# Patient Record
Sex: Female | Born: 1940
Health system: Southern US, Community
[De-identification: ages and names within clinical notes are randomized; demographics above are authoritative.]

## PROBLEM LIST (undated history)

## (undated) DIAGNOSIS — M549 Dorsalgia, unspecified: Secondary | ICD-10-CM

## (undated) DIAGNOSIS — R590 Localized enlarged lymph nodes: Secondary | ICD-10-CM

## (undated) DIAGNOSIS — M329 Systemic lupus erythematosus, unspecified: Secondary | ICD-10-CM

## (undated) DIAGNOSIS — I10 Essential (primary) hypertension: Secondary | ICD-10-CM

## (undated) DIAGNOSIS — IMO0002 Reserved for concepts with insufficient information to code with codable children: Secondary | ICD-10-CM

## (undated) DIAGNOSIS — I4891 Unspecified atrial fibrillation: Secondary | ICD-10-CM

## (undated) DIAGNOSIS — G8929 Other chronic pain: Secondary | ICD-10-CM

## (undated) DIAGNOSIS — J302 Other seasonal allergic rhinitis: Secondary | ICD-10-CM

## (undated) DIAGNOSIS — D591 Autoimmune hemolytic anemia, unspecified: Secondary | ICD-10-CM

## (undated) DIAGNOSIS — E785 Hyperlipidemia, unspecified: Secondary | ICD-10-CM

## (undated) DIAGNOSIS — F329 Major depressive disorder, single episode, unspecified: Secondary | ICD-10-CM

## (undated) DIAGNOSIS — I340 Nonrheumatic mitral (valve) insufficiency: Secondary | ICD-10-CM

## (undated) HISTORY — DX: Other autoimmune hemolytic anemias: D59.1

## (undated) HISTORY — DX: Major depressive disorder, single episode, unspecified: F32.9

## (undated) HISTORY — DX: Systemic lupus erythematosus, unspecified: M32.9

## (undated) HISTORY — DX: Dorsalgia, unspecified: M54.9

## (undated) HISTORY — DX: Unspecified atrial fibrillation: I48.91

## (undated) HISTORY — PX: EYE SURGERY: SHX253

## (undated) HISTORY — DX: Nonrheumatic mitral (valve) insufficiency: I34.0

## (undated) HISTORY — DX: Other chronic pain: G89.29

## (undated) HISTORY — DX: Autoimmune hemolytic anemia, unspecified: D59.10

## (undated) HISTORY — DX: Localized enlarged lymph nodes: R59.0

## (undated) HISTORY — DX: Reserved for concepts with insufficient information to code with codable children: IMO0002

## (undated) HISTORY — DX: Other seasonal allergic rhinitis: J30.2

## (undated) HISTORY — DX: Essential (primary) hypertension: I10

## (undated) HISTORY — DX: Hyperlipidemia, unspecified: E78.5

---

## 1999-01-22 ENCOUNTER — Emergency Department (HOSPITAL_COMMUNITY): Admission: EM | Admit: 1999-01-22 | Discharge: 1999-01-22 | Payer: Self-pay | Admitting: Emergency Medicine

## 1999-02-19 ENCOUNTER — Encounter: Admission: RE | Admit: 1999-02-19 | Discharge: 1999-02-19 | Payer: Self-pay | Admitting: Internal Medicine

## 1999-03-02 ENCOUNTER — Ambulatory Visit (HOSPITAL_COMMUNITY): Admission: RE | Admit: 1999-03-02 | Discharge: 1999-03-02 | Payer: Self-pay | Admitting: *Deleted

## 1999-03-19 ENCOUNTER — Encounter: Admission: RE | Admit: 1999-03-19 | Discharge: 1999-03-19 | Payer: Self-pay | Admitting: Internal Medicine

## 1999-04-05 ENCOUNTER — Encounter: Admission: RE | Admit: 1999-04-05 | Discharge: 1999-04-05 | Payer: Self-pay | Admitting: Internal Medicine

## 1999-08-16 ENCOUNTER — Encounter: Admission: RE | Admit: 1999-08-16 | Discharge: 1999-08-16 | Payer: Self-pay | Admitting: Internal Medicine

## 2000-01-10 ENCOUNTER — Encounter: Payer: Self-pay | Admitting: Internal Medicine

## 2000-01-10 ENCOUNTER — Ambulatory Visit (HOSPITAL_COMMUNITY): Admission: RE | Admit: 2000-01-10 | Discharge: 2000-01-10 | Payer: Self-pay | Admitting: Internal Medicine

## 2000-01-10 ENCOUNTER — Encounter: Admission: RE | Admit: 2000-01-10 | Discharge: 2000-01-10 | Payer: Self-pay | Admitting: Internal Medicine

## 2000-01-22 ENCOUNTER — Ambulatory Visit (HOSPITAL_COMMUNITY): Admission: RE | Admit: 2000-01-22 | Discharge: 2000-01-22 | Payer: Self-pay | Admitting: *Deleted

## 2000-01-26 ENCOUNTER — Encounter: Admission: RE | Admit: 2000-01-26 | Discharge: 2000-01-26 | Payer: Self-pay | Admitting: Internal Medicine

## 2000-08-16 ENCOUNTER — Encounter: Admission: RE | Admit: 2000-08-16 | Discharge: 2000-08-16 | Payer: Self-pay | Admitting: Internal Medicine

## 2000-09-19 ENCOUNTER — Encounter: Admission: RE | Admit: 2000-09-19 | Discharge: 2000-09-19 | Payer: Self-pay | Admitting: Internal Medicine

## 2000-09-30 ENCOUNTER — Encounter: Payer: Self-pay | Admitting: Internal Medicine

## 2000-09-30 ENCOUNTER — Ambulatory Visit (HOSPITAL_COMMUNITY): Admission: RE | Admit: 2000-09-30 | Discharge: 2000-09-30 | Payer: Self-pay | Admitting: Internal Medicine

## 2000-11-01 ENCOUNTER — Inpatient Hospital Stay (HOSPITAL_COMMUNITY): Admission: EM | Admit: 2000-11-01 | Discharge: 2000-11-03 | Payer: Self-pay | Admitting: Emergency Medicine

## 2000-11-01 ENCOUNTER — Encounter: Payer: Self-pay | Admitting: Emergency Medicine

## 2000-11-02 ENCOUNTER — Encounter: Payer: Self-pay | Admitting: Infectious Diseases

## 2000-11-03 ENCOUNTER — Encounter: Payer: Self-pay | Admitting: Infectious Diseases

## 2000-12-05 ENCOUNTER — Encounter: Admission: RE | Admit: 2000-12-05 | Discharge: 2000-12-05 | Payer: Self-pay | Admitting: Internal Medicine

## 2001-01-30 ENCOUNTER — Encounter: Admission: RE | Admit: 2001-01-30 | Discharge: 2001-01-30 | Payer: Self-pay | Admitting: Internal Medicine

## 2001-03-23 ENCOUNTER — Ambulatory Visit (HOSPITAL_COMMUNITY): Admission: RE | Admit: 2001-03-23 | Discharge: 2001-03-23 | Payer: Self-pay | Admitting: Internal Medicine

## 2001-04-19 ENCOUNTER — Encounter: Admission: RE | Admit: 2001-04-19 | Discharge: 2001-04-19 | Payer: Self-pay | Admitting: Internal Medicine

## 2001-07-10 ENCOUNTER — Encounter: Admission: RE | Admit: 2001-07-10 | Discharge: 2001-07-10 | Payer: Self-pay | Admitting: Internal Medicine

## 2001-08-21 ENCOUNTER — Encounter: Admission: RE | Admit: 2001-08-21 | Discharge: 2001-08-21 | Payer: Self-pay

## 2001-12-11 ENCOUNTER — Encounter: Admission: RE | Admit: 2001-12-11 | Discharge: 2001-12-11 | Payer: Self-pay | Admitting: Internal Medicine

## 2002-03-12 ENCOUNTER — Encounter: Admission: RE | Admit: 2002-03-12 | Discharge: 2002-03-12 | Payer: Self-pay | Admitting: Internal Medicine

## 2002-04-10 ENCOUNTER — Ambulatory Visit (HOSPITAL_COMMUNITY): Admission: RE | Admit: 2002-04-10 | Discharge: 2002-04-10 | Payer: Self-pay | Admitting: Internal Medicine

## 2002-04-10 ENCOUNTER — Encounter: Payer: Self-pay | Admitting: Cardiology

## 2002-04-11 ENCOUNTER — Encounter: Admission: RE | Admit: 2002-04-11 | Discharge: 2002-04-11 | Payer: Self-pay | Admitting: Obstetrics and Gynecology

## 2002-04-17 ENCOUNTER — Ambulatory Visit (HOSPITAL_COMMUNITY): Admission: RE | Admit: 2002-04-17 | Discharge: 2002-04-17 | Payer: Self-pay | Admitting: Obstetrics and Gynecology

## 2002-07-19 ENCOUNTER — Encounter: Admission: RE | Admit: 2002-07-19 | Discharge: 2002-07-19 | Payer: Self-pay | Admitting: Internal Medicine

## 2002-08-21 ENCOUNTER — Encounter: Admission: RE | Admit: 2002-08-21 | Discharge: 2002-08-21 | Payer: Self-pay | Admitting: Internal Medicine

## 2003-05-08 ENCOUNTER — Encounter: Admission: RE | Admit: 2003-05-08 | Discharge: 2003-05-08 | Payer: Self-pay | Admitting: Internal Medicine

## 2003-05-15 ENCOUNTER — Encounter: Admission: RE | Admit: 2003-05-15 | Discharge: 2003-05-15 | Payer: Self-pay | Admitting: Internal Medicine

## 2003-06-04 ENCOUNTER — Ambulatory Visit (HOSPITAL_COMMUNITY): Admission: RE | Admit: 2003-06-04 | Discharge: 2003-06-04 | Payer: Self-pay | Admitting: *Deleted

## 2003-06-04 ENCOUNTER — Encounter: Admission: RE | Admit: 2003-06-04 | Discharge: 2003-06-04 | Payer: Self-pay | Admitting: Internal Medicine

## 2003-06-12 ENCOUNTER — Encounter: Admission: RE | Admit: 2003-06-12 | Discharge: 2003-06-12 | Payer: Self-pay | Admitting: Internal Medicine

## 2003-06-19 ENCOUNTER — Ambulatory Visit (HOSPITAL_COMMUNITY): Admission: RE | Admit: 2003-06-19 | Discharge: 2003-06-19 | Payer: Self-pay | Admitting: *Deleted

## 2003-06-19 ENCOUNTER — Encounter: Payer: Self-pay | Admitting: Cardiology

## 2003-06-20 ENCOUNTER — Ambulatory Visit (HOSPITAL_COMMUNITY): Admission: RE | Admit: 2003-06-20 | Discharge: 2003-06-20 | Payer: Self-pay | Admitting: *Deleted

## 2003-08-11 ENCOUNTER — Emergency Department (HOSPITAL_COMMUNITY): Admission: EM | Admit: 2003-08-11 | Discharge: 2003-08-11 | Payer: Self-pay | Admitting: Emergency Medicine

## 2003-08-13 ENCOUNTER — Encounter: Admission: RE | Admit: 2003-08-13 | Discharge: 2003-08-13 | Payer: Self-pay | Admitting: Internal Medicine

## 2003-09-11 ENCOUNTER — Encounter: Admission: RE | Admit: 2003-09-11 | Discharge: 2003-09-11 | Payer: Self-pay | Admitting: Internal Medicine

## 2003-09-11 ENCOUNTER — Ambulatory Visit (HOSPITAL_COMMUNITY): Admission: RE | Admit: 2003-09-11 | Discharge: 2003-09-11 | Payer: Self-pay | Admitting: Internal Medicine

## 2003-12-11 ENCOUNTER — Encounter: Admission: RE | Admit: 2003-12-11 | Discharge: 2003-12-11 | Payer: Self-pay | Admitting: Internal Medicine

## 2004-02-10 ENCOUNTER — Encounter: Admission: RE | Admit: 2004-02-10 | Discharge: 2004-02-10 | Payer: Self-pay | Admitting: Internal Medicine

## 2004-03-18 ENCOUNTER — Encounter: Admission: RE | Admit: 2004-03-18 | Discharge: 2004-03-18 | Payer: Self-pay | Admitting: Internal Medicine

## 2004-06-16 ENCOUNTER — Ambulatory Visit: Payer: Self-pay | Admitting: Internal Medicine

## 2004-06-21 ENCOUNTER — Ambulatory Visit (HOSPITAL_COMMUNITY): Admission: RE | Admit: 2004-06-21 | Discharge: 2004-06-21 | Payer: Self-pay | Admitting: Internal Medicine

## 2004-08-25 ENCOUNTER — Ambulatory Visit: Payer: Self-pay | Admitting: Internal Medicine

## 2004-10-27 ENCOUNTER — Ambulatory Visit: Payer: Self-pay | Admitting: Internal Medicine

## 2004-11-11 ENCOUNTER — Ambulatory Visit: Payer: Self-pay | Admitting: Internal Medicine

## 2005-02-24 ENCOUNTER — Ambulatory Visit (HOSPITAL_COMMUNITY): Admission: RE | Admit: 2005-02-24 | Discharge: 2005-02-24 | Payer: Self-pay | Admitting: Internal Medicine

## 2005-05-26 ENCOUNTER — Ambulatory Visit: Payer: Self-pay | Admitting: Internal Medicine

## 2005-06-01 ENCOUNTER — Ambulatory Visit: Payer: Self-pay | Admitting: Internal Medicine

## 2005-07-12 ENCOUNTER — Ambulatory Visit: Payer: Self-pay | Admitting: Hospitalist

## 2005-11-16 ENCOUNTER — Ambulatory Visit: Payer: Self-pay | Admitting: Internal Medicine

## 2005-11-30 ENCOUNTER — Ambulatory Visit: Payer: Self-pay | Admitting: Internal Medicine

## 2006-02-06 ENCOUNTER — Ambulatory Visit: Payer: Self-pay | Admitting: Internal Medicine

## 2006-05-03 ENCOUNTER — Ambulatory Visit: Payer: Self-pay | Admitting: Internal Medicine

## 2006-05-09 ENCOUNTER — Ambulatory Visit (HOSPITAL_COMMUNITY): Admission: RE | Admit: 2006-05-09 | Discharge: 2006-05-09 | Payer: Self-pay | Admitting: Internal Medicine

## 2006-05-09 ENCOUNTER — Ambulatory Visit: Payer: Self-pay | Admitting: Hospitalist

## 2006-05-16 ENCOUNTER — Encounter (INDEPENDENT_AMBULATORY_CARE_PROVIDER_SITE_OTHER): Payer: Self-pay | Admitting: Cardiology

## 2006-05-16 ENCOUNTER — Ambulatory Visit (HOSPITAL_COMMUNITY): Admission: RE | Admit: 2006-05-16 | Discharge: 2006-05-16 | Payer: Self-pay | Admitting: Cardiology

## 2006-05-31 ENCOUNTER — Ambulatory Visit (HOSPITAL_COMMUNITY): Admission: RE | Admit: 2006-05-31 | Discharge: 2006-05-31 | Payer: Self-pay | Admitting: Hospitalist

## 2006-05-31 ENCOUNTER — Ambulatory Visit: Payer: Self-pay | Admitting: Hospitalist

## 2006-06-07 ENCOUNTER — Ambulatory Visit: Payer: Self-pay | Admitting: Internal Medicine

## 2006-06-21 ENCOUNTER — Ambulatory Visit: Payer: Self-pay | Admitting: *Deleted

## 2006-06-22 ENCOUNTER — Ambulatory Visit: Payer: Self-pay | Admitting: *Deleted

## 2006-06-22 ENCOUNTER — Ambulatory Visit: Payer: Self-pay | Admitting: Cardiology

## 2006-07-03 ENCOUNTER — Ambulatory Visit: Payer: Self-pay | Admitting: *Deleted

## 2006-07-03 ENCOUNTER — Ambulatory Visit: Payer: Self-pay | Admitting: Cardiology

## 2006-07-10 ENCOUNTER — Ambulatory Visit: Payer: Self-pay | Admitting: Cardiovascular Disease

## 2006-07-17 ENCOUNTER — Ambulatory Visit: Payer: Self-pay | Admitting: Cardiology

## 2006-07-20 ENCOUNTER — Ambulatory Visit: Payer: Self-pay | Admitting: *Deleted

## 2006-07-24 ENCOUNTER — Ambulatory Visit: Payer: Self-pay | Admitting: Internal Medicine

## 2006-07-24 ENCOUNTER — Ambulatory Visit: Payer: Self-pay | Admitting: Cardiovascular Disease

## 2006-08-02 DIAGNOSIS — E1169 Type 2 diabetes mellitus with other specified complication: Secondary | ICD-10-CM | POA: Insufficient documentation

## 2006-08-02 DIAGNOSIS — M329 Systemic lupus erythematosus, unspecified: Secondary | ICD-10-CM

## 2006-08-02 DIAGNOSIS — E785 Hyperlipidemia, unspecified: Secondary | ICD-10-CM

## 2006-08-02 DIAGNOSIS — M545 Low back pain: Secondary | ICD-10-CM

## 2006-08-02 DIAGNOSIS — I10 Essential (primary) hypertension: Secondary | ICD-10-CM | POA: Insufficient documentation

## 2006-08-02 DIAGNOSIS — IMO0002 Reserved for concepts with insufficient information to code with codable children: Secondary | ICD-10-CM | POA: Insufficient documentation

## 2006-08-09 ENCOUNTER — Ambulatory Visit: Payer: Self-pay | Admitting: *Deleted

## 2006-08-09 ENCOUNTER — Ambulatory Visit: Payer: Self-pay | Admitting: Cardiology

## 2006-08-25 ENCOUNTER — Ambulatory Visit: Payer: Self-pay | Admitting: Cardiology

## 2006-08-30 ENCOUNTER — Ambulatory Visit: Payer: Self-pay | Admitting: Internal Medicine

## 2006-08-31 ENCOUNTER — Ambulatory Visit (HOSPITAL_COMMUNITY): Admission: RE | Admit: 2006-08-31 | Discharge: 2006-08-31 | Payer: Self-pay | Admitting: Internal Medicine

## 2006-08-31 ENCOUNTER — Encounter (INDEPENDENT_AMBULATORY_CARE_PROVIDER_SITE_OTHER): Payer: Self-pay | Admitting: *Deleted

## 2006-09-01 ENCOUNTER — Ambulatory Visit: Payer: Self-pay | Admitting: Cardiology

## 2006-09-12 ENCOUNTER — Encounter (INDEPENDENT_AMBULATORY_CARE_PROVIDER_SITE_OTHER): Payer: Self-pay | Admitting: Internal Medicine

## 2006-09-12 ENCOUNTER — Ambulatory Visit: Payer: Self-pay | Admitting: *Deleted

## 2006-09-14 ENCOUNTER — Ambulatory Visit: Payer: Self-pay | Admitting: Internal Medicine

## 2006-09-14 ENCOUNTER — Encounter (INDEPENDENT_AMBULATORY_CARE_PROVIDER_SITE_OTHER): Payer: Self-pay | Admitting: Internal Medicine

## 2006-09-14 LAB — CONVERTED CEMR LAB
ALT: 18 units/L (ref 0–35)
Albumin: 4.1 g/dL (ref 3.5–5.2)
Alkaline Phosphatase: 43 units/L (ref 39–117)
BUN: 10 mg/dL (ref 6–23)
CO2: 28 meq/L (ref 19–32)
Glucose, Bld: 129 mg/dL — ABNORMAL HIGH (ref 70–99)
HCT: 40.7 % (ref 34.4–43.3)
MCHC: 33.6 g/dL (ref 33.1–35.4)
MCV: 94.5 fL (ref 78.8–100.0)
RBC: 4.31 M/uL (ref 3.79–4.96)
Total Bilirubin: 0.7 mg/dL (ref 0.3–1.2)
Total Protein: 7 g/dL (ref 6.0–8.3)
WBC: 3.4 10*3/uL — ABNORMAL LOW (ref 3.7–10.0)

## 2006-09-15 ENCOUNTER — Ambulatory Visit: Payer: Self-pay | Admitting: Internal Medicine

## 2006-09-21 ENCOUNTER — Ambulatory Visit: Payer: Self-pay | Admitting: Cardiology

## 2006-10-02 ENCOUNTER — Ambulatory Visit: Payer: Self-pay | Admitting: Cardiovascular Disease

## 2006-10-10 ENCOUNTER — Encounter (INDEPENDENT_AMBULATORY_CARE_PROVIDER_SITE_OTHER): Payer: Self-pay | Admitting: Internal Medicine

## 2006-10-10 ENCOUNTER — Ambulatory Visit: Payer: Self-pay | Admitting: Internal Medicine

## 2006-10-13 ENCOUNTER — Encounter: Admission: RE | Admit: 2006-10-13 | Discharge: 2006-10-13 | Payer: Self-pay | Admitting: Internal Medicine

## 2006-10-23 ENCOUNTER — Ambulatory Visit: Payer: Self-pay | Admitting: Cardiology

## 2006-10-23 ENCOUNTER — Telehealth: Payer: Self-pay | Admitting: *Deleted

## 2006-10-24 DIAGNOSIS — I340 Nonrheumatic mitral (valve) insufficiency: Secondary | ICD-10-CM

## 2006-10-24 DIAGNOSIS — I4891 Unspecified atrial fibrillation: Secondary | ICD-10-CM | POA: Insufficient documentation

## 2006-10-24 DIAGNOSIS — I059 Rheumatic mitral valve disease, unspecified: Secondary | ICD-10-CM | POA: Insufficient documentation

## 2006-10-24 HISTORY — DX: Nonrheumatic mitral (valve) insufficiency: I34.0

## 2006-11-02 ENCOUNTER — Ambulatory Visit: Payer: Self-pay | Admitting: Cardiology

## 2006-11-07 ENCOUNTER — Encounter (INDEPENDENT_AMBULATORY_CARE_PROVIDER_SITE_OTHER): Payer: Self-pay | Admitting: Internal Medicine

## 2006-11-13 ENCOUNTER — Ambulatory Visit: Payer: Self-pay | Admitting: Cardiology

## 2006-12-11 ENCOUNTER — Telehealth (INDEPENDENT_AMBULATORY_CARE_PROVIDER_SITE_OTHER): Payer: Self-pay | Admitting: Internal Medicine

## 2006-12-20 ENCOUNTER — Ambulatory Visit: Payer: Self-pay | Admitting: Cardiovascular Disease

## 2006-12-27 ENCOUNTER — Ambulatory Visit: Payer: Self-pay | Admitting: Cardiovascular Disease

## 2007-01-04 ENCOUNTER — Ambulatory Visit: Payer: Self-pay | Admitting: *Deleted

## 2007-01-04 DIAGNOSIS — R609 Edema, unspecified: Secondary | ICD-10-CM

## 2007-01-08 ENCOUNTER — Ambulatory Visit: Payer: Self-pay | Admitting: Cardiology

## 2007-01-15 ENCOUNTER — Ambulatory Visit: Payer: Self-pay | Admitting: Cardiology

## 2007-01-26 ENCOUNTER — Ambulatory Visit: Payer: Self-pay | Admitting: Cardiology

## 2007-02-06 ENCOUNTER — Ambulatory Visit: Payer: Self-pay | Admitting: Cardiology

## 2007-03-01 ENCOUNTER — Encounter (INDEPENDENT_AMBULATORY_CARE_PROVIDER_SITE_OTHER): Payer: Self-pay | Admitting: Internal Medicine

## 2007-03-01 ENCOUNTER — Ambulatory Visit: Payer: Self-pay | Admitting: *Deleted

## 2007-07-06 ENCOUNTER — Ambulatory Visit: Payer: Self-pay | Admitting: Hospitalist

## 2007-07-06 ENCOUNTER — Encounter (INDEPENDENT_AMBULATORY_CARE_PROVIDER_SITE_OTHER): Payer: Self-pay | Admitting: Internal Medicine

## 2007-07-06 LAB — CONVERTED CEMR LAB
AST: 24 units/L (ref 0–37)
Albumin: 4.1 g/dL (ref 3.5–5.2)
Basophils Absolute: 0 10*3/uL (ref 0.0–0.1)
CO2: 26 meq/L (ref 19–32)
Chloride: 109 meq/L (ref 96–112)
INR: 1.1
Lymphs Abs: 1 10*3/uL (ref 0.7–3.3)
MCV: 94.7 fL (ref 78.0–100.0)
Monocytes Absolute: 0.2 10*3/uL (ref 0.2–0.7)
Monocytes Relative: 6 % (ref 3–11)
Neutro Abs: 2.3 10*3/uL (ref 1.7–7.7)
Potassium: 3.9 meq/L (ref 3.5–5.3)
RBC: 4.71 M/uL (ref 3.87–5.11)
Total Bilirubin: 0.5 mg/dL (ref 0.3–1.2)

## 2007-07-09 ENCOUNTER — Ambulatory Visit (HOSPITAL_COMMUNITY): Admission: RE | Admit: 2007-07-09 | Discharge: 2007-07-09 | Payer: Self-pay | Admitting: Hospitalist

## 2007-07-11 ENCOUNTER — Ambulatory Visit: Payer: Self-pay | Admitting: Internal Medicine

## 2007-07-20 ENCOUNTER — Encounter (INDEPENDENT_AMBULATORY_CARE_PROVIDER_SITE_OTHER): Payer: Self-pay | Admitting: Internal Medicine

## 2007-07-20 ENCOUNTER — Ambulatory Visit: Payer: Self-pay | Admitting: *Deleted

## 2007-07-24 LAB — CONVERTED CEMR LAB
Cholesterol: 207 mg/dL — ABNORMAL HIGH (ref 0–200)
LDL Cholesterol: 136 mg/dL — ABNORMAL HIGH (ref 0–99)
VLDL: 24 mg/dL (ref 0–40)

## 2007-09-07 ENCOUNTER — Ambulatory Visit: Payer: Self-pay | Admitting: Cardiology

## 2007-09-19 ENCOUNTER — Ambulatory Visit: Payer: Self-pay | Admitting: Internal Medicine

## 2007-10-03 ENCOUNTER — Ambulatory Visit: Payer: Self-pay | Admitting: Oncology

## 2007-10-03 ENCOUNTER — Inpatient Hospital Stay (HOSPITAL_COMMUNITY): Admission: EM | Admit: 2007-10-03 | Discharge: 2007-10-08 | Payer: Self-pay | Admitting: Emergency Medicine

## 2007-10-03 ENCOUNTER — Ambulatory Visit: Payer: Self-pay | Admitting: Internal Medicine

## 2007-10-10 ENCOUNTER — Ambulatory Visit: Payer: Self-pay | Admitting: Cardiovascular Disease

## 2007-10-12 ENCOUNTER — Ambulatory Visit: Payer: Self-pay | Admitting: Infectious Disease

## 2007-10-12 DIAGNOSIS — Z862 Personal history of diseases of the blood and blood-forming organs and certain disorders involving the immune mechanism: Secondary | ICD-10-CM

## 2007-10-15 ENCOUNTER — Encounter (INDEPENDENT_AMBULATORY_CARE_PROVIDER_SITE_OTHER): Payer: Self-pay | Admitting: Internal Medicine

## 2007-10-16 ENCOUNTER — Ambulatory Visit: Payer: Self-pay | Admitting: Cardiovascular Disease

## 2007-10-16 LAB — CONVERTED CEMR LAB
BUN: 16 mg/dL (ref 6–23)
CO2: 30 meq/L (ref 19–32)
GFR calc Af Amer: 92 mL/min
Glucose, Bld: 199 mg/dL — ABNORMAL HIGH (ref 70–99)
Potassium: 3.9 meq/L (ref 3.5–5.1)
Sodium: 142 meq/L (ref 135–145)

## 2007-10-17 ENCOUNTER — Ambulatory Visit: Payer: Self-pay | Admitting: Oncology

## 2007-10-24 ENCOUNTER — Ambulatory Visit: Payer: Self-pay | Admitting: Cardiovascular Disease

## 2007-10-24 ENCOUNTER — Encounter (INDEPENDENT_AMBULATORY_CARE_PROVIDER_SITE_OTHER): Payer: Self-pay | Admitting: Internal Medicine

## 2007-10-24 LAB — BILIRUBIN, DIRECT: Bilirubin, Direct: 0.2 mg/dL (ref 0.0–0.3)

## 2007-10-24 LAB — CBC & DIFF AND RETIC
BASO%: 0.1 % (ref 0.0–2.0)
HCT: 47.6 % — ABNORMAL HIGH (ref 34.8–46.6)
LYMPH%: 3.2 % — ABNORMAL LOW (ref 14.0–48.0)
MCHC: 33.3 g/dL (ref 32.0–36.0)
MCV: 97.7 fL (ref 81.0–101.0)
MONO#: 0.4 10*3/uL (ref 0.1–0.9)
MONO%: 3.2 % (ref 0.0–13.0)
NEUT%: 93.4 % — ABNORMAL HIGH (ref 39.6–76.8)
Platelets: 208 10*3/uL (ref 145–400)
WBC: 13.4 10*3/uL — ABNORMAL HIGH (ref 3.9–10.0)

## 2007-10-24 LAB — COMPREHENSIVE METABOLIC PANEL
AST: 15 U/L (ref 0–37)
Alkaline Phosphatase: 59 U/L (ref 39–117)
BUN: 23 mg/dL (ref 6–23)
Creatinine, Ser: 0.9 mg/dL (ref 0.40–1.20)
Glucose, Bld: 204 mg/dL — ABNORMAL HIGH (ref 70–99)
Potassium: 4.3 mEq/L (ref 3.5–5.3)
Total Bilirubin: 1.1 mg/dL (ref 0.3–1.2)

## 2007-10-24 LAB — CONVERTED CEMR LAB: Prothrombin Time: 26.3 s — ABNORMAL HIGH (ref 10.9–13.3)

## 2007-10-24 LAB — MORPHOLOGY: RBC Comments: NORMAL

## 2007-11-15 ENCOUNTER — Ambulatory Visit: Payer: Self-pay | Admitting: Internal Medicine

## 2007-11-15 ENCOUNTER — Encounter (INDEPENDENT_AMBULATORY_CARE_PROVIDER_SITE_OTHER): Payer: Self-pay | Admitting: *Deleted

## 2007-11-16 LAB — CONVERTED CEMR LAB
BUN: 8 mg/dL (ref 6–23)
Basophils Relative: 0 % (ref 0–1)
Creatinine, Ser: 0.63 mg/dL (ref 0.40–1.20)
Eosinophils Absolute: 0 10*3/uL (ref 0.0–0.7)
Eosinophils Relative: 1 % (ref 0–5)
Glucose, Bld: 119 mg/dL — ABNORMAL HIGH (ref 70–99)
HCT: 29.2 % — ABNORMAL LOW (ref 36.0–46.0)
Hemoglobin: 9.2 g/dL — ABNORMAL LOW (ref 12.0–15.0)
Lymphs Abs: 0.7 10*3/uL (ref 0.7–4.0)
MCHC: 31.5 g/dL (ref 30.0–36.0)
MCV: 108.1 fL — ABNORMAL HIGH (ref 78.0–100.0)
Monocytes Absolute: 0.4 10*3/uL (ref 0.1–1.0)
Monocytes Relative: 7 % (ref 3–12)
RBC: 2.7 M/uL — ABNORMAL LOW (ref 3.87–5.11)
WBC: 5.2 10*3/uL (ref 4.0–10.5)

## 2007-11-20 ENCOUNTER — Ambulatory Visit: Payer: Self-pay | Admitting: Cardiology

## 2007-11-21 ENCOUNTER — Encounter (INDEPENDENT_AMBULATORY_CARE_PROVIDER_SITE_OTHER): Payer: Self-pay | Admitting: Internal Medicine

## 2007-11-29 ENCOUNTER — Ambulatory Visit: Payer: Self-pay | Admitting: Cardiology

## 2007-12-18 ENCOUNTER — Ambulatory Visit: Payer: Self-pay | Admitting: Infectious Diseases

## 2007-12-18 ENCOUNTER — Encounter (INDEPENDENT_AMBULATORY_CARE_PROVIDER_SITE_OTHER): Payer: Self-pay | Admitting: Internal Medicine

## 2007-12-19 ENCOUNTER — Telehealth: Payer: Self-pay | Admitting: *Deleted

## 2007-12-19 ENCOUNTER — Encounter (INDEPENDENT_AMBULATORY_CARE_PROVIDER_SITE_OTHER): Payer: Self-pay | Admitting: Internal Medicine

## 2007-12-19 LAB — CONVERTED CEMR LAB
CO2: 27 meq/L (ref 19–32)
Calcium: 9.8 mg/dL (ref 8.4–10.5)
Glucose, Bld: 159 mg/dL — ABNORMAL HIGH (ref 70–99)
MCV: 104.7 fL — ABNORMAL HIGH (ref 78.0–100.0)
Platelets: 233 10*3/uL (ref 150–400)
Potassium: 4 meq/L (ref 3.5–5.3)
RBC: 3.64 M/uL — ABNORMAL LOW (ref 3.87–5.11)
Sodium: 143 meq/L (ref 135–145)
WBC: 7 10*3/uL (ref 4.0–10.5)

## 2007-12-24 ENCOUNTER — Ambulatory Visit: Payer: Self-pay | Admitting: Hospitalist

## 2007-12-24 LAB — CONVERTED CEMR LAB: INR: 2

## 2007-12-31 ENCOUNTER — Ambulatory Visit: Payer: Self-pay | Admitting: Internal Medicine

## 2007-12-31 LAB — CONVERTED CEMR LAB: INR: 7.8

## 2008-01-07 ENCOUNTER — Ambulatory Visit: Payer: Self-pay | Admitting: Internal Medicine

## 2008-01-07 LAB — CONVERTED CEMR LAB: INR: 2.1

## 2008-01-15 ENCOUNTER — Encounter (INDEPENDENT_AMBULATORY_CARE_PROVIDER_SITE_OTHER): Payer: Self-pay | Admitting: Internal Medicine

## 2008-01-15 ENCOUNTER — Ambulatory Visit: Payer: Self-pay | Admitting: Infectious Disease

## 2008-01-21 ENCOUNTER — Ambulatory Visit: Payer: Self-pay | Admitting: Internal Medicine

## 2008-01-21 ENCOUNTER — Encounter (INDEPENDENT_AMBULATORY_CARE_PROVIDER_SITE_OTHER): Payer: Self-pay | Admitting: Internal Medicine

## 2008-01-21 LAB — CONVERTED CEMR LAB
HDL: 60 mg/dL (ref >39)
LDL Cholesterol: 102 mg/dL — ABNORMAL HIGH (ref 0–99)

## 2008-01-22 ENCOUNTER — Encounter (INDEPENDENT_AMBULATORY_CARE_PROVIDER_SITE_OTHER): Payer: Self-pay | Admitting: *Deleted

## 2008-01-22 ENCOUNTER — Ambulatory Visit: Payer: Self-pay | Admitting: Hospitalist

## 2008-01-22 LAB — CONVERTED CEMR LAB
AST: 25 units/L (ref 0–37)
Albumin: 4.1 g/dL (ref 3.5–5.2)
BUN: 9 mg/dL (ref 6–23)
CO2: 25 meq/L (ref 19–32)
Calcium: 9 mg/dL (ref 8.4–10.5)
Chloride: 98 meq/L (ref 96–112)
Creatinine, Ser: 0.77 mg/dL (ref 0.40–1.20)
Glucose, Bld: 179 mg/dL — ABNORMAL HIGH (ref 70–99)
Hemoglobin: 13 g/dL (ref 12.0–15.0)
Lipase: 15 units/L (ref 11–59)
Lymphocytes Relative: 6 % — ABNORMAL LOW (ref 12–46)
Lymphs Abs: 0.8 10*3/uL (ref 0.7–4.0)
Monocytes Absolute: 0.6 10*3/uL (ref 0.1–1.0)
Monocytes Relative: 5 % (ref 3–12)
Neutro Abs: 12.1 10*3/uL — ABNORMAL HIGH (ref 1.7–7.7)
Neutrophils Relative %: 89 % — ABNORMAL HIGH (ref 43–77)
Potassium: 4.3 meq/L (ref 3.5–5.3)
RBC: 4.11 M/uL (ref 3.87–5.11)
WBC: 13.5 10*3/uL — ABNORMAL HIGH (ref 4.0–10.5)

## 2008-01-24 ENCOUNTER — Ambulatory Visit: Payer: Self-pay | Admitting: Infectious Disease

## 2008-02-04 ENCOUNTER — Ambulatory Visit: Payer: Self-pay | Admitting: Internal Medicine

## 2008-02-04 ENCOUNTER — Telehealth: Payer: Self-pay

## 2008-02-04 ENCOUNTER — Encounter: Admission: RE | Admit: 2008-02-04 | Discharge: 2008-02-04 | Payer: Self-pay | Admitting: Internal Medicine

## 2008-02-04 ENCOUNTER — Inpatient Hospital Stay (HOSPITAL_COMMUNITY): Admission: AD | Admit: 2008-02-04 | Discharge: 2008-02-07 | Payer: Self-pay | Admitting: Internal Medicine

## 2008-02-04 ENCOUNTER — Ambulatory Visit: Payer: Self-pay | Admitting: Oncology

## 2008-02-04 ENCOUNTER — Encounter (INDEPENDENT_AMBULATORY_CARE_PROVIDER_SITE_OTHER): Payer: Self-pay | Admitting: Internal Medicine

## 2008-02-11 ENCOUNTER — Ambulatory Visit: Payer: Self-pay | Admitting: Oncology

## 2008-02-13 ENCOUNTER — Ambulatory Visit (HOSPITAL_COMMUNITY): Admission: RE | Admit: 2008-02-13 | Discharge: 2008-02-13 | Payer: Self-pay | Admitting: Oncology

## 2008-02-13 ENCOUNTER — Encounter (INDEPENDENT_AMBULATORY_CARE_PROVIDER_SITE_OTHER): Payer: Self-pay | Admitting: Internal Medicine

## 2008-02-27 ENCOUNTER — Ambulatory Visit (HOSPITAL_COMMUNITY): Admission: RE | Admit: 2008-02-27 | Discharge: 2008-02-27 | Payer: Self-pay | Admitting: *Deleted

## 2008-02-27 ENCOUNTER — Encounter (INDEPENDENT_AMBULATORY_CARE_PROVIDER_SITE_OTHER): Payer: Self-pay | Admitting: *Deleted

## 2008-02-27 ENCOUNTER — Ambulatory Visit: Payer: Self-pay | Admitting: *Deleted

## 2008-02-27 DIAGNOSIS — E119 Type 2 diabetes mellitus without complications: Secondary | ICD-10-CM | POA: Insufficient documentation

## 2008-02-28 ENCOUNTER — Encounter: Admission: RE | Admit: 2008-02-28 | Discharge: 2008-02-28 | Payer: Self-pay | Admitting: Internal Medicine

## 2008-03-02 ENCOUNTER — Inpatient Hospital Stay (HOSPITAL_COMMUNITY): Admission: EM | Admit: 2008-03-02 | Discharge: 2008-03-07 | Payer: Self-pay | Admitting: Emergency Medicine

## 2008-03-02 ENCOUNTER — Ambulatory Visit: Payer: Self-pay | Admitting: Internal Medicine

## 2008-03-03 ENCOUNTER — Encounter (INDEPENDENT_AMBULATORY_CARE_PROVIDER_SITE_OTHER): Payer: Self-pay | Admitting: *Deleted

## 2008-03-10 ENCOUNTER — Telehealth (INDEPENDENT_AMBULATORY_CARE_PROVIDER_SITE_OTHER): Payer: Self-pay | Admitting: Internal Medicine

## 2008-03-12 ENCOUNTER — Encounter (INDEPENDENT_AMBULATORY_CARE_PROVIDER_SITE_OTHER): Payer: Self-pay | Admitting: Internal Medicine

## 2008-03-12 LAB — CBC WITH DIFFERENTIAL/PLATELET
EOS%: 0.4 % (ref 0.0–7.0)
MCH: 32.6 pg (ref 26.0–34.0)
MCV: 97.5 fL (ref 81.0–101.0)
MONO%: 6.4 % (ref 0.0–13.0)
NEUT#: 6.3 10*3/uL (ref 1.5–6.5)
RBC: 3.9 10*6/uL (ref 3.70–5.32)
RDW: 18.2 % — ABNORMAL HIGH (ref 11.3–14.5)
lymph#: 1.2 10*3/uL (ref 0.9–3.3)

## 2008-03-14 ENCOUNTER — Ambulatory Visit: Payer: Self-pay | Admitting: Internal Medicine

## 2008-03-14 ENCOUNTER — Encounter (INDEPENDENT_AMBULATORY_CARE_PROVIDER_SITE_OTHER): Payer: Self-pay | Admitting: Internal Medicine

## 2008-03-14 LAB — CONVERTED CEMR LAB
Blood Glucose, Fingerstick: 244
Hgb A1c MFr Bld: 9.3 %

## 2008-03-15 LAB — CONVERTED CEMR LAB
ALT: 58 units/L — ABNORMAL HIGH (ref 0–35)
Alkaline Phosphatase: 113 units/L (ref 39–117)
Basophils Absolute: 0 10*3/uL (ref 0.0–0.1)
CO2: 28 meq/L (ref 19–32)
Eosinophils Relative: 0 % (ref 0–5)
HCT: 35.9 % — ABNORMAL LOW (ref 36.0–46.0)
Lymphocytes Relative: 5 % — ABNORMAL LOW (ref 12–46)
Neutrophils Relative %: 93 % — ABNORMAL HIGH (ref 43–77)
Platelets: 149 10*3/uL — ABNORMAL LOW (ref 150–400)
Potassium: 4.7 meq/L (ref 3.5–5.3)
RDW: 18.3 % — ABNORMAL HIGH (ref 11.5–15.5)
Sodium: 140 meq/L (ref 135–145)
Total Bilirubin: 0.8 mg/dL (ref 0.3–1.2)
Total Protein: 5.5 g/dL — ABNORMAL LOW (ref 6.0–8.3)

## 2008-03-18 ENCOUNTER — Ambulatory Visit: Payer: Self-pay | Admitting: Internal Medicine

## 2008-03-19 LAB — CBC & DIFF AND RETIC
Basophils Absolute: 0 10*3/uL (ref 0.0–0.1)
Eosinophils Absolute: 0 10*3/uL (ref 0.0–0.5)
HGB: 12.6 g/dL (ref 11.6–15.9)
IRF: 0.53 — ABNORMAL HIGH (ref 0.130–0.330)
MONO#: 0.5 10*3/uL (ref 0.1–0.9)
MONO%: 5.4 % (ref 0.0–13.0)
NEUT#: 7.5 10*3/uL — ABNORMAL HIGH (ref 1.5–6.5)
RBC: 3.84 10*6/uL (ref 3.70–5.32)
RDW: 18 % — ABNORMAL HIGH (ref 11.3–14.5)
RETIC #: 107.5 10*3/uL (ref 19.7–115.1)
Retic %: 2.8 % — ABNORMAL HIGH (ref 0.4–2.3)
WBC: 9.1 10*3/uL (ref 3.9–10.0)
lymph#: 1.1 10*3/uL (ref 0.9–3.3)

## 2008-03-25 ENCOUNTER — Telehealth (INDEPENDENT_AMBULATORY_CARE_PROVIDER_SITE_OTHER): Payer: Self-pay | Admitting: Internal Medicine

## 2008-03-31 ENCOUNTER — Ambulatory Visit: Payer: Self-pay | Admitting: Internal Medicine

## 2008-03-31 ENCOUNTER — Ambulatory Visit: Payer: Self-pay | Admitting: Oncology

## 2008-03-31 LAB — CONVERTED CEMR LAB: Blood Glucose, Home Monitor: 4 mg/dL

## 2008-04-02 LAB — CBC & DIFF AND RETIC
Basophils Absolute: 0 10*3/uL (ref 0.0–0.1)
EOS%: 0.2 % (ref 0.0–7.0)
HCT: 37.4 % (ref 34.8–46.6)
HGB: 12.6 g/dL (ref 11.6–15.9)
IRF: 0.47 — ABNORMAL HIGH (ref 0.130–0.330)
MCH: 32.8 pg (ref 26.0–34.0)
MONO#: 0.5 10*3/uL (ref 0.1–0.9)
NEUT#: 8.2 10*3/uL — ABNORMAL HIGH (ref 1.5–6.5)
RDW: 17.8 % — ABNORMAL HIGH (ref 11.3–14.5)
RETIC #: 113.7 10*3/uL (ref 19.7–115.1)
WBC: 10 10*3/uL (ref 3.9–10.0)
lymph#: 1.3 10*3/uL (ref 0.9–3.3)

## 2008-04-07 ENCOUNTER — Telehealth: Payer: Self-pay | Admitting: *Deleted

## 2008-04-07 ENCOUNTER — Encounter (INDEPENDENT_AMBULATORY_CARE_PROVIDER_SITE_OTHER): Payer: Self-pay | Admitting: Internal Medicine

## 2008-04-09 ENCOUNTER — Encounter (INDEPENDENT_AMBULATORY_CARE_PROVIDER_SITE_OTHER): Payer: Self-pay | Admitting: Internal Medicine

## 2008-04-09 LAB — CBC & DIFF AND RETIC
BASO%: 0.1 % (ref 0.0–2.0)
EOS%: 0.3 % (ref 0.0–7.0)
HGB: 11.7 g/dL (ref 11.6–15.9)
IRF: 0.45 — ABNORMAL HIGH (ref 0.130–0.330)
MCH: 33.2 pg (ref 26.0–34.0)
MCHC: 33.9 g/dL (ref 32.0–36.0)
MONO#: 0.5 10*3/uL (ref 0.1–0.9)
RDW: 17.2 % — ABNORMAL HIGH (ref 11.3–14.5)
RETIC #: 105.7 10*3/uL (ref 19.7–115.1)
WBC: 7.1 10*3/uL (ref 3.9–10.0)
lymph#: 0.8 10*3/uL — ABNORMAL LOW (ref 0.9–3.3)

## 2008-04-10 ENCOUNTER — Telehealth (INDEPENDENT_AMBULATORY_CARE_PROVIDER_SITE_OTHER): Payer: Self-pay | Admitting: Internal Medicine

## 2008-04-16 ENCOUNTER — Encounter (INDEPENDENT_AMBULATORY_CARE_PROVIDER_SITE_OTHER): Payer: Self-pay | Admitting: Internal Medicine

## 2008-04-16 LAB — CBC & DIFF AND RETIC
BASO%: 0.3 % (ref 0.0–2.0)
EOS%: 0.8 % (ref 0.0–7.0)
MCH: 32.4 pg (ref 26.0–34.0)
MCHC: 33.2 g/dL (ref 32.0–36.0)
MCV: 97.6 fL (ref 81.0–101.0)
MONO%: 9.1 % (ref 0.0–13.0)
RBC: 3.84 10*6/uL (ref 3.70–5.32)
RDW: 16.5 % — ABNORMAL HIGH (ref 11.3–14.5)
RETIC #: 109.1 10*3/uL (ref 19.7–115.1)
Retic %: 2.8 % — ABNORMAL HIGH (ref 0.4–2.3)

## 2008-04-17 ENCOUNTER — Ambulatory Visit: Payer: Self-pay | Admitting: Infectious Diseases

## 2008-04-17 ENCOUNTER — Encounter (INDEPENDENT_AMBULATORY_CARE_PROVIDER_SITE_OTHER): Payer: Self-pay | Admitting: Internal Medicine

## 2008-04-30 LAB — CBC & DIFF AND RETIC
Basophils Absolute: 0 10*3/uL (ref 0.0–0.1)
EOS%: 1.3 % (ref 0.0–7.0)
Eosinophils Absolute: 0.1 10*3/uL (ref 0.0–0.5)
HGB: 12.7 g/dL (ref 11.6–15.9)
IRF: 0.47 — ABNORMAL HIGH (ref 0.130–0.330)
MCV: 99.1 fL (ref 81.0–101.0)
MONO%: 7.9 % (ref 0.0–13.0)
NEUT#: 5.9 10*3/uL (ref 1.5–6.5)
RBC: 3.8 10*6/uL (ref 3.70–5.32)
RDW: 15.7 % — ABNORMAL HIGH (ref 11.3–14.5)
RETIC #: 89.7 10*3/uL (ref 19.7–115.1)
Retic %: 2.4 % — ABNORMAL HIGH (ref 0.4–2.3)
lymph#: 1.3 10*3/uL (ref 0.9–3.3)

## 2008-05-09 ENCOUNTER — Encounter (INDEPENDENT_AMBULATORY_CARE_PROVIDER_SITE_OTHER): Payer: Self-pay | Admitting: Internal Medicine

## 2008-05-09 LAB — CBC & DIFF AND RETIC
Eosinophils Absolute: 0.1 10*3/uL (ref 0.0–0.5)
LYMPH%: 11.3 % — ABNORMAL LOW (ref 14.0–48.0)
MCV: 97.3 fL (ref 81.0–101.0)
MONO%: 0.8 % (ref 0.0–13.0)
NEUT#: 6.1 10*3/uL (ref 1.5–6.5)
NEUT%: 87.1 % — ABNORMAL HIGH (ref 39.6–76.8)
Platelets: 197 10*3/uL (ref 145–400)
RBC: 4.1 10*6/uL (ref 3.70–5.32)
Retic %: 2.3 % (ref 0.4–2.3)

## 2008-05-15 ENCOUNTER — Ambulatory Visit: Payer: Self-pay | Admitting: *Deleted

## 2008-05-18 ENCOUNTER — Ambulatory Visit: Payer: Self-pay | Admitting: Oncology

## 2008-05-21 LAB — CBC & DIFF AND RETIC
Basophils Absolute: 0 10*3/uL (ref 0.0–0.1)
Eosinophils Absolute: 0.2 10*3/uL (ref 0.0–0.5)
HCT: 41.7 % (ref 34.8–46.6)
HGB: 13.9 g/dL (ref 11.6–15.9)
LYMPH%: 20.2 % (ref 14.0–48.0)
MCV: 96.9 fL (ref 81.0–101.0)
MONO%: 8.2 % (ref 0.0–13.0)
NEUT#: 4.9 10*3/uL (ref 1.5–6.5)
NEUT%: 68.9 % (ref 39.6–76.8)
Platelets: 187 10*3/uL (ref 145–400)
RBC: 4.31 10*6/uL (ref 3.70–5.32)

## 2008-05-28 ENCOUNTER — Telehealth (INDEPENDENT_AMBULATORY_CARE_PROVIDER_SITE_OTHER): Payer: Self-pay | Admitting: Internal Medicine

## 2008-05-28 ENCOUNTER — Encounter (INDEPENDENT_AMBULATORY_CARE_PROVIDER_SITE_OTHER): Payer: Self-pay | Admitting: Internal Medicine

## 2008-05-28 LAB — CBC & DIFF AND RETIC
Basophils Absolute: 0 10*3/uL (ref 0.0–0.1)
Eosinophils Absolute: 0.1 10*3/uL (ref 0.0–0.5)
HCT: 43 % (ref 34.8–46.6)
HGB: 14.3 g/dL (ref 11.6–15.9)
LYMPH%: 12.6 % — ABNORMAL LOW (ref 14.0–48.0)
MCV: 97 fL (ref 81.0–101.0)
MONO#: 0.7 10*3/uL (ref 0.1–0.9)
MONO%: 8.4 % (ref 0.0–13.0)
NEUT#: 6.1 10*3/uL (ref 1.5–6.5)
Platelets: 179 10*3/uL (ref 145–400)
WBC: 7.8 10*3/uL (ref 3.9–10.0)

## 2008-05-28 LAB — COMPREHENSIVE METABOLIC PANEL
AST: 14 U/L (ref 0–37)
Albumin: 4.3 g/dL (ref 3.5–5.2)
Alkaline Phosphatase: 58 U/L (ref 39–117)
BUN: 10 mg/dL (ref 6–23)
Glucose, Bld: 149 mg/dL — ABNORMAL HIGH (ref 70–99)
Potassium: 4.2 mEq/L (ref 3.5–5.3)
Sodium: 144 mEq/L (ref 135–145)
Total Bilirubin: 0.5 mg/dL (ref 0.3–1.2)

## 2008-05-28 LAB — MORPHOLOGY

## 2008-05-28 LAB — LACTATE DEHYDROGENASE: LDH: 195 U/L (ref 94–250)

## 2008-05-29 ENCOUNTER — Telehealth: Payer: Self-pay | Admitting: *Deleted

## 2008-06-04 LAB — CBC & DIFF AND RETIC
Basophils Absolute: 0 10*3/uL (ref 0.0–0.1)
Eosinophils Absolute: 0.2 10*3/uL (ref 0.0–0.5)
HGB: 14.5 g/dL (ref 11.6–15.9)
IRF: 0.43 — ABNORMAL HIGH (ref 0.130–0.330)
MCV: 96.9 fL (ref 81.0–101.0)
MONO#: 0.6 10*3/uL (ref 0.1–0.9)
MONO%: 8.4 % (ref 0.0–13.0)
NEUT#: 4.9 10*3/uL (ref 1.5–6.5)
RDW: 14.4 % (ref 11.3–14.5)
RETIC #: 72.9 10*3/uL (ref 19.7–115.1)

## 2008-06-06 ENCOUNTER — Telehealth (INDEPENDENT_AMBULATORY_CARE_PROVIDER_SITE_OTHER): Payer: Self-pay | Admitting: Internal Medicine

## 2008-06-17 ENCOUNTER — Encounter (INDEPENDENT_AMBULATORY_CARE_PROVIDER_SITE_OTHER): Payer: Self-pay | Admitting: Internal Medicine

## 2008-06-17 LAB — CBC & DIFF AND RETIC
Basophils Absolute: 0 10*3/uL (ref 0.0–0.1)
Eosinophils Absolute: 0.1 10*3/uL (ref 0.0–0.5)
HGB: 14 g/dL (ref 11.6–15.9)
IRF: 0.46 — ABNORMAL HIGH (ref 0.130–0.330)
NEUT#: 5.8 10*3/uL (ref 1.5–6.5)
RDW: 14.5 % (ref 11.3–14.5)
RETIC #: 76.3 10*3/uL (ref 19.7–115.1)
lymph#: 0.9 10*3/uL (ref 0.9–3.3)

## 2008-06-17 LAB — CHCC SMEAR

## 2008-06-17 LAB — MORPHOLOGY: PLT EST: ADEQUATE

## 2008-06-18 ENCOUNTER — Encounter (INDEPENDENT_AMBULATORY_CARE_PROVIDER_SITE_OTHER): Payer: Self-pay | Admitting: Internal Medicine

## 2008-06-18 LAB — COMPREHENSIVE METABOLIC PANEL
ALT: 24 U/L (ref 0–35)
AST: 19 U/L (ref 0–37)
Albumin: 4.2 g/dL (ref 3.5–5.2)
Calcium: 9.9 mg/dL (ref 8.4–10.5)
Chloride: 108 mEq/L (ref 96–112)
Creatinine, Ser: 0.83 mg/dL (ref 0.40–1.20)
Potassium: 3.8 mEq/L (ref 3.5–5.3)

## 2008-08-04 ENCOUNTER — Telehealth (INDEPENDENT_AMBULATORY_CARE_PROVIDER_SITE_OTHER): Payer: Self-pay | Admitting: Internal Medicine

## 2008-08-08 ENCOUNTER — Ambulatory Visit: Payer: Self-pay | Admitting: Oncology

## 2008-08-11 ENCOUNTER — Ambulatory Visit: Payer: Self-pay | Admitting: Internal Medicine

## 2008-08-11 LAB — CONVERTED CEMR LAB: Blood Glucose, Fingerstick: 148

## 2008-08-12 LAB — CBC & DIFF AND RETIC
Basophils Absolute: 0 10*3/uL (ref 0.0–0.1)
Eosinophils Absolute: 0.1 10*3/uL (ref 0.0–0.5)
HGB: 14.6 g/dL (ref 11.6–15.9)
LYMPH%: 19.9 % (ref 14.0–48.0)
MCV: 94.4 fL (ref 81.0–101.0)
MONO%: 6 % (ref 0.0–13.0)
NEUT#: 4.1 10*3/uL (ref 1.5–6.5)
Platelets: 145 10*3/uL (ref 145–400)
RDW: 14.4 % (ref 11.3–14.5)
RETIC #: 68.4 10*3/uL (ref 19.7–115.1)

## 2008-08-26 ENCOUNTER — Ambulatory Visit: Payer: Self-pay | Admitting: Internal Medicine

## 2008-08-26 ENCOUNTER — Encounter (INDEPENDENT_AMBULATORY_CARE_PROVIDER_SITE_OTHER): Payer: Self-pay | Admitting: Internal Medicine

## 2008-08-26 LAB — CONVERTED CEMR LAB: Blood Glucose, Fingerstick: 98

## 2008-09-01 LAB — CONVERTED CEMR LAB
Chloride: 108 meq/L (ref 96–112)
HDL: 63 mg/dL (ref >39)
LDL Cholesterol: 93 mg/dL (ref 0–99)
Potassium: 4.4 meq/L (ref 3.5–5.3)
Total CHOL/HDL Ratio: 2.7
Triglycerides: 83 mg/dL (ref <150)
VLDL: 17 mg/dL (ref 0–40)

## 2008-10-02 ENCOUNTER — Ambulatory Visit: Payer: Self-pay | Admitting: Oncology

## 2008-10-07 LAB — CBC & DIFF AND RETIC
Basophils Absolute: 0 10*3/uL (ref 0.0–0.1)
Eosinophils Absolute: 0.1 10*3/uL (ref 0.0–0.5)
HGB: 14.4 g/dL (ref 11.6–15.9)
LYMPH%: 23.7 % (ref 14.0–48.0)
MCV: 95.6 fL (ref 81.0–101.0)
MONO%: 9.2 % (ref 0.0–13.0)
NEUT#: 3.6 10*3/uL (ref 1.5–6.5)
NEUT%: 65.3 % (ref 39.6–76.8)
Platelets: 150 10*3/uL (ref 145–400)

## 2008-10-21 ENCOUNTER — Encounter (INDEPENDENT_AMBULATORY_CARE_PROVIDER_SITE_OTHER): Payer: Self-pay | Admitting: Internal Medicine

## 2008-11-28 ENCOUNTER — Ambulatory Visit: Payer: Self-pay | Admitting: Oncology

## 2008-12-25 ENCOUNTER — Ambulatory Visit: Payer: Self-pay | Admitting: Internal Medicine

## 2008-12-25 LAB — CONVERTED CEMR LAB
Albumin: 4.7 g/dL (ref 3.5–5.2)
Alkaline Phosphatase: 58 units/L (ref 39–117)
BUN: 10 mg/dL (ref 6–23)
GFR calc non Af Amer: >60 mL/min (ref >60)
Glucose, Bld: 60 mg/dL — ABNORMAL LOW (ref 70–99)
Microalb Creat Ratio: 8.7 mg/g (ref 0.0–30.0)
Microalb, Ur: 1.85 mg/dL (ref 0.00–1.89)
TSH: 0.41 microintl units/mL (ref 0.350–4.500)
Total Bilirubin: 0.7 mg/dL (ref 0.3–1.2)

## 2009-01-07 IMAGING — CT CT ABDOMEN W/O CM
2 of 4 series · 17 of 46 positions shown, 19 images · non-contrast
Comparison: None.

ABDOMEN CT WITHOUT CONTRAST:

CLINICAL DATA: Decreasing hemoglobin. Weakness. Urinary tract infection.
TECHNIQUE: Multidetector CT imaging of the abdomen and pelvis was performed
following the standard protocol without IV contrast.

[Series 2: abd/pelv w/o 5.0 b31f st · axial · non-contrast · 0.92mm/px · z∈[-603,-153]mm · 14 of 100 slices shown, 16 images]
[im 5/100  soft-tissue]
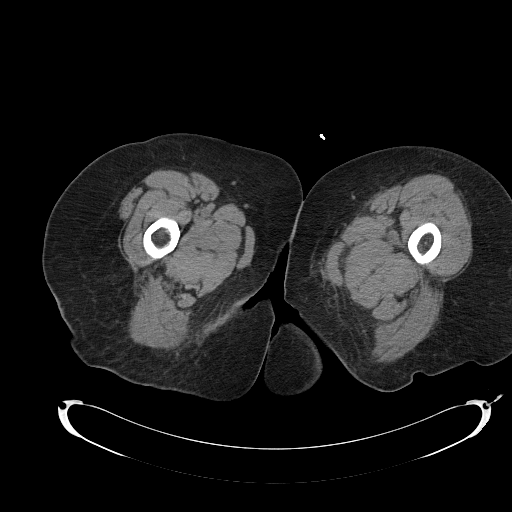
[im 5/100  bone]
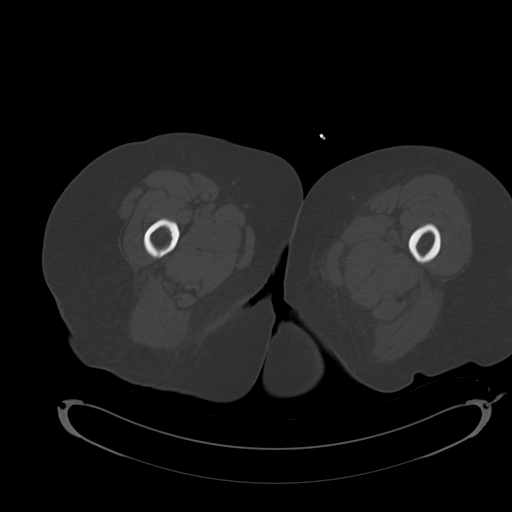
[im 13/100  soft-tissue]
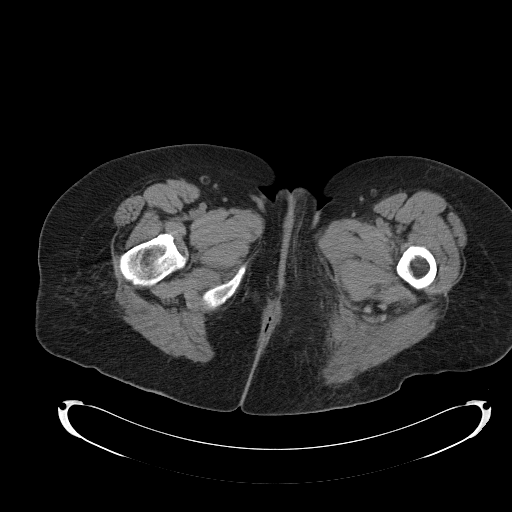
[im 21/100  soft-tissue]
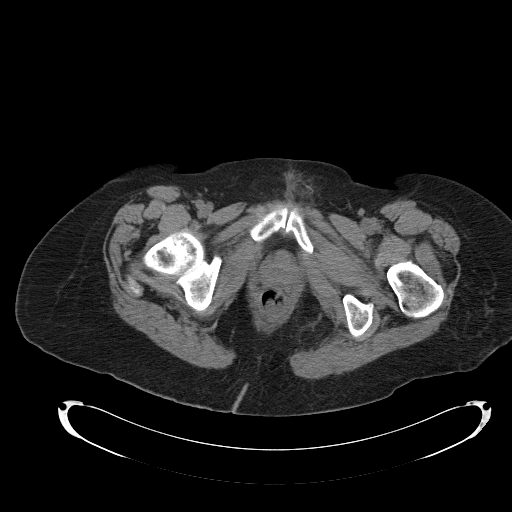
[im 25/100  soft-tissue]
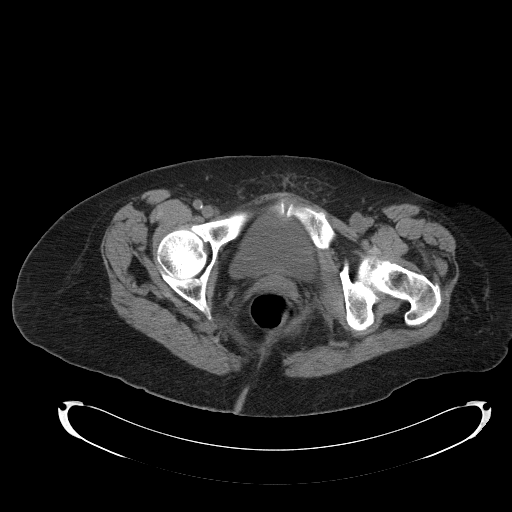
[im 34/100  soft-tissue]
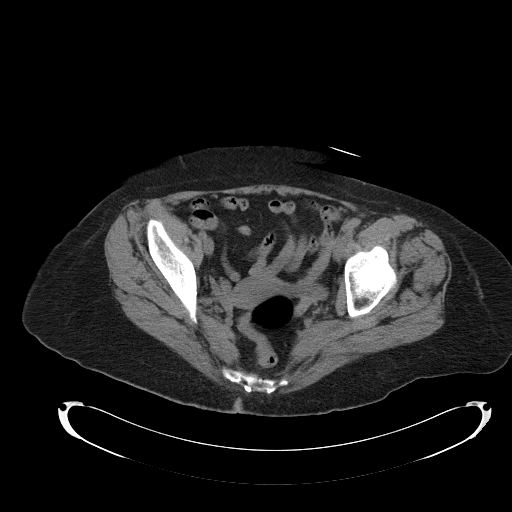
[im 42/100  soft-tissue]
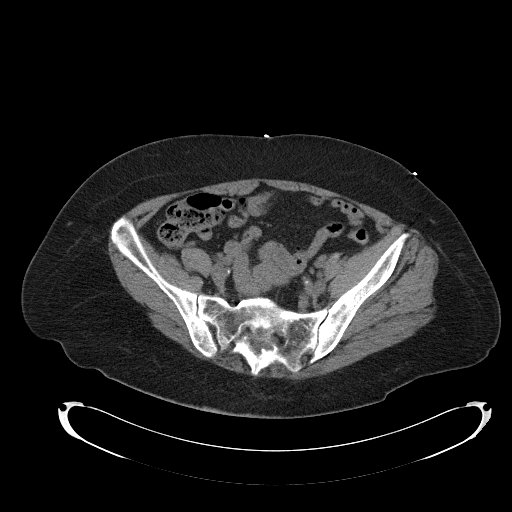
[im 46/100  soft-tissue]
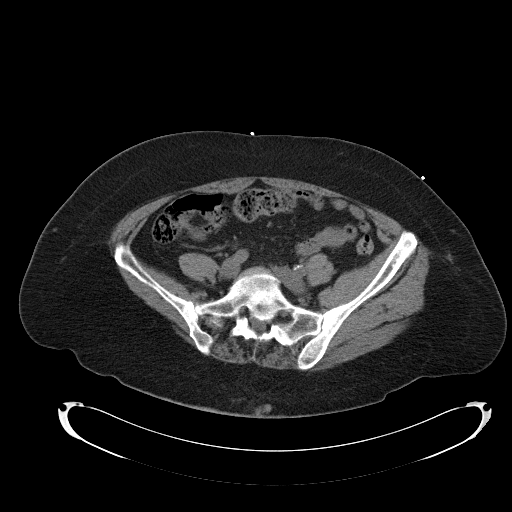
[im 54/100  soft-tissue]
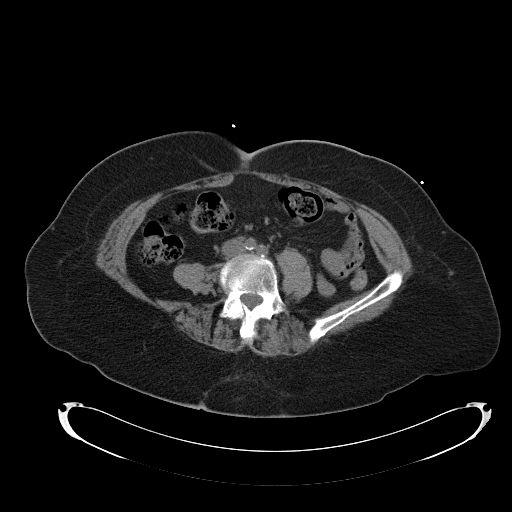
[im 58/100  soft-tissue]
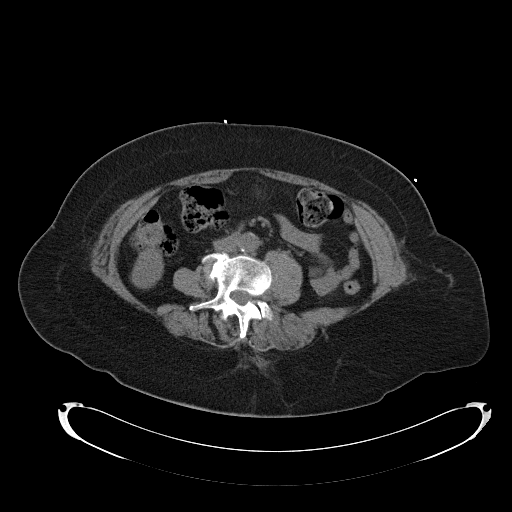
[im 58/100  bone]
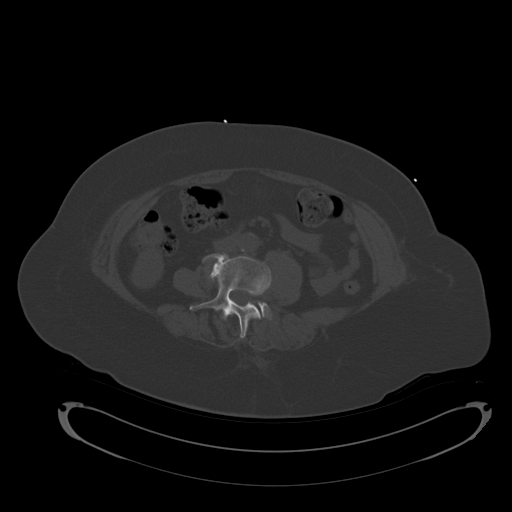
[im 67/100  soft-tissue]
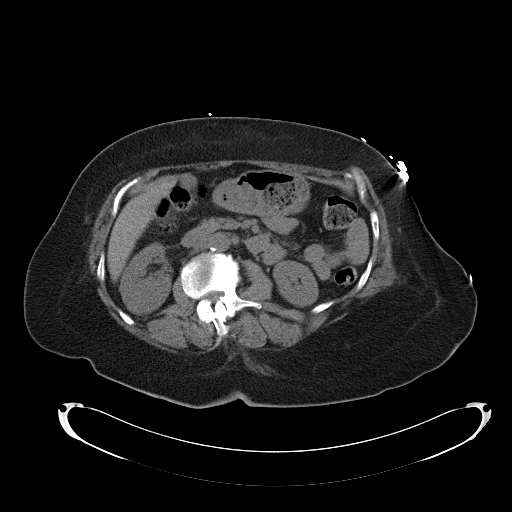
[im 75/100  soft-tissue]
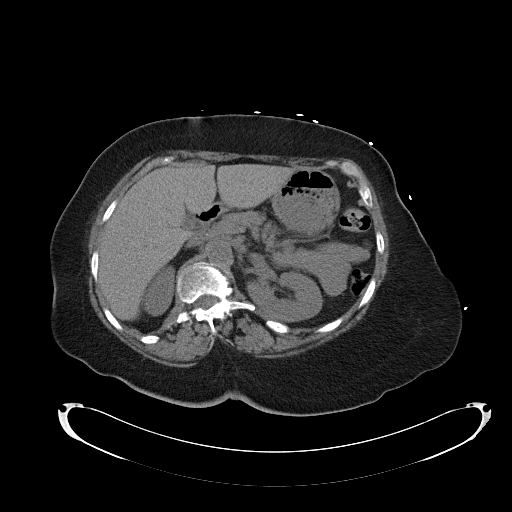
[im 79/100  soft-tissue]
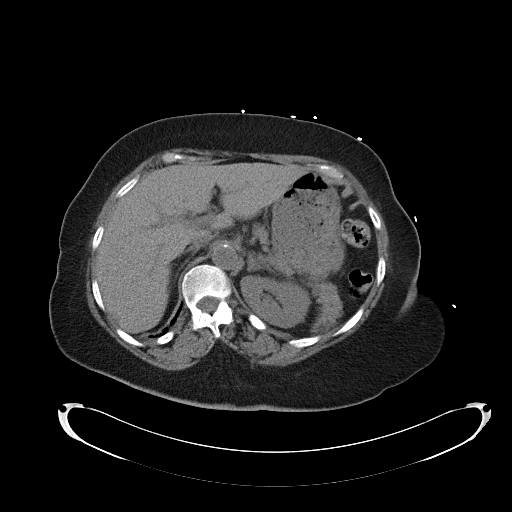
[im 87/100  soft-tissue]
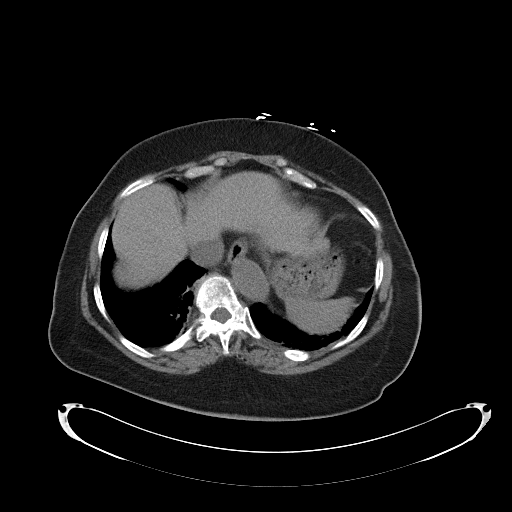
[im 95/100  soft-tissue]
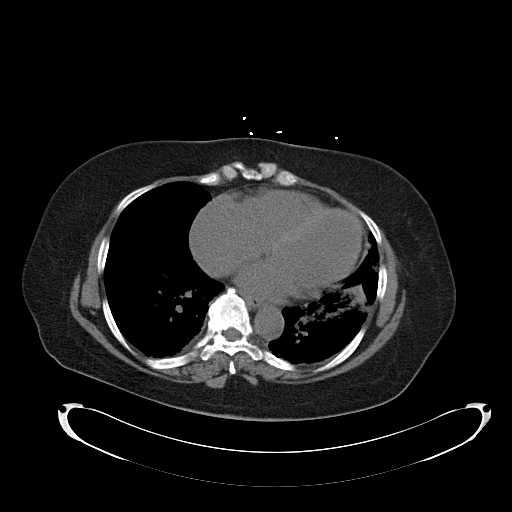

[Series 603: cor a/p · coronal · 0.98mm/px · 3 of 75 slices shown]
[im 25/75  soft-tissue]
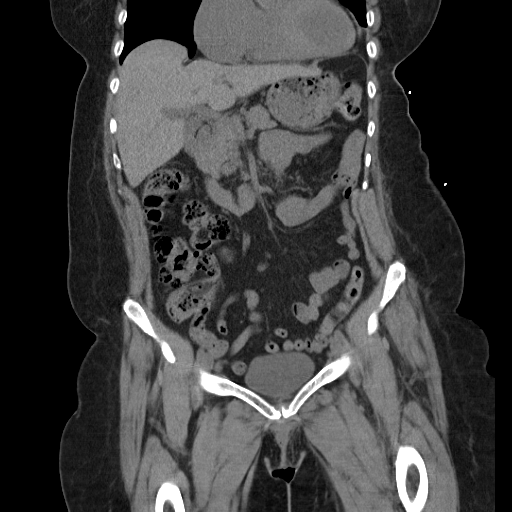
[im 33/75  soft-tissue]
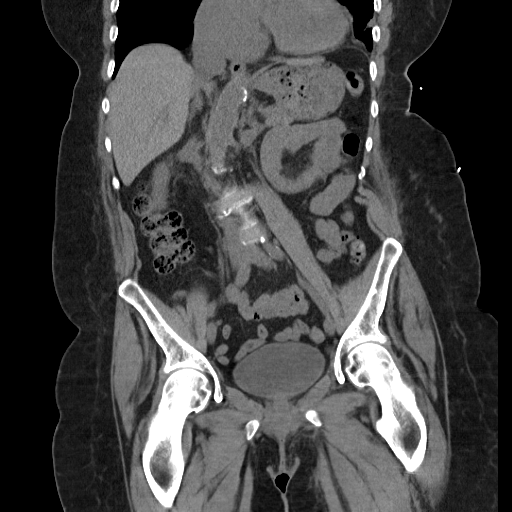
[im 42/75  soft-tissue]
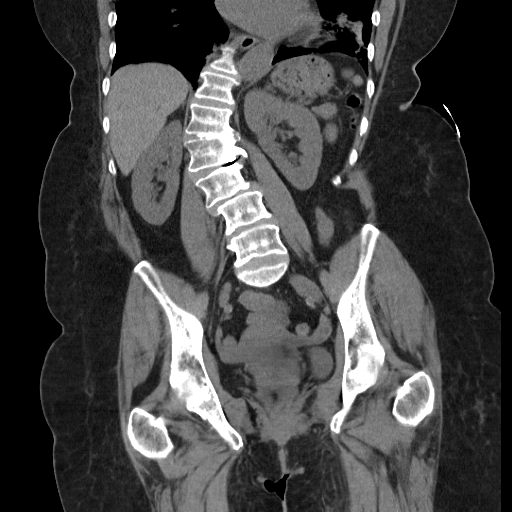

[17 of 46 positions shown; findings below may reference images not displayed]

FINDINGS: Peripheral right lower lobe pulmonary nodule, associated with the major fissure,
is incompletely visualized. 7 mm pulmonary nodule seen in the left lung base on
image 11. There is left lower lobe collapse/consolidation.

No focal abnormality is seen in the liver or spleen on this study performed
without intravenous contrast. The stomach, duodenum, pancreas, gallbladder, and
adrenal glands are unremarkable. No renal stones. No hydronephrosis. No
secondary change in either kidney.
IMPRESSION: No acute findings in the abdomen.

Bilateral small pulmonary nodules. These may be related atelectasis or scarring,
but followup is suggested. If the patient has no history of smoking or
malignancy, followup CT chest without contrast in 6 to 12 months could be used
to ensure stability.  In the setting of a cancer or smoking history, 3 month CT
followup is recommended..

PELVIS CT WITHOUT CONTRAST:
FINDINGS: No intraperitoneal free fluid. No adnexal mass. Bladder is nondistended. No
pelvic lymphadenopathy.

There is no retroperitoneal hemorrhage. Slight asymmetry of the psoas muscles is
secondary to the thoracolumbar scoliosis. The terminal ileum and the appendix
are normal. Degenerative changes seen in the thoracolumbar spine with convex
rightward thoracolumbar scoliosis.
IMPRESSION: No acute findings. No evidence for retroperitoneal hematoma.

## 2009-01-29 ENCOUNTER — Ambulatory Visit: Payer: Self-pay | Admitting: Internal Medicine

## 2009-01-29 ENCOUNTER — Encounter (INDEPENDENT_AMBULATORY_CARE_PROVIDER_SITE_OTHER): Payer: Self-pay | Admitting: Internal Medicine

## 2009-01-29 DIAGNOSIS — D4959 Neoplasm of unspecified behavior of other genitourinary organ: Secondary | ICD-10-CM

## 2009-01-30 ENCOUNTER — Ambulatory Visit: Payer: Self-pay | Admitting: Oncology

## 2009-02-03 ENCOUNTER — Encounter: Payer: Self-pay | Admitting: Cardiovascular Disease

## 2009-02-03 ENCOUNTER — Encounter (INDEPENDENT_AMBULATORY_CARE_PROVIDER_SITE_OTHER): Payer: Self-pay | Admitting: Internal Medicine

## 2009-05-05 ENCOUNTER — Encounter: Payer: Self-pay | Admitting: Internal Medicine

## 2009-05-25 LAB — HM DIABETES EYE EXAM: HM Diabetic Eye Exam: NEGATIVE

## 2009-05-29 ENCOUNTER — Ambulatory Visit: Payer: Self-pay | Admitting: Internal Medicine

## 2009-06-04 ENCOUNTER — Ambulatory Visit: Payer: Self-pay | Admitting: Oncology

## 2009-07-03 ENCOUNTER — Ambulatory Visit: Payer: Self-pay | Admitting: Internal Medicine

## 2009-08-21 ENCOUNTER — Telehealth: Payer: Self-pay | Admitting: Internal Medicine

## 2009-09-07 ENCOUNTER — Telehealth: Payer: Self-pay | Admitting: Internal Medicine

## 2009-09-14 ENCOUNTER — Encounter: Payer: Self-pay | Admitting: Internal Medicine

## 2009-09-29 ENCOUNTER — Telehealth (INDEPENDENT_AMBULATORY_CARE_PROVIDER_SITE_OTHER): Payer: Self-pay | Admitting: *Deleted

## 2009-10-01 ENCOUNTER — Ambulatory Visit: Payer: Self-pay | Admitting: Oncology

## 2009-10-01 ENCOUNTER — Telehealth (INDEPENDENT_AMBULATORY_CARE_PROVIDER_SITE_OTHER): Payer: Self-pay | Admitting: *Deleted

## 2009-10-05 ENCOUNTER — Telehealth: Payer: Self-pay | Admitting: Internal Medicine

## 2009-10-14 ENCOUNTER — Telehealth: Payer: Self-pay | Admitting: Internal Medicine

## 2009-11-24 ENCOUNTER — Encounter: Payer: Self-pay | Admitting: Internal Medicine

## 2009-11-24 ENCOUNTER — Ambulatory Visit: Payer: Self-pay | Admitting: Internal Medicine

## 2009-11-24 LAB — CONVERTED CEMR LAB
Blood Glucose, Fingerstick: 136
CO2: 28 meq/L (ref 19–32)
Chloride: 105 meq/L (ref 96–112)
Creatinine, Ser: 0.86 mg/dL (ref 0.40–1.20)
HDL: 55 mg/dL (ref >39)
LDL Cholesterol: 56 mg/dL (ref 0–99)

## 2009-12-03 ENCOUNTER — Telehealth: Payer: Self-pay | Admitting: Internal Medicine

## 2010-01-21 ENCOUNTER — Encounter: Payer: Self-pay | Admitting: Internal Medicine

## 2010-02-19 ENCOUNTER — Ambulatory Visit: Payer: Self-pay | Admitting: Infectious Disease

## 2010-02-19 DIAGNOSIS — R61 Generalized hyperhidrosis: Secondary | ICD-10-CM | POA: Insufficient documentation

## 2010-02-19 LAB — CONVERTED CEMR LAB: TSH: 0.501 microintl units/mL (ref 0.350–4.5)

## 2010-03-04 ENCOUNTER — Telehealth: Payer: Self-pay | Admitting: Internal Medicine

## 2010-04-01 ENCOUNTER — Telehealth: Payer: Self-pay | Admitting: Internal Medicine

## 2010-06-01 ENCOUNTER — Telehealth: Payer: Self-pay | Admitting: Internal Medicine

## 2010-06-08 ENCOUNTER — Telehealth: Payer: Self-pay | Admitting: Internal Medicine

## 2010-07-02 ENCOUNTER — Ambulatory Visit: Payer: Self-pay | Admitting: Internal Medicine

## 2010-07-05 ENCOUNTER — Telehealth: Payer: Self-pay | Admitting: Internal Medicine

## 2010-07-08 ENCOUNTER — Telehealth: Payer: Self-pay | Admitting: Internal Medicine

## 2010-08-02 ENCOUNTER — Telehealth: Payer: Self-pay | Admitting: Internal Medicine

## 2010-08-06 ENCOUNTER — Telehealth: Payer: Self-pay | Admitting: Internal Medicine

## 2010-09-09 ENCOUNTER — Encounter: Payer: Self-pay | Admitting: Internal Medicine

## 2010-10-04 ENCOUNTER — Telehealth: Payer: Self-pay | Admitting: Internal Medicine

## 2010-10-24 ENCOUNTER — Encounter: Payer: Self-pay | Admitting: Internal Medicine

## 2010-10-24 ENCOUNTER — Encounter: Payer: Self-pay | Admitting: Specialist

## 2010-10-25 ENCOUNTER — Encounter: Payer: Self-pay | Admitting: Internal Medicine

## 2010-11-02 NOTE — Progress Notes (Signed)
Summary: refill/gg  Phone Note Refill Request  on June 01, 2010 4:10 PM  Refills Requested: Medication #1:  METOPROLOL TARTRATE 100 MG  TABS Take 1 tablet by mouth two times a day   Last Refilled: 04/29/2010  Method Requested: Electronic Initial call taken by: Merrie Roof RN,  June 01, 2010 4:11 PM  Follow-up for Phone Call       Follow-up by: Clerance Lav MD,  June 02, 2010 10:30 PM    Prescriptions: METOPROLOL TARTRATE 100 MG  TABS (METOPROLOL TARTRATE) Take 1 tablet by mouth two times a day  #60 Tablet x 5   Entered and Authorized by:   Clerance Lav MD   Signed by:   Clerance Lav MD on 06/02/2010   Method used:   [facility]ronically to        [facility]  [address] [phone]* (retail)       [address]       [place], [place]  [id number]       Ph: [phone] or [phone]       Fax: [phone]   RxID:   [id number]

## 2010-11-02 NOTE — Letter (Signed)
Summary: Generic Letter  [facility]  [address]   [place], [place] [address]   Phone: [phone]  Fax: [phone]    11/24/2009  [facility] [address] [place], [place]  [address]  Dear Ms. Furness, This letter is to certify your inability to serve on jury duty due to physical disabilities. You can attach this letter to your excuse request.  I would like to request [employer], [place] of [address], [place], [place] to excuse Ms. Collet from jury duty on permanant basis.  Ms. Sunderlin is known to me for one year. I am her primary care provider. 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 Due to these serious medical conditions that require close monitoring of her blood sugars, blood pressure and diet, in my opinion she can not effectively serve as juror.  If you have questions or concerns, dont hesitate to write [place] back. Thank you in advance.   Sincerely,   Clerance Lav MD PhD

## 2010-11-02 NOTE — Progress Notes (Signed)
Summary: refill/ hla  Phone Note Refill Request Message from:  Fax from Pharmacy on December 03, 2009 10:35 AM  Refills Requested: Medication #1:  METOPROLOL TARTRATE 100 MG  TABS Take 1 tablet by mouth two times a day   Last Refilled: 2/1 Initial call taken by: Marin Roberts RN,  December 03, 2009 10:35 AM  Follow-up for Phone Call       Follow-up by: Clerance Lav MD,  December 03, 2009 12:20 PM    Prescriptions: METOPROLOL TARTRATE 100 MG  TABS (METOPROLOL TARTRATE) Take 1 tablet by mouth two times a day  #60 Tablet x 5   Entered and Authorized by:   Clerance Lav MD   Signed by:   Clerance Lav MD on 12/03/2009   Method used:   [facility]        [facility]  [address] [phone][facility] (retail)       [address]       [place], [place]  [address]       Ph: [phone] or [phone]       Fax: [phone]   RxID:   [id number]

## 2010-11-02 NOTE — Progress Notes (Signed)
Summary: Refill/gh  Phone Note Refill Request Message from:  Fax from Pharmacy on October 05, 2009 4:41 PM  Refills Requested: Medication #1:  ADULT ASPIRIN LOW STRENGTH 81 MG  TBDP Take 1 tablet by mouth once a day   Last Refilled: 09/02/2009  Method Requested: Electronic Initial call taken by: Angelina Ok RN,  October 05, 2009 4:42 PM    Prescriptions: ADULT ASPIRIN LOW STRENGTH 81 MG  TBDP (ASPIRIN) Take 1 tablet by mouth once a day  #31 x 3   Entered and Authorized by:   Clerance Lav MD   Signed by:   Clerance Lav MD on 10/07/2009   Method used:   [facility]        [facility]  [address] [phone][facility] (retail)       [address]       [place], [place]  [address]       Ph: [phone] or [phone]       Fax: [phone]   RxID:   [id number]

## 2010-11-02 NOTE — Progress Notes (Signed)
Summary: Refill/gh  Phone Note Refill Request Message from:  Fax from Pharmacy on August 06, 2010 2:15 PM  Refills Requested: Medication #1:  LISINOPRIL 2.5 MG TABS Take 1 tablet by mouth once a day   Last Refilled: 06/07/2010  Medication #2:  TRAMADOL HCL 50 MG TABS Take 1-2 tablets by mouth up to two times a day as needed for pain.   Last Refilled: 06/07/2010  Method Requested: Electronic Initial call taken by: Angelina Ok RN,  August 06, 2010 2:15 PM  Follow-up for Phone Call       Follow-up by: Clerance Lav MD,  August 09, 2010 8:01 AM    Prescriptions: TRAMADOL HCL 50 MG TABS (TRAMADOL HCL) Take 1-2 tablets by mouth up to two times a day as needed for pain  #60 x 3   Entered and Authorized by:   Clerance Lav MD   Signed by:   Clerance Lav MD on 08/09/2010   Method used:   [facility]        [facility]  [address] [phone]* (retail)       [address]       [place], [place]  [id number]       Ph: [phone] or [phone]       Fax: [phone]   RxID:   [id number] LISINOPRIL 2.5 MG TABS (LISINOPRIL) Take 1 tablet by mouth once a day  #90 x 2   Entered and Authorized by:   Clerance Lav MD   Signed by:   Clerance Lav MD on 08/09/2010   Method used:   [facility]        [facility] [phone]* (retail)       [address]       [place], [place]  [address]       Ph: [phone] or [phone]       Fax: [phone]   RxID:   [id number]

## 2010-11-02 NOTE — Assessment & Plan Note (Signed)
Summary: est-ck/fu/meds/cfb   Vital Signs:  Patient profile:   70 year old female Height:      69 inches (175.26 cm) Weight:      181.4 pounds (82.45 kg) BMI:     26.88 Temp:     97.4 degrees F (36.33 degrees C) Pulse rate:   92 / minute BP sitting:   111 / 69  (right arm) Cuff size:   large  Vitals Entered By: Theotis Barrio NT II (November 24, 2009 3:12 PM) CC: MEDICATION REFILL  / BACK PAIN  /  NEED LETTER FOR [place]Y D[employer]- [place]  / CHECK RX FOR STRIPS-CHECK CBG TWIICE A DAY Is Patient Diabetic? Yes Did you bring your meter with you today? Yes Pain Assessment Patient in pain? yes     Location: BACK PAIN Intensity:    7 Type: dull Onset of pain  Chronic Nutritional Status BMI of 25 - 29 = overweight CBG Result 136  Have you ever been in a relationship where you felt threatened, hurt or afraid?No   Does patient need assistance? Functional Status Self care Ambulation Normal Comments MEDICATION REFILL  /  BACK PAIN  / NEED LETTER FOR [place] /  CHECK RX FOR STRIPS / CHECKS CBG TWICE A DAY.   Primary Care Provider:  Clerance Lav MD  CC:  MEDICATION REFILL  / BACK PAIN  /  NEED LETTER FOR [place] D[place]- [place]  / CHECK RX FOR STRIPS-CHECK CBG TWIICE A DAY.  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 She needs some refills and excuse letter for jury duty. She has no other complains.   Depression History:      The patient denies a depressed mood most of the day and a diminished interest in her  usual daily activities.         Preventive Screening-Counseling & Management  Alcohol-Tobacco     Alcohol drinks/day: 0     Smoking Status: quit     Year Quit: many years ago  Caffeine-Diet-Exercise     Does Patient Exercise: yes     Type of exercise: WALKING     Times/week: AT TIMES  Current Medications (verified): 1)  Os-Cal Ultra 600 Mg Tabs (Calcium Carb-Vit D-C-E-Mineral) .... Take 1 Tablet By Mouth Two Times A Day 2)  Prednisone 10 Mg  Tabs (Prednisone) .... Take 1 Tablet By Mouth Once A Day 3)  Fish Oil 1000 Mg Caps (Omega-3 Fatty Acids) .... Take 1 Tablet By Mouth Two Times A Day 4)  Fosamax 70 Mg Tabs (Alendronate Sodium) .... Take 1 Tablet By Mouth Once A Week 5)  Nitroquick 0.4 Mg  Subl (Nitroglycerin) .... Place One Under Tongue Up To Every 5 Minutes X 3 Doses For Chest Pain and Call Your Docotor If Pain Not Gone After The 3rd Dose 6)  Metoprolol Tartrate 100 Mg  Tabs (Metoprolol Tartrate) .... Take 1 Tablet By Mouth Two Times A Day 7)  Folic Acid 1 Mg  Tabs (Folic Acid) .... Take 1 Tablet By Mouth Once A Day 8)  Pravachol 40 Mg Tabs (Pravastatin Sodium) .... Take  1 Tablet By Mouth At Bedtime For Cholesterol 9)  Adult Aspirin Low Strength 81 Mg  Tbdp (Aspirin) .... Take 1 Tablet By Mouth Once A Day 10)  Bd Insulin Syringe Ultrafine 31g X 5/16" 0.5 Ml  Misc (Insulin Syringe-Needle U-100) .... Use As Directed To Inject Insulin 11)  Onetouch Ultra Test   Strp (Glucose Blood) .... Use To Test Blood Glucose Once Daily 12)  Lancets   Misc (Lancets) .... Use To Test Blood Glucsoe 4x Daily 13)  Omeprazole 40 Mg Cpdr (Omeprazole) .... Take 1 Tablet By Mouth Once A Day For Acid Reflux 14)  Furosemide 40 Mg Tabs (Furosemide) .... Take 1/2  Tablet By Mouth Once A Day 15)  Fexofenadine Hcl 180 Mg Tabs (Fexofenadine Hcl) .... Take 1 Tablet By Mouth Once A Day For Allergies 16)  Lisinopril 2.5 Mg Tabs (Lisinopril) .... Take 1 Tablet By Mouth Once A Day 17)  Metformin Hcl 1000 Mg Tabs  (Metformin Hcl) .... Take 1 Tablet By Mouth Two Times A Day For Your Diabetes 18)  Tramadol Hcl 50 Mg Tabs (Tramadol Hcl) .... 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    Risk Factors: Alcohol Use: 0 (11/24/2009) Exercise: yes (11/24/2009)  Risk Factors: Smoking Status: quit (11/24/2009)  Review of Systems      See HPI  Physical Exam  General:  Well-developed,well-nourished,in no acute distress; alert,appropriate and cooperative throughout examination Head:  normocephalic and atraumatic.   Eyes:  EOMI, anicteric Nose:  no external deformity.   Mouth:  OP clear, mmm Neck:  supple, full ROM, and no masses.   Lungs:  normal respiratory effort, no intercostal retractions, no accessory muscle use, no crackles, and no wheezes.   Heart:  normal rate, no murmur, and irregular rhythm.   Abdomen:  soft, non-tender, and normal bowel sounds.   Msk:  no joint swelling, no joint warmth, and no  redness over joints.   Neurologic:  alert & oriented X3.   Psych:  Appropriate   Impression & Recommendations:  Problem # 1:  DM (ICD-250.00)  well controlled. No changes made on this visit. CBG strips prescription doubled to accomodate frequent testing.  Her updated medication list for this problem includes:    Adult Aspirin Low Strength 81 Mg Tbdp (Aspirin) .Marland Kitchen... Take 1 tablet by mouth once a day    Lisinopril 2.5 Mg Tabs (Lisinopril) .Marland Kitchen... Take 1 tablet by mouth once a day    Metformin Hcl 1000 Mg Tabs (Metformin hcl) .Marland Kitchen... Take 1 tablet by mouth two times a day for your diabetes  Orders:  T- Capillary Blood Glucose (82948) T-Hgb A1C (in-house) (84696EX)  Labs Reviewed: Creat: 0.76 (12/25/2008)     Last Eye Exam: No diabetic retinopathy.    (05/05/2009) Reviewed HgBA1c results: 6.6 (11/24/2009)  6.6 (05/29/2009)  Problem # 2:  HYPERTENSION (ICD-401.9) BP well controlled. Will check BMP to monitor K and Serum creatinine.  Her updated medication list for this problem includes:    Metoprolol Tartrate 100 Mg Tabs (Metoprolol tartrate) .Marland Kitchen... Take 1 tablet by mouth two times a day    Furosemide 40 Mg Tabs (Furosemide) .Marland Kitchen... Take 1/2  tablet by mouth once a day    Lisinopril 2.5 Mg Tabs (Lisinopril) .Marland Kitchen... Take 1 tablet by mouth once a day  Orders: T-Basic Metabolic Panel [phone])  BP today: 111/69 Prior BP: 123/72 (05/29/2009)  Labs Reviewed: K+: 5.2 (12/25/2008) Creat: : 0.76 (12/25/2008)   Chol: 173 (08/26/2008)   HDL: 63 (08/26/2008)   LDL: 93 (08/26/2008)   TG: 83 (08/26/2008)  Problem # 3:  HYPERLIPIDEMIA (ICD-272.4) will repeat lipid panel to look for gross abnormality. Fasting lipid panel is too difficult to get in her given she and her husband both come on same visits and they are difficult to arrange. Review shows that they are satisfactory. I will continue pravachol at 40 mg.  Her updated medication list for this problem includes:    Pravachol 40 Mg Tabs  (Pravastatin sodium) .Marland Kitchen... Take 1 tablet by mouth at bedtime for cholesterol  Orders: T-Lipid Profile ([id number])  Labs Reviewed: SGOT: 18 (12/25/2008)   SGPT: 19 (12/25/2008)   HDL:63 (08/26/2008), 60 (01/15/2008)  LDL:93 (08/26/2008), 102 (40/34/7425)  Chol:173 (08/26/2008), 215 (01/15/2008)  Trig:83 (08/26/2008), 264 (01/15/2008)  Problem # 4:  ANEMIA, HEMOLYTIC, AUTOIMMUNE (ICD-283.0) Hemoglobin satisfactory last checked. No evidence of jaundice or weakness on exam.  Her updated medication list for this problem includes:    Folic Acid 1 Mg Tabs (Folic acid) .Marland Kitchen... Take 1 tablet by mouth once a day  Complete Medication List: 1)  Os-cal Ultra 600 Mg Tabs (Calcium carb-vit d-c-e-mineral) .... Take 1 tablet by mouth two times a day 2)  Prednisone 10 Mg Tabs (Prednisone) .... Take 1 tablet by mouth once a day 3)  Fish Oil 1000 Mg Caps (Omega-3 fatty acids) .... Take 1 tablet by mouth two times a day 4)  Fosamax 70 Mg Tabs (Alendronate sodium) .... Take 1 tablet by mouth once a week 5)  Nitroquick 0.4 Mg Subl (Nitroglycerin) .... Place one under tongue up to every 5 minutes x 3 doses for chest pain and call your docotor if pain not gone after the 3rd dose 6)  Metoprolol Tartrate 100 Mg Tabs (Metoprolol tartrate) .... Take 1 tablet by mouth two times a day 7)  Folic Acid 1 Mg Tabs (Folic acid) .... Take 1 tablet by mouth once a day 8)  Pravachol 40 Mg Tabs (Pravastatin sodium) .... Take 1 tablet by mouth at bedtime for cholesterol 9)  Adult Aspirin Low Strength 81 Mg Tbdp (Aspirin) .... Take 1 tablet by mouth once a day 10)  Bd Insulin Syringe Ultrafine 31g X 5/16" 0.5 Ml Misc (Insulin syringe-needle u-100) .... Use as directed to inject insulin 11)  Onetouch Ultra Test Strp (Glucose blood) .... Use to test blood glucose once daily 12)  Lancets Misc (Lancets) .... Use to test blood glucsoe 4x daily 13)  Omeprazole 40 Mg Cpdr (Omeprazole) .... Take 1 tablet by mouth once a day for acid  reflux 14)  Furosemide 40 Mg Tabs (Furosemide) .... Take  1/2  tablet by mouth once a day 15)  Fexofenadine Hcl 180 Mg Tabs (Fexofenadine hcl) .... Take 1 tablet by mouth once a day for allergies 16)  Lisinopril 2.5 Mg Tabs (Lisinopril) .... Take 1 tablet by mouth once a day 17)  Metformin Hcl 1000 Mg Tabs (Metformin hcl) .... Take 1 tablet by mouth two times a day for your diabetes 18)  Tramadol Hcl 50 Mg Tabs (Tramadol hcl) .... Take 1-2 tablets by mouth up to two times a day as needed for pain  Other Orders: T-Hemoccult Card-Multiple (take home) ([id number])  Patient Instructions: 1)  Please schedule a follow-up appointment in 3 months. Prescriptions: TRAMADOL HCL 50 MG TABS (TRAMADOL HCL) Take 1-2 tablets by mouth up to two times a day as needed for pain  #60 x 3   Entered and Authorized by:   Clerance Lav MD   Signed by:   Clerance Lav MD on 11/24/2009   Method used:   Electronically to        [facility]  [address] [phone]* (retail)       [address]       [place], [place]  [address]       Ph: [phone] or [phone]       Fax: [phone]   RxID:   [id number] METFORMIN HCL 1000 MG TABS (METFORMIN HCL) Take 1 tablet by mouth two times a day for your diabetes  #60 x 2   Entered and Authorized by:   Clerance Lav MD   Signed by:   Clerance Lav MD on 11/24/2009   Method used:   [facility] to        [facility]  [address] [phone]* (retail)       [address]       [place], [place]  [address]       Ph: [phone] or [phone]       Fax: [phone]   RxID:   [id number] LISINOPRIL 2.5 MG TABS (LISINOPRIL) Take 1 tablet by mouth once a day  #30 Tablet x 2   Entered and Authorized by:   Clerance Lav MD   Signed by:   Clerance Lav MD on 11/24/2009   Method used:   [facility]        [facility] [phone]* (retail)       [address]       [place], [place]   [address]       Ph: [phone] or [phone]       Fax: [phone]   RxID:   [id number] OMEPRAZOLE 40 MG CPDR (OMEPRAZOLE) Take 1 tablet by mouth once a day for acid reflux  #30 x 3   Entered and Authorized by:   Clerance Lav MD   Signed by:   Clerance Lav MD on 11/24/2009   Method used:   [facility]        [facility]  [address] [phone]* (retail)       [address]       [place], [place]  [address]       Ph: [phone] or [phone]       Fax: [phone]   RxID:   [id number] ONETOUCH ULTRA TEST   STRP (GLUCOSE BLOOD) use to test blood glucose once daily  Brand medically necessary #100 x 11   Entered and Authorized by:   Clerance Lav MD   Signed by:   Clerance Lav MD on 11/24/2009   Method used:   [facility]        [facility] [phone]* (retail)       [address]       [place], [place]  [address]       Ph: [phone] or [phone]       Fax: [phone]   RxID:   [id number]  Process Orders Check Orders Results:     [facility]: Check successful Tests Sent for requisitioning (November 25, 2009 11:17 AM):     11/24/2009: [facility] -- T-Lipid Profile [phone] (signed)     11/24/2009: [facility] -- T-Basic Metabolic Panel [phone] (signed)    Prevention & Chronic Care Immunizations   Influenza vaccine: Fluvax MCR  (07/03/2009)   Influenza vaccine due: 06/03/2010    Tetanus booster: 05/29/2009: Td   Td booster deferral: Deferred  (11/24/2009)    Pneumococcal vaccine: Pneumovax (Medicare)  (05/29/2009)   Pneumococcal vaccine due: 05/03/2014    H. zoster vaccine: Not documented  Colorectal Screening   Hemoccult: Not documented   Hemoccult action/deferral: Ordered  (11/24/2009)    Colonoscopy: Not documented  Other Screening   Pap smear:  Specimen Adequacy: Satisfactory for evaluation.    Interpretation/Result:Negative for intraepithelial Lesion or Malignancy.   Location: [facility].    (01/29/2009)   Pap smear due: 01/2010    Mammogram: Not documented   Mammogram action/deferral: Ordered  (05/29/2009)    DXA bone density scan: Not documented   DXA bone density action/deferral: Not indicated  (05/29/2009)  Reports requested:   Last colonoscopy report requested.  Smoking status: quit  (11/24/2009)  Diabetes Mellitus   HgbA1C: 6.6  (11/24/2009)    Eye exam: No diabetic retinopathy.     (05/05/2009)   Eye exam due: 05/2010    Foot exam: yes  (12/25/2008)   High risk foot: Not documented   Foot care education: Not documented    Urine microalbumin/creatinine ratio: 8.7  (12/25/2008)    Diabetes flowsheet reviewed?: Yes   Progress toward A1C goal: At goal  Lipids   Total Cholesterol: 173  (08/26/2008)   Lipid panel action/deferral: Lipid Panel ordered   LDL: 93  (08/26/2008)   LDL Direct: Not documented   HDL: 63  (08/26/2008)   Triglycerides: 83  (08/26/2008)    SGOT (AST): 18  (12/25/2008)   SGPT (ALT): 19  (12/25/2008)   Alkaline phosphatase: 58  (12/25/2008)   Total bilirubin: 0.7  (12/25/2008)    Lipid flowsheet reviewed?: Yes   Progress toward LDL goal: Unchanged  Hypertension   Last Blood Pressure: 111 / 69  (11/24/2009)   Serum creatinine: 0.76  (12/25/2008)   BMP action: Ordered   Serum potassium 5.2  (12/25/2008)    Hypertension flowsheet reviewed?: Yes   Progress toward BP goal: At goal  Self-Management Support :   Personal Goals (by the next clinic visit) :     Personal A1C goal: 7  (05/29/2009)     Personal blood pressure goal: 140/90  (05/29/2009)     Personal LDL goal: 100  (05/29/2009)    Patient will work on the following items until the next clinic visit to reach self-care goals:     Medications and monitoring: take my medicines every day,  bring all of my medications to every visit, examine my feet every day   (11/24/2009)     Eating: drink diet soda or water instead of juice or soda, eat more vegetables, use fresh or frozen vegetables, eat foods that are low in salt, eat baked foods instead of fried foods, eat fruit for snacks and desserts, limit or avoid alcohol  (11/24/2009)     Activity: take a 30 minute walk every day  (11/24/2009)    Diabetes self-management support: Resources for patients handout, Written self-care plan  (11/24/2009)   Diabetes care plan printed   Last diabetes self-management training by diabetes educator: 03/31/2008   Last medical nutrition therapy: 03/31/2008    Hypertension self-management support: Resources for patients handout, Written self-care plan  (11/24/2009)   Hypertension self-care plan printed.    Lipid self-management support: Resources for patients handout, Written self-care plan  (11/24/2009)   Lipid self-care plan printed.      Resource handout printed.   Nursing Instructions: Provide Hemoccult cards with instructions (see order) Request report of last colonoscopy    Laboratory Results   Blood Tests   Date/Time Received: November 24, 2009 3:39 PM  Date/Time Reported: Burke Keels  November 24, 2009 3:39 PM   HGBA1C: 6.6%   (Normal Range: Non-Diabetic - 3-6%   Control Diabetic - 6-8%) CBG Random:: 136mg /dL

## 2010-11-02 NOTE — Progress Notes (Signed)
Summary: Refill/gh  Phone Note Refill Request Message from:  Fax from Pharmacy on August 02, 2010 11:53 AM  Refills Requested: Medication #1:  OMEPRAZOLE 40 MG CPDR Take 1 tablet by mouth once a day for acid reflux   Last Refilled: 07/06/2010  Method Requested: Electronic Initial call taken by: Angelina Ok RN,  August 02, 2010 11:53 AM    Prescriptions: OMEPRAZOLE 40 MG CPDR (OMEPRAZOLE) Take 1 tablet by mouth once a day for acid reflux  #90 x 3   Entered and Authorized by:   Clerance Lav MD   Signed by:   Clerance Lav MD on 08/02/2010   Method used:   [facility]        [facility] [phone]* (retail)       [address]       [place], [place]  [address]       Ph: [phone] or [phone]       Fax: [phone]   RxID:   [id number]

## 2010-11-02 NOTE — Progress Notes (Signed)
Summary: refill/GG  Phone Note Refill Request  on March 04, 2010 11:43 AM  Refills Requested: Medication #1:  METFORMIN HCL 1000 MG TABS Take 1 tablet by mouth two times a day for your diabetes   Last Refilled: 02/03/2010  Medication #2:  LISINOPRIL 2.5 MG TABS Take 1 tablet by mouth once a day   Last Refilled: 02/03/2010  Method Requested: Electronic Initial call taken by: Merrie Roof RN,  March 04, 2010 11:42 AM  Follow-up for Phone Call       Follow-up by: Clerance Lav MD,  March 05, 2010 9:32 PM    Prescriptions: METFORMIN HCL 1000 MG TABS (METFORMIN HCL) Take 1 tablet by mouth two times a day for your diabetes  #60 x 2   Entered and Authorized by:   Clerance Lav MD   Signed by:   Clerance Lav MD on 03/05/2010   Method used:   [facility]        [facility]  [address] [phone]* (retail)       [address]       [place], [place]  [address]       Ph: [phone] or [phone]       Fax: [phone]   RxID:   [id number] LISINOPRIL 2.5 MG TABS (LISINOPRIL) Take 1 tablet by mouth once a day  #30 Tablet x 2   Entered and Authorized by:   Clerance Lav MD   Signed by:   Clerance Lav MD on 03/05/2010   Method used:   [facility]        [facility] [phone]* (retail)       [address]       [place], [place]  [address]       Ph: [phone] or [phone]       Fax: [phone]   RxID:   [id number]

## 2010-11-02 NOTE — Progress Notes (Signed)
Summary: med refill/gp  Phone Note Refill Request Message from:  Fax from Pharmacy on July 08, 2010 10:07 AM  Refills Requested: Medication #1:  PRAVACHOL 40 MG TABS Take 1 tablet by mouth at bedtime for cholesterol   Last Refilled: 05/08/2010 Last appt. 02/19/10.   Method Requested: Electronic Initial call taken by: Chinita Pester RN,  July 08, 2010 10:07 AM

## 2010-11-02 NOTE — Progress Notes (Signed)
Summary: refill/gg  Phone Note Refill Request  on April 01, 2010 10:59 AM  Refills Requested: Medication #1:  OMEPRAZOLE 40 MG CPDR Take 1 tablet by mouth once a day for acid reflux   Last Refilled: 03/02/2010  Method Requested: Electronic Initial call taken by: Merrie Roof RN,  April 01, 2010 11:00 AM    Prescriptions: OMEPRAZOLE 40 MG CPDR (OMEPRAZOLE) Take 1 tablet by mouth once a day for acid reflux  #30 x 3   Entered and Authorized by:   Clerance Lav MD   Signed by:   Clerance Lav MD on 04/01/2010   Method used:   [facility]        [facility]  [address] [phone]* (retail)       [address]       [place], [place]  [id number]       Ph: [phone] or [phone]       Fax: [phone]   RxID:   [id number]

## 2010-11-02 NOTE — Assessment & Plan Note (Signed)
Summary: checkup   Vital Signs:  Patient profile:   70 year old female Height:      69 inches Weight:      178.9 pounds BMI:     26.51 Temp:     98.8 degrees F oral Pulse rate:   95 / minute BP sitting:   131 / 79  (right arm)  Vitals Entered By: Filomena Jungling NT II (Feb 19, 2010 2:50 PM) CC: sweating Is Patient Diabetic? Yes Did you bring your meter with you today? No  Have you ever been in a relationship where you felt threatened, hurt or afraid?No   Does patient need assistance? Functional Status Self care Ambulation Normal   Primary Care Provider:  Clerance Lav MD  CC:  sweating.  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 She reports that she has been having "sweating" over her entire body for the last few months. She reports that this happens every day. She denies any aggravating factors and reports that when she washes her face with wet cloth, she feels better for sometime and starts back again. She denies any CP,SOB, Orthopnea or any associated complaints. She never had this problems before. She rports that she has had hot flashed before and these episodes are different from hot flashes. She denies any hypo/hyperthyroid type symptoms.  She denies any other complaints.  Preventive Screening-Counseling & Management  Alcohol-Tobacco     Alcohol drinks/day: 0     Smoking Status: quit     Year Quit: many years ago  Caffeine-Diet-Exercise     Does Patient Exercise: yes     Type of exercise: WALKING     Times/week: AT TIMES  Problems Prior to Update: 1)  Dm   (ICD-250.00) 2)  Hypertension  (ICD-401.9) 3)  Hyperlipidemia  (ICD-272.4) 4)  Anemia, Hemolytic, Autoimmune  (ICD-283.0) 5)  Lupus  (ICD-710.0) 6)  Atrial Fibrillation  (ICD-427.31) 7)  Mitral Regurgitation, Moderate  (ICD-424.0) 8)  Low Back Pain  (ICD-724.2) 9)  Dependent Edema, Legs  (ICD-782.3) 10)  Lesion, Cervix  (ICD-236.3)  Medications Prior to Update: 1)  Os-Cal Ultra 600 Mg Tabs (Calcium Carb-Vit D-C-E-Mineral) .... Take 1 Tablet By Mouth Two Times A Day 2)  Prednisone 10 Mg  Tabs (Prednisone) .... Take 1 Tablet By Mouth Once A Day 3)  Fish Oil 1000 Mg Caps (Omega-3 Fatty Acids) .... Take 1 Tablet By Mouth Two Times A Day 4)  Fosamax 70 Mg Tabs (Alendronate Sodium) .... Take 1 Tablet By Mouth Once A Week 5)  Nitroquick 0.4 Mg  Subl (Nitroglycerin) .... Place One Under Tongue Up To Every 5 Minutes X 3 Doses For Chest Pain and Call Your Docotor If Pain Not Gone After The 3rd Dose 6)  Metoprolol Tartrate 100 Mg  Tabs (Metoprolol Tartrate) .... Take 1 Tablet By Mouth Two Times A Day 7)  Folic Acid 1 Mg  Tabs (Folic Acid) .... Take 1 Tablet By Mouth Once A Day 8)  Pravachol 40 Mg Tabs (Pravastatin Sodium) .... Take 1 Tablet By Mouth At Bedtime For Cholesterol 9)  Adult Aspirin Low Strength 81 Mg  Tbdp (Aspirin) .... Take 1 Tablet By  Mouth Once A Day 10)  Bd Insulin Syringe Ultrafine 31g X 5/16" 0.5 Ml  Misc (Insulin Syringe-Needle U-100) .... Use As Directed To Inject Insulin 11)  Onetouch Ultra Test   Strp (Glucose Blood) .... Use To Test Blood Glucose Once Daily 12)  Lancets   Misc (Lancets) .... Use To Test Blood Glucsoe 4x Daily 13)  Omeprazole 40 Mg Cpdr (Omeprazole) .... Take 1 Tablet By Mouth Once A Day For Acid Reflux 14)  Furosemide 40 Mg Tabs (Furosemide) .... Take 1/2  Tablet By Mouth Once A Day 15)  Fexofenadine Hcl 180 Mg Tabs (Fexofenadine Hcl) .... Take 1 Tablet By Mouth Once A Day For Allergies 16)  Lisinopril 2.5 Mg Tabs (Lisinopril) .... Take 1 Tablet By Mouth Once  A Day 17)  Metformin Hcl 1000 Mg Tabs (Metformin Hcl) .... Take 1 Tablet By Mouth Two Times A Day For Your Diabetes 18)  Tramadol Hcl 50 Mg Tabs (Tramadol Hcl) .... Take 1-2 Tablets By Mouth Up To Two Times A Day As Needed For Pain  Current Medications (verified): 1)  Os-Cal Ultra 600 Mg Tabs (Calcium Carb-Vit D-C-E-Mineral) .... Take 1 Tablet By Mouth Two Times A Day 2)  Prednisone 10 Mg  Tabs (Prednisone) .... Take 1 Tablet By Mouth Once A Day 3)  Fish Oil 1000 Mg Caps (Omega-3 Fatty Acids) .... Take 1 Tablet By Mouth Two Times A Day 4)  Fosamax 70 Mg Tabs (Alendronate Sodium) .... Take 1 Tablet By Mouth Once A Week 5)  Nitroquick 0.4 Mg  Subl (Nitroglycerin) .... Place One Under Tongue Up To Every 5 Minutes X 3 Doses For Chest Pain and Call Your Docotor If Pain Not Gone After The 3rd Dose 6)  Metoprolol Tartrate 100 Mg  Tabs (Metoprolol Tartrate) .... Take 1 Tablet By Mouth Two Times A Day 7)  Folic Acid 1 Mg  Tabs (Folic Acid) .... Take 1 Tablet By Mouth Once A Day 8)  Pravachol 40 Mg Tabs (Pravastatin Sodium) .... Take 1 Tablet By Mouth At Bedtime For Cholesterol 9)  Adult Aspirin Low Strength 81 Mg  Tbdp (Aspirin) .... Take 1 Tablet By Mouth Once A Day 10)  Bd Insulin Syringe Ultrafine 31g X 5/16" 0.5 Ml  Misc (Insulin Syringe-Needle U-100) .... Use As Directed To Inject Insulin 11)  Onetouch Ultra Test   Strp (Glucose Blood) .... Use To Test Blood Glucose Once Daily 12)  Lancets   Misc (Lancets) .... Use To Test Blood Glucsoe 4x Daily 13)  Omeprazole 40 Mg Cpdr (Omeprazole) .... Take 1 Tablet By Mouth Once A Day For Acid Reflux 14)  Furosemide 40 Mg Tabs (Furosemide) .... Take 1/2  Tablet By Mouth Once A Day 15)  Lisinopril 2.5 Mg Tabs (Lisinopril) .... Take 1 Tablet By Mouth Once A Day 16)  Metformin Hcl 1000 Mg Tabs (Metformin Hcl) .... Take 1 Tablet By Mouth Two Times A Day For Your Diabetes 17)  Tramadol Hcl 50 Mg Tabs (Tramadol Hcl) .... Take 1-2 Tablets By Mouth Up To Two Times A  Day As Needed For Pain  Allergies (verified): No Known Drug Allergies  Past History:  Family History: Last updated: 27-Dec-2008 M died at 3 heart disease F died in his 71s unknown 3 Sibs 1 bro dead unknown cause, 1 sis dead unknown cause - ? 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    Risk Factors: Alcohol Use: 0 (02/19/2010) Exercise: yes (02/19/2010)  Risk Factors: Smoking Status: quit (02/19/2010)  Past Medical History: Reviewed history from 05/15/2008 and no changes required. 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 Cataract extraction x 2  Family History: Reviewed history from 12/25/2008 and no changes required. M died at 104 heart disease F died in his 85s unknown 3 Sibs 1 bro dead unknown cause, 1 sis dead unknown cause - ? too much NTG, 1 bro 71 unknown medical problems 5 Children, 1 son with unkown medical problems  Social History: Reviewed history from 01/21/2008 and no changes required. 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    Review of Systems      See HPI  Physical Exam  General:  alert, well-developed, and well-nourished.   Lungs:  normal respiratory effort, no intercostal retractions, no accessory muscle use, and normal breath sounds.   Heart:  normal rate and regular rhythm.     Abdomen:  soft and normal bowel sounds.   Msk:  normal ROM.   Pulses:  R radial normal.   Extremities:  trace left pedal edema and trace right pedal edema.   Neurologic:  alert & oriented X3, strength normal in all extremities, and gait normal.     Impression & Recommendations:  Problem # 1:  DM (ICD-250.00)  Well controlled. Continue current regimen.  Her updated medication list for this problem includes:    Adult Aspirin Low Strength 81 Mg Tbdp (Aspirin) .Marland Kitchen... Take 1 tablet by mouth once a day    Lisinopril 2.5 Mg Tabs (Lisinopril) .Marland Kitchen... Take 1 tablet by mouth once a day    Metformin Hcl 1000 Mg Tabs (Metformin hcl) .Marland Kitchen... Take 1 tablet by mouth two times a day for your diabetes  Labs Reviewed: Creat: 0.86 (11/24/2009)     Last Eye Exam: No diabetic retinopathy.    (05/05/2009) Reviewed HgBA1c results: 6.6 (11/24/2009)  6.6 (05/29/2009)  Orders: T-Hgb A1C (in-house) ([id number]) T-TSH ([id number]) T-T4, Free ([id number])  Problem # 2:  HYPERTENSION (ICD-401.9) Well controlled. Continue current regimen.  Her updated medication list for this problem includes:    Metoprolol Tartrate 100 Mg Tabs (Metoprolol tartrate) .Marland Kitchen... Take 1 tablet by mouth two times a day    Furosemide 40 Mg Tabs (Furosemide) .Marland Kitchen... Take 1/2  tablet by mouth once a day    Lisinopril 2.5 Mg Tabs (Lisinopril) .Marland Kitchen... Take 1 tablet by mouth once a day  BP today: 131/79 Prior BP: 111/69 (11/24/2009)  Labs Reviewed: K+: 4.5 (11/24/2009) Creat: : 0.86 (11/24/2009)   Chol: 145 (11/24/2009)   HDL: 55 (11/24/2009)   LDL: 56 (11/24/2009)   TG: 168 (11/24/2009)  Problem # 3:  SWEATING (ICD-780.8)  Bizarre presentation without any other associated symptoms. Possible etiologies include - Hot weather vs Hot flashes vs Thyroid abnormalities vs ?Lupus related. Will check TSH and will follow up. If TSH is normal and symptoms persist and bother her, might consider a trial a Clonidine.   Orders: T-Hgb A1C (in-house)  [phone]) T-TSH [phone]) T-T4, Free [id number])  Problem # 4:  LUPUS (ICD-710.0) Patient reports that she doesn't have any problems with her lupus. Continue prednisone.  Her updated medication list for this problem includes:    Prednisone 10 Mg Tabs (Prednisone) .Marland Kitchen... Take 1 tablet by mouth once a day    Adult Aspirin Low Strength 81 Mg Tbdp (Aspirin) .Marland Kitchen... Take 1 tablet by mouth once a day  Problem # 5:  ATRIAL FIBRILLATION (ICD-427.31) Rate controlled. Continue current regimen.  Her updated medication list for this problem includes:    Metoprolol Tartrate 100 Mg Tabs (Metoprolol tartrate) .Marland Kitchen... Take 1 tablet by mouth two times a day    Adult Aspirin Low Strength 81 Mg Tbdp (Aspirin) .Marland Kitchen... Take 1 tablet by mouth once a day  Reviewed the following: PT: 15.1 (11/15/2007)   INR: 6.0 (01/21/2008) Coumadin Dose (weekly): 65.0mg /week (01/21/2008) Prior Coumadin Dose (weekly): 65.0mg /week (01/21/2008) Next Protime: 02/11/2008 (dated on 01/21/2008)  Complete Medication List: 1)  Os-cal Ultra 600 Mg Tabs (Calcium carb-vit d-c-e-mineral) .... Take 1 tablet by mouth two times a day 2)  Prednisone 10 Mg Tabs (Prednisone) .... Take 1 tablet by mouth once a day 3)  Fish Oil 1000 Mg Caps (Omega-3 fatty acids) .... Take 1 tablet by mouth two times a day 4)  Fosamax 70 Mg Tabs (Alendronate sodium) .... Take 1 tablet by mouth once a week 5)  Nitroquick 0.4 Mg Subl (Nitroglycerin) .... Place one under tongue up to every 5 minutes x 3 doses for chest pain and call your docotor if pain not gone after the 3rd dose 6)  Metoprolol Tartrate 100 Mg Tabs (Metoprolol tartrate) .... Take 1 tablet by mouth two times a day 7)  Folic Acid 1 Mg Tabs (Folic acid) .... Take 1 tablet by mouth once a day 8)  Pravachol 40 Mg Tabs (Pravastatin sodium) .... Take 1 tablet by mouth at bedtime for cholesterol 9)  Adult Aspirin Low Strength 81 Mg Tbdp (Aspirin) .... Take 1 tablet by mouth once a day 10)  Bd Insulin  Syringe Ultrafine 31g X 5/16" 0.5 Ml Misc (Insulin syringe-needle u-100) .... Use as directed to inject insulin 11)  Onetouch Ultra Test Strp (Glucose blood) .... Use to test blood glucose once daily 12)  Lancets Misc (Lancets) .... Use to test blood glucsoe 4x daily 13)  Omeprazole 40 Mg Cpdr (Omeprazole) .... Take 1 tablet by mouth once a day for acid reflux 14)  Furosemide 40 Mg Tabs (Furosemide) .... Take 1/2  tablet by mouth once a day 15)  Lisinopril 2.5 Mg Tabs (Lisinopril) .... Take 1 tablet by mouth once a day 16)  Metformin Hcl 1000 Mg Tabs (Metformin hcl) .... Take 1 tablet by mouth two times a day for your diabetes 17)  Tramadol Hcl 50 Mg Tabs (Tramadol hcl) .... Take 1-2 tablets by mouth up to two times a day as needed for pain  Patient Instructions: 1)  Please schedule a follow-up appointment in 6 months. 2)  Take all the medications as advised below. Process Orders Check Orders Results:     [facility]: Check successful Tests Sent for requisitioning (Feb 19, 2010 3:16 PM):     02/19/2010: [facility] -- T-TSH [phone] (signed)     02/19/2010: [facility] -- T-T4, [place] [[id number]] (signed)    Appended Document: Results of HGB A1C    Lab Visit  Laboratory Results   Blood Tests   Date/Time Received: Feb 19, 2010 4:02 PM  Date/Time Reported: Alric Quan  Feb 19, 2010 4:02 PM  HGBA1C: 6.6%   (Normal Range: Non-Diabetic - 3-6%   Control Diabetic - 6-8%)    Orders Today:

## 2010-11-02 NOTE — Progress Notes (Signed)
Summary: REfill/gh  Phone Note Refill Request Message from:  Fax from Pharmacy on July 05, 2010 11:40 AM  Refills Requested: Medication #1:  PRAVACHOL 40 MG TABS Take 1 tablet by mouth at bedtime for cholesterol   Last Refilled: 05/08/2010  Method Requested: Electronic Initial call taken by: Angelina Ok RN,  July 05, 2010 11:41 AM    Prescriptions: PRAVACHOL 40 MG TABS (PRAVASTATIN SODIUM) Take 1 tablet by mouth at bedtime for cholesterol  #90 x 3   Entered and Authorized by:   Clerance Lav MD   Signed by:   Clerance Lav MD on 07/09/2010   Method used:   [facility] to        [facility]  [address] [phone]* (retail)       [address]       [place], [place]  [address]       Ph: [phone] or [phone]       Fax: [phone]   RxID:   [id number]

## 2010-11-02 NOTE — Assessment & Plan Note (Signed)
Summary: FLU/SB.  Nurse Visit   Allergies: No Known Drug Allergies  Immunizations Administered:  Influenza Vaccine # 1:    Vaccine Type: Fluvax MCR    Site: right deltoid    Mfr: GlaxoSmithKline    Dose: 0.5 ml    Route: IM    Given by: Chinita Pester RN    Exp. Date: 04/02/2011    Lot #: [id number]    VIS given: 04/27/10 version given July 02, 2010.  Flu Vaccine Consent Questions:    Do you have a history of severe allergic reactions to this vaccine? no    Any prior history of allergic reactions to egg and/or gelatin? no    Do you have a sensitivity to the preservative Thimersol? no    Do you have a past history of Guillan-Barre Syndrome? no    Do you currently have an acute febrile illness? no    Have you ever had a severe reaction to latex? no    Vaccine information given and explained to patient? yes    Are you currently pregnant? no  Orders Added: 1)  Influenza Vaccine MCR [[id number]]

## 2010-11-02 NOTE — Progress Notes (Signed)
Summary: Refill/gh  Phone Note Refill Request   Refills Requested: Medication #1:  PREDNISONE 10 MG  TABS Take 1 tablet by mouth once a day   Last Refilled: 05/08/2010  Medication #2:  METFORMIN HCL 1000 MG TABS Take 1 tablet by mouth two times a day for your diabetes  Method Requested: Electronic Initial call taken by: Angelina Ok RN,  June 08, 2010 3:45 PM Caller: Patient Call For: Stephanie Horne  Follow-up for Phone Call        Rx written Follow-up by: Stephanie Horne,  June 10, 2010 3:19 PM    Prescriptions: METFORMIN HCL 1000 MG TABS (METFORMIN HCL) Take 1 tablet by mouth two times a day for your diabetes  #60 x 3   Entered and Authorized by:   Stephanie Horne   Signed by:   Stephanie Horne on 06/10/2010   Method used:   [facility]        [facility]  [address] [phone]* (retail)       [address]       [place], [place]  [address]       Ph: [phone] or [phone]       Fax: [phone]   RxID:   [id number] PREDNISONE 10 MG  TABS (PREDNISONE) Take 1 tablet by mouth once a day  #30 x 11   Entered and Authorized by:   Stephanie Horne   Signed by:   Stephanie Horne on 06/10/2010   Method used:   [facility]        [facility]  [address] [phone]* (retail)       [address]       [place], [place]  [address]       Ph: [phone] or [phone]       Fax: [phone]   RxID:   [id number]

## 2010-11-02 NOTE — Progress Notes (Signed)
Summary: test strips prescription/dmr  Phone Note Outgoing Call   Call placed by: Jamison Neighbor RD,CDE,  October 14, 2009 4:35 PM Summary of Call: call transferred frOm triaGE nurse from patient who has a questions abouther test strips.  She says that she has been testing 4x/day and her prescription is only for 1x/day.  i EXPLAINED THAT UNLESS SHE IS ON INSULIN OR HAVING DIFFICULTY WITH BLOOD SUGARS THAT MEDICARE ONLY COVERS 1 STRIP/DAY.   has not had meter reading records scanned since 01/2009 and then was only testing 2x/day. A1c is well controlled on only metformin- I think 1X/DAY testing is adequate. patient said she is willing to decrease to that if doctor is okay with her doing so.   patient also asked me to tell her doctor that she has tried, but is unable to get an appointment at the time he requested.  Follow-up for Phone Call        Information sent Follow-up by: Julaine Fusi  DO,  October 22, 2009 4:49 PM    Prescriptions: [place] TEST   STRP (GLUCOSE BLOOD) use to test blood glucose once daily Brand medically necessary #50 x 11   Entered and Authorized by:   Julaine Fusi  DO   Signed by:   Julaine Fusi  DO on 10/22/2009   Method used:   [facility]        [facility]  [address] [phone]* (retail)       [address]       [place], [place]  [address]       Ph: [phone] or [phone]       Fax: [phone]   RxID:   [id number]

## 2010-11-04 NOTE — Letter (Signed)
Summary: [facility]  [facility]   Imported By: Shon Hough 09/16/2010 09:18:47  _____________________________________________________________________  External Attachment:    Type:   Image     Comment:   External Document

## 2010-11-04 NOTE — Progress Notes (Signed)
Summary: Refill/gh  Phone Note Refill Request Message from:  Fax from Pharmacy on October 04, 2010 1:35 PM  Refills Requested: Medication #1:  METFORMIN HCL 1000 MG TABS Take 1 tablet by mouth two times a day for your diabetes   Last Refilled: 09/01/2010  Method Requested: Electronic Initial call taken by: Angelina Ok RN,  October 04, 2010 1:35 PM  Follow-up for Phone Call       Follow-up by: Clerance Lav MD,  October 04, 2010 3:20 PM    Prescriptions: METFORMIN HCL 1000 MG TABS (METFORMIN HCL) Take 1 tablet by mouth two times a day for your diabetes  #180 x 3   Entered and Authorized by:   Clerance Lav MD   Signed by:   Clerance Lav MD on 10/04/2010   Method used:   [facility]        [facility]  [address] [phone]* (retail)       [address]       [place], [place]  [address]       Ph: [phone] or [phone]       Fax: [phone]   RxID:   [id number]

## 2010-12-02 ENCOUNTER — Other Ambulatory Visit: Payer: Self-pay | Admitting: *Deleted

## 2010-12-03 MED ORDER — METOPROLOL TARTRATE 100 MG PO TABS: 100.0000 mg | ORAL_TABLET | Freq: Two times a day (BID) | ORAL | Status: DC

## 2010-12-31 ENCOUNTER — Encounter: Payer: Self-pay | Admitting: Internal Medicine

## 2011-01-04 ENCOUNTER — Encounter: Payer: Self-pay | Admitting: Internal Medicine

## 2011-01-11 ENCOUNTER — Other Ambulatory Visit: Payer: Self-pay | Admitting: *Deleted

## 2011-01-11 MED ORDER — FOLIC ACID 1 MG PO TABS: 1.0000 mg | ORAL_TABLET | Freq: Every day | ORAL | Status: DC

## 2011-01-11 MED ORDER — GLUCOSE BLOOD VI STRP: ORAL_STRIP | Status: AC

## 2011-01-13 ENCOUNTER — Telehealth: Payer: Self-pay | Admitting: Dietician

## 2011-01-13 LAB — GLUCOSE, CAPILLARY: Glucose-Capillary: 63 mg/dL — ABNORMAL LOW (ref 70–99)

## 2011-01-13 NOTE — Telephone Encounter (Signed)
Patient called in response to letter sent by Dr. Sherryll Burger that her eye exam is due. She requested that we schedule her appointment and also help her with figuring out why test strips were 114.00 when she says she hasn't gotten any since December and only tests one time a day.  [facility] said there was nothing wrong with the prescription, Medicare says she cannot refill until 02/28/11 because they have been filled elsewhere.  [facility] and set up appointment for Monday 4/16 @ 10"30 with dr. Janet Berlin for diabetic eye exam.  Patient called and notified about both of the above. She was also mailed an eye appointment notice.

## 2011-01-28 ENCOUNTER — Ambulatory Visit (INDEPENDENT_AMBULATORY_CARE_PROVIDER_SITE_OTHER): Payer: Self-pay | Admitting: Internal Medicine

## 2011-01-28 ENCOUNTER — Encounter: Payer: Self-pay | Admitting: Internal Medicine

## 2011-01-28 DIAGNOSIS — E785 Hyperlipidemia, unspecified: Secondary | ICD-10-CM

## 2011-01-28 DIAGNOSIS — M545 Low back pain: Secondary | ICD-10-CM

## 2011-01-28 DIAGNOSIS — I10 Essential (primary) hypertension: Secondary | ICD-10-CM

## 2011-01-28 DIAGNOSIS — I4891 Unspecified atrial fibrillation: Secondary | ICD-10-CM

## 2011-01-28 DIAGNOSIS — K219 Gastro-esophageal reflux disease without esophagitis: Secondary | ICD-10-CM

## 2011-01-28 DIAGNOSIS — R197 Diarrhea, unspecified: Secondary | ICD-10-CM | POA: Insufficient documentation

## 2011-01-28 DIAGNOSIS — E119 Type 2 diabetes mellitus without complications: Secondary | ICD-10-CM

## 2011-01-28 LAB — GLUCOSE, CAPILLARY: Glucose-Capillary: 155 mg/dL — ABNORMAL HIGH (ref 70–99)

## 2011-01-28 LAB — POCT GLYCOSYLATED HEMOGLOBIN (HGB A1C): Hemoglobin A1C: 6.1

## 2011-01-28 MED ORDER — LISINOPRIL 2.5 MG PO TABS: 2.5000 mg | ORAL_TABLET | Freq: Every day | ORAL | Status: DC

## 2011-01-28 MED ORDER — PREDNISONE 10 MG PO TABS: 10.0000 mg | ORAL_TABLET | Freq: Every day | ORAL | Status: DC

## 2011-01-28 MED ORDER — TRAMADOL HCL 50 MG PO TABS: 50.0000 mg | ORAL_TABLET | Freq: Four times a day (QID) | ORAL | Status: DC | PRN

## 2011-01-28 MED ORDER — OMEPRAZOLE 40 MG PO CPDR: 40.0000 mg | DELAYED_RELEASE_CAPSULE | Freq: Every day | ORAL | Status: DC

## 2011-01-28 MED ORDER — LOPERAMIDE HCL 2 MG PO TABS: 2.0000 mg | ORAL_TABLET | ORAL | Status: AC | PRN

## 2011-01-28 MED ORDER — NITROGLYCERIN 0.4 MG SL SUBL: 0.4000 mg | SUBLINGUAL_TABLET | SUBLINGUAL | Status: DC | PRN

## 2011-01-28 MED ORDER — FUROSEMIDE 40 MG PO TABS: 20.0000 mg | ORAL_TABLET | Freq: Every day | ORAL | Status: DC

## 2011-01-28 MED ORDER — METOPROLOL TARTRATE 100 MG PO TABS: 100.0000 mg | ORAL_TABLET | Freq: Two times a day (BID) | ORAL | Status: DC

## 2011-01-28 MED ORDER — METFORMIN HCL 1000 MG PO TABS: 1000.0000 mg | ORAL_TABLET | Freq: Two times a day (BID) | ORAL | Status: DC

## 2011-01-28 MED ORDER — PRAVASTATIN SODIUM 40 MG PO TABS: 40.0000 mg | ORAL_TABLET | Freq: Every day | ORAL | Status: DC

## 2011-01-28 NOTE — Assessment & Plan Note (Signed)
Continue current regimen, well controlled DM, eye exam normal. Feet exam normal.

## 2011-01-28 NOTE — Assessment & Plan Note (Signed)
Recheck CBC. 

## 2011-01-28 NOTE — Assessment & Plan Note (Signed)
Since diarrhea is persistant and without particular food relationship, I will do the workup starting with stool ova and cyst. She is using city/mineral water so Crypto is less likely. She is immunosuppressed with long term steroid use for AIHA (10mg  of prednisone). I will give her imodium for now but asked to call back if symptoms persists or worsens. Differential would include C diff (no abdominal pain, not toxic appearing), Crypto (ova, cyst would help differentiate), other parasitic infection, milk or diet intolerance.

## 2011-01-28 NOTE — Assessment & Plan Note (Signed)
Refill tramadol. NO futherstudies needed.

## 2011-01-28 NOTE — Assessment & Plan Note (Signed)
BP well controlled. No changes made at this time.

## 2011-01-28 NOTE — Assessment & Plan Note (Signed)
Patient reports that she does have some chest uneasiness, not sure if it is A fib/flutter or angina. She usually takes NTG and it goes away. She also relates this to recent emotional stress with passing away of her husband and other cousins. She is not too keen on going and consulting cardiologist. At this time, I have asked her to take NTG as needed but to go to cardiologist if her symptoms persists. In past she had GI bleed and therefore she was not taking warfarin.

## 2011-01-28 NOTE — Assessment & Plan Note (Signed)
Recheck lipid profile 

## 2011-01-28 NOTE — Patient Instructions (Signed)
Call us back if your diarrhea does not improve. Doctor will call you if anything abnormal in your lab results.

## 2011-01-28 NOTE — Progress Notes (Signed)
  Subjective:    Patient ID: Stephanie Horne, female    DOB: September 29, 1941, 70 y.o.   MRN: [id number]  HPI 70 year old female with past medical history of hypertension, diabetes, atrial fibrillation but now on anticoagulation due to personal preference, autoimmune hemolytic anemia and low back pain is here for followup.she recently lost her husband who also was a patient of mine to lung cancer. She is handling this well.  Patient request refill on regular medication. Patient reports that she is not been able to fill tramadol as it requires doctors authorization. Patient reports that she has continued low back pain and knee pain for which she would like to have some pain medication.  Patient reports that she has on and off diarrhea for last one month. She reports no bloating. She reports no abdominal pain associated with it. She thinks it is worsened by food intake. Today morning she had some oranges which increased her diarrhea. She does not recall having any food allergies. She denies any blood in the stool. She says that the peak time her diarrhea is about 4-6 times a day it is watery. She has not taken any antibiotics recently. She is not currently traveled recently. She does not recall any such prior episode.  Review of Systems  All other systems reviewed and are negative.       Objective:   Physical Exam  Constitutional: She is oriented to person, place, and time. She appears well-developed and well-nourished.  HENT:  Head: Normocephalic and atraumatic.  Right Ear: External ear normal.  Left Ear: External ear normal.  Eyes: Conjunctivae and EOM are normal. Pupils are equal, round, and reactive to light.  Neck: No JVD present. No tracheal deviation present. No thyromegaly present.  Cardiovascular: Normal rate, regular rhythm and normal heart sounds.  Exam reveals no gallop.   No murmur heard. Pulmonary/Chest: No respiratory distress. She has no wheezes. She has no rales. She exhibits no  tenderness.  Abdominal: Soft. Bowel sounds are normal. She exhibits no distension and no mass. There is no tenderness. There is no rebound and no guarding.  Musculoskeletal: Normal range of motion. She exhibits no edema and no tenderness.  Lymphadenopathy:    She has no cervical adenopathy.  Neurological: She is alert and oriented to person, place, and time. She has normal reflexes. No cranial nerve deficit. Coordination normal.  Skin: No rash noted. No erythema.  Psychiatric: She has a normal mood and affect. Her behavior is normal. Thought content normal.          Assessment & Plan:

## 2011-02-15 NOTE — Discharge Summary (Signed)
NAMEJANNAT, Stephanie Horne               ACCOUNT NO.:  [id number]   MEDICAL RECORD NO.:  [id number]          PATIENT TYPE:  INP   LOCATION:  2025                         FACILITY:  [facility]   PHYSICIAN:  Eliseo Gum, M.D.   DATE OF BIRTH:  March 21, 1941   DATE OF ADMISSION:  03/02/2008  DATE OF DISCHARGE:  03/07/2008                               DISCHARGE SUMMARY   PRIMARY CARE PHYSICIAN:  Chauncey Reading, D.O.   DISCHARGE DIAGNOSES:  1. Hyperosmolar hyperglycemic state, hyperosmolar, nonketotic.  2. Atrial fibrillation with rapid ventricular rate.  3. Autoimmune hemolytic anemia.  4. History of lupus.  5. Altered mental status secondary to hyperosmolar nonketotic.  6. Hyperlipidemia.  7. Hypertension.  8. History of seasonal allergies.  9. Thrombocytopenia.  10.History of mitral valve collapse.  11.Diabetes mellitus secondary to steroid use.  12.History of left lower lobe nodule.  13.Hepatic dysfunction, improving.  14.Cholelithiasis.   DISCHARGE MEDICATIONS:  1. Lantus 15 units subcutaneous daily in the evening.  2. Insulin NovoLog 100 units three times a day subcutaneous before      meal.  3. Aspirin 81 mg daily by mouth.  4. Lopressor 100 mg twice daily by mouth.  5. Prednisone 60 mg daily by mouth.  6. Pravachol 20 mg daily by mouth.  7. Fosamax 70 mg weekly by mouth.  8. Folic acid 1 mg daily by mouth.  9. Prevacid 20 mg daily by mouth.   DISPOSITION AND FOLLOWUP:  The patient is sent to home in stable  condition.  The patient has been given followup appointments as follows:  1. The patient will visit [facility] on      March 13, 2008, at 2 p.m. for blood check.  At the followup visit,      the patient will get a CBC and C-MET examination to check her      hemoglobin status, platelet as well as check resolution of liver      dysfunction.  2. The patient will visit Dr. Polly Cobia at [facility] on March 18, 2008 at  9:45.  At the followup visit, the      patient will reveiwed and her primary care needs will be addressed.      More specifically, the patient's insulin dose will be adjusted in      terms of her response.  A referral to diabetes nurse will be      considered at the followup visit.  The patient will also require a      followup referral on outpatient basis to her hematologist Dr. Cephas Darby in terms of her hemolytic anemia, and to her      rheumatologist, Dr. Phylliss Bob, in terms of questionable lupus.   STUDIES:  1. Chest x-ray on Mar 02, 2008, cardiomegaly with nonacute      cardiopulmonary process.  2. CT of head without contrast on Mar 02, 2008.  No acute intracranial      findings and no mass lesions.  No change compared to prior CT  examination.  3. Chest x-ray on March 05, 2008, status post PICC insertion, increased      basilar atelectasis.  4. Ultrasound abdomen on March 06, 2008 impression, cholelithiasis      within a contracted gallbladder.  Apparent thickening, it may be      nonspecific, it is may be secondary to hepatic dysfunction or      contracted state, acute cholecystitis is unlikely.  Small right      pleural effusion.  Pancreas and left kidney not well visualized.  5. Chest x-ray on March 06, 2008, impression, slight increased prominent      pulmonary vasculature in the lower lung zone without overt edema.   CONSULT:  No inpatient consults were requested during this hospital  admission.   ADMISSION HISTORY:  The patient is a 70 year old woman with past medical  history significant for atrial fibrillation not on Coumadin, only on  rate control treatment, lupus, newly diagnosed diabetes, autoimmune  hemolytic anemia, hypertension, hyperlipidemia, mitral valve prolapse,  and moderate mitral regurgitation who presented to the clinic with  increased weakness over the prior 3 days before admitting.  The patient  has been admitted to [facility] last month  for autoimmune  hemolytic anemia, atrial fibrillation, RVR, and Coumadin toxicity.  History was provided by the husband.  The patient's husband reported,  the patient had a 3-day history of increased weakness and lethargy.  Her  husband revealed, she has been taking all of her medications.  She did  not have any other complaints including chest pain, headache, blurred  vision, dizziness, cough, shortness of breath, fevers, chills, abdominal  pain, nausea, vomiting, diarrhea, cough, hematuria, melena, or  hematochezia.  The patient was found to have atrial fibrillation, RVR in  the ED, and was subsequently admitted.   ADMISSION PHYSICAL:  VITAL SIGNS:  Temperature 99, blood pressure  139/97, pulse 134, respiratory rate 23, and oxygen saturation 99% on  room air.  GENERAL:  Lethargic woman, opening eyes to command, but otherwise unable  to follow command.  EYES:  PERRLA, EOMI not assessed due to AMS.  ENT:  The patient would not open mouth.  NECK:  Supple, no carotid bruits.  CHEST:  Clear to auscultation bilaterally, anteriorly.  CARDIOVASCULAR:  Tachycardic, irregularly irregular.  ABDOMEN:  Soft and nontender.  Positive bowel sounds.  EXTREMITIES:  Bilateral lower extremity edema.  SKIN:  Multiple ecchymoses on bilateral lower extremities secondary to  Coumadin toxicity.  LYMPH:  No lymphadenopathy.  MUSCULOSKELETAL:  Moving all four limbs.  NEURO:  Lethargic, moves toes, squeezes fingers, but otherwise would not  follow command.   ADMISSION LABS:  Sodium 141, potassium 4.7, chloride 108, bicarbonate  23, BUN 24, creatinine 1, blood glucose more than 700, anion gap 10,  bilirubin 1.6, alk phos 76, AST 13, ALT 27, protein 6.9, albumin 4.1,  and calcium 9.9.  Hemoglobin 14.8, MCV 99, WBC 13.4, ANC 12.8, and  platelet 138.  PT 14.1, INR 1.1, UDS negative.  A repeat blood chemistry  reveals anion gap of 16, bilirubin 1.6, alk phos 76, AST 13, ALT 27,  protein 6.9, albumin 4.1, and  calcium 9.9.   HOSPITAL COURSE:  1. Hyperosmolar hyperglycemic state.  The patient presented with blood      glucose over 700 in a comatose state, although she did not have      anion gap initially.  Later on repeat blood examination, she was      found to have anion  gap of 16.  Her bicarbonate was within normal      limits.  Patient was admitted in step-down unit and was started on      IV insulin and intravenous hydration.  Her metformin was placed on      hold, and she was checked for occult infection with pan culture      which came to be negative.  Her CBG and the BMET as well as ins and      outs were closely monitored and intravenous fluid was accordingly      adjusted.  The patient responded well to this treatment, and her      hyperglycemia quickly normalized along with her mental status.  The      patient was later switched to Lantus along with sliding scale.      Based on her sliding scale insulin, the patient is sent home on      Lantus and short-acting insulin, for meal time coverage.  2. Atrial fibrillation with rapid ventricular rate.  The etiology of      this was unclear, but the patient is known to have chronic atrial      fibrillation and was finally on rate control treatment even      previous complications with Coumadin.  The patient was closely      monitored in ICU and started on Cardizem drip along with p.r.n.      metoprolol.  As mentioned above, pan culture was drawn along with      urinalysis to check for any infectious disease precipitating      tachycardia.  Her cardiac enzyme were measured and serial EKG,      electrocardiograms were obtained.  They did not reveal any acute      coronary syndrome.  The patient responded well to IV Cardizem and      was subsequently titrated off to p.o. Cardizem and metoprolol.  The      patient did have some problem with blood pressure after which the      patient's Cardizem drip was discontinued and later placed on p.o.       Cardizem and metoprolol only.  Eventually, the patient required      only metoprolol for rate control.  3. Autoimmune hemolytic anemia.  The patient was initially placed on      IV Solu-Medrol, but later switched to prednisone.  Her LDH was      elevated, but her haptoglobin was within normal limits.  The      patient was later switched to her usual prednisone dose of 60 mg      daily.  4. Lupus.  The patient was continued on her regular steroid as above.      However, it is noted that the patient is recently found to have      negative ANA and it is advisable that this diagnosis is reviewed on      outpatient basis.  5. Altered mental status.  This was most likely secondary to atrial      fibrillation RVR and hyperglycemic hyperosmolar state and managed      as above.  6. Liver dysfunction.  The etiology of her liver dysfunction was      unclear.  This was most likely related to severe dehydration and      shock related to hyperosmolar hyperglycemic state.  Her liver      function was closely monitored and in fact gradually trended down  with correction of her dehydration.  Of note, her right upper      quadrant ultrasound did not reveal any explanation for liver      dysfunction.  7. Thrombocytopenia.  The patient was noted to have low platelets.  In      the hospital, she had received Lovenox earlier in the admission and      her HIT screening came out to be negative.  Her DIC panel also      turned out to be noncontributory.  Her platelet count was as low as      76,000, but then started to improve.  This will have to be      monitored closely on outpatient basis.   DISCHARGE DAY LABS:  WBC 9.2, hemoglobin 11.7, platelets 93, sodium 144,  potassium 3.8, chloride 114, bicarbonate 23, glucose 60, BUN 5,  creatinine 0.5, bilirubin 0.6, alk phos 94, AST 64, ALT 98, total  protein 4.6, albumin 2.7, calcium 7.9, phosphorus 2.8, and magnesium 2.   DISCHARGE DAY VITALS:   Temperature 97.6, pulse 76, respirations 16,  blood pressure 117/83, and oxygen saturation 100% on room air.  On the  day of discharge, the patient's mental condition was on her baseline.  She was able to move about without assistance.  Other than that, her  physical exam did not change significantly compared to her admission  physical.      Zara Council, MD  Electronically Signed      Eliseo Gum, M.D.  Electronically Signed    AS/MEDQ  D:  03/12/2008  T:  03/13/2008  Job:  [id number]

## 2011-02-15 NOTE — Letter (Signed)
Mar 01, 2007    __________   RE:  WYONA, NEILS  MRN:  [id number]  /  DOB:  June 30, 1941   __________   We plan to repeat the 2-D echocardiogram.    Sincerely,      E. Graceann Congress, MD, [facility]  Electronically Signed    EJL/MedQ  DD: 03/01/2007  DT: 03/01/2007  Job #: [id number]

## 2011-02-15 NOTE — Discharge Summary (Signed)
Stephanie Horne, Stephanie Horne               ACCOUNT NO.:  [id number]   MEDICAL RECORD NO.:  [id number]          PATIENT TYPE:  INP   LOCATION:  6733                         FACILITY:  [facility]   PHYSICIAN:  Alvester Morin, M.D.  DATE OF BIRTH:  1941/02/28   DATE OF ADMISSION:  10/03/2007  DATE OF DISCHARGE:  10/08/2007                               DISCHARGE SUMMARY   DISCHARGE DIAGNOSES:  1. Autoimmune hemolytic anemia.  2. Coagulopathy.  3. Chronic atrial fibrillation.  4. Hyperbilirubinemia.  5. Bilateral elbow hematoma.  6. Lupus on chronic steroids.  7. Rheumatoid arthritis.  8. Left lower lobe pulmonary nodule approximately 7-mm.  9. Moderate mitral regurgitation.  10.Allergic rhinitis.  11.Chronic low back pain.  12.Hypertension.  13.Hyperlipidemia.   DISCHARGE MEDICATIONS:  1. Claritin 10 mg daily.  2. Coumadin 5 mg daily.  3. Lopressor 25 mg b.i.d.  4. Prednisone 60 mg daily.  5. Imuran 50 mg daily.  6. Folic acid 1 mg daily.  7. Nitroglycerin 0.4 mg sublingual every 5 minutes x3 p.r.n.  Call if      pain still persists after three doses.  8. Fosamax 70 mg p.o. weekly.  9. Os-Cal 600 mg b.i.d.   FOLLOW UP:  The patient will follow up with her cardiologist, Dr.  Excell Seltzer, on October 10, 2007 for an INR check.  She has chronic atrial  fibrillation and was supratherapeutic on her Coumadin with bleeding  requiring admission to the hospital.  She was given Vitamin K and her  Coumadin was held. INR was 1.4 the day prior to discharge.  Coumadin was  restarted at a half dose.  She will follow up with her rheumatologist, Dr. Phylliss Bob, in 1 week.  The  patient was started on p.o. prednisone 60 mg daily as well as Imuran 50  mg daily for her autoimmune hemolytic anemia as well as treatment for  lupus.  Dr. Phylliss Bob will adjust these medications as needed.  She will follow up in the [facility] on October 12, 2007, at 11 a.m.  She will get a CBC at this time to make sure  that  hemoglobin remains stable.  It was approximately 9 at discharge.  The  patient should also be scheduled for a chest CT in 6 - 12 months for  follow up of pulmonary nodules seen on CT.   DIAGNOSTIC FINDINGS:  CT of head on October 03, 2007, with impression  of no acute intracranial abnormalities.  Bilateral elbow x-rays on  October 04, 2007, posterior soft tissue swelling bilaterally, but no  acute bony abnormalities.  CT of abdomen and pelvis on October 04, 2007,  no intraperitoneal free fluid, no adnexal mass.  Bladder is  nondistended, no pelvic lymphadenopathy.  No evidence of retroperitoneal  hematoma.  No acute findings in the abdomen, however, small pulmonary  nodules may be related to atelectasis and scarring seen and followup is  suggested.  A repeat chest CT in approximately 6-12 months is  recommended.  These left lower lobe nodules are approximately 7-mm.   Urine culture on October 04, 2007, showed  80,000 dipthteroids.  On  October 03, 2007, no significant growth.   CONSULTATIONS:  Hematology/oncology, Dr. Cyndie Chime.   HISTORY OF PRESENT ILLNESS:  Stephanie Horne is a 70 year old, African  American female with past medical history significant for atrial  fibrillation on chronic Coumadin, lupus, hypertension, hyperlipidemia  who presented to the ED with bilateral elbow pain with increased  swelling and skin discoloration of both elbows and forearms.  The  patient was seen at the [facility] approximately 1  week before admission and was diagnosed with olecranon bursitis.  However, the swelling had gotten worse.  The patient also reports  generalized weakness which had been increasing over the past 4 days  prior to admission.  No medications were changed when she was in the  clinic and she was continued on chronic prednisone without changing  dosing.  The patient reports the skin discoloration also started 4 days  prior to admission and is progressively  worse.  Her last INR checked was  a couple of weeks prior to admission and was 1.5, according to the  patient.  She denies any history of trauma, headache, dizziness, chest  pain, shortness of breath, fevers, chills, abdominal pain, no nausea,  vomiting, diarrhea, melena, hematochezia, emesis, dysuria or hematuria.  She does endorse bilateral calf pain with ambulation, but has no other  complaints.   ALLERGIES:  No known drug allergies.   PAST MEDICAL HISTORY:  See as dictated above.   MEDICATIONS:  1. Coumadin 10 mg titrated by Dr. Excell Seltzer.  2. Prednisone 2.5 mg daily.  3. Fosamax 70 mg p.o. weekly.  4. Hydrochlorothiazide 25 mg p.o. daily.  5. Enalapril 10 mg p.o. daily.  6. Os-Cal 600 mg b.i.d.  7. Fish oil.  8. Hydroxychloroquine 200 mg daily.  9. Zyrtec 10 mg daily.  10.Nitro-Quick 0.4 mg sublingual as needed.   SOCIAL HISTORY:  The patient is a former smoker of half pack per day x25  years, quit 3 years ago.  Married.  She is a retired Electrical engineer.  She lives with her husband in [place].   REVIEW OF SYSTEMS:  Positive for some joint pain, joint swelling, back  pain and weakness, but was otherwise negative except as noted in the  HPI.   PHYSICAL EXAMINATION:  VITAL SIGNS:  Temperature 97.8, blood pressure  152/68, pulse 130, respirations 16, saturations 96% on room air.  GENERAL:  The patient was in no acute distress.  HEENT:  Extraocular movements intact.  No nystagmus.  Pupils equal round  and reactive to light and accommodation.  Moist oral mucosa.  Positive  for food particles.  NECK:  Supple.  No JVD or carotid bruits.  CARDIAC:  Clear to auscultation bilaterally.  CARDIAC:  Normal S1, S2, but irregularly, irregular.  No murmurs, rubs  or gallops.  ABDOMEN:  Soft, nontender, obese, positive bowel sounds.  No rebound or  guarding.  EXTREMITIES:  Bilateral lower extremity edema, left greater than right.  Bilateral calves are tender to palpation.  Bilateral  elbows are tender  to palpation as well with the left elbow having more fluid than the  right elbow.  The fluid extends proximally amount 3-cm across the elbow  distally down to the forearm approximately 10-cm.  There is pretty  symmetric on both sides.  SKIN:  There is darkening of bilateral elbows up and down the posterior  aspect of the arms. No jaundice noted.  LYMPHATICS:  Within normal limits.  MUSCULOSKELETAL:  The patient  appears to be moving all four extremities,  but does have some decreased movement in the right lower extremity  secondary to pain. Elbows full range of motion but limited by pain.  NEUROLOGIC:  Alert and oriented x3.  Motor is 4/5 in the upper  extremities, once again limited by pain.  Sensation is intact to light  touch.  Cerebellar function is intact and DTRs are symmetric and normal.   LABORATORY DATA AND X-RAY FINDINGS:  White count 13.9, hemoglobin 7.5,  platelets 153, MCV 94, RDW 15.  Of note, her hemoglobin on July 06, 2007, was 15.1.  PT greater than 90 and INR greater than 10.8. Sodium  133, potassium 3.6, chloride 101, bicarb 24, BUN 10, creatinine 0.67,  glucose 135.  Anion gap is 8, bilirubin 1.3, Alk phos 42, AST 33, ALT  19, protein 5.9, albumin 3.8, calcium 8.7.  Urinalysis showed amber,  cloudy urine with some small ketones, moderate leukocyte esterase, 7-10  wbc's and 21-50 rbc's with many bacteria.   HOSPITAL COURSE:  Problem 1.  ANEMIA:  The patient's hemoglobin  approximately 2 months prior to admission was 15 and on presentation in  the ED was a 7.5.  This hemoglobin was repeated and dropped to 5.8  twelve hours after admission.  The patient was transfused 4 units of  packed red blood cells.  CT scan of her abdomen and pelvis was obtained  to look for any retroperitoneal blood or hematomas.  CT was negative for  bleeding. Patient denied hematochezia or hematemesis and had had a  colonoscopy within the past 6 months. Therefore, a GI  source for her  anemia was not likely. In addition,  LDH was elevated at 518 and  haptoglobin was less than 6.  The patient's bilirubin was elevated at  1.3 and increased to 1.8, primarily indirect.  However, blood smear was  negative for schistocytes and DAT was negative. It was felt that the  patient's anemia was due to hemolysis and hematology was consulted.  Dr.  Cyndie Chime agreed that this was autoimmune hemolytic anemia in this  patient with longstanding lupus, despite the negative DAT.  The patient  was treated with prednisone at 1 mg/kg per day.  She was also started on  folic acid and CBCs were monitored.  Dr. Phylliss Bob was contacted, who was the  patient's rheumatologist, and the current hospital course was discussed  with him.  Imuran was recommended by Dr. Cyndie Chime and per Dr. Phylliss Bob,  he was in agreement with this drug and it was started at 50 mg daily on  at the day of discharge.  The patient will follow up with Dr. Phylliss Bob in  approximately 1 week on these current medication doses and Dr. Phylliss Bob can  adjust the medications.  After transfusion, the patient's hemoglobin  remained stable in the 9's and the patient's hemoglobin did not drop  below 9.5.  On the day of discharge, the patient's hemoglobin was stable  at 9.5.  The patient will have a hemoglobin checked in approximately 1  week at the outpatient clinic when she sees her doctor, Dr Landis Martins.   Problem 2.  LETHARGY:  This is likely related to the patient's anemia  and possibly from low blood pressure (see problem # 6).   Problem 3.  COAGULOPATHY:  The patient is on Coumadin for her chronic  atrial fibrillation.  She was on 10 mg daily and recently had her  Coumadin adjusted for a subtherapeutic INR. Her INR  on admission was  supratherapeutic with PT greater than 90 and INR greater than 10.8.  Her  Coumadin was held and she was given Vitamin K 10mg  IV.  The bilateral  diffuse ecchymoses of both arms were likely due to the  elevated PT.  On  the day prior to discharge, her INR had decreased to 1.4.  The patient  was discharged on Coumadin 5 mg daily which is half of her previous  dose. An INR check was scheduled 2 days after discharge with Dr. Excell Seltzer.  He can adjust her Coumadin dosing at this time.   Problem 4.  BILATERAL ELBOW and FOREARM SWELLING:  Secondary to  superficial bleed from supratherapeutic INR.   Problem 5.  CHRONIC ATRIAL FIBRILLATION:  The patient remained in atrial  fibrillation during her hospital course.  She was rate controlled and  was continued on her Lopressor.  Her coumadin was adjusted as outlined  above (see problem # 3).   Problem 6.  HYPERTENSION:  The patient's blood pressure remained low  normal throughout her hospital course ranging from systolics of 100-120  off of her antihypertensives. Therefore, these were not restarted at the  time of discharge. These can be readdressed in the outpatient setting  and restarted if the patient's blood pressure will tolerate them. The  patient was kept on Lopressor for her atrial fibrillation.   Problem 7.  HYPOKALEMIA:  The patient's potassium was decreased at 2.6.  This was presumed to be secondary to her HCTZ.  The patient's potassium  was replaced orally and subsequently normalized. This was not a problem  throughout the rest of her hospital course. Her HCTZ was being held at  time or discharge (see problem #6).   Problem 8.  LEUKOCYTOSIS:  This was likely secondary to her steroids and  remained only mildly elevated.  The patient had no other signs or  symptoms of infection and remained afebrile.   Problem 9.  PYURIA:  The patient had asymptomatic pyuria that was not  treated.  Urine culture was taken twice and both were negative.   Problem 10. SLE - Followed by Dr Phylliss Bob as outlined above (see problem  #1).   DISCHARGE PHYSICAL EXAMINATION:  VITAL SIGNS:  Temperature 97.9, pulse  84, respirations 20, blood pressure 102/55,  saturations 100% on room  air.   DISCHARGE LABORATORY DATA AND X-RAY FINDINGS:  White count 8.3,  hemoglobin 9.5, platelets 104.  Electrolytes with sodium 140, potassium  3.5, chloride 108, bicarb 27, BUN 14, creatinine 0.68, glucose 123.  The  patient's bilirubin was 1.4, Alk phos 58, AST 38, ALT 39, protein 6.3,  albumin 3.7, calcium 8.9.      Tacey Ruiz, MD  Electronically Signed      Alvester Morin, M.D.  Electronically Signed    JP/MEDQ  D:  10/08/2007  T:  10/08/2007  Job:  [id number]   cc:   Areatha Keas, M.D.  Chauncey Reading, D.O.  Veverly Fells. Excell Seltzer, MD

## 2011-02-15 NOTE — Consult Note (Signed)
NAMEWINSTON, Stephanie Horne               ACCOUNT NO.:  [id number]   MEDICAL RECORD NO.:  [id number]          PATIENT TYPE:  INP   LOCATION:  6733                         FACILITY:  [facility]   PHYSICIAN:  Genene Churn. Granfortuna, M.D.DATE OF BIRTH:  Aug 16, 1941   DATE OF CONSULTATION:  10/05/2007  DATE OF DISCHARGE:                                 CONSULTATION   REASON FOR CONSULTATION:  Hemolytic anemia.   HISTORY OF PRESENT ILLNESS:  Stephanie Horne is a pleasant 70 year old  African American female with a longstanding history of lupus, with  prominent skin and joint involvement currently controlled on low-dose  prednisone at 2.5 mg daily, and on Plaquenil in the past.  The patient  is under the care of Dr. Chase Picket.   The patient presents with progressive weakness, recurrent left olecranon  bursitis and is found to have significant normochromic anemia, with a  hemoglobin of 6.8, MCV of 92, leukocytosis with white count of 13.7 and  mild thrombocytopenia with platelets 111,000.  There was no recent  infection, or new medications.  There were no prior transfusions.  On  admission, she did receive a total of 4 units, to improve her  hemoglobin, currently at 9.8.  In addition, she had supratherapeutic INR  of 10.8, receiving vitamin K, with improvement of these lab values.  Her  INR today is 1.4.  X-ray showed no abdominal adenopathy or splenomegaly.  There was a 7 mm left lower lobe pulmonary nodule seen.  Additional labs  revealed retic count of 7.9%, LDH of 518, total bilirubin 1.7, AST of  25, low haptoglobin less than six, Coombs negative.  Her sed rate is 4.  Urinalysis reveals blood, but this may be spurious, due to her increase  in INR on Coumadin.  Review of the peripheral blood film, shows  hypochromic, microcytic anemia, 2+ polychromasia,  less than 1+  Spherocytes.  No schistocytes.  Based on these findings, we were asked  to see the patient, rule out hemolytic anemia, in the setting of  a  history of lupus.   PAST MEDICAL HISTORY:  1. Lupus, on Plaquenil in the past, currently on steroids.  2. CAD.  3. Hypertension.  4. Chronic atrial fibrillation on Coumadin.  5. Moderate mitral regurgitation, ejection fraction of 55-65%.  6. Hyperlipidemia.  7. History of tobacco abuse in the past.  8. History of superficial phlebitis.  9. Allergic rhinitis.  10.Questionable UTI this admission.  11.History of rheumatoid arthritis.  12.New 7 mm pulmonary nodule bilaterally, at the base, per abdominal      CT.  13.No blood transfusion prior to this admission.   PAST SURGICAL HISTORY:  None.   ALLERGIES:  NKDA.   MEDICATIONS:  1. Os-Cal 500 mg b.i.d.  2. Lidocaine 50 mg times one.  3. Claritin 10 mg daily.  4. Magnesium sulfate 2 grams IV times one.  5. Lopressor 25 mg b.i.d.  6. Protonix 40 mg daily.  7. KCl 40 mEq times one.  8. Prednisone 2.5 mg daily.  9. Coumadin per pharmacy.   REVIEW OF SYSTEMS:  Remarkable for dyspnea on  exertion, dark urine,  fatigue, bilateral elbow pain, first on the left, but one week ago has  been appearing on the right elbow since Sunday.  There is swelling at  the olecranon.  She denies any headaches, confusion, double vision,  dysphagia, GERD symptoms, cough, chest pain, nausea, vomiting, diarrhea  or constipation.  Denies blood in the stools microscopically.  No  dysuria.  No numbness, fever, chills or night sweats.   FAMILY HISTORY:  Mother alive with CHF.  Father died of unknown causes,  one sister dead of unknown causes, so is one brother deceased.   SOCIAL HISTORY:  The patient is married, she has children, daughter Delice Bison  is her main contact.  She is with her at the time of examination.  She  is a prior tobacco user, smoked half pack a day for at least 25 years,  quitting 3 years ago.  No alcohol history.  She is a retired Engineer, site.  Lives in [place].  [employer].   HEALTH MAINTENANCE:  Her last colonoscopy was in 2008,  for 3 polys which  were negative.  Her last bone density scan was in September 2005.  She  is up-to-date with vaccines.  Her last physical was 1 year ago along  with mammogram, all negative.   PHYSICAL EXAMINATION:  GENERAL:  This is a 70 year old African American  female in no acute distress, alert and oriented x3.  VITAL SIGNS:  Blood pressure 115/75, +90, respirations 20, temperature  98.9, pulse oximetry 99% in room air.  HEENT:  Normocephalic, atraumatic.  PERRL.  Oral cavity without lesions  or thrush.  No icteric sclera.  NECK:  Supple.  No cervical or supraclavicular masses.  LUNGS:  Remarkable for slightly decreased breath sounds in the left  lower lung, no rhonchi or rales.  No axillary masses.  CARDIOVASCULAR:  Irregularly irregular rate and rhythm with 1/6 systolic  murmur.  No rubs or gallops.  ABDOMEN:  Moderately obese, nontender.  Bowel sounds x4.  No  hepatosplenomegaly.  EXTREMITIES:  With no lesions, no clubbing or cyanosis, bilateral ankle  edema about 1+, no inguinal adenopathy.  Her elbows are swollen, more  pronounced on the left, where she has elbow fusion.  Seen, as mentioned  above, she has no lesions, or petechial rash.  No jaundiced.  However,  there is bruising at the elbow area, with effusion.  BREASTS:  Without masses.  MUSCULOSKELETAL:  With no spinal tenderness.  NEUROLOGICAL:  Nonfocal .   LABORATORY DATA:  Hemoglobin 9.9, hematocrit 28.3, white count 15.7,  platelets 110, MCV 91.4, ANC 12.5, monocytes 0.4, lymphocytes 1.0,  reticulocyte count 145.4, reticulocyte percent 7.9, folic acid 9.6, B12  772, iron 116, TIBC 263, percent saturation 44, ferritin 101, LDH 518,  PTT 27.  PT 17.5, INR 1.4.  Haptoglobin less than 6.  Sodium 137,  potassium 3.9, BUN 7, creatinine 0.6, glucose 127, total bilirubin 2.5,  was 1.3 on admission., AST 39, total bilirubin 2.5, was 1.3 on  admission.  Direct bilirubin 0.14, indirect bilirubin 2.1.  AST 39, ALT  15,  alkaline phosphatase 39, total protein 5.5, albumin 3.5, calcium  8.2.  TSH 0.793, magnesium 1.8, urine culture negative.  Troponin I  negative, RPR negative.  Urinalysis on October 04, 2007,  was showing  urine amber, large hemoglobin, bilirubin, ketones, 30 of protein,  moderate leukocyte esterase.  Antiphospholipid syndrome evaluation is  pending.  DAT, IgG - complement negative.   RADIOLOGY:  CT of the  abdomen and pelvis with no contrast shows  bilateral small pulmonary nodules, 7 mm at the base, otherwise negative.  No retroperitoneal hematoma.   Elbow x-ray shows posterior soft tissue swelling, with no acute bony  abnormality.   CT of the head on October 03, 2007, with no contrast shows no acute  abnormalities, no atrophy, and chronic small vessel disease.   ASSESSMENT:  70 year old [place] American female, we are asked to see  for evaluation of  hemolytic anemia.  In view of her longstanding lupus, despite a negative COOMB's test, this  most likely represents immune hemolytic anemia. No evidence for a  microangiopathic process.   RECOMMENDATIONS:  1. Increase prednisone to 1 mg/kg per day.  2. Folic acid 1 mg a day.  3. Monitor CBC, with differential and retic count.  4. Check with Dr. Phylliss Bob on Monday, for recent labs, and consider adding      azathioprine to the steroids.  5. Check current ANA level.   Thank you very much for allowing [place] the opportunity to participate in  the care of Ms. Veith.  Will follow on the results with further  recommendations.      Marlowe Kays, P.A.      Genene Churn. Cyndie Chime, M.D.  Electronically Signed    SW/MEDQ  D:  10/08/2007  T:  10/08/2007  Job:  [id number]   cc:   Chauncey Reading, D.O.  Areatha Keas, M.D.

## 2011-02-15 NOTE — Assessment & Plan Note (Signed)
[facility]                            CARDIOLOGY OFFICE NOTE   Tiearra, Colwell Stephanie Horne                      MRN:          [id number]  DATE:10/10/2007                            DOB:          November 09, 1940    Stephanie Horne was seen in followup at the [facility] on  October 10, 2007.  [name] has been followed in the past by Dr. Glennon Hamilton.  I will be assuming her care from here forward.  [name] is  a 70 year old woman with chronic atrial fibrillation.  She was recently  hospitalized at [facility] from December 31 through January 5 with a  supra-therapeutic INR and superficial hematomas involving both elbows.  She also has autoimmune hemolytic anemia.  She required 4 units of  packed red blood cells during this hospitalization.  In reviewing her  records and speaking with Shelby Dubin in our Coumadin clinic, it appears  that [name] has missed many appointments and has not had her INR  followed closely at all.  She went from Feb 06, 2007 until December 5  with no INR checks.  In that interval, there were several letters and  phone calls from our office trying to get her in for INR checks.   Since discharge home from the hospital, [name] complains of  generalized fatigue.  She also has diffuse edema.  Otherwise, she is in  her normal state of health.  She denies chest pain, dyspnea, orthopnea,  or PND.   CURRENT MEDICATIONS:  1. Fosamax 70 mg weekly.  2. Coumadin as directed.  3. Claritin 10 mg daily.  4. Lopressor 25 mg twice daily.  5. Prednisone 60 mg daily.  6. Imuran 50 mg daily.  7. Os-Cal 600 mg twice daily.   ALLERGIES:  No known drug allergies.   EXAM:  The patient is alert and oriented.  She is in no acute distress.  Her weight is 210 pounds, blood pressure 131/84, heart rate is 89,  respiratory rate is 16.  HEENT:  Normal.  NECK:  Normal carotid upstrokes without bruits.  Jugular venous pressure  is mildly  increased.  LUNGS:  Clear bilaterally.  HEART:  Irregularly irregular with a 2/6 holosystolic murmur along the  left sternal border out to the apex.  There are no diastolic murmurs or  gallops.  ABDOMEN:  Soft and nontender.  No organomegaly.  EXTREMITIES:  There is no clubbing or cyanosis.  There is edema of the  upper and lower extremities with trace to 1+ edema of the arms and 1+  edema of the lower legs.  Peripheral pulses are 2+ and equal throughout.   EKG shows atrial fibrillation with no significant ST segment or T wave  changes.   ASSESSMENT:  1. Chronic atrial fibrillation.  Rate control is reasonable on      Lopressor.  The main issue here is compliance with Coumadin and      risk of anticoagulation.  [name] and I had a frank discussion      about the dangers of not closely following  her INR.  Her stroke      risk is significant in the setting of hypertension, chronic atrial      fibrillation, and mitral valve disease, although mitral stenosis is      not present.  However, I believe her bleeding risk is excessive if      she is unable to make her appointments.  She feels that she is      going to be able to follow more closely.  I am going to keep her on      Coumadin for the present time, and if she misses another      appointment in the Coumadin Clinic without closely rescheduling,      then I think her Coumadin will need to be stopped.  2. Edema.  I suspect she has some volume excess from her blood      transfusions in the hospital.  I am going to give her a temporary      course of Lasix 40 mg daily with potassium 20 mEq daily.  She is      coming in next Tuesday for an INR check, and we will follow up with      BM[facility] at that same time to assure that her creatinine and potassium      are stable.  A 2-week prescription was written today.  3. Moderate mitral regurgitation.  The patient's left ventricular size      is normal and left ventricular ejection fraction is  preserved at      65%.  Continue medical therapy.   For followup, I would like to see [name] back in 3 months.     Veverly Fells. Excell Seltzer, MD  Electronically Signed    MDC/MedQ  DD: 10/10/2007  DT: 10/10/2007  Job #: [id number]   cc:   Shelby Dubin, PharmD, BCPS, CPP  Areatha Keas, M.D.  Chauncey Reading, D.O.

## 2011-02-15 NOTE — Discharge Summary (Signed)
Stephanie Horne, Stephanie Horne               ACCOUNT NO.:  [id number]   MEDICAL RECORD NO.:  [id number]          PATIENT TYPE:  INP   LOCATION:  5120                         FACILITY:  [facility]   PHYSICIAN:  Alvester Morin, M.D.  DATE OF BIRTH:  [birth year]   DATE OF ADMISSION:  02/04/2008  DATE OF DISCHARGE:  02/07/2008                               DISCHARGE SUMMARY   DISCHARGE DIAGNOSES:  1. Hemolytic anemia, autoimmune.  2. Coumadin toxicity with bilateral arm hematomas.  3. Atrial fibrillation with rapid ventricular rate.  4. History of mitral valve prolapse.  5. Lupus, on chronic steroids.  6. Hyperlipidemia.  7. Hypertension.  8. New onset diabetes.  9. History of left lower lobe nodule, 7 mm in diameter.  10.Allergic rhinitis.  11.History of recent nausea and diarrhea.   DISCHARGE MEDICATIONS:  1. Prednisone 60 mg p.o. daily.  2. Aspirin 325 mg p.o. daily.  3. Tramadol 50 mg every 8 hours as needed for pain.  4. Metoprolol 75 mg p.o. twice a day.  5. Claritin 10 mg p.o. daily.  6. Folic acid 1 mg p.o. daily.  7. Fosamax 70 mg p.o. weekly.  8. Fish oil as directed.  9. Os-Cal D 600/200 p.o. twice daily.  10.Metformin 500 mg p.o. twice daily.  11.NitroQuick 0.4 mg by mouth sublingual every 5 minutes as needed for      chest pain x3.  12.Prevacid 20 mg p.o. daily.  13.Ambien 5 mg p.o. nightly as needed for sleep.   HOSPITAL FOLLOW UP:  The patient will follow up with Dr. Cyndie Chime and  Dr. Beverely Pace to address her hemolytic anemia in addition to her other  chronic medical problems including atrial fibrillation, lupus, and  hypertension.   PROCEDURES PERFORMED:  1. Chest x-ray performed Feb 05, 2008, indicating left-sided PICC line      placement.  Left lower lobe atelectasis.  2. CT scan of the chest Feb 06, 2008, indicating a pleural-based right      lower lobe nodule, unchanged from prior films indicating 5 mm in      diameter.  There is also pleural parenchymal thickening  and      nodularity at the left lower lobe suggesting chronic inflammation,      scarring that has been stable in appearance.   RECOMMENDATIONS:  Recommendation is for another CT scan of the chest to  follow up these nodules in 12 months.   CONSULTATIONS:  The patient was consulted by hematology/oncology,  specifically Dr. Cyndie Chime.  He made recommendations in treatment of  the patient's hemolytic anemia.   HISTORY OF PRESENT ILLNESS:  This is a 70 year old female with a history  of atrial fibrillation on Coumadin, lupus, hypertension, and history of  hemolytic anemia, and Coumadin toxicity in January 2009.  She comes in  after a week of generalized weakness, the emergent of spontaneous  hematoma in the left lower arm and right upper arm as well.  She has had  an acute on chronic worsening of her peripheral edema over that time as  well.   PHYSICAL EXAMINATION:  VITAL SIGNS:  Admitting  vitals, temperature 97.7,  blood pressure 139/73, pulse 114, respirations 19, and sating 100% on  room air.  GENERAL:  No acute distress.  EYES:  Extraocular movements intact.  Pupils equally round and reactive  to light.  Anicteric.  ENT:  No oropharyngeal edema or erythema.  NECK:  No JVD.  RESPIRATORY:  Lungs are clear bilaterally.  Good air movement.  CARDIOVASCULAR:  Irregularly irregular and tachycardic.  No murmurs,  rubs, or gallops.  GI:  Soft, nontender, nondistended, with good bowel sounds.  EXTREMITIES:  +3 pitting edema up to the knees bilaterally.  NEUROLOGIC:  Nonfocal.  Cranial nerves II through XII intact.  PSYCH:  Oriented, alert, and appropriate.   LABORATORY DATA:  Admitting labs; sodium 138, potassium 3.5, chloride  100, bicarb 24, BUN 19, creatinine 0.78, glucose 227, hemoglobin 7.4,  white count is 10.9, platelets are 182.  LDH is 547, INR is 7.0.   HOSPITAL COURSE BY PROBLEM:  1. Anemia.  The patient presented with a hemoglobin of 7.4.  Her last      hemoglobin on  January 22, 2008 was 13.  Her LDH was 547 and her T-      bili was 1.3 and her haptoglobin was low. It was felt that she was      likely hemolyzing because of her lupus.  She was placed on high      doses of  Solu-Medrol for the first 2 days of her admission.  She      was transfused 4 units total, placed in the step-down unit and we      obtained CBCs q.12 hours.  We also obtained a hematology consult.      Twenty four hours after the patient's admission and after being      transfused her hemoglobin was 9.9.  Her LDH continued to rise to      748.  We obtained an ANA that was negative.  Reticulocyte count was      8.1.  Haptoglobin remained low on even the day of discharge.  A      chromogenic factor X that was 11.4.  We increased this patient's      steroids during the hospitalization and discharged her also on an      increased dose of prednisone 60 mg p.o. daily.  She will follow up      with Dr. Cyndie Chime in regards to her hemolytic anemia.  We will      follow her up in 2 weeks to measure her CBC and her hemoglobin as      well.  Her symptoms in regards to generalized weakness and      shortness of breath resolved by the time of her discharge.  2. Coumadin toxicity.  The patient had an INR of 7.0.  She had a prior      incident of Coumadin toxicity in the past.  Apparently, she has      great difficulty in maintaining a stable INR between 2 and 3.  We      ultimately decided that the patient was no longer a good Coumadin      candidate given her falls risk, limited mobility because of back      pain, her desire not to take coumadin and her recurrent coumadin      toxicity.  We will treat her for atrial fibrillation with rate      control and with aspirin 325.  3. Atrial fibrillation.  The  patient's heart rate was in the 100s.      She was taking metoprolol 75 p.o. twice daily.  By the time of      discharge, the patient's heart rate was between 59 and 85.  We felt      that she had  obtained good control as her hemoglobin rose.  We      discharged her on the same medication metoprolol 75 p.o. twice      daily.  She will need to be followed with regards to her heart rate      in the outpatient clinic.  Again, we will use aspirin 325 to help      prevent strokes in her in the future.  4. Left lower lobe nodule.  We obtained a CT scan of the chest that      showed that the nodule had not grown significantly.  She needs      another CT scan of the chest in 12 months to follow this lesion.      She does not need a contrasted study to follow this lesion.  5. Diabetes.  The patient had fasting blood glucoses consistently      above 200.  This is also likely influence of the fact that she was      high-dose steroid while in the hospital.  She did have a hemoglobin      A1c of 6.5.  We will treat the patient for new-onset diabetes with      metformin 500 twice daily.  Her creatinine while in the hospital      was below 1.5.  We will follow her up in clinic and have her see a      diabetic specialist on the outpatient basis.   DISCHARGE LABORATORY:  Hemoglobin 9.1, white count 14.4, and platelets  156.   DISCHARGE VITALS:  Blood pressure 101/60, pulse 59, respirations 18,  sating 96% on room air, and temperature 99.1.      Lollie Sails, MD  Electronically Signed      Alvester Morin, M.D.  Electronically Signed    CB/MEDQ  D:  02/27/2008  T:  02/28/2008  Job:  [id number]   cc:   Genene Churn. Cyndie Chime, M.D.

## 2011-02-15 NOTE — Letter (Signed)
Mar 01, 2007    Chauncey Reading, D.O.  [address] [place], [place]  [address]   RE:  Stephanie, Horne  MRN:  [id number]  /  DOB:  Mar 05, 1941   Dear Dr. Landis Martins:   It was a pleasure seeing your nice patient, Stephanie Horne, for follow up  on Mar 01, 2007.   As you know, she is a very pleasant 70 year old obese black married  female with chronic atrial fibrillation, moderate mitral regurgitation  with ejection fraction of 55-65%.  She also has hypertension,  hyperlipidemia and lupus.  The patient has been on Coumadin for atrial  fibrillation since September of 2007.  A Holter monitor revealed atrial  fibrillation with rates varying from 58 to 108, averaging 80.  The  patient is asymptomatic.   MEDICATIONS GIVEN:  1. Fosamax.  2. Enalapril 10.  3. Prednisone 2.5.  4. Hydroxychloroquine 200 mg daily.  5. Coumadin.  6. Hydrochlorothiazide 25 daily.   PHYSICAL EXAMINATION:  VITAL SIGNS:  Blood pressure 122/79, pulse 94,  atrial fibrillation.  GENERAL APPEARANCE:  Normal.  NECK:  JVP is not elevated.  Carotid pulses palpable and equal without  bruits.  LUNGS:  Clear.  CARDIAC:  Atrial fibrillation with some increase in ventricular rate of  94.  There was a 2/6 pansystolic murmur at the apex best heard in the  left lateral decubitus position.  ABDOMEN:  Normal.  EXTREMITIES:  Normal.   IMPRESSION:  1. Chronic atrial fibrillation with somewhat increased ventricular      rate in the 90s.  2. Moderate mitral regurgitation.  3. Hypertension, controlled.  4. History of hyperlipidemia.  5. Lupus.   I suggested that the patient start on Toprol-XL 12.5 in hopes of better  controlling her rate.  I have suggested BNP and lipids and have  scheduled her to see Dr. Excell Seltzer for follow up in about 3 months.  We  plan to repeat the 2-D echocardiogram.   ADDENDUM:  The EKG reveals atrial fibrillation with ventricular rate of  94, low voltage QRS.  There is a rare PVC.   Thank you for the  opportunity to share in this nice lady's care.    Sincerely,      E. Graceann Congress, MD, [facility]  Electronically Signed    EJL/MedQ  DD: 03/01/2007  DT: 03/01/2007  Job #: [id number]

## 2011-02-18 NOTE — Discharge Summary (Signed)
[place]  Patient:    CARALEE, MOREA                      MRN: [id number] Adm. Date:  [id number] Disc. Date: [id number] Attending:  Ginnie Smart Dictator:   Maryelizabeth Rowan, M.D. CC:         Duncan Dull, M.D., primary physician   Discharge Summary  LABS VALUES OBTAINED DURING HOSPITALIZATION:  Anti-DNA is negative.  An ANA qualitative was positive.  An ANA titer was 640 with an ANA pattern that was homogenous. DD:  11/09/00 TD:  11/10/00 Job: [id number]

## 2011-02-18 NOTE — Letter (Signed)
June 21, 2006     Chauncey Reading, D.O.  [address], [place], [address]   RE:  MARKERIA, GOETSCH  MRN:  [id number]  /  DOB:  [birth year]   Dear Doctor Landis Martins:   It was a pleasure to see your nice patient, Stephanie Horne, for cardiac  evaluation on 06/21/2006.  It is noted that the patient has a history of  mitral valve prolapse and apparently was seen by me eight years ago.  I do  not have those records available at this time.   She also has hypertension, lupus and hyperlipidemia.  Recent echocardiogram  revealed moderate mitral regurgitation with mitral valve prolapse.  Left  ventricular dimensions were as follows:  End diastolic dimension 41, end  systolic 30, ejection fraction 55-65%.  There is no wall motion abnormality  noted, although this could not be excluded.  Aortic valve was normal.  Aortic root was normal in size.   The patient states that she has had no other cardiac disorders and no  history of chest pain, palpitations or shortness of breath.  She was  apparently in [facility] in [facility] with chest pain and had negative EF at  55-65%.  Mitral valve regurgitation was 2/4.  She underwent a Cardiolite  study.  I do not have a copy of that report.   PAST MEDICAL HISTORY:  Ms. Cagley has never had surgery.  She has had some  mild hyperlipidemia.   MEDICATIONS:  1. Nitro p.r.n.  2. Fosamax 70.  3. Enalapril 10.  4. Prednisone 5.  5. Hydroxychloroquine 200 daily.  6. Aspirin 81.   REVIEW OF SYSTEMS:  Head, ears, eyes, nose and throat unremarkable.  She  does wear glasses.  Cardiac history is as above.  She has had a murmur since  childhood.  GI history is negative.  GU is negative.  Musculoskeletal  history shows a history of arthritis.  She has broken the sacrum with fall  in the past.   GYNECOLOGIC HISTORY:  She has five children.   ALLERGIES:  None.   SOCIAL HISTORY:  She is retired.  She takes care of her husband and the  house.   FAMILY  HISTORY:  Father died at 87 from myocardial infarction.  Mother died  at age 82 from myocardial infarction.  One brother died at age 46 of  natural causes.  One sister died at age 53 of heart failure.   PHYSICAL EXAMINATION:  VITAL SIGNS:  Blood pressure 142/88, pulse 90.  Normal sinus rhythm.  Respirations normal.  GENERAL APPEARANCE:  Normal, moderate obesity.  NECK:  JVP is not elevated.  Carotid pulses palpable and equal without  bruits.  LUNGS:  Clear.  CARDIAC:  Atrial fibrillation.  There is a 2/6 pan systolic murmur at the  apex.  There is no click or gallop.  ABDOMEN:  Exam is unremarkable.  Liver, spleen and kidney not palpable.  EXTREMITIES:  Normal.  Distal pulses somewhat diminished.  He has trace  edema.  NEUROLOGIC:  Unremarkable.   I should note that the EKG today reveals atrial fibrillation and is  otherwise unremarkable.  The ventricular rate is well controlled.   IMPRESSIONS:  1. Mitral valve prolapse with moderate mitral regurgitation and ejection      fraction 55-65% with normal left ventricular dimensions, asymptomatic.  2. Atrial fibrillation with controlled ventricular response, asymptomatic.  3. Lupus, on therapy.  4. History of hypertension.  5. Mild hyperlipidemia.   I  do not think that she is a candidate for mitral valve repair or  replacement at this time.  She should be followed at regular intervals with  2D echo, and we will plan to repeat this in a few months.   Because of the atrial fibrillation, I will plan to add Coumadin and have her  followed by Coumadin Clinic.  I also suggested a Holter monitor to evaluate  her ventricular rate.    Additionally, we will plan CBC, BMP and INR.   Thank you for the opportunity to share in this nice patient's care.  I will  plan to see her back in three to four weeks or p.r.n.    Sincerely,      E. Graceann Congress, MD, [facility]   EJL/MedQ  DD:  06/21/2006  DT:  06/23/2006  Job #:  [id number]

## 2011-02-18 NOTE — Letter (Signed)
July 19, 2006     Chauncey Reading, MD  Internal Medicine @ [facility]  [address] [place]  [address]   RE:  Stephanie Horne, Stephanie Horne  MRN:  [id number]  /  DOB:  01/29/1941   Dear Dr. Landis Martins,   HISTORY OF PRESENT ILLNESS:  As you know she has moderate mitral  regurgitation with an EF of 55-65%, normal LV dimensions and is  asymptomatic.  She also has chronic atrial fibrillation, controlled  ventricular response.  In addition, there is a history of hypertension, mild  hyperlipidemia, and lupus.   MEDICATIONS:  Fosamax, enalapril, prednisone, Hydrocloroquine, aspirin 81  and Coumadin, which we started on her initial visit a month ago.   PHYSICAL EXAMINATION:  VITAL SIGNS:  Blood pressure:  128/84.  Pulse:  82  atrial fibrillation.  GENERAL:  Appearance normal.  JVP is not elevated.  LUNGS:  Clear  There is a 1/6 systolic murmur at the apex.   She has not had a Holter monitor as yet.  I suggested proceeding with that  so we can evaluate her ventricular rate in a 24-hour period.  Now that she is on therapeutic Coumadin we will discontinue aspirin.   I can see her back in 2 months or p.r.n..   I thank you for the opportunity to share in this nice patient's care.      Addendum.  Her initial BMP revealed a potassium of 3.4 and with some  additional dietary potassium in the form of greens and bananas her potassium  was 4.5.    Sincerely,     ______________________________  E. Graceann Congress, MD, [facility]   EJL/MedQ  /  Job #:  [id number]  DD:  07/20/2006 / DT:  07/23/2006

## 2011-02-18 NOTE — Discharge Summary (Signed)
[place]  Patient:    Stephanie Horne, Stephanie Horne                      MRN: [id number] Adm. Date:  [id number] Disc. Date: [id number] Attending:  Ginnie Smart Dictator:   Maryelizabeth Rowan, M.D. CC:         Duncan Dull, M.D. (final report echo, adenosine Cardiolite)  P. Bud Face, M.D.   Discharge Summary  DATE OF BIRTH:  January 26, 1941  PRIMARY DIAGNOSIS:  Chest pain.  SECONDARY DIAGNOSES:  Tobacco abuse and degenerative joint disease.  HOSPITAL COURSE:  This 70 year old African-American female presented with a one day history of substernal chest pain.  Description at admission was very suspicious for acute MI, as patient described a heavy pressure 10/10 intensity which was accompanied by shortness of breath, nausea, and diaphoresis.  An EKG obtained showed normal sinus rhythm.  The patient was started on an aspirin q.6h., as there was some question of pericarditis.  She received a chest CT to rule out pulmonary embolism or pericarditis.  This was negative.  Also obtained were serial enzymes for acute MI.  These serial enzymes were also negative.  The patient received an echocardiogram which revealed mitral valve regurgitation 2/4 and ejection fraction of 55-65%.  She also had moderate mitral valve prolapse, and cardiologist suggested SVE prophylaxis due to these findings.  The patient also underwent an adenosine Cardiolite study, final report pending at this time.  DISCHARGE CONDITION:  The patient was discharged home in stable condition with follow-up with her primary physician, Dr. Darrick Huntsman.  She will follow up with Dr. Darrick Huntsman in the outpatient clinic, March 5, at 3:25 p.m.  Instructions for activity include no restrictions.  Diet includes low fat, low cholesterol.  DISCHARGE MEDICATIONS:  Nitroglycerin 1 tablet sublingual q.5h. x 3 for chest pain. DD:  11/03/00 TD:  11/04/00 Job: [id number]

## 2011-02-18 NOTE — Letter (Signed)
September 12, 2006    Chauncey Reading, D.O.  [address],  [place]  [address]   RE:  Stephanie, Horne  MRN:  [id number]  /  DOB:  1940/12/06   Dear Dr. Landis Martins:   It was a pleasure to see your nice patient, Stephanie Horne, for followup  on September 12, 2006. As you know, she is a very pleasant 70 year old  obese, black, married female with chronic atrial fibrillation, moderate  mitral regurgitation with an EF of 55% to 65%.  She also has a history  of hypertension, hyperlipidemia, and lupus.   She has been on Coumadin for atrial fibrillation since September. A  Holter monitor revealed atrial fibrillation, controlled ventricular  rate, varying from 58 to 108 with an average of 80. Her medications  include Fosamax, enalapril 10, prednisone 2.5, hydro-chloroquine,  furosemide 40. Her aspirin has been discontinued.   The patient is having no cardiac symptoms at this time. Is generally  feeling quite well. She is planning to have surgery next week.   Blood pressure is 138/88, pulse is 85, atrial fibrillation with  controlled ventricular response.  GENERAL APPEARANCE: Obese. JVP is not elevated. Carotid pulse palpable  and equal without bruits.  LUNGS:  Clear.  CARDIAC: Reveals atrial fibrillation with 2/6 pansystolic murmur at the  apex in the left lateral decubitus position.  ABDOMEN: Unremarkable.  EXTREMITIES: Reveal no edema.   IMPRESSION:  1. Chronic atrial fibrillation.  2. Moderate mitral regurgitation.  3. Hypertension.  4. History of hyperlipidemia.  5. Lupus.   I have suggested that the patient could withhold the Coumadin in  anticipation of the eye surgery if her ophthalmologist suggests.   Should note her EKG today reveals atrial fibrillation with controlled  ventricular response. I will plan to see her back in 6 to 8 weeks or  p.r.n.    Sincerely,      E. Graceann Congress, MD, [facility]  Electronically Signed    EJL/MedQ  DD: 09/12/2006  DT: 09/12/2006   Job #: [id number]

## 2011-06-06 ENCOUNTER — Other Ambulatory Visit: Payer: Self-pay | Admitting: Internal Medicine

## 2011-06-22 LAB — URINE MICROSCOPIC-ADD ON

## 2011-06-22 LAB — COMPREHENSIVE METABOLIC PANEL
ALT: 14
AST: 38 — ABNORMAL HIGH
Alkaline Phosphatase: 32 — ABNORMAL LOW
BUN: 12
BUN: 14
BUN: 8
CO2: 23
CO2: 24
CO2: 27
Calcium: 7 — ABNORMAL LOW
Calcium: 8.7
Calcium: 8.9
Creatinine, Ser: 0.61
Creatinine, Ser: 0.68
GFR calc Af Amer: >60
GFR calc non Af Amer: >60
GFR calc non Af Amer: >60
GFR calc non Af Amer: >60
Glucose, Bld: 140 — ABNORMAL HIGH
Glucose, Bld: 99
Sodium: 138

## 2011-06-22 LAB — CBC
HCT: 17.1 — ABNORMAL LOW
HCT: 20.1 — ABNORMAL LOW
HCT: 26.7 — ABNORMAL LOW
HCT: 28 — ABNORMAL LOW
Hemoglobin: 10 — ABNORMAL LOW
Hemoglobin: 10.1 — ABNORMAL LOW
Hemoglobin: 6.8 — CL
Hemoglobin: 9.2 — ABNORMAL LOW
Hemoglobin: 9.7 — ABNORMAL LOW
MCHC: 33.7
MCHC: 34.1
MCHC: 34.2
MCHC: 34.3
MCHC: 34.5
MCHC: 35
MCV: 91.9
MCV: 93.8
MCV: 96
MCV: 96
MCV: 97.5
Platelets: 104 — ABNORMAL LOW
Platelets: 110 — ABNORMAL LOW
Platelets: 119 — ABNORMAL LOW
RBC: 1.76 — ABNORMAL LOW
RBC: 2.19 — ABNORMAL LOW
RBC: 2.78 — ABNORMAL LOW
RBC: 2.91 — ABNORMAL LOW
RBC: 3 — ABNORMAL LOW
RBC: 3.07 — ABNORMAL LOW
RBC: 3.11 — ABNORMAL LOW
RDW: 15.8 — ABNORMAL HIGH
RDW: 16 — ABNORMAL HIGH
RDW: 16 — ABNORMAL HIGH
WBC: 11.5 — ABNORMAL HIGH
WBC: 13.9 — ABNORMAL HIGH
WBC: 14.7 — ABNORMAL HIGH
WBC: 15.7 — ABNORMAL HIGH

## 2011-06-22 LAB — CROSSMATCH: ABO/RH(D): O POS

## 2011-06-22 LAB — PREPARE RBC (CROSSMATCH)

## 2011-06-22 LAB — ANTIPHOSPHOLIPID SYNDROME EVAL, BLD
Anticardiolipin IgA: 12 — ABNORMAL LOW (ref <13)
Antiphosphatidylserine IgA: <20 APS U/mL (ref <20.0)
Antiphosphatidylserine IgM: 66 MPS U/mL — ABNORMAL HIGH (ref <25.0)
Drvvt confirmation: 1.07 (ref <1.18)
PTT Lupus Anticoagulant: 67.1 — ABNORMAL HIGH (ref 36.3–48.8)
PTTLA Confirmation: 0 (ref <8.0)

## 2011-06-22 LAB — VITAMIN B12: Vitamin B-12: 772 (ref 211–911)

## 2011-06-22 LAB — FOLATE: Folate: 9.6

## 2011-06-22 LAB — URINALYSIS, ROUTINE W REFLEX MICROSCOPIC
Glucose, UA: NEGATIVE
pH: 5.5

## 2011-06-22 LAB — BASIC METABOLIC PANEL
CO2: 23
Calcium: 8.2 — ABNORMAL LOW
Chloride: 107
Creatinine, Ser: 0.64
GFR calc Af Amer: >60
GFR calc Af Amer: >60
GFR calc non Af Amer: >60
Glucose, Bld: 127 — ABNORMAL HIGH
Sodium: 137

## 2011-06-22 LAB — RETICULOCYTES
RBC.: 1.84 — ABNORMAL LOW
RBC.: 2.91 — ABNORMAL LOW
Retic Count, Absolute: 291 — ABNORMAL HIGH
Retic Ct Pct: 7.5 — ABNORMAL HIGH
Retic Ct Pct: 7.9 — ABNORMAL HIGH
Retic Ct Pct: 8.7 — ABNORMAL HIGH

## 2011-06-22 LAB — PROTIME-INR
INR: 1.4
INR: 2.4 — ABNORMAL HIGH
INR: >10.8
Prothrombin Time: 17.5 — ABNORMAL HIGH
Prothrombin Time: 17.6 — ABNORMAL HIGH
Prothrombin Time: 27.3 — ABNORMAL HIGH
Prothrombin Time: >90 — ABNORMAL HIGH

## 2011-06-22 LAB — FOLATE RBC: RBC Folate: 887 — ABNORMAL HIGH

## 2011-06-22 LAB — TECHNOLOGIST SMEAR REVIEW

## 2011-06-22 LAB — CK TOTAL AND CKMB (NOT AT ARMC): Relative Index: 0.4

## 2011-06-22 LAB — CARDIAC PANEL(CRET KIN+CKTOT+MB+TROPI)
CK, MB: 1.8
Total CK: 377 — ABNORMAL HIGH

## 2011-06-22 LAB — MAGNESIUM: Magnesium: 1.8

## 2011-06-22 LAB — TROPONIN I: Troponin I: 0.04

## 2011-06-22 LAB — TSH: TSH: 0.793

## 2011-06-22 LAB — APTT: aPTT: 28

## 2011-06-22 LAB — HEPATIC FUNCTION PANEL
Bilirubin, Direct: 0.4 — ABNORMAL HIGH
Indirect Bilirubin: 2.1 — ABNORMAL HIGH
Total Bilirubin: 2.5 — ABNORMAL HIGH

## 2011-06-22 LAB — LIPID PANEL
HDL: 37 — ABNORMAL LOW
Total CHOL/HDL Ratio: 4
VLDL: 19

## 2011-06-22 LAB — HAPTOGLOBIN: Haptoglobin: <6 — ABNORMAL LOW

## 2011-06-22 LAB — URINE CULTURE

## 2011-06-24 ENCOUNTER — Encounter: Payer: Self-pay | Admitting: Internal Medicine

## 2011-06-24 ENCOUNTER — Ambulatory Visit (INDEPENDENT_AMBULATORY_CARE_PROVIDER_SITE_OTHER): Payer: Self-pay | Admitting: Internal Medicine

## 2011-06-24 VITALS — BP 138/85 | HR 95 | Temp 98.4°F | Wt 183.2 lb

## 2011-06-24 DIAGNOSIS — E119 Type 2 diabetes mellitus without complications: Secondary | ICD-10-CM

## 2011-06-24 DIAGNOSIS — M545 Low back pain: Secondary | ICD-10-CM

## 2011-06-24 DIAGNOSIS — E785 Hyperlipidemia, unspecified: Secondary | ICD-10-CM

## 2011-06-24 DIAGNOSIS — I10 Essential (primary) hypertension: Secondary | ICD-10-CM

## 2011-06-24 DIAGNOSIS — Z23 Encounter for immunization: Secondary | ICD-10-CM

## 2011-06-24 DIAGNOSIS — R197 Diarrhea, unspecified: Secondary | ICD-10-CM

## 2011-06-24 DIAGNOSIS — M329 Systemic lupus erythematosus, unspecified: Secondary | ICD-10-CM

## 2011-06-24 DIAGNOSIS — E559 Vitamin D deficiency, unspecified: Secondary | ICD-10-CM

## 2011-06-24 DIAGNOSIS — Z0000 Encounter for general adult medical examination without abnormal findings: Secondary | ICD-10-CM

## 2011-06-24 DIAGNOSIS — I4891 Unspecified atrial fibrillation: Secondary | ICD-10-CM

## 2011-06-24 LAB — POCT GLYCOSYLATED HEMOGLOBIN (HGB A1C): Hemoglobin A1C: 5.8

## 2011-06-24 LAB — GLUCOSE, CAPILLARY: Glucose-Capillary: 131 mg/dL — ABNORMAL HIGH (ref 70–99)

## 2011-06-24 MED ORDER — LISINOPRIL 2.5 MG PO TABS: 2.5000 mg | ORAL_TABLET | Freq: Every day | ORAL | Status: DC

## 2011-06-24 MED ORDER — OS-CAL ULTRA 600 MG PO TABS: 1.0000 | ORAL_TABLET | Freq: Two times a day (BID) | ORAL | Status: DC

## 2011-06-24 MED ORDER — ALENDRONATE SODIUM 70 MG PO TABS: 70.0000 mg | ORAL_TABLET | ORAL | Status: DC

## 2011-06-24 MED ORDER — METOPROLOL TARTRATE 100 MG PO TABS: 100.0000 mg | ORAL_TABLET | Freq: Two times a day (BID) | ORAL | Status: DC

## 2011-06-24 MED ORDER — ONETOUCH ULTRA 2 W/DEVICE KIT: PACK | Status: DC

## 2011-06-24 MED ORDER — OMEGA-3 FATTY ACIDS 1000 MG PO CAPS: 1.0000 g | ORAL_CAPSULE | Freq: Every day | ORAL | Status: DC

## 2011-06-24 NOTE — Assessment & Plan Note (Signed)
Stable. No active issues. Patient chronically being treated with prednisone 10mg  daily indefinitely as per Dr. Patsy Lager note on 02/04/11.

## 2011-06-24 NOTE — Progress Notes (Addendum)
Subjective:   Patient ID: Stephanie Horne female   DOB: 1940/10/20 70 y.o.   MRN: [id number]  HPI: Stephanie Horne is a 70 y.o. who presents to meet new PCP.  During this visit, we addressed her DM, and she felt that her glucometer (which she forgot to bring today) is not accurate.  She notes that her symptoms do not correlate with high or low readings.  She requests a new meter, and notes that she has received meters from [facility] in the past.  She denies symptoms of hypoglycemia such as dizziness, palpitations, HA & lightheadedness. She denies symptoms of hyperglycemia such as polyuria & polydipsia.  Denies blurry vision  She does note that she lost her husband in March 2012, and believes she is adjusting the best that she can.  Her daughter expressed concerns of depression, but patient endorses partaking in activities that she enjoys such as doing puzzles, and plans to get out among people more by going to church and visiting nursing homes.  She denies suicidal ideation.  She reports her diarrhea, for which she was seen in April 2012, has not improved, even with use of imodium.  She reports that she takes 2 pills after first loose stool and then one pill after 2nd loose stool.  She notes that she has loose stools after each meal (breakfast, lunch & dinner), but cannot correlate it to any specific food intake.  She is lactose intolerant, so has no dairy intake.  She endorses urgency but not fecal or urinary incontinence.  Her last loose stool was yesterday evening.  She denies being constipated & hard stools, but does report occasional formed stools.  She denies blood in stool & steatorrhea.   Finally, she reports chronic back pain and notes that years ago she broke her tail bone and did not allow for surgery.  She says pain is sharp, constant, non radiating, 6/10 currently, 10/10 at worst and 2-3/10 at best.  Pain is worsened with movement and rain and better with heat.  Notes some relief with  tramadol, but does not like taking pills, so she requests a fentanyl patch.  She has used fentanyl patch in the past, but discontinued it because it was expensive and she was focusing on her husband's health needs at the time.  She denies tingling/numbness to lower extreme ties.  She does endorse generalized joint pain of knees and hands.  She reports compliance with all of her medications except alendronate & oscal (vit D-Ca), which are especially important given long term prednisone treatment and lactose intolerance.    Past Medical History  Diagnosis Date  . Hyperlipidemia   . Hypertension   . Back pain, chronic   . Lupus   . Seasonal allergies   . Mitral regurgitation   . Atrial fibrillation     not on coumidin because AIHA and major bleeding complications  . Axillary lymphadenopathy     left negative MMG 1/08  . AIHA (autoimmune hemolytic anemia)     12/08 onset  . Diabetes mellitus    Current Outpatient Prescriptions  Medication Sig Dispense Refill  . aspirin 81 MG EC tablet Take 81 mg by mouth daily.        . folic acid (FOLVITE) 1 MG tablet Take 1 tablet (1 mg total) by mouth daily.  90 tablet  2  . furosemide (LASIX) 40 MG tablet Take 0.5 tablets (20 mg total) by mouth daily.  15 tablet  3  . glucose blood  test strip Use as instructed  100 each  12  . lisinopril (PRINIVIL,ZESTRIL) 2.5 MG tablet Take 1 tablet (2.5 mg total) by mouth daily.  30 tablet  3  . metFORMIN (GLUCOPHAGE) 1000 MG tablet Take 1 tablet (1,000 mg total) by mouth 2 (two) times daily with a meal.  60 tablet  3  . metoprolol (LOPRESSOR) 100 MG tablet Take 1 tablet (100 mg total) by mouth 2 (two) times daily.  60 tablet  6  . omeprazole (PRILOSEC) 40 MG capsule TAKE 1 CAPSULE BY MOUTH EVERY DAY  30 capsule  3  . pravastatin (PRAVACHOL) 40 MG tablet Take 1 tablet (40 mg total) by mouth daily.  30 tablet  3  . predniSONE (DELTASONE) 10 MG tablet Take 1 tablet (10 mg total) by mouth daily.  30 tablet  3  .  traMADol (ULTRAM) 50 MG tablet Take 1 tablet (50 mg total) by mouth every 6 (six) hours as needed.  30 tablet  3  . alendronate (FOSAMAX) 70 MG tablet Take 1 tablet (70 mg total) by mouth every 7 (seven) days. Take with a full glass of water on an empty stomach.  12 tablet  3  . Blood Glucose Monitoring Suppl (ONE TOUCH ULTRA 2) W/DEVICE KIT Use to test blood sugars  1 each  0  . Calcium Carb-Vit D-C-E-Mineral (OS-CAL ULTRA) 600 MG TABS Take 1 tablet (600 mg total) by mouth 2 (two) times daily.  60 each  2  . fish oil-omega-3 fatty acids 1000 MG capsule Take 1 capsule (1 g total) by mouth daily.  30 capsule  11  . nitroGLYCERIN (NITROSTAT) 0.4 MG SL tablet Place 1 tablet (0.4 mg total) under the tongue every 5 (five) minutes as needed.  90 tablet  0   Family History  Problem Relation Age of Onset  . Heart disease Mother    History   Social History  . Marital Status: Married    Spouse Name: N/A    Number of Children: N/A  . Years of Education: N/A   Social History Main Topics  . Smoking status: Passive Smoker  . Smokeless tobacco: None  . Alcohol Use: No  . Drug Use: No  . 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 None   Social History Narrative  . None   Review of Systems:  Positives as per HPI Constitutional: Denies fever, chills, diaphoresis, appetite change and fatigue.  HEENT: Denies photophobia, eye pain, redness, hearing loss, ear pain, congestion, sore throat, rhinorrhea, sneezing, mouth sores, trouble swallowing, neck pain, neck stiffness and tinnitus.   Respiratory: Denies SOB, DOE, cough, chest tightness,  and wheezing.   Cardiovascular: Denies chest pain, palpitations and leg swelling.  Gastrointestinal: Denies nausea, vomiting, abdominal pain, constipation, blood in stool and abdominal distention.  Genitourinary: Denies dysuria,  urgency, frequency, hematuria, flank pain and difficulty urinating.  Musculoskeletal: Denies myalgias, joint swelling, and gait problem.  Skin: Denies pallor, rash and wound.  Neurological: Denies dizziness, seizures, syncope, weakness, light-headedness, numbness and headaches.  Psychiatric/Behavioral: Denies suicidal ideation, mood changes, confusion, nervousness, sleep disturbance and agitation  Objective:  Physical Exam: Filed Vitals:   06/24/11 1456  BP: 138/85  Pulse: 95  Temp: 98.4 F (36.9 C)  TempSrc: Oral  Weight: 183 lb 3.2 oz (83.099 kg)   Constitutional: Vital signs reviewed.  Patient is  a well-developed and well-nourished woman in no acute distress and cooperative with exam.  Head: Normocephalic and atraumatic Mouth: no erythema or exudates, dry mucus membranes Eyes: PERRL, EOMI, conjunctivae normal, No scleral icterus.  Neck: Supple, Trachea midline normal ROM, No JVD, mass, thyromegaly, or carotid bruit present.  Cardiovascular: RRR, S1 normal, S2 normal, no MRG, pulses symmetric and intact bilaterally Pulmonary/Chest: CTAB, no wheezes, rales, or rhonchi Abdominal: Soft. Non-tender, non-distended, bowel sounds are normal, no masses, organomegaly, or guarding present.  GU: no CVA tenderness Musculoskeletal: No joint deformities, erythema, or stiffness, ROM full and no nontender Hematology: no cervical adenopathy.  Neurological: A&O x3, Strength is normal and symmetric bilaterally, cranial nerve II-XII are grossly intact, no focal motor deficit, sensory intact to light touch bilaterally.  Skin: Warm, dry and intact. No rash, cyanosis, or clubbing.  Psychiatric: Normal mood and affect. speech and behavior is normal. Judgment and thought content normal. Cognition and memory are normal.   Assessment & Plan:  Case and care discussed with Dr. Rogelia Boga. Please see problem oriented charting for further detail. Patient to return next week for blood draw and urine collection, and  then return for 3 mo DM check.  If lab results are abnormal, I will call with results.  I discussed Ms Heron's care with Dr Milbert Coulter and agree with her assessment and plan.

## 2011-06-24 NOTE — Assessment & Plan Note (Signed)
DM well controlled on Metformin 1000 BID.  Given A1c = 5.8 and patient's age and recent diarrhea, I have asked patient to hold metformin for 2 weeks to see if symptoms improve.  I suspect she may not require metformin, and I may ask that she completely hold medication until her 3 month visit.  She understands that she should call should she have significantly high glucose readings.  LDL was within goal in 2011, will recheck. BP well controlled. Urine microalb/Cr normal in 2010, will recheck. Renal function was WNL in 2011, will recheck.  Eye exam was done in 01/2011.  She has since had right & left cataract surgery.  She refused foot exam today. Flu shot given today. Pneumovax was given in 2010 & she is up to date.  She is not a smoker (quit years ago, was a passive smoker until recently - husband passed away in 12-Jan-2011).  Patient to return next week for all lab draws & collection of urine specimen.  I informed patient that I would call her with abnormal results.

## 2011-06-24 NOTE — Assessment & Plan Note (Signed)
Repeat BP: 132/82.  At goal.  Continue current regimen of Lasix 20 daily, lisinopril 2.5 daily, metoprolol 100 BID.

## 2011-06-24 NOTE — Assessment & Plan Note (Addendum)
Diarrhea has not improved since last visit, so problem has lasted about 7 months now.  Imodium has given minimal relief.  Colonoscopy and biopsy of sessile polyp in 2008 do not suggest malignancy.  [facility] was called, and patient is on 10 year recall.  Differentials include malabsorption, hyperthyroidism, medication side effect, and colon CA.  Infectious etiology unlikely given length of symptoms, but will also check CBC with diff.  Plan: Stop metformin for 2 weeks, may consider stopping for 3 months to evaluate for change in therapy given well controlled DM and age Collect FOBT cards Check TSH, Mg, B12, Vit D, CBC with diff., CMET for electrolyte disturbances   07/04/11: May consider checking stool fat or empirically starting pancrelipase at next visit if diarrhea persists

## 2011-06-24 NOTE — Assessment & Plan Note (Addendum)
Will monitor with CBC for H/H & CMET for BR , to be drawn next week. No symptoms of anemia currently.

## 2011-06-24 NOTE — Assessment & Plan Note (Signed)
Will continue tramadol 50mg  q6h PRN pain for now.  She requested a fentanyl patch, but I explained that medication is significantly stronger and she has only been using tramadol sparingly, so I am hesitant to start a stronger medication, especially given her age.  I did encourage that she take tramadol as suggested, and not necessarily wait until pain is excruciating.  Tramadol would also be beneficial for diarrhea.  She was agreeable to plan.

## 2011-06-24 NOTE — Patient Instructions (Addendum)
-  Please stop taking metformin for 2 weeks.  Call to let us know if your diarrhea as worsened, improved, or stayed the same.  We will then call you back to let you know if you should restart your metformin.  -Please return next week for lab work - we will check your cholesterol, so please be fasting.  We will also be checking other blood levels that may give [place] an idea about why you are having diarrhea.    -We will also collect a urine sample from you when you return for blood work next week.  -Continue taking tramadol 50mg  every 6 hours as you need for pain.  I want to wait to consider the fentanyl patch switch since it is stronger than tramadol, and you haven't been taking much tramadol.    -Please bring stool cards back to the clinic for evaluation once you complete them.    -Continue taking the rest of you medications as prescribed.  -Please call the clinic at [phone] if any of your symptoms worsen or you develop new symptoms.

## 2011-06-24 NOTE — Assessment & Plan Note (Signed)
No symptoms. On betablocker.  Not on coumadin because history of BRBPR when treated with coumadin.    Continue metoprolol 200 BID

## 2011-06-24 NOTE — Assessment & Plan Note (Signed)
LDL at goal in past (2011).  TG were elevated.  Patient is compliant with pravastatin 40 & omega 3 fatty acid capsules.  Plan: continue current medication & return next week for FLP.

## 2011-06-28 ENCOUNTER — Other Ambulatory Visit: Payer: Medicare Other

## 2011-06-28 DIAGNOSIS — Z1211 Encounter for screening for malignant neoplasm of colon: Secondary | ICD-10-CM

## 2011-06-28 LAB — COMPREHENSIVE METABOLIC PANEL
ALT: 18 U/L (ref 0–35)
AST: 22 U/L (ref 0–37)
Albumin: 4.3 g/dL (ref 3.5–5.2)
BUN: 7 mg/dL (ref 6–23)
CO2: 25 mEq/L (ref 19–32)
Calcium: 9.1 mg/dL (ref 8.4–10.5)
Chloride: 109 mEq/L (ref 96–112)
Creat: 0.77 mg/dL (ref 0.50–1.10)
Potassium: 3.6 mEq/L (ref 3.5–5.3)

## 2011-06-28 LAB — CBC WITH DIFFERENTIAL/PLATELET
Basophils Absolute: 0 10*3/uL (ref 0.0–0.1)
Eosinophils Relative: 1 % (ref 0–5)
HCT: 37.2 % (ref 36.0–46.0)
Lymphocytes Relative: 26 % (ref 12–46)
Lymphs Abs: 1.3 10*3/uL (ref 0.7–4.0)
MCV: 100.5 fL — ABNORMAL HIGH (ref 78.0–100.0)
Monocytes Absolute: 0.5 10*3/uL (ref 0.1–1.0)
Neutro Abs: 3 10*3/uL (ref 1.7–7.7)
RBC: 3.7 MIL/uL — ABNORMAL LOW (ref 3.87–5.11)
RDW: 14.8 % (ref 11.5–15.5)
WBC: 4.8 10*3/uL (ref 4.0–10.5)

## 2011-06-28 LAB — MAGNESIUM: Magnesium: 1.6 mg/dL (ref 1.5–2.5)

## 2011-06-28 LAB — POC HEMOCCULT BLD/STL (HOME/3-CARD/SCREEN): Fecal Occult Blood, POC: NEGATIVE

## 2011-06-28 LAB — LIPID PANEL: LDL Cholesterol: 71 mg/dL (ref 0–99)

## 2011-06-29 LAB — DIFFERENTIAL
Basophils Absolute: 0
Basophils Relative: 0
Basophils Relative: 0
Basophils Relative: 0
Eosinophils Absolute: 0
Eosinophils Absolute: 0
Eosinophils Relative: 0
Eosinophils Relative: 0
Lymphs Abs: 0.6 — ABNORMAL LOW
Lymphs Abs: 0.6 — ABNORMAL LOW
Monocytes Absolute: 0.2
Monocytes Relative: 2 — ABNORMAL LOW
Neutro Abs: 16.9 — ABNORMAL HIGH
Neutrophils Relative %: 92 — ABNORMAL HIGH
Neutrophils Relative %: 94 — ABNORMAL HIGH

## 2011-06-29 LAB — COMPREHENSIVE METABOLIC PANEL
ALT: 19
ALT: 22
ALT: 27
AST: 27
AST: 29
Albumin: 4.1
Alkaline Phosphatase: 40
Alkaline Phosphatase: 44
Alkaline Phosphatase: 76
BUN: 22
CO2: 24
Calcium: 8.6
Chloride: 103
GFR calc Af Amer: >60
GFR calc Af Amer: >60
Glucose, Bld: 227 — ABNORMAL HIGH
Potassium: 3.5
Potassium: 3.5
Potassium: 4.6
Sodium: 138
Sodium: 139
Total Bilirubin: 1.6 — ABNORMAL HIGH
Total Protein: 6
Total Protein: 6.2

## 2011-06-29 LAB — MICROALBUMIN / CREATININE URINE RATIO
Creatinine, Urine: 272.3 mg/dL
Microalb Creat Ratio: 17.7 mg/g (ref 0.0–30.0)
Microalb, Ur: 4.82 mg/dL — ABNORMAL HIGH (ref 0.00–1.89)

## 2011-06-29 LAB — POCT I-STAT 3, ART BLOOD GAS (G3+)
Acid-Base Excess: 1
Bicarbonate: 25 — ABNORMAL HIGH
Operator id: 270091
TCO2: 26
pH, Arterial: 7.443 — ABNORMAL HIGH
pO2, Arterial: 132 — ABNORMAL HIGH

## 2011-06-29 LAB — POCT I-STAT, CHEM 8
Calcium, Ion: 1.13
Glucose, Bld: >700
HCT: 46
Hemoglobin: 15.6 — ABNORMAL HIGH
Potassium: 4.7
TCO2: 23

## 2011-06-29 LAB — URINE CULTURE

## 2011-06-29 LAB — RETICULOCYTES
RBC.: 2.5 — ABNORMAL LOW
RBC.: 2.65 — ABNORMAL LOW
RBC.: 2.78 — ABNORMAL LOW
Retic Count, Absolute: 161.2
Retic Count, Absolute: 165
Retic Count, Absolute: 214.7 — ABNORMAL HIGH
Retic Ct Pct: 5.8 — ABNORMAL HIGH
Retic Ct Pct: 6.8 — ABNORMAL HIGH

## 2011-06-29 LAB — BASIC METABOLIC PANEL
BUN: 10
BUN: 18
BUN: 19
CO2: 29
CO2: 29
CO2: 32
Calcium: 8.6
Calcium: 8.7
Calcium: 9.4
Chloride: 99
Chloride: 112
Creatinine, Ser: 0.88
Creatinine, Ser: 0.96
Creatinine, Ser: 1.04
GFR calc Af Amer: >60
Glucose, Bld: 213 — ABNORMAL HIGH
Glucose, Bld: 258 — ABNORMAL HIGH
Sodium: 147 — ABNORMAL HIGH

## 2011-06-29 LAB — CBC
HCT: 26.2 — ABNORMAL LOW
HCT: 44.6
Hemoglobin: 7.4 — CL
Hemoglobin: 9.4 — ABNORMAL LOW
Hemoglobin: 14.8
MCHC: 32.6
MCHC: 33.4
MCHC: 33.4
MCHC: 33.3
MCV: 96
MCV: 96.5
MCV: 96.6
MCV: 97.4
MCV: 98.2
Platelets: 157
Platelets: 165
Platelets: 172
RBC: 2.56 — ABNORMAL LOW
RBC: 2.67 — ABNORMAL LOW
RBC: 2.94 — ABNORMAL LOW
RBC: 2.95 — ABNORMAL LOW
RDW: 17 — ABNORMAL HIGH
RDW: 17.3 — ABNORMAL HIGH
RDW: 18 — ABNORMAL HIGH
RDW: 17.6 — ABNORMAL HIGH
WBC: 12.6 — ABNORMAL HIGH
WBC: 14.4 — ABNORMAL HIGH
WBC: 18.1 — ABNORMAL HIGH

## 2011-06-29 LAB — HEPATIC FUNCTION PANEL
ALT: 21
AST: 29
Bilirubin, Direct: 0.3
Indirect Bilirubin: 0.8
Total Protein: 5.9 — ABNORMAL LOW

## 2011-06-29 LAB — CROSSMATCH
ABO/RH(D): O POS
Antibody Screen: NEGATIVE

## 2011-06-29 LAB — C3 COMPLEMENT: C3 Complement: 53 — ABNORMAL LOW

## 2011-06-29 LAB — LACTATE DEHYDROGENASE
LDH: 547 — ABNORMAL HIGH
LDH: 748 — ABNORMAL HIGH

## 2011-06-29 LAB — URINALYSIS, ROUTINE W REFLEX MICROSCOPIC
Bilirubin Urine: NEGATIVE
Ketones, ur: 40 — AB
Nitrite: NEGATIVE
Protein, ur: NEGATIVE
Specific Gravity, Urine: 1.042 — ABNORMAL HIGH
Urobilinogen, UA: 1

## 2011-06-29 LAB — ANA: Anti Nuclear Antibody(ANA): NEGATIVE

## 2011-06-29 LAB — RAPID URINE DRUG SCREEN, HOSP PERFORMED
Amphetamines: NOT DETECTED
Benzodiazepines: NOT DETECTED
Tetrahydrocannabinol: NOT DETECTED

## 2011-06-29 LAB — CULTURE, BLOOD (ROUTINE X 2): Culture: NO GROWTH

## 2011-06-29 LAB — TECHNOLOGIST SMEAR REVIEW: Path Review: ADEQUATE

## 2011-06-29 LAB — COLD AGGLUTININ TITER: Cold Agglutinin Titer: <1:32 {titer}

## 2011-06-29 LAB — CK TOTAL AND CKMB (NOT AT ARMC)
CK, MB: 1.6
Total CK: 172

## 2011-06-29 LAB — CHROMOGENIC FACTOR X (DUKE LAB): CHROM XA: 11.4 % — ABNORMAL LOW (ref 86–146)

## 2011-06-29 LAB — PREPARE RBC (CROSSMATCH)

## 2011-06-29 LAB — PROTIME-INR
INR: 2.6 — ABNORMAL HIGH
Prothrombin Time: 28.3 — ABNORMAL HIGH

## 2011-06-29 LAB — OSMOLALITY: Osmolality: 329 — ABNORMAL HIGH

## 2011-06-29 LAB — APTT: aPTT: 30

## 2011-06-29 LAB — DIRECT ANTIGLOBULIN TEST (NOT AT ARMC): DAT, complement: POSITIVE

## 2011-06-29 LAB — OCCULT BLOOD X 1 CARD TO LAB, STOOL: Fecal Occult Bld: NEGATIVE

## 2011-06-29 LAB — SAMPLE TO BLOOD BANK

## 2011-06-30 DIAGNOSIS — E559 Vitamin D deficiency, unspecified: Secondary | ICD-10-CM | POA: Insufficient documentation

## 2011-06-30 LAB — BASIC METABOLIC PANEL
BUN: 10
BUN: 13
BUN: 13
BUN: 14
BUN: 15
BUN: 17
CO2: 17 — ABNORMAL LOW
CO2: 27
CO2: 27
CO2: 27
CO2: 28
Calcium: 8.1 — ABNORMAL LOW
Calcium: 8.6
Calcium: 8.7
Chloride: 113 — ABNORMAL HIGH
Chloride: 115 — ABNORMAL HIGH
Chloride: 115 — ABNORMAL HIGH
Chloride: 115 — ABNORMAL HIGH
Creatinine, Ser: 0.59
Creatinine, Ser: 0.75
Creatinine, Ser: 0.77
Creatinine, Ser: 0.84
GFR calc Af Amer: >60
GFR calc Af Amer: >60
GFR calc non Af Amer: >60
GFR calc non Af Amer: >60
GFR calc non Af Amer: >60
GFR calc non Af Amer: >60
GFR calc non Af Amer: >60
Glucose, Bld: 300 — ABNORMAL HIGH
Glucose, Bld: 347 — ABNORMAL HIGH
Glucose, Bld: 411 — ABNORMAL HIGH
Glucose, Bld: 451 — ABNORMAL HIGH
Glucose, Bld: 457 — ABNORMAL HIGH
Potassium: 4.2
Potassium: 4.4
Potassium: 4.8
Potassium: 5.5 — ABNORMAL HIGH
Sodium: 139
Sodium: 145

## 2011-06-30 LAB — CK TOTAL AND CKMB (NOT AT ARMC)
CK, MB: 1.9
Total CK: 262 — ABNORMAL HIGH

## 2011-06-30 LAB — DIFFERENTIAL
Basophils Absolute: 0
Basophils Relative: 0
Eosinophils Absolute: 0
Eosinophils Relative: 0
Monocytes Absolute: 0.5

## 2011-06-30 LAB — COMPREHENSIVE METABOLIC PANEL
ALT: 113 — ABNORMAL HIGH
ALT: 98 — ABNORMAL HIGH
AST: 57 — ABNORMAL HIGH
AST: 63 — ABNORMAL HIGH
Albumin: 2.7 — ABNORMAL LOW
Albumin: 2.8 — ABNORMAL LOW
Albumin: 3.3 — ABNORMAL LOW
Alkaline Phosphatase: 108
Alkaline Phosphatase: 86
Alkaline Phosphatase: 94
Alkaline Phosphatase: 98
BUN: 10
BUN: 12
BUN: 4 — ABNORMAL LOW
BUN: 5 — ABNORMAL LOW
CO2: 19
CO2: 23
CO2: 25
Calcium: 8.1 — ABNORMAL LOW
Chloride: 112
Chloride: 113 — ABNORMAL HIGH
Chloride: 114 — ABNORMAL HIGH
Creatinine, Ser: 0.6
GFR calc Af Amer: >60
GFR calc non Af Amer: >60
GFR calc non Af Amer: >60
Glucose, Bld: 264 — ABNORMAL HIGH
Glucose, Bld: 323 — ABNORMAL HIGH
Potassium: 3.8
Potassium: 3.8
Potassium: 4
Potassium: 4.9
Sodium: 143
Sodium: 144
Total Bilirubin: 0.6
Total Bilirubin: 0.7
Total Bilirubin: 1
Total Protein: 4.6 — ABNORMAL LOW
Total Protein: 4.9 — ABNORMAL LOW
Total Protein: 5.1 — ABNORMAL LOW

## 2011-06-30 LAB — DIC (DISSEMINATED INTRAVASCULAR COAGULATION)PANEL
INR: 1.1
Platelets: 152
Prothrombin Time: 14.6
Smear Review: NONE SEEN

## 2011-06-30 LAB — CBC
HCT: 32.4 — ABNORMAL LOW
HCT: 35 — ABNORMAL LOW
Hemoglobin: 11.2 — ABNORMAL LOW
Hemoglobin: 11.7 — ABNORMAL LOW
MCHC: 33.5
MCHC: 33.5
MCHC: 34.7
MCV: 97.8
MCV: 98.3
MCV: 99.3
Platelets: 76 — ABNORMAL LOW
Platelets: 92 — ABNORMAL LOW
Platelets: 93 — ABNORMAL LOW
Platelets: 99 — ABNORMAL LOW
RBC: 3.47 — ABNORMAL LOW
RDW: 17.3 — ABNORMAL HIGH
RDW: 17.7 — ABNORMAL HIGH
RDW: 18 — ABNORMAL HIGH
WBC: 11.5 — ABNORMAL HIGH

## 2011-06-30 LAB — LACTIC ACID, PLASMA: Lactic Acid, Venous: 3.6 — ABNORMAL HIGH

## 2011-06-30 LAB — FOLATE RBC: RBC Folate: 1101 — ABNORMAL HIGH

## 2011-06-30 LAB — PHOSPHORUS: Phosphorus: 2.8

## 2011-06-30 LAB — PROTIME-INR
INR: 1.2
Prothrombin Time: 15.8 — ABNORMAL HIGH

## 2011-06-30 LAB — MAGNESIUM: Magnesium: 2

## 2011-06-30 LAB — APTT: aPTT: 24

## 2011-06-30 MED ORDER — CALCIUM CARBONATE-VITAMIN D 600-400 MG-UNIT PO CHEW: 1.0000 | CHEWABLE_TABLET | Freq: Two times a day (BID) | ORAL | Status: DC

## 2011-06-30 NOTE — Progress Notes (Signed)
Addended by: Alanson Puls on: 06/30/2011 06:00 PM   Modules accepted: Orders

## 2011-06-30 NOTE — Assessment & Plan Note (Signed)
Given risk of osteoperosis, patient is taking alendronate already.  Vit D deficiency may be contributing to her low mood.  Her initial prescription cal-ultra tablet is not available at her pharmacy.  Caltrate 600/400 was sent in today (06/30/11).

## 2011-07-01 ENCOUNTER — Telehealth: Payer: Self-pay | Admitting: *Deleted

## 2011-07-01 NOTE — Telephone Encounter (Signed)
Os-Cal Ultra tablet is unavailable.  Could this be changed to something else?

## 2011-07-02 LAB — METHYLMALONIC ACID, SERUM: Methylmalonic Acid, Quantitative: 173 nmol/L (ref 87–318)

## 2011-07-05 LAB — GLUCOSE, CAPILLARY: Glucose-Capillary: 148 — ABNORMAL HIGH

## 2011-07-06 ENCOUNTER — Telehealth: Payer: Self-pay | Admitting: *Deleted

## 2011-07-08 LAB — COMPREHENSIVE METABOLIC PANEL
ALT: 19
AST: 33
Albumin: 3.8
Calcium: 8.7
Creatinine, Ser: 0.67
GFR calc Af Amer: >60
GFR calc non Af Amer: >60
Sodium: 133 — ABNORMAL LOW
Total Protein: 5.9 — ABNORMAL LOW

## 2011-07-08 LAB — URINALYSIS, ROUTINE W REFLEX MICROSCOPIC
Ketones, ur: 15 — AB
Protein, ur: 30 — AB
Urobilinogen, UA: 1

## 2011-07-08 LAB — DIFFERENTIAL
Eosinophils Absolute: 0
Eosinophils Relative: 0
Lymphocytes Relative: 7 — ABNORMAL LOW
Lymphs Abs: 1
Monocytes Relative: 3

## 2011-07-08 LAB — URINE CULTURE: Colony Count: 3000

## 2011-07-08 LAB — CBC
MCHC: 33.9
MCV: 94.2
Platelets: 153
RBC: 2.35 — ABNORMAL LOW
RDW: 15

## 2011-07-08 LAB — URINE MICROSCOPIC-ADD ON

## 2011-07-09 ENCOUNTER — Encounter: Payer: Self-pay | Admitting: Internal Medicine

## 2011-07-09 ENCOUNTER — Inpatient Hospital Stay (HOSPITAL_COMMUNITY)
Admission: EM | Admit: 2011-07-09 | Discharge: 2011-07-12 | DRG: 195 | Disposition: A | Discharge status: Home / Self Care | Payer: Medicare Other | Attending: Internal Medicine | Admitting: Internal Medicine

## 2011-07-09 ENCOUNTER — Emergency Department (HOSPITAL_COMMUNITY): Payer: Medicare Other

## 2011-07-09 DIAGNOSIS — IMO0002 Reserved for concepts with insufficient information to code with codable children: Secondary | ICD-10-CM

## 2011-07-09 DIAGNOSIS — Z79899 Other long term (current) drug therapy: Secondary | ICD-10-CM

## 2011-07-09 DIAGNOSIS — Z7982 Long term (current) use of aspirin: Secondary | ICD-10-CM

## 2011-07-09 DIAGNOSIS — M329 Systemic lupus erythematosus, unspecified: Secondary | ICD-10-CM | POA: Diagnosis present

## 2011-07-09 DIAGNOSIS — R0902 Hypoxemia: Secondary | ICD-10-CM | POA: Diagnosis present

## 2011-07-09 DIAGNOSIS — I4891 Unspecified atrial fibrillation: Secondary | ICD-10-CM | POA: Diagnosis present

## 2011-07-09 DIAGNOSIS — Z87891 Personal history of nicotine dependence: Secondary | ICD-10-CM

## 2011-07-09 DIAGNOSIS — E119 Type 2 diabetes mellitus without complications: Secondary | ICD-10-CM | POA: Diagnosis present

## 2011-07-09 DIAGNOSIS — T502X5A Adverse effect of carbonic-anhydrase inhibitors, benzothiadiazides and other diuretics, initial encounter: Secondary | ICD-10-CM | POA: Diagnosis not present

## 2011-07-09 DIAGNOSIS — I059 Rheumatic mitral valve disease, unspecified: Secondary | ICD-10-CM | POA: Diagnosis present

## 2011-07-09 DIAGNOSIS — I10 Essential (primary) hypertension: Secondary | ICD-10-CM | POA: Diagnosis present

## 2011-07-09 DIAGNOSIS — E876 Hypokalemia: Secondary | ICD-10-CM | POA: Diagnosis not present

## 2011-07-09 DIAGNOSIS — E785 Hyperlipidemia, unspecified: Secondary | ICD-10-CM | POA: Diagnosis present

## 2011-07-09 DIAGNOSIS — J189 Pneumonia, unspecified organism: Principal | ICD-10-CM | POA: Diagnosis present

## 2011-07-09 LAB — COMPREHENSIVE METABOLIC PANEL
ALT: 34 U/L (ref 0–35)
AST: 50 U/L — ABNORMAL HIGH (ref 0–37)
Albumin: 4 g/dL (ref 3.5–5.2)
Alkaline Phosphatase: 59 U/L (ref 39–117)
CO2: 21 mEq/L (ref 19–32)
Chloride: 107 mEq/L (ref 96–112)
GFR calc non Af Amer: >90 mL/min (ref >90)
Potassium: 4.5 mEq/L (ref 3.5–5.1)
Total Bilirubin: 0.8 mg/dL (ref 0.3–1.2)

## 2011-07-09 LAB — URINALYSIS, ROUTINE W REFLEX MICROSCOPIC
Bilirubin Urine: NEGATIVE
Glucose, UA: NEGATIVE mg/dL
Specific Gravity, Urine: 1.027 (ref 1.005–1.030)
Urobilinogen, UA: 0.2 mg/dL (ref 0.0–1.0)
pH: 5.5 (ref 5.0–8.0)

## 2011-07-09 LAB — CBC
HCT: 41 % (ref 36.0–46.0)
Hemoglobin: 13.2 g/dL (ref 12.0–15.0)
MCHC: 32.2 g/dL (ref 30.0–36.0)
MCV: 97.9 fL (ref 78.0–100.0)
WBC: 10.5 10*3/uL (ref 4.0–10.5)

## 2011-07-09 LAB — EXPECTORATED SPUTUM ASSESSMENT W GRAM STAIN, RFLX TO RESP C

## 2011-07-09 LAB — DIFFERENTIAL
Basophils Absolute: 0 10*3/uL (ref 0.0–0.1)
Eosinophils Relative: 0 % (ref 0–5)
Lymphocytes Relative: 7 % — ABNORMAL LOW (ref 12–46)
Lymphs Abs: 0.7 10*3/uL (ref 0.7–4.0)
Monocytes Absolute: 0.3 10*3/uL (ref 0.1–1.0)
Neutro Abs: 9.5 10*3/uL — ABNORMAL HIGH (ref 1.7–7.7)

## 2011-07-09 LAB — MRSA PCR SCREENING: MRSA by PCR: NEGATIVE

## 2011-07-09 LAB — BLOOD GAS, ARTERIAL
Acid-base deficit: 1.5 mmol/L (ref 0.0–2.0)
Bicarbonate: 22.4 mEq/L (ref 20.0–24.0)
Drawn by: 33234
Inspiratory PAP: 12
pCO2 arterial: 35.9 mmHg (ref 35.0–45.0)
pO2, Arterial: 91.3 mmHg (ref 80.0–100.0)

## 2011-07-09 LAB — URINE MICROSCOPIC-ADD ON

## 2011-07-09 LAB — POCT I-STAT TROPONIN I: Troponin i, poc: 0 ng/mL (ref 0.00–0.08)

## 2011-07-09 LAB — CK TOTAL AND CKMB (NOT AT ARMC)
CK, MB: 2 ng/mL (ref 0.3–4.0)
Relative Index: INVALID (ref 0.0–2.5)

## 2011-07-09 LAB — GLUCOSE, CAPILLARY: Glucose-Capillary: 135 mg/dL — ABNORMAL HIGH (ref 70–99)

## 2011-07-09 NOTE — H&P (Signed)
Hospital Admission Note Date: 07/09/2011  Patient name:  Stephanie Horne   Medical record number:  [id number] Date of birth:  07/27/1941    Age: 70 y.o. Gender:  female PCP:    Vernice Jefferson, MD, MD  Medical Service:   Internal Medicine Teaching Service   Attending physician:  Dr. Cliffton Asters First Contact:   Maxie Better (MS-IV) Pager: [phone]  Second Contact:   Dr. Saralyn Pilar Pager: [phone] After Hours:   First Contact   Pager: [phone]      Second Contact   Pager: [phone]   Chief Complaint: Cough and Shortness of Breath   History of Present Illness: Patient is a 70 y.o. female with a PMHx of Systemic Lupus Erythematous and atrial fibrillation not on Coumadin therapy (secondary to autoimmune hemolytic anemia), who presents to [facility] accompanied by her daughter for evaluation of cough and shortness of breath. Patient and daughter indicate that she was of her usual state of health until the morning of admission at 1:30am, when she awoke with sudden onset of cough and shortness of breath. The cough is productive of pink-colored sputum.  She complains of associated chills, but no documented fever at home. She has chest pain that occurs with the cough, but that is not apparent at rest. Patient has no known history of congestive heart failure, and denies recent changes in weight. Confirms recent sick contact, including her niece.    Of note, the patient's Lupus was diagnosed several years ago and has been well controlled with daily Prednisone therapy and has not required DMARDS.  She does not recall any recent flare ups and denies any prior history of pulmonary complications due to Lupus. She further denies recent prolonged period of immobility, recent long distance travel.     Current Outpatient Medications: Medication Sig  . alendronate (FOSAMAX) 70 MG tablet Take 1 tablet (70 mg total) by mouth every 7 (seven) days. Take with a full glass of water on an empty stomach.  Marland Kitchen aspirin 81 MG  EC tablet Take 81 mg by mouth daily.    . Blood Glucose Monitoring Suppl (ONE TOUCH ULTRA 2) W/DEVICE KIT Use to test blood sugars  . Calcium Carbonate-Vitamin D (CALTRATE 600+D) 600-400 MG-UNIT per chew tablet Chew 1 tablet by mouth 2 (two) times daily.  . fish oil-omega-3 fatty acids 1000 MG capsule Take 1 capsule (1 g total) by mouth daily.  . folic acid (FOLVITE) 1 MG tablet Take 1 tablet (1 mg total) by mouth daily.  . furosemide (LASIX) 40 MG tablet Take 0.5 tablets (20 mg total) by mouth daily.  Marland Kitchen glucose blood test strip Use as instructed  . lisinopril (PRINIVIL,ZESTRIL) 2.5 MG tablet Take 1 tablet (2.5 mg total) by mouth daily.  . metFORMIN (GLUCOPHAGE) 1000 MG tablet Take 1 tablet (1,000 mg total) by mouth 2 (two) times daily with a meal.  . metoprolol (LOPRESSOR) 100 MG tablet Take 1 tablet (100 mg total) by mouth 2 (two) times daily.  . nitroGLYCERIN (NITROSTAT) 0.4 MG SL tablet Place 1 tablet (0.4 mg total) under the tongue every 5 (five) minutes as needed.  Marland Kitchen omeprazole (PRILOSEC) 40 MG capsule TAKE 1 CAPSULE BY MOUTH EVERY DAY  . pravastatin (PRAVACHOL) 40 MG tablet Take 1 tablet (40 mg total) by mouth daily.  . predniSONE (DELTASONE) 10 MG tablet Take 1 tablet (10 mg total) by mouth daily.  . traMADol (ULTRAM) 50 MG tablet Take 1 tablet (50 mg total) by mouth every 6 (six) hours as  needed.    Allergies: No Known Drug Allergies   Past Medical History: Diagnosis Date  . Hyperlipidemia: well controlled. LDL 71 in Sept 2012.    Marland Kitchen Hypertension, controlled with medical therapy.    . Systemic Lupus Erythematous.  On Prednisone daily. Not requiring DMARDS.   Marland Kitchen Atrial fibrillation     not on coumidin because AIHA, major bleeding complications, and recurrent supratherapeutic INR (> 7).  . Axillary lymphadenopathy     left negative MMG 1/08  . AIHA (autoimmune hemolytic anemia)     12/08 onset. Baseline Hg 12-14  . Diabetes mellitus: well controlled. Hgb A1c 5.8% in Sept 2012.   Not on oral hypoglycemic due to well control and  adverse reaction to Metformin.       Past Surgical History: Procedure Date  . Eye surgery     cataract in both eyes    Family History: Problem Relation Age of Onset  . Heart disease Mother     Social History: History   Social History  . Marital Status: Widowed    Social History Main Topics  . Smoking status: Passive Smoker  . Smokeless tobacco: No  . Alcohol Use: None  . Drug Use: None  . Sexually Active: Not on file     married, retired Engineer, materials, no smoking, alochol, illegal drug, her husband was clinic patient and died of lung cancer      Review of Systems: Constitutional:  Positive for chills and diaphoresis and fatigue. Denies any recent weight change.   HEENT: Denies photophobia, eye pain, redness, hearing loss, ear pain, congestion, sore throat, rhinorrhea,   Respiratory: Positive for SOB, DOE, productive cough, chest tightness, and wheezing as described in HPI.   Cardiovascular: Positive for chest pain with cough as per HPI.   Gastrointestinal: Denies nausea, vomiting, abdominal pain.   Genitourinary: Denies dysuria, urgency, frequency, hematuria, flank pain and difficulty urinating.  Musculoskeletal: Denies myalgias, back pain, joint swelling, arthralgias and gait problem.   Skin: Denies pallor, rash and wound.  Neurological: Denies dizziness, seizures, syncope, weakness, light-headedness, numbness and headaches.   Hematological: Denies adenopathy.  Psychiatric/ Behavioral: Denies suicidal ideation, mood changes, confusion, nervousness, sleep disturbance and agitation.    Vital Signs: T: 97.6 P: 103 BP: 150/71 RR: 103 O2 sat: 82% on O2 face mask   Physical Exam: General: Vital signs reviewed and noted. Well-developed, well-nourished, in moderate acute distress;  alert, appropriate and cooperative throughout examination.  Head: Normocephalic, atraumatic.  Eyes: PERRL, EOMI, No signs of anemia or  jaundince.  Nose: Mucous membranes moist, not inflammed, nonerythematous.  Throat: Oropharynx nonerythematous, no exudate appreciated.   Neck: No deformities, masses, or tenderness noted.Supple, No carotid Bruits, no JVD.  Lungs:  Labored respiratory effort. Diffuse expiratory wheezes.   Diffuse rhonchi.   Heart: Irregularly irregular. S1 and S2 normal without gallop, murmur, or rubs.  Abdomen:  BS normoactive. Soft, Nondistended, non-tender.  No masses or organomegaly.  Extremities: No pretibial edema.  Neurologic: A&O X3, CN II - XII are grossly intact.  Skin: No visible rashes, scars.   Lab results: Basic Metabolic Panel: Recent Labs  [facility] 07/09/11 1035   NA 142   K 4.5   CL 107   CO2 21   GLUCOSE 153*   BUN 11   CREATININE 0.50   CALCIUM 8.8   MG --   PHOS --   Liver Function Tests: Recent Labs  [facility] 07/09/11 1035   AST 50*   ALT 34  ALKPHOS 59   BILITOT 0.8   PROT 7.0   ALBUMIN 4.0   CBC: Recent Labs  [facility] 07/09/11 1035   WBC 10.5   NEUTROABS 9.5*   HGB 13.2   HCT 41.0   MCV 97.9   PLT 161   Cardiac Enzymes: Recent Labs  [facility] 07/09/11 1050   CKTOTAL 58   CKMB 2.0   CKMBINDEX --   TROPONINI --   BNP: Recent Labs  [facility] 07/09/11 1123   POCBNP 2148.0*    ABG:   On Bipap (inspir PAP 12, expir PAP 6) PH: 7.41 PCO2:35.9 P02:91.3 HCO3: 22.4   Imaging results:  Dg Chest 2 View -IMPRESSION: Severe bilateral pneumonia.  Consider sequelae of aspiration.     Other results: WUJ:WJXBJY fibrillation with rapid ventricular response.     Assessment & Plan: 70 year old female with past medical history of SLE that presents with productive cough and shortness of breath, most likely due to infectious vs autoimmune process.   1) Productive cough and shortness of breath, acute onset: Patient presents with an acute onset of productive cough and shortness of breath with chest xray showing bilateral infiltrates concerning for community  acquired pneumonia, aspiration pneumonia, or possibly a pulmonary complication of known SLE. Given that she is on chronic steroid therapy, opportunistic infection in an immunocompromised host must also be considered, particularly given desaturation and diffuse pulmonary infiltrates. Patient does not have history of CHF (with last echo in 2009 showing moderate MVP and MR, LV EF 60%), and no evidence of volume overload on exam. Lastly, although her atrial fibrillation could potentially contribute to symptoms of dyspnea, her heart rates have been well controlled on beta blocker therapy (80-100 bpm), therefore, this is unlikely a contributing factor.   - Will start Rocephin and Azithromycin to cover for CAP in hospitalized patient, consider broadening coverage if not improving.   - RT to place bipap - secondary to patient desaturating on face mask. - Check ABG. - Patient made NPO secondary to possibility of aspiration as a cause of her lung infiltrates.   - Solumedrol 60mg  IV q8hr for SLE and potential inflammatory process.  - Check sputum gram stain and culture.  - Check Urine Legionella antigen, urine strep antigen and influenza by PCR. - Consider check LDH and stain for PCP if patient's status declines.   2) Systemic lupus erythematous: Patient was diagnosed with SLE many years ago Solumedrol 60mg  IV q8hr.    3) Atrial fibrillation, chronic:  Patient presented with atrial fibrillation with rapid ventricular response.  Her heart rate is now controlled in the 80s on beta blocker therapy.  - Will continue IV beta blockers for now, will then transition to home regimen once respiratory distress has stabilized. - Continue off of anticoagulation due to history of autoimmune hemolytic anemia, bilateral arm hematomas (10/2007), and coumadin toxicity (INR up to 7).   4.  Diabetes Mellitus: well controlled. Was actually recently discontinued of Metformin secondary to HgA1c of 5.8.  - Will monitor glucose  levels. - Place on insulin sliding scale, with expectation of hyperglycemia while on steroid therapy.   5. Autoimmune Hemolytic Anemia: stable.  Hemoglobin on admission 13.2.  Baseline Hg 12-14.   6. DVT PPX: SCDs.     Maxie Better (MS IV)     ____________________________________    Date/ Time:      ____________________________________     Johnette Abraham, D.O. (PGY2):  ____________________________________    Date/ Time:  ____________________________________      I have seen and examined the patient. I reviewed the resident/fellow note and agree with the findings and plan of care as documented. My additions and revisions are included.   Signature:  ____________________________________________     Internal Medicine Teaching Service Attending    Date:   ____________________________________________

## 2011-07-10 DIAGNOSIS — J189 Pneumonia, unspecified organism: Secondary | ICD-10-CM

## 2011-07-10 LAB — URINE CULTURE
Colony Count: >=100000
Culture  Setup Time: 201210061820

## 2011-07-10 LAB — LEGIONELLA ANTIGEN, URINE

## 2011-07-10 LAB — INFLUENZA PANEL BY PCR (TYPE A & B): Influenza A By PCR: NEGATIVE

## 2011-07-10 LAB — GLUCOSE, CAPILLARY

## 2011-07-11 ENCOUNTER — Inpatient Hospital Stay (HOSPITAL_COMMUNITY): Payer: Medicare Other

## 2011-07-11 DIAGNOSIS — J189 Pneumonia, unspecified organism: Secondary | ICD-10-CM

## 2011-07-11 DIAGNOSIS — I369 Nonrheumatic tricuspid valve disorder, unspecified: Secondary | ICD-10-CM

## 2011-07-11 LAB — GLUCOSE, CAPILLARY
Glucose-Capillary: 245 mg/dL — ABNORMAL HIGH (ref 70–99)
Glucose-Capillary: 224 mg/dL — ABNORMAL HIGH (ref 70–99)
Glucose-Capillary: 460 mg/dL — ABNORMAL HIGH (ref 70–99)

## 2011-07-11 LAB — BASIC METABOLIC PANEL
Calcium: 9.5 mg/dL (ref 8.4–10.5)
Creatinine, Ser: 0.73 mg/dL (ref 0.50–1.10)
GFR calc non Af Amer: 83 mL/min — ABNORMAL LOW (ref >90)
Glucose, Bld: 408 mg/dL — ABNORMAL HIGH (ref 70–99)
Sodium: 136 mEq/L (ref 135–145)

## 2011-07-12 LAB — BASIC METABOLIC PANEL
CO2: 26 mEq/L (ref 19–32)
Chloride: 102 mEq/L (ref 96–112)
Creatinine, Ser: 0.62 mg/dL (ref 0.50–1.10)
GFR calc Af Amer: >90 mL/min (ref >90)
Potassium: 3.4 mEq/L — ABNORMAL LOW (ref 3.5–5.1)

## 2011-07-12 LAB — GLUCOSE, CAPILLARY
Glucose-Capillary: 180 mg/dL — ABNORMAL HIGH (ref 70–99)
Glucose-Capillary: 168 mg/dL — ABNORMAL HIGH (ref 70–99)

## 2011-07-12 LAB — CLOSTRIDIUM DIFFICILE BY PCR: Toxigenic C. Difficile by PCR: NEGATIVE

## 2011-07-12 LAB — CULTURE, RESPIRATORY W GRAM STAIN: Culture: NORMAL

## 2011-07-15 ENCOUNTER — Encounter: Payer: Self-pay | Admitting: Internal Medicine

## 2011-07-15 ENCOUNTER — Ambulatory Visit: Payer: Medicare Other | Admitting: Internal Medicine

## 2011-07-15 ENCOUNTER — Ambulatory Visit (INDEPENDENT_AMBULATORY_CARE_PROVIDER_SITE_OTHER): Payer: Medicare Other | Admitting: Internal Medicine

## 2011-07-15 DIAGNOSIS — M329 Systemic lupus erythematosus, unspecified: Secondary | ICD-10-CM

## 2011-07-15 DIAGNOSIS — I4891 Unspecified atrial fibrillation: Secondary | ICD-10-CM

## 2011-07-15 DIAGNOSIS — R197 Diarrhea, unspecified: Secondary | ICD-10-CM

## 2011-07-15 DIAGNOSIS — I10 Essential (primary) hypertension: Secondary | ICD-10-CM

## 2011-07-15 DIAGNOSIS — E119 Type 2 diabetes mellitus without complications: Secondary | ICD-10-CM

## 2011-07-15 LAB — BASIC METABOLIC PANEL WITH GFR
CO2: 26 mEq/L (ref 19–32)
Calcium: 9.5 mg/dL (ref 8.4–10.5)
Creat: 0.87 mg/dL (ref 0.50–1.10)
GFR, Est African American: >60 mL/min (ref >60)
GFR, Est Non African American: >60 mL/min (ref >60)
Potassium: 4 mEq/L (ref 3.5–5.3)
Sodium: 140 mEq/L (ref 135–145)

## 2011-07-15 LAB — CULTURE, BLOOD (ROUTINE X 2): Culture  Setup Time: 201210061821

## 2011-07-15 LAB — GLUCOSE, CAPILLARY: Glucose-Capillary: 249 mg/dL — ABNORMAL HIGH (ref 70–99)

## 2011-07-15 MED ORDER — GLIPIZIDE 5 MG PO TABS: 5.0000 mg | ORAL_TABLET | Freq: Every day | ORAL | Status: DC

## 2011-07-15 NOTE — Progress Notes (Signed)
  Subjective:    Patient ID: Stephanie Horne, female    DOB: [birth year], 70 y.o.   MRN: [id number]  HPI Stephanie Horne is a pleasant 70 year old woman with past with history of HTN, DM, hyper lipidemia, A. fib, SLE who was recently admitted to the hospital on 07/09/2011 to 07/12/2011 for community acquired pneumonia versus SLE flareup, comes for followup after hospital discharge. Am not able to find discharge summary of this discharge. So don't know what specific thing the admitting team wanted me to do in this office visit. Reviewed her admission notes and results and chest x-rays . She was sent home on 3 days worth of antibiotics to complete one week of therapy which she did. She was sent home on potassium chloride 10 mg tablets daily until today. I will be check BMP and make appropriate changes as needed. She denies any chest pain cough, shortness of breath, abdominal pain, headache, diarrhea, fever, chills. She is worried about her sugar staying high in low 200s. She is a diabetic and recently taken off metformin due to diarrhea and HbA1c of 5.8. Her CBG today is 250s. Although she denies any increase thirst or urination. She does not bring the meter today.    Review of Systems    as per history of present illness, all other systems reviewed and negative. Objective:   Physical Exam Vitals: Reviewed and stable General: NAD HEENT: PERRL, EOMI, no scleral icterus Cardiac: S1,S2, irregularly irregular, no rubs, murmurs or gallops Pulm: clear to auscultation bilaterally, moving normal volumes of air Abd: soft, nontender, nondistended, BS present Ext: warm and well perfused, no pedal edema Neuro: alert and oriented X3, cranial nerves II-XII grossly intact, strength and sensation to light touch equal in bilateral upper and lower extremities        Assessment & Plan:

## 2011-07-15 NOTE — Assessment & Plan Note (Signed)
Patient CBGs running high. Recently stopped metformin. She still has some diarrhea. I would start glipizide 5 mg daily and escalate as needed. Explained her to call the clinic after a week if her sugars are high or low so that he can make appropriate changes.

## 2011-07-15 NOTE — Assessment & Plan Note (Addendum)
Rate controlled and symptomatic.

## 2011-07-15 NOTE — Assessment & Plan Note (Signed)
Better than before. Now 2-3 times a day-well-formed stool, no more watery. Has extensive evaluation in the past negative for any cause.

## 2011-07-15 NOTE — Assessment & Plan Note (Signed)
Recent hospital admission for community acquired pneumonia versus lupus flare. Completed antibiotic course and has no more chest pain or dyspnea.

## 2011-07-15 NOTE — Assessment & Plan Note (Addendum)
Blood pressure at goal. No change in medication today. I will check BMP as her discharge potassium was 3.4. I will call her if potassium is still low.

## 2011-07-15 NOTE — Patient Instructions (Signed)
Please make followup appointment in 8-10 weeks. Start taking glipizide 5 mg once daily before breakfast for your diabetes. Keep checking your sugar regularly and give [place] a call if your glucose stays high after one week of using glipizide. We might need to double the dose in that case. Take all your medications regularly. I will give you a call if the lab test shows any abnormalities in any medication changes needed. If I don't call then if everything is okay.

## 2011-07-17 NOTE — Discharge Summary (Signed)
HOSPITAL DISCHARGE SUMMARY  DATE OF ADMISSION:       07/09/2011  DATE OF DISCHARGE:       07/12/2011   BRIEF HOSPITAL SUMMARY: Pt is a 70 y.o. female  With a PMHX of SLE on chronic prednisone therapy and atrial fibrillation, not on Coumadin therapy secondary to history of BL arm hematomas and supratherapeutic coumadin levels, who presented who presented to [facility] for evaluation of SOB that began on the morning of admission. CXR on admission as concerning for severe BL CAP and may have been also contributed by pulmonary complication of her SLE. She initially required BiPAP for oxygenation, which was then transitioned to nasal cannula with eventual return to breathing on room air without desaturation. On admission she was also started on IV Rocephin, Azithromycin, and stress dose steroids on admission. These medications were transitioned to oral Levaquin with a prednisone taper. The patient responded remarkably well to medical therapy. 2D echo was ordered to evaluation for potential CHF, however evidenced normal LV systolic function and indeterminate diastolic dysfunction.   STUDIES PENDING AT TIME OF DISCHARGE: None.  OTHER FOLLOW-UP ISSUES: 1) BMET - please check BMET to eval her electrolytes as lasix was increased to 40mg  daily from 20 mg daily. Also you can address if she should continue the KCl that she was dc'ed home on.  2) CXR - please recheck CXR in 4-6 weeks to assess resolution of PNA.    MEDICATIONS ON DISCHARGE: benzonatate (TESSALON) 100 MG capsule Take 100 mg by mouth 3 (three) times daily as needed.     potassium chloride (KLOR-CON) 10 MEQ CR tablet Take 10 mEq by mouth daily.     alendronate (FOSAMAX) 70 MG tablet Take 1 tablet (70 mg total) by mouth every 7 (seven) days. Take with a full glass of water on an empty stomach.   aspirin 81 MG EC tablet Take 81 mg by mouth daily.     Blood Glucose Monitoring Suppl (ONE TOUCH ULTRA 2) W/DEVICE KIT Use to test blood sugars   Calcium Carbonate-Vitamin D (CALTRATE 600+D) 600-400 MG-UNIT per chew tablet Chew 1 tablet by mouth 2 (two) times daily.   fish oil-omega-3 fatty acids 1000 MG capsule Take 1 capsule (1 g total) by mouth daily.   folic acid (FOLVITE) 1 MG tablet Take 1 tablet (1 mg total) by mouth daily.   furosemide (LASIX) 40 MG tablet Take 40 mg by mouth daily.     glucose blood test strip Use as instructed   lisinopril (PRINIVIL,ZESTRIL) 2.5 MG tablet Take 1 tablet (2.5 mg total) by mouth daily.   metoprolol (LOPRESSOR) 100 MG tablet Take 1 tablet (100 mg total) by mouth 2 (two) times daily.   nitroGLYCERIN (NITROSTAT) 0.4 MG SL tablet Place 1 tablet (0.4 mg total) under the tongue every 5 (five) minutes as needed.   omeprazole (PRILOSEC) 40 MG capsule TAKE 1 CAPSULE BY MOUTH EVERY DAY   pravastatin (PRAVACHOL) 40 MG tablet Take 1 tablet (40 mg total) by mouth daily.   predniSONE (DELTASONE) 10 MG tablet Take 1 tablet (10 mg total) by mouth daily. Prednisone will be tapered back to 10mg  daily by time of clinic follow-up.    traMADol (ULTRAM) 50 MG tablet Take 1 tablet (50 mg total) by mouth every 6 (six) hours as needed.

## 2011-07-19 ENCOUNTER — Encounter: Payer: Medicare Other | Admitting: Internal Medicine

## 2011-08-01 NOTE — Discharge Summary (Signed)
Stephanie, Horne NO.:  [id number]  MEDICAL RECORD NO.:  [id number]  LOCATION:  3009                         FACILITY:  [facility]  PHYSICIAN:  Cliffton Asters, M.D.    DATE OF BIRTH:  May 10, 1941  DATE OF ADMISSION:  07/09/2011 DATE OF DISCHARGE:  07/12/2011                              DISCHARGE SUMMARY   DISCHARGED DIAGNOSES: 1. Acute respiratory distress - likely secondary to community-acquired     pneumonia, possible pulmonary complication from known systemic     lupus erythematosus. 2. Systemic lupus erythematosus - on chronic prednisone therapy. 3. Hypertension. 4. Chronic atrial fibrillation - rate controlled on home beta-blocker.     Not on Coumadin therapy secondary to history of bilateral arm     hematomas in January 2009 and prior Coumadin toxicity with INR     substitutes 7. 5. Communicty acquired pneumonia. 6. Diabetes mellitus type 2, hemoglobin A1c 5.8 - not on home medications. 7. Hypokalemia, likely secondary to increased Lasix dosage - repleted.  DISCHARGE MEDICATIONS: 1. Levofloxacin 750 mg by mouth once daily for 3 additional days. 2. Potassium chloride 10 mEq by mouth once daily until follow up on     July 15, 2011, then as directed. 3. Prednisone 10 mg tabs take 6 tablets for 2 days, then 4 tablets for     2 days, then 2 tablets for 2 days, then continue on regular 10 mg     dosage. 4. Tessalon Perles 100 mg tab 3 times by mouth daily. 5. Furosemide 40 mg by mouth daily. 6. Alendronate 70 mg by mouth once weekly. 7. Aspirin 81 mg by mouth once daily. 8. Calcium vitamin D 600-400 by mouth twice daily. 9. Folic acid 1 mg by mouth daily. 10.Lisinopril 2.5 mg by mouth daily. 11.Metoprolol 100 mg by mouth twice daily. 12.Nitroglycerin sublingual 0.4 mg 1 tablet under tongue every 5     minutes as needed up to 3 doses for chest pain. 13.Omega-3 fatty acid 1 capsule by mouth daily. 14.Omeprazole 40 mg by mouth daily. 15.Pravastatin 40  mg by mouth daily. 16.Tramadol 50 mg by mouth every 6 hours as needed for pain.  DISPOSITION AND FOLLOWUP:  The patient is to follow up in the [facility] on Friday October 12th at 1:15 p.m. at which time, a BMET should be checked to evaluate the patient's electrolytes as she was recently increase of her home Lasix dosage from 20 mg daily to 40 mg daily at discharge.  The patient's 2D echocardiogram results should reviewed, which were pending at the time of discharge.  The patient should also receive a follow up chest x-ray in 4-6 weeks for further to ensure resolution of the community-acquired pneumonia.  PROCEDURES PERFORMED: 1. Chest x-ray - July 09, 2011 - severe bilateral pneumonia.     Consider sequelae of aspiration. 2. Chest x-ray - July 31, 2011 - patchy bilateral airspace disease,     with slight improvement from July 09, 2011.  Time course favors     edema.  Pneumonia is not excluded.  Small right pleural effusion. 3. 2D echocardiogram - July 11, 2011 - normal left ventricular     systolic  function with an EF of 55-60%.  Normal wall motion without     regional wall motion abnormalities.  Indeterminate diastolic     function.  Mild left ventricular hypertrophy.  Mitral valve has     flattened closure with moderate, posteriorly directed     regurgitation.  Mild pulmonary hypertension with PA peak pressure     of 45 mmHg.  CONSULTATIONS:  None.  ADMISSION HISTORY AND PHYSICAL:  The patient is a 70 year old female with past medical history of SLE and atrial fibrillation, not on Coumadin therapy secondary to history of autoimmune hemolytic anemia, who presented to [facility] accompanied by her daughter for evaluation of cough and shortness of breath.  The patient and daughter indicates that she was of her usual state of health until the morning of admission at 1:30 am when she awoke with sudden onset of cough and shortness of breath.  The  cough is productive of pink-colored sputum.  She complains of associated chills, but no documented fevers at home.  She has chest pain that occurs with a cough and that is not apparent at rest.  She has no known history of congestive heart failure, denies recent changes in weight.  Confirmed recent sick contacts, including her niece.  Of note, the patient's lupus was diagnosed several years ago and has been well controlled on daily prednisone therapy and has not required DMARDS.  She does not recall any recent flare ups and denies any prior history of pulmonary complications due to lupus.  She further denies recent prolonged periods of immobility, recent long distance travel.  PHYSICAL EXAM ON ADMISSION:  VITAL SIGNS:  Temperature 97.6, pulse 103, blood pressure 150/71, respirations 103, O2 saturation 82% on O2 face mask. GENERAL:  Well developed, well nourished, in moderate acute distress secondary to respiratory distress. HEENT:  Within normal limits. NECK:  No carotid bruits.  No JVD.  No lymphadenopathy. LUNGS:  Labored respiratory effort.  Diffuse expiratory wheezing with diffuse rhonchi. HEART:  Irregularly irregular.  No murmurs, rubs, or gallops. ABDOMEN:  Bowel sounds normoactive, soft, nontender, nondistended. EXTREMITIES:  No pretibial edema. NEUROLOGIC:  Alert and oriented x3.  Cranial nerves II through XII grossly intact.  LABS ON ADMISSION:  BMET:  Sodium 142, potassium 4.5, chloride 107, bicarb 21, glucose 153, BUN 11, creatinine 0.5, calcium 8.8.  LFTs:  AST 50, ALT 34, alk phos 59, total bilirubin 0.8, protein 7, albumin 4.  CBC:  WBC 10.5, hemoglobin 13.2, hematocrit 41, platelets 161.  Cardiac enzymes:  CK total 58, CK-MB 2.  Pro BNP 2148.  ABG on BiPAP, pH 7.41, PCO2 of 35.9, PO2 of 91.3, bicarb 22.4.  HOSPITAL COURSE BY PROBLEMS: 1. Acute respiratory distress - likely secondary to community-acquired     pneumonia, although pulmonary complication from the  patient's known     SLE cannot be excluded.  On admission, the patient was in acute     respiratory distress, requiring BiPAP therapy secondary to the     increased work of breathing.  Chest x-ray findings were concerning     for community-acquired pneumonia.  Therefore, the patient was     started on Rocephin and azithromycin, initiated on BiPAP.     Secondary to her diffuse wheezing, the patient was also started on     IV Solu-Medrol.  Within 24 hours, the patient had dramatic clinical     improvement of her symptomatology and was able to be weaned off     from the BiPAP  to nasal cannula and eventually was able to     oxygenate well on room air by the time of discharge.  Secondary to     the patient's acute response to the IV Solu-Medrol therapy as well     as chest x-ray findings, acute pulmonary complications from the     patient's SLE cannot be excluded.  The patient will be discharged     home to complete a 7-day course of oral antibiotics, and will be     discharged home on a prednisone taper.  We also considered other     differential diagnoses including new onset congestive heart     failure, however, a 2D echocardiogram which was ordered did not     indicate reduction of systolic function and only had commented on     indeterminate diastolic function, additionally, there is no overt     signs of volume overload on clinical exam.  Lastly, the patient's     atrial fibrillation was well controlled throughout the hospital     course on her home beta-blocker therapy, therefore, thought     unlikely to have contributed towards her shortness of breath.     Urine Legionella, urine strep and influenza were negative as well.     LDH and haptoglobin are within normal limits. 2. SLE - on chronic steroid therapy.  The patient was continued on     steroids, which were escalated to stress dose steroids during the     acute hospital course.  She has pulmonary complication of SLE could     not  be excluded given the patient's abrupt response to steroid     therapy.  She will be discharged home on a tapered regimen of the     prednisone. 3. Hypertension - continued on home medications. 4. Chronic atrial fibrillation, rate controlled on home beta-blocker     therapy.  The patient's AFib continue to be rate controlled during     the hospital course.  She is not on Coumadin therapy secondary to     history of Coumadin toxicity with INRs at 7, and history of major     bleeding complications of upper extremity hematoma.  Therefore, the     patient was only continued on her beta-blocker therapy. 5. Diabetes mellitus type 2., hemoglobin A1c 5.8 in September 2012.     Patient is currently not on any diabetic medications secondary to     intolerance of metformin, and was recently discontinued of her oral     regimen by her primary care physician.  She was continued on only     sliding scale insulin during the hospital course in the setting of     prednisone IV and oral steroid usage.  She was not be discharged     home on diabetes medication with the understanding that once her     steroids are tapered that she will have better control of her blood     sugars. 6. Hypokalemia, likely secondary to increased Lasix dosage used during     the hospital course.  The patient was repleted of potassium and     will be discharged home on minimal calcium supplementation with     close follow up with the [facility] with     recheck of BMET to determine if further supplementation is     necessary.  DISCHARGE VITAL SIGNS:  Temperature 98.3, pulse 94, respirations 18, blood pressure 151/94, O2  saturation 94% on room air.  DISCHARGE LABS:  Blood cultures negative x2.  C. diff PCR negative. BMET:  Sodium 142, potassium 3.4, chloride 102, bicarb 26, glucose 198, BUN 16, creatinine 0.62.    ______________________________ Johnette Abraham,  DO   ______________________________ Cliffton Asters, M.D.    SK/MEDQ  D:  07/16/2011  T:  07/16/2011  Job:  [id number]  cc:   Vernice Jefferson, MD  Electronically Signed by Johnette Abraham DO on 07/17/2011 08:38:50 AM Electronically Signed by Cliffton Asters M.D. on 08/01/2011 03:18:19 PM

## 2011-09-01 ENCOUNTER — Other Ambulatory Visit: Payer: Self-pay | Admitting: *Deleted

## 2011-09-02 ENCOUNTER — Other Ambulatory Visit: Payer: Self-pay | Admitting: Internal Medicine

## 2011-09-02 MED ORDER — OMEPRAZOLE 40 MG PO CPDR: 40.0000 mg | DELAYED_RELEASE_CAPSULE | Freq: Every day | ORAL | Status: DC

## 2011-09-07 ENCOUNTER — Other Ambulatory Visit: Payer: Self-pay | Admitting: Internal Medicine

## 2011-09-14 ENCOUNTER — Encounter: Payer: Self-pay | Admitting: Internal Medicine

## 2011-09-14 ENCOUNTER — Ambulatory Visit (INDEPENDENT_AMBULATORY_CARE_PROVIDER_SITE_OTHER): Payer: Medicare Other | Admitting: Internal Medicine

## 2011-09-14 VITALS — BP 140/81 | HR 69 | Temp 97.7°F | Ht 70.0 in | Wt 195.6 lb

## 2011-09-14 DIAGNOSIS — I10 Essential (primary) hypertension: Secondary | ICD-10-CM

## 2011-09-14 DIAGNOSIS — E785 Hyperlipidemia, unspecified: Secondary | ICD-10-CM

## 2011-09-14 DIAGNOSIS — I4891 Unspecified atrial fibrillation: Secondary | ICD-10-CM

## 2011-09-14 DIAGNOSIS — E119 Type 2 diabetes mellitus without complications: Secondary | ICD-10-CM

## 2011-09-14 DIAGNOSIS — R197 Diarrhea, unspecified: Secondary | ICD-10-CM

## 2011-09-14 DIAGNOSIS — M545 Low back pain, unspecified: Secondary | ICD-10-CM

## 2011-09-14 DIAGNOSIS — E559 Vitamin D deficiency, unspecified: Secondary | ICD-10-CM

## 2011-09-14 LAB — BASIC METABOLIC PANEL
BUN: 13 mg/dL (ref 6–23)
Calcium: 9.8 mg/dL (ref 8.4–10.5)
Chloride: 107 mEq/L (ref 96–112)
Creat: 0.69 mg/dL (ref 0.50–1.10)

## 2011-09-14 LAB — POCT GLYCOSYLATED HEMOGLOBIN (HGB A1C): Hemoglobin A1C: 7

## 2011-09-14 LAB — GLUCOSE, CAPILLARY: Glucose-Capillary: 99 mg/dL (ref 70–99)

## 2011-09-14 MED ORDER — CALCIUM CARBONATE-VITAMIN D 600-400 MG-UNIT PO CHEW: 1.0000 | CHEWABLE_TABLET | Freq: Two times a day (BID) | ORAL | Status: DC

## 2011-09-14 MED ORDER — PRAVASTATIN SODIUM 40 MG PO TABS: 40.0000 mg | ORAL_TABLET | Freq: Every day | ORAL | Status: DC

## 2011-09-14 NOTE — Assessment & Plan Note (Signed)
Lab Results  Component Value Date   HGBA1C 7.0 09/14/2011   HGBA1C 6.6 11/24/2009   CREATININE 0.87 07/15/2011   CREATININE 0.62 07/12/2011   MICROALBUR 4.82* 06/28/2011   MICRALBCREAT 17.7 06/28/2011   CHOL 143 06/28/2011   HDL 52 06/28/2011   TRIG 102 06/28/2011   Last eye exam and foot exam:    Component Value Date/Time   HMDIABEYEEXA DIAB FootExam Negative negative 01/17/11 09/14/11    Assessment: Diabetes control: controlled Progress toward goals: at goal Barriers to meeting goals:  Patient is chronically on prednisone for Lupus  Plan: Diabetes treatment: continue current medications of Glipizide 5mg  (will not increase at this visit, given age and risk of hypoglycemia being greater than risk of hyperglycemia), Lisinopril 2.5mg  daily, Pravastatin 40mg  daily Refer to: none Instruction/counseling given: reminded to bring blood glucose meter & log to each visit and reminded to bring medications to each visit

## 2011-09-14 NOTE — Assessment & Plan Note (Signed)
RRR, asymptomatic, rate control with metoprolol, not on coumadin given h/o GIB

## 2011-09-14 NOTE — Assessment & Plan Note (Signed)
Pain well managed with tramadol 50mg  once daily.  Only uses if feels pain is excruciating.

## 2011-09-14 NOTE — Patient Instructions (Addendum)
-  Please continue taking glipizide 5mg  daily, and start taking Calcium-Vit D supplementation, as your Vitamin D levels have been low. -If you have any new or worsening symptoms (such as low blood sugar), please call the clinic at [phone] or go to your nearest emergency department. -We will discontinue your klor-con for now, if your blood work shows any abnormalities, we will call you to restart medication.  -I have sent in refills for your Pravastatin and Calcium-Vit D supplements.    -Return in 3 months for a diabetes check up.  Please be sure to bring all your medications and glucometer with you at this visit.  -Keep up the great work with your medications!

## 2011-09-14 NOTE — Assessment & Plan Note (Signed)
Lab Results  Component Value Date   NA 140 07/15/2011   K 4.0 07/15/2011   CL 103 07/15/2011   CO2 26 07/15/2011   BUN 17 07/15/2011   CREATININE 0.87 07/15/2011   CREATININE 0.62 07/12/2011    BP Readings from Last 3 Encounters:  09/14/11 140/81  07/15/11 132/79  06/24/11 138/85    Assessment: Hypertension control:  mildly elevated  Progress toward goals:  deteriorated Barriers to meeting goals:  no barriers identified  Plan: Hypertension treatment:  continue current medications, In the recent past, pt has been at goal, therefore I am hesitant to increase dosage at this point.  Will continue to monitor. May consider increasing Lisinopril at next visit.

## 2011-09-14 NOTE — Assessment & Plan Note (Signed)
Pt has not been taking Caltrate.  Prescription refilled today.

## 2011-09-14 NOTE — Assessment & Plan Note (Signed)
Currently well managed (LDL = 71) on Pravastatin 40mg  daily.  Given anti-inflammatory properties of cholesterol, if chol levels significantly decrease next year, will consider decreasing statin in the setting of autoimmune disease (lupus).

## 2011-09-14 NOTE — Assessment & Plan Note (Signed)
Resolved.  Likely 2/2 metformin.

## 2011-09-14 NOTE — Progress Notes (Signed)
Subjective:   Patient ID: Stephanie Horne female   DOB: [birth year] 70 y.o.   MRN: [id number]  HPI: Stephanie Horne is a 70 y.o. woman that presents for DM follow up.  She was diagnosed with Type 2 DM in 2009, after experiencing a coma 2/2 hyperglycemia.  In 06/2011, we discontinued metformin 2/2 chronic diarrhea.  Diarrhea has since resolved, and now she has 1-2 normal, formed BMs per day without using antidiarrheal medication.  In October 2012, she presented to the clinic for HFU, during which visit she was started on Glipizide 5mg  daily, which she is tolerating well.  She notes CBGs ranging from 72-130.  She uses a One Touch Ultra Glucometer, and checks sugar every morning.  No recent episodes of hypoglycemia noted.  Glucometer readings were downloaded and reviewed.  She reports 3 meals/ day.  Breakfast can include pancakes, bacon, grits, cereal, egg whites.  Lunch is usually a sandwhich + juice.  Supper is a meat + vegetables + starch.  Snacks include fruits.    She notes compliance with all medications except for Caltrate.  Despite low Vit D levels, she notes she has more energy than before and reports good mood.    Past Medical History  Diagnosis Date  . Hyperlipidemia   . Hypertension   . Back pain, chronic   . Lupus   . Seasonal allergies   . Mitral regurgitation   . Atrial fibrillation     not on coumidin because AIHA and major bleeding complications  . Axillary lymphadenopathy     left negative MMG 1/08  . AIHA (autoimmune hemolytic anemia)     12/08 onset  . Diabetes mellitus    Current Outpatient Prescriptions  Medication Sig Dispense Refill  . alendronate (FOSAMAX) 70 MG tablet Take 1 tablet (70 mg total) by mouth every 7 (seven) days. Take with a full glass of water on an empty stomach.  12 tablet  3  . aspirin 81 MG EC tablet Take 81 mg by mouth daily.        . Blood Glucose Monitoring Suppl (ONE TOUCH ULTRA 2) W/DEVICE KIT Use to test blood sugars  1 each  0  . fish  oil-omega-3 fatty acids 1000 MG capsule Take 1 capsule (1 g total) by mouth daily.  30 capsule  11  . folic acid (FOLVITE) 1 MG tablet Take 1 tablet (1 mg total) by mouth daily.  90 tablet  2  . furosemide (LASIX) 40 MG tablet Take 40 mg by mouth daily.        Marland Kitchen glipiZIDE (GLUCOTROL) 5 MG tablet Take 5 mg by mouth Daily.      Marland Kitchen glucose blood test strip Use as instructed  100 each  12  . lisinopril (PRINIVIL,ZESTRIL) 2.5 MG tablet Take 1 tablet (2.5 mg total) by mouth daily.  30 tablet  3  . metoprolol (LOPRESSOR) 100 MG tablet Take 1 tablet (100 mg total) by mouth 2 (two) times daily.  60 tablet  6  . omeprazole (PRILOSEC) 40 MG capsule Take 1 capsule (40 mg total) by mouth daily.  30 capsule  3  . pravastatin (PRAVACHOL) 40 MG tablet Take 1 tablet (40 mg total) by mouth daily.  30 tablet  3  . predniSONE (DELTASONE) 10 MG tablet 6 FOR 2 DAYS, 4 FOR 2 DAYS, 2 FOR 2 DAYS, THEN CONTINUE WITH 1 DAILY  60 tablet  0  . traMADol (ULTRAM) 50 MG tablet TAKE 1 TABLET BY MOUTH EVERY  6 HOURS AS NEEDED FOR PAIN  30 tablet  3  . Calcium Carbonate-Vitamin D (CALTRATE 600+D) 600-400 MG-UNIT per chew tablet Chew 1 tablet by mouth 2 (two) times daily.  30 tablet  2  . nitroGLYCERIN (NITROSTAT) 0.4 MG SL tablet Place 1 tablet (0.4 mg total) under the tongue every 5 (five) minutes as needed.  90 tablet  0   Family History  Problem Relation Age of Onset  . Heart disease Mother    History   Social History  . Marital Status: Married    Spouse Name: N/A    Number of Children: N/A  . Years of Education: N/A   Social History Main Topics  . Smoking status: Former Games developer  . Smokeless tobacco: None  . Alcohol Use: No  . Drug Use: No  . 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 None   Social History Narrative  . None   Review of Systems: Constitutional: Denies fever,  chills, diaphoresis, appetite change and fatigue.  HEENT: Denies congestion, sore throat, rhinorrhea, sneezing Respiratory: Denies SOB, DOE, cough, chest tightness,  and wheezing.   Cardiovascular: Denies chest pain, palpitations and leg swelling.  Gastrointestinal: Denies nausea, vomiting, abdominal pain, diarrhea, constipation, blood in stool and abdominal distention.  Genitourinary: Denies dysuria, urgency, frequency, hematuria, and difficulty urinating.   Neurological: Denies dizziness, weakness, light-headedness, and headaches.  Psychiatric/Behavioral: Denies suicidal ideation, mood changes, nervousness, sleep disturbance   Objective:  Physical Exam: Filed Vitals:   09/14/11 1407  BP: 140/81  Pulse: 69  Temp: 97.7 F (36.5 C)  TempSrc: Oral  Height: 5\' 10"  (1.778 m)  Weight: 195 lb 9.6 oz (88.724 kg)   Constitutional: Vital signs reviewed.  Cooperative with exam.  Mouth: no erythema or exudates, MMM Eyes: PERRL, EOMI, conjunctivae normal, No scleral icterus.  Cardiovascular: RRR, S1 normal, S2 normal, no MRG, pulses symmetric and intact bilaterally Pulmonary/Chest: CTAB, no wheezes, rales, or rhonchi Abdominal: Soft. Non-tender, non-distended, bowel sounds are normal Musculoskeletal: ROM full and nontender Neurological: A&O x3, Strenght is normal and symmetric bilaterally, cranial nerve II-XII are grossly intact, no focal motor deficit, sensory intact to light touch bilaterally.  Skin: Warm, dry and intact. No rash, cyanosis, or clubbing.  Psychiatric: Normal mood and affect. speech and behavior is normal. Judgment and thought content normal. Cognition and memory are normal.   Diabetic Foot Exam: Inspection:b/l bunyons (non tender), no ulcers Circulation: normal Sensation: microfilament sensation normal  Assessment & Plan:  Case and care discussed with Dr. Phillips Odor.  Patient to return in 3 months for DM check.  Please see problem oriented charting for further details.

## 2011-10-11 ENCOUNTER — Other Ambulatory Visit: Payer: Self-pay | Admitting: Internal Medicine

## 2011-10-12 ENCOUNTER — Other Ambulatory Visit: Payer: Self-pay | Admitting: *Deleted

## 2011-10-12 ENCOUNTER — Other Ambulatory Visit: Payer: Self-pay | Admitting: Internal Medicine

## 2011-10-12 MED ORDER — PREDNISONE 10 MG PO TABS: 10.0000 mg | ORAL_TABLET | Freq: Every day | ORAL | Status: DC

## 2011-10-12 MED ORDER — FOLIC ACID 1 MG PO TABS: 1.0000 mg | ORAL_TABLET | Freq: Every day | ORAL | Status: DC

## 2011-10-12 NOTE — Telephone Encounter (Signed)
I spoke with Ms. Sautter. She is on prednisone 10mg  a day and has been told by her rheumalogist, Dr. Phylliss Bob, to stay on this. She does not however need this taper that was sent from pharmacy. Therefore will refuse this script and send in a new one for 10mg  q day.

## 2011-11-05 ENCOUNTER — Other Ambulatory Visit: Payer: Self-pay | Admitting: Internal Medicine

## 2011-11-09 ENCOUNTER — Other Ambulatory Visit: Payer: Self-pay | Admitting: *Deleted

## 2011-11-09 DIAGNOSIS — E119 Type 2 diabetes mellitus without complications: Secondary | ICD-10-CM

## 2011-11-09 MED ORDER — GLIPIZIDE 5 MG PO TABS: 5.0000 mg | ORAL_TABLET | Freq: Every day | ORAL | Status: DC

## 2011-12-07 ENCOUNTER — Encounter: Payer: Self-pay | Admitting: Internal Medicine

## 2011-12-07 ENCOUNTER — Ambulatory Visit (INDEPENDENT_AMBULATORY_CARE_PROVIDER_SITE_OTHER): Payer: Medicare Other | Admitting: Internal Medicine

## 2011-12-07 VITALS — BP 135/75 | HR 78 | Temp 98.0°F | Wt 209.6 lb

## 2011-12-07 DIAGNOSIS — M329 Systemic lupus erythematosus, unspecified: Secondary | ICD-10-CM

## 2011-12-07 DIAGNOSIS — J019 Acute sinusitis, unspecified: Secondary | ICD-10-CM | POA: Insufficient documentation

## 2011-12-07 DIAGNOSIS — I10 Essential (primary) hypertension: Secondary | ICD-10-CM

## 2011-12-07 DIAGNOSIS — E119 Type 2 diabetes mellitus without complications: Secondary | ICD-10-CM

## 2011-12-07 DIAGNOSIS — I4891 Unspecified atrial fibrillation: Secondary | ICD-10-CM

## 2011-12-07 DIAGNOSIS — Z79899 Other long term (current) drug therapy: Secondary | ICD-10-CM

## 2011-12-07 DIAGNOSIS — R609 Edema, unspecified: Secondary | ICD-10-CM

## 2011-12-07 DIAGNOSIS — K219 Gastro-esophageal reflux disease without esophagitis: Secondary | ICD-10-CM

## 2011-12-07 MED ORDER — FUROSEMIDE 40 MG PO TABS: 40.0000 mg | ORAL_TABLET | Freq: Every day | ORAL | Status: DC

## 2011-12-07 MED ORDER — CALCIUM CARBONATE-VITAMIN D 600-400 MG-UNIT PO CHEW: 1.0000 | CHEWABLE_TABLET | Freq: Two times a day (BID) | ORAL | Status: DC

## 2011-12-07 MED ORDER — OMEPRAZOLE 40 MG PO CPDR: 40.0000 mg | DELAYED_RELEASE_CAPSULE | Freq: Every day | ORAL | Status: DC

## 2011-12-07 MED ORDER — HYDROCODONE-ACETAMINOPHEN 5-500 MG PO TABS: 1.0000 | ORAL_TABLET | Freq: Four times a day (QID) | ORAL | Status: DC | PRN

## 2011-12-07 MED ORDER — PRAVASTATIN SODIUM 40 MG PO TABS: 40.0000 mg | ORAL_TABLET | Freq: Every day | ORAL | Status: DC

## 2011-12-07 MED ORDER — AMOXICILLIN-POT CLAVULANATE 875-125 MG PO TABS: 1.0000 | ORAL_TABLET | Freq: Two times a day (BID) | ORAL | Status: AC

## 2011-12-07 MED ORDER — METOPROLOL TARTRATE 100 MG PO TABS: 100.0000 mg | ORAL_TABLET | Freq: Two times a day (BID) | ORAL | Status: DC

## 2011-12-07 MED ORDER — FOLIC ACID 1 MG PO TABS: 1.0000 mg | ORAL_TABLET | Freq: Every day | ORAL | Status: DC

## 2011-12-07 MED ORDER — DICLOFENAC SODIUM 1 % TD GEL: 1.0000 "application " | Freq: Four times a day (QID) | TRANSDERMAL | Status: DC

## 2011-12-07 NOTE — Assessment & Plan Note (Signed)
Stable.  No sx of anemia.  Continue prednisone indefinitely. May consider CBC at next visit. Last Hb=13.2 in 07/2011

## 2011-12-07 NOTE — Progress Notes (Signed)
Subjective:   Patient ID: Stephanie Horne female   DOB: 06-29-1941 71 y.o.   MRN: [id number]  HPI: [name] is a 71 y.o. F who presents for DM f/u but also c/o sinus infection for last 3-4 weeks with rhinorrhea but no fever.  She also c/o worsening joint pain 2/2 SLE in her wrists and knees.  Apparently, her tramadol stopped working well since last visit 3 months ago.  Regarding her DM, she is being treated with glipizde.  Metformin was d/c due to chronic diarrhea, which has since resolved. HbA1c = 6.8 today Type 2 DM, Diagnosed: May 2009 Checks blood sugar: 1-2/day, checks a 2nd time if CBG >120 ; glucometer downloaded and reviewed. Number of high CBG: 163 Number of low CBG: 85 Associated symptoms: Polyuria, polydipsia, blurry vision? Symptoms of hypoglycemia? No Last Eye exam: 01/2011, plans to call soon to make appt Last Lipid Panel: Sept 2012, LDL -71 Exercise?  Cleaning house, walks outside, plans to start silver sneakers at [facility] Diet?  Good appetite, eats fruits/vegetables, meat occasionally  Tells me she was on lasix because of LE edema, Cr = 0.69 09/2011.  Past Medical History  Diagnosis Date  . Hyperlipidemia   . Hypertension   . Back pain, chronic   . Lupus     Treated with prednisone indefinitely   . Seasonal allergies   . Mitral regurgitation   . Atrial fibrillation     not on coumidin because AIHA and major bleeding complications  . Axillary lymphadenopathy     left negative MMG 1/08  . AIHA (autoimmune hemolytic anemia)     12/08 onset  . Diabetes mellitus    Current Outpatient Prescriptions  Medication Sig Dispense Refill  . alendronate (FOSAMAX) 70 MG tablet Take 1 tablet (70 mg total) by mouth every 7 (seven) days. Take with a full glass of water on an empty stomach.  12 tablet  3  . aspirin 81 MG EC tablet Take 81 mg by mouth daily.        . Blood Glucose Monitoring Suppl (ONE TOUCH ULTRA 2) W/DEVICE KIT Use to test blood sugars  1 each  0  .  Calcium Carbonate-Vitamin D (CALTRATE 600+D) 600-400 MG-UNIT per chew tablet Chew 1 tablet by mouth 2 (two) times daily.  30 tablet  2  . fish oil-omega-3 fatty acids 1000 MG capsule Take 1 capsule (1 g total) by mouth daily.  30 capsule  11  . folic acid (FOLVITE) 1 MG tablet Take 1 tablet (1 mg total) by mouth daily.  90 tablet  2  . furosemide (LASIX) 40 MG tablet Take 40 mg by mouth daily.        Marland Kitchen glipiZIDE (GLUCOTROL) 5 MG tablet Take 1 tablet (5 mg total) by mouth daily.  30 tablet  5  . glucose blood test strip Use as instructed  100 each  12  . lisinopril (PRINIVIL,ZESTRIL) 2.5 MG tablet TAKE 1 TABLET (2.5 MG TOTAL) BY MOUTH DAILY.  30 tablet  11  . metoprolol (LOPRESSOR) 100 MG tablet Take 1 tablet (100 mg total) by mouth 2 (two) times daily.  60 tablet  6  . omeprazole (PRILOSEC) 40 MG capsule Take 1 capsule (40 mg total) by mouth daily.  30 capsule  3  . pravastatin (PRAVACHOL) 40 MG tablet Take 1 tablet (40 mg total) by mouth daily.  30 tablet  3  . predniSONE (DELTASONE) 10 MG tablet Take 1 tablet (10 mg total) by mouth  daily.  31 tablet  11  . traMADol (ULTRAM) 50 MG tablet TAKE 1 TABLET BY MOUTH EVERY 6 HOURS AS NEEDED FOR PAIN  30 tablet  3  . nitroGLYCERIN (NITROSTAT) 0.4 MG SL tablet Place 1 tablet (0.4 mg total) under the tongue every 5 (five) minutes as needed.  90 tablet  0   Family History  Problem Relation Age of Onset  . Heart disease Mother    History   Social History  . Marital Status: Married    Spouse Name: N/A    Number of Children: N/A  . Years of Education: N/A   Social History Main Topics  . Smoking status: Former Games developer  . Smokeless tobacco: None  . Alcohol Use: No  . Drug Use: No  . 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 None   Social History Narrative  . None   Review of Systems: General: no fevers,  changes in weight, changes in appetite, +chills Skin: no rash HEENT: no blurry vision, hearing changes, sore throat Pulm: no dyspnea, wheezing CV: no chest pain, palpitations, shortness of breath Abd: no abdominal pain, nausea/vomiting, diarrhea/constipation GU: no dysuria, hematuria, polyuria Ext: no myalgias Neuro: no weakness, numbness, or tingling   Objective:  Physical Exam: Filed Vitals:   12/07/11 1604  BP: 135/75  Pulse: 78  Temp: 98 F (36.7 C)  TempSrc: Oral  Weight: 209 lb 9.6 oz (95.074 kg)   Constitutional: Vital signs reviewed.  Patient is a well-developed and well-nourished woman in no acute distress and cooperative with exam.  Head: b/l frontal and maxillary sinus tenderness Mouth: no erythema or exudates, MMM Eyes: PERRL, EOMI, conjunctivae normal, No scleral icterus.  Neck: No thyromegaly  Cardiovascular: RRR, S1 normal, S2 normal, no MRG, pulses symmetric and intact bilaterally Pulmonary/Chest: CTAB, no wheezes, rales, or rhonchi Abdominal: Soft. Non-tender, non-distended, bowel sounds are normal, no masses, organomegaly, or guarding present.  Musculoskeletal: No joint deformities, erythema, or stiffness, ROM full and nontender Neurological: A&O x3, Strength is normal and symmetric bilaterally, cranial nerve II-XII are grossly intact, no focal motor deficit, sensory intact to light touch bilaterally.  Skin: Warm, dry and intact. No rash, cyanosis, or clubbing.  Psychiatric: Normal mood and affect. speech and behavior is normal. Judgment and thought content normal. Cognition and memory are normal.   Assessment & Plan:  Case and care discussed with Dr. Rogelia Boga.  Patient to return for DM in 6 months, but sooner if needed.  Please see problem oriented charting for further details.

## 2011-12-07 NOTE — Assessment & Plan Note (Addendum)
Lab Results  Component Value Date   HGBA1C 6.8 12/07/2011   HGBA1C 6.6 11/24/2009   CREATININE 0.69 09/14/2011   CREATININE 0.62 07/12/2011   MICROALBUR 4.82* 06/28/2011   MICRALBCREAT 17.7 06/28/2011   CHOL 143 06/28/2011   HDL 52 06/28/2011   TRIG 102 06/28/2011    Last eye exam and foot exam:    Component Value Date/Time   HMDIABEYEEXA negative 05/25/2009    Assessment: Diabetes control: controlled Progress toward goals: at goal Barriers to meeting goals: no barriers identified  Plan: Diabetes treatment: continue current medications, Continue glipizide; patient not on metformin 2/2 diarrhea Refer to: none Instruction/counseling given: reminded to get eye exam, reminded to bring blood glucose meter & log to each visit and reminded to bring medications to each visit  Will defer 3 month visit, and have patient return in 6 months (but may return sooner if needed), since patient has been well controlled and is compliant

## 2011-12-07 NOTE — Assessment & Plan Note (Signed)
Continue prednisone daily.  Because arthritis has been worsening for last 3 months, will treat with Vicodin and Voltaren gel.  Patient to call if no relief.

## 2011-12-07 NOTE — Assessment & Plan Note (Signed)
RRR, asymptomatic, rate ctrl with metoprolol, not on coumadin given h/o GIB

## 2011-12-07 NOTE — Patient Instructions (Signed)
-  Please complete 7 days of antibiotics for your sinus infection.  If you do not get better, please call me at the clinic, [phone]  -For your joint pain, we will try Vicodin, as well as voltaren gel.  Again, call if you don't get relief.  -Return in 6 months for Diabetes follow up.  Please be sure to bring all of your medications with you to every visit.  Should you have any new or worsening symptoms, please be sure to call the clinic at [phone].

## 2011-12-07 NOTE — Assessment & Plan Note (Signed)
No evidence of heart failure or renal failure, but some pitting edema evident on exam.  Continue Lasix 40mg  daily.

## 2011-12-07 NOTE — Assessment & Plan Note (Signed)
Lab Results  Component Value Date   NA 140 09/14/2011   K 4.3 09/14/2011   CL 107 09/14/2011   CO2 23 09/14/2011   BUN 13 09/14/2011   CREATININE 0.69 09/14/2011   CREATININE 0.62 07/12/2011    BP Readings from Last 3 Encounters:  12/07/11 135/75  09/14/11 140/81  07/15/11 132/79    Assessment: Hypertension control:  mildly elevated  Progress toward goals:  improved Barriers to meeting goals:  no barriers identified  Plan: Hypertension treatment:  continue current medications

## 2011-12-07 NOTE — Assessment & Plan Note (Signed)
Will plan to treat with Augmentin for 7d given duration of symptoms.  If no relief, she will call back, and I plan to prescribe flonase, saline rinse & afrin (for 2-3d) for symptom relief.

## 2012-01-01 ENCOUNTER — Other Ambulatory Visit: Payer: Self-pay | Admitting: Internal Medicine

## 2012-01-27 ENCOUNTER — Other Ambulatory Visit: Payer: Self-pay | Admitting: Internal Medicine

## 2012-02-22 ENCOUNTER — Telehealth: Payer: Self-pay | Admitting: *Deleted

## 2012-02-22 ENCOUNTER — Ambulatory Visit (HOSPITAL_COMMUNITY)
Admission: RE | Admit: 2012-02-22 | Discharge: 2012-02-22 | Disposition: A | Discharge status: Home / Self Care | Payer: Medicare Other | Source: Ambulatory Visit | Attending: Internal Medicine | Admitting: Internal Medicine

## 2012-02-22 ENCOUNTER — Ambulatory Visit (INDEPENDENT_AMBULATORY_CARE_PROVIDER_SITE_OTHER): Payer: Medicare Other | Admitting: Internal Medicine

## 2012-02-22 ENCOUNTER — Encounter: Payer: Self-pay | Admitting: Internal Medicine

## 2012-02-22 VITALS — BP 135/80 | HR 87 | Temp 98.8°F | Ht 70.0 in | Wt 212.6 lb

## 2012-02-22 DIAGNOSIS — R0602 Shortness of breath: Secondary | ICD-10-CM | POA: Insufficient documentation

## 2012-02-22 DIAGNOSIS — I10 Essential (primary) hypertension: Secondary | ICD-10-CM

## 2012-02-22 DIAGNOSIS — F32A Depression, unspecified: Secondary | ICD-10-CM

## 2012-02-22 DIAGNOSIS — R05 Cough: Secondary | ICD-10-CM

## 2012-02-22 DIAGNOSIS — R059 Cough, unspecified: Secondary | ICD-10-CM | POA: Insufficient documentation

## 2012-02-22 DIAGNOSIS — E119 Type 2 diabetes mellitus without complications: Secondary | ICD-10-CM

## 2012-02-22 DIAGNOSIS — F329 Major depressive disorder, single episode, unspecified: Secondary | ICD-10-CM

## 2012-02-22 DIAGNOSIS — I059 Rheumatic mitral valve disease, unspecified: Secondary | ICD-10-CM

## 2012-02-22 DIAGNOSIS — B370 Candidal stomatitis: Secondary | ICD-10-CM

## 2012-02-22 HISTORY — DX: Depression, unspecified: F32.A

## 2012-02-22 LAB — GLUCOSE, CAPILLARY: Glucose-Capillary: 128 mg/dL — ABNORMAL HIGH (ref 70–99)

## 2012-02-22 MED ORDER — NYSTATIN 100000 UNIT/ML MT SUSP: 500000.0000 [IU] | Freq: Four times a day (QID) | OROMUCOSAL | Status: AC

## 2012-02-22 MED ORDER — AMOXICILLIN-POT CLAVULANATE 875-125 MG PO TABS: 1.0000 | ORAL_TABLET | Freq: Two times a day (BID) | ORAL | Status: DC

## 2012-02-22 MED ORDER — SERTRALINE HCL 25 MG PO TABS: 25.0000 mg | ORAL_TABLET | Freq: Every day | ORAL | Status: DC

## 2012-02-22 MED ORDER — DOXYCYCLINE HYCLATE 100 MG PO TABS: 100.0000 mg | ORAL_TABLET | Freq: Two times a day (BID) | ORAL | Status: AC

## 2012-02-22 NOTE — Assessment & Plan Note (Signed)
Lab Results  Component Value Date   NA 140 09/14/2011   K 4.3 09/14/2011   CL 107 09/14/2011   CO2 23 09/14/2011   BUN 13 09/14/2011   CREATININE 0.69 09/14/2011   CREATININE 0.62 07/12/2011    BP Readings from Last 3 Encounters:  02/22/12 135/80  12/07/11 135/75  09/14/11 140/81    Assessment: Hypertension control:  controlled  Progress toward goals:  at goal Barriers to meeting goals:  no barriers identified  Plan: Hypertension treatment:  continue current medications check bmet

## 2012-02-22 NOTE — Patient Instructions (Addendum)
Please call your cardiologist to schedule an appointment.  Please go upstairs to get chest xray Please take antibiotics as directed for 5 days.  Please start Zoloft 25 mg taken once daily. Please follow up in 1 month.

## 2012-02-22 NOTE — Assessment & Plan Note (Signed)
Lab Results  Component Value Date   HGBA1C 6.8 12/07/2011   HGBA1C 6.6 11/24/2009   CREATININE 0.69 09/14/2011   CREATININE 0.62 07/12/2011   MICROALBUR 4.82* 06/28/2011   MICRALBCREAT 17.7 06/28/2011   CHOL 143 06/28/2011   HDL 52 06/28/2011   TRIG 102 06/28/2011    Last eye exam and foot exam:    Component Value Date/Time   HMDIABEYEEXA negative 05/25/2009    Assessment: Diabetes control: controlled Progress toward goals: at goal Barriers to meeting goals: no barriers identified  Plan: Diabetes treatment: continue current medications Refer to: none Instruction/counseling given: reminded to bring blood glucose meter & log to each visit and reminded to bring medications to each visit

## 2012-02-22 NOTE — Telephone Encounter (Signed)
AT THE REQUEST OF DR Baltazar Apo, CALLED PATIENT AND LEFT VOICE MESSAGE TO LET PATIENT KNOW THAT SHE HAS CHANGED HER AUGMENTIN TO DOXYCYCLINE. THIS MEDICATION HAS BEEN SENT TO HER PHARMACY. Luian Schumpert NT [phone]   5:16PM.

## 2012-02-23 LAB — BASIC METABOLIC PANEL
BUN: 13 mg/dL (ref 6–23)
Calcium: 8.9 mg/dL (ref 8.4–10.5)
Glucose, Bld: 113 mg/dL — ABNORMAL HIGH (ref 70–99)

## 2012-02-24 NOTE — Progress Notes (Signed)
Subjective:     Patient ID: Stephanie Horne, female   DOB: 1941/05/01, 71 y.o.   MRN: [id number]  HPI Stephanie Horne is a 71 year old woman with pmh significant for moderate mitral regurg, a-fib, HTN, HLD and lower extremity dependent edema who presents for the following:  1.) Sinus congestion - patient has history of allergic rhinitis but reports current symptoms are worse than usual rhinitis. Complains of congestion, cough that is not productive. No fevers, or chills. Great granddaughter and other grandchildren have been sick around the patient.   2.) Depression - patient's daughter feels that the patient is depressed ever since the death of patient's husband last March. More recently, patient's brother also passed away. Patient is not interested in activities that she was once interested in, but no disturbance in sleep or appetite.   Review of Systems  Constitutional: Positive for activity change. Negative for fever and chills.  HENT: Positive for congestion, rhinorrhea and postnasal drip. Negative for ear pain, sneezing and ear discharge.   Eyes: Positive for itching.  Respiratory: Positive for cough.        Objective:   Physical Exam  Constitutional: She is oriented to person, place, and time. She appears well-developed.  HENT:  Head: Normocephalic and atraumatic.  Right Ear: External ear normal.  Left Ear: External ear normal.  Mouth/Throat: No oropharyngeal exudate.       Nasal turbinates inflamed Maxillary sinus tenderness  Eyes:       Orbital swelling  Cardiovascular:       Irregularly irregular  Pulmonary/Chest: Effort normal. She has no wheezes. She has no rales.       Decreased breath sounds heard left lung base  Abdominal: Soft. Bowel sounds are normal.  Musculoskeletal: Normal range of motion.  Neurological: She is alert and oriented to person, place, and time.  Psychiatric:       Flat affect

## 2012-02-24 NOTE — Assessment & Plan Note (Signed)
No recent cardiology evaluation and pt complains of dyspnea on exertion. Will refer back to cards for Echo.

## 2012-02-24 NOTE — Assessment & Plan Note (Signed)
Signs and symptoms of URI vs acute sinusitis causing post nasal drip. Given immunocompromised state (on chronic prednisone for lupus), and physical exam findings of decreased breath sounds on left lung base, checked CXR.   Impression: Bronchial thickening suggesting bronchitis. Mild patchy density at the left lung base that could be scarring and/or mild left base pneumonia.  No fever, and not ill appearing. As discussed with Dr. Maurice March, will prescribe doxycycline and advised patient to contact clinic if symptoms worsen or do not improve.

## 2012-02-24 NOTE — Assessment & Plan Note (Signed)
Likely secondary to prednisone. Will prescribe nystatin swish and swallow.

## 2012-02-24 NOTE — Assessment & Plan Note (Signed)
Patient admits to decreased energy and interest in the activities since her husband's passing last March 2012. Given duration of symptoms, this would be classified as complicated grief. Patient also suffered from depression prior to husband's death. Will start low dose sertraline and have patient return in 1 month. Advised patient to contact clinic for any associated side effects related to medication.

## 2012-03-13 ENCOUNTER — Ambulatory Visit (INDEPENDENT_AMBULATORY_CARE_PROVIDER_SITE_OTHER): Payer: Medicare Other | Admitting: Internal Medicine

## 2012-03-13 ENCOUNTER — Encounter: Payer: Self-pay | Admitting: Internal Medicine

## 2012-03-13 ENCOUNTER — Ambulatory Visit (HOSPITAL_COMMUNITY)
Admission: RE | Admit: 2012-03-13 | Discharge: 2012-03-13 | Disposition: A | Discharge status: Home / Self Care | Payer: Medicare Other | Source: Ambulatory Visit | Attending: Internal Medicine | Admitting: Internal Medicine

## 2012-03-13 VITALS — BP 140/80 | HR 94 | Temp 98.7°F | Ht 70.0 in | Wt 210.1 lb

## 2012-03-13 DIAGNOSIS — R05 Cough: Secondary | ICD-10-CM

## 2012-03-13 DIAGNOSIS — I059 Rheumatic mitral valve disease, unspecified: Secondary | ICD-10-CM

## 2012-03-13 DIAGNOSIS — Z79899 Other long term (current) drug therapy: Secondary | ICD-10-CM

## 2012-03-13 DIAGNOSIS — R059 Cough, unspecified: Secondary | ICD-10-CM | POA: Insufficient documentation

## 2012-03-13 DIAGNOSIS — E119 Type 2 diabetes mellitus without complications: Secondary | ICD-10-CM

## 2012-03-13 DIAGNOSIS — F4321 Adjustment disorder with depressed mood: Secondary | ICD-10-CM

## 2012-03-13 DIAGNOSIS — B370 Candidal stomatitis: Secondary | ICD-10-CM

## 2012-03-13 DIAGNOSIS — F329 Major depressive disorder, single episode, unspecified: Secondary | ICD-10-CM

## 2012-03-13 DIAGNOSIS — I10 Essential (primary) hypertension: Secondary | ICD-10-CM

## 2012-03-13 LAB — GLUCOSE, CAPILLARY: Glucose-Capillary: 189 mg/dL — ABNORMAL HIGH (ref 70–99)

## 2012-03-13 MED ORDER — FLUCONAZOLE 100 MG PO TABS: 100.0000 mg | ORAL_TABLET | Freq: Every day | ORAL | Status: AC

## 2012-03-13 NOTE — Assessment & Plan Note (Signed)
Still present. Will prescribe short course of fluconazole.

## 2012-03-13 NOTE — Patient Instructions (Signed)
Please stop taking Lisinopril 2.5 mg tablet. Please follow up in 1 month exactly. Please follow up with cardiology as scheduled. Please take fluconazole for thrush once a day for 14 days. Please get Chest xray done today. I will call you to follow up with results.

## 2012-03-13 NOTE — Assessment & Plan Note (Signed)
Improved with starting zoloft. Will not make any dose adjustments today and continue to follow clinically.

## 2012-03-13 NOTE — Assessment & Plan Note (Signed)
Scheduled to see cards 03/24/2012 for echo.

## 2012-03-13 NOTE — Progress Notes (Signed)
Patient ID: Stephanie Horne, female   DOB: 06-Jun-1941, 71 y.o.   MRN: [id number] Subjective:   Diabetes Pertinent negatives for hypoglycemia include no confusion or nervousness/anxiousness.   Stephanie Horne is a 71 year old woman with pmh significant for DM, moderate mitral regurg, a-fib, HTN, HLD and lower extremity dependent edema who presents for the following:  1.) Cough - patient has history of allergic rhinitis but reports that her current symptoms are worse than usual rhinitis. She was last evaluated 5/22 where she had CXR done showing early signs of PNA. She was initially prescribed z-pack but after speaking with Dr. Maurice March we changed it to doxycycline and called her to inform of the medication change. Unfortunately patient picked up both prescriptions but only took azithromycin. She brought the doxcycline bottle with her, however she did not take any tablets. She still has a dry cough, but reports sinuses are less congested. No fevers, or chills.   2.) Depression - patient's daughter feels that the patient is depressed ever since the death of patient's husband last March. More recently, patient's brother also passed away. Patient is not interested in activities that she was once interested in, but no disturbance in sleep or appetite. Patient was started on Zoloft 25 mg at last visit 05/22 and since taking this medication she reports no new side effects. Her daughter feels that her mood has improved, and while in the room patient was working on a word search puzzle.   She scheduled to see cardiology 06/22 for further evaluation of MR.   Review of Systems  Constitutional: Negative for fever and chills.  HENT: Positive for postnasal drip. Negative for congestion and rhinorrhea.   Respiratory: Positive for cough.   Gastrointestinal: Negative for nausea, vomiting and abdominal pain.  Psychiatric/Behavioral: Negative for confusion, sleep disturbance, dysphoric mood, decreased concentration and  agitation. The patient is not nervous/anxious.        Objective:   Physical Exam  Constitutional: She is oriented to person, place, and time. She appears well-developed.  HENT:  Head: Normocephalic and atraumatic.  Mouth/Throat: No oropharyngeal exudate.  Cardiovascular:       Irregularly irregular  Pulmonary/Chest: Effort normal. She has no wheezes. She has no rales.       Decreased breath sounds heard left lung base  Abdominal: Soft. Bowel sounds are normal.  Musculoskeletal: Normal range of motion.  Neurological: She is alert and oriented to person, place, and time.  Psychiatric: She has a normal mood and affect. Thought content normal.

## 2012-03-13 NOTE — Assessment & Plan Note (Signed)
Patient still with cough. Pt did not take doxycycline as mentioned in HPI because she got the azithromycin prescription filled first. She brought the doxy bottle with her that is full. She still complains of dry cough.  Plan: -Repeat CXR to evaluate if opacity has resolved -D/C ACE-I as it may be related to this medication as opposed to acute pulmonary process -Will contact patient regarding results of CXR and if she needs to start doxycycline

## 2012-03-13 NOTE — Assessment & Plan Note (Signed)
Lab Results  Component Value Date   HGBA1C 6.6 03/13/2012   HGBA1C 6.6 11/24/2009   CREATININE 0.79 02/22/2012   CREATININE 0.62 07/12/2011   MICROALBUR 4.82* 06/28/2011   MICRALBCREAT 17.7 06/28/2011   CHOL 143 06/28/2011   HDL 52 06/28/2011   TRIG 102 06/28/2011    Last eye exam and foot exam:    Component Value Date/Time   HMDIABEYEEXA negative 05/25/2009    Assessment: Diabetes control: controlled Progress toward goals: at goal Barriers to meeting goals: no barriers identified  Plan: Diabetes treatment: continue current medications Refer to: none Instruction/counseling given: reminded to bring blood glucose meter & log to each visit and reminded to bring medications to each visit

## 2012-03-13 NOTE — Assessment & Plan Note (Signed)
Lab Results  Component Value Date   NA 145 02/22/2012   K 3.7 02/22/2012   CL 109 02/22/2012   CO2 28 02/22/2012   BUN 13 02/22/2012   CREATININE 0.79 02/22/2012   CREATININE 0.62 07/12/2011    BP Readings from Last 3 Encounters:  03/13/12 140/80  02/22/12 135/80  12/07/11 135/75    Assessment: Hypertension control:  controlled  Progress toward goals:  at goal Barriers to meeting goals:  no barriers identified  Plan: Hypertension treatment:  continue current medications Will stop ACE-I as patient still with cough. Have pt follow up in 1 month

## 2012-03-15 ENCOUNTER — Other Ambulatory Visit: Payer: Self-pay | Admitting: Internal Medicine

## 2012-03-15 DIAGNOSIS — R05 Cough: Secondary | ICD-10-CM

## 2012-03-15 NOTE — Assessment & Plan Note (Signed)
CXR 03/13/2012: On the lateral density there is opacity posterior to the cardiac silhouette. This area is denser and larger than on the prior study. This could reflect a pneumonia but I cannot exclude a hiatal hernia or mass. Possibly a left atrial enlargement could be projecting this way but the appearance is larger and denser since prior study. This could be further characterized by CT of the chest.  As discussed with attending physician Dr. Josem Kaufmann, will order CT Chest with contrast to further characterize the above findings. I called patient and she reports that she is feeling better and that cough has improved. Will go ahead and schedule in 1-2 weeks as requested by patient.

## 2012-03-26 ENCOUNTER — Other Ambulatory Visit (HOSPITAL_COMMUNITY): Payer: Medicare Other

## 2012-03-27 ENCOUNTER — Encounter (HOSPITAL_COMMUNITY): Payer: Self-pay

## 2012-03-27 ENCOUNTER — Other Ambulatory Visit: Payer: Self-pay | Admitting: *Deleted

## 2012-03-27 ENCOUNTER — Ambulatory Visit (HOSPITAL_COMMUNITY)
Admission: RE | Admit: 2012-03-27 | Discharge: 2012-03-27 | Disposition: A | Discharge status: Home / Self Care | Payer: Medicare Other | Source: Ambulatory Visit | Attending: Internal Medicine | Admitting: Internal Medicine

## 2012-03-27 DIAGNOSIS — R05 Cough: Secondary | ICD-10-CM | POA: Insufficient documentation

## 2012-03-27 DIAGNOSIS — I10 Essential (primary) hypertension: Secondary | ICD-10-CM

## 2012-03-27 DIAGNOSIS — R059 Cough, unspecified: Secondary | ICD-10-CM | POA: Insufficient documentation

## 2012-03-27 DIAGNOSIS — Z87891 Personal history of nicotine dependence: Secondary | ICD-10-CM | POA: Insufficient documentation

## 2012-03-27 DIAGNOSIS — J984 Other disorders of lung: Secondary | ICD-10-CM | POA: Insufficient documentation

## 2012-03-27 DIAGNOSIS — J438 Other emphysema: Secondary | ICD-10-CM | POA: Insufficient documentation

## 2012-03-27 MED ORDER — IOHEXOL 300 MG/ML  SOLN
80.0000 mL | Freq: Once | INTRAMUSCULAR | Status: AC | PRN
  Administered 2012-03-27: 80 mL via INTRAVENOUS

## 2012-03-27 NOTE — Progress Notes (Signed)
Pt here for CT, bmet needed to be obtained prior to procedure. Order placed.Stephanie Spittle Cassady6/25/20139:43 AM

## 2012-04-11 ENCOUNTER — Other Ambulatory Visit: Payer: Self-pay | Admitting: *Deleted

## 2012-04-11 MED ORDER — GLUCOSE BLOOD VI STRP: ORAL_STRIP | Status: DC

## 2012-04-11 NOTE — Telephone Encounter (Signed)
Fax from [facility] - requesting refill One Touch Ultra Test Strips

## 2012-04-23 ENCOUNTER — Encounter: Payer: Medicare Other | Admitting: Cardiovascular Disease

## 2012-05-02 ENCOUNTER — Ambulatory Visit (INDEPENDENT_AMBULATORY_CARE_PROVIDER_SITE_OTHER): Payer: Medicare Other | Admitting: Internal Medicine

## 2012-05-02 ENCOUNTER — Encounter: Payer: Self-pay | Admitting: Internal Medicine

## 2012-05-02 VITALS — BP 144/72 | HR 77 | Temp 98.2°F | Ht 70.5 in | Wt 212.0 lb

## 2012-05-02 DIAGNOSIS — M329 Systemic lupus erythematosus, unspecified: Secondary | ICD-10-CM

## 2012-05-02 DIAGNOSIS — R609 Edema, unspecified: Secondary | ICD-10-CM

## 2012-05-02 DIAGNOSIS — R05 Cough: Secondary | ICD-10-CM

## 2012-05-02 DIAGNOSIS — I10 Essential (primary) hypertension: Secondary | ICD-10-CM

## 2012-05-02 DIAGNOSIS — E119 Type 2 diabetes mellitus without complications: Secondary | ICD-10-CM

## 2012-05-02 DIAGNOSIS — R059 Cough, unspecified: Secondary | ICD-10-CM

## 2012-05-02 DIAGNOSIS — G629 Polyneuropathy, unspecified: Secondary | ICD-10-CM

## 2012-05-02 DIAGNOSIS — R600 Localized edema: Secondary | ICD-10-CM

## 2012-05-02 DIAGNOSIS — M545 Low back pain, unspecified: Secondary | ICD-10-CM

## 2012-05-02 LAB — CBC
HCT: 38.8 % (ref 36.0–46.0)
MCHC: 33.8 g/dL (ref 30.0–36.0)
MCV: 90 fL (ref 78.0–100.0)
RDW: 14.2 % (ref 11.5–15.5)

## 2012-05-02 MED ORDER — LIDOCAINE 5 % EX PTCH: 1.0000 | MEDICATED_PATCH | Freq: Two times a day (BID) | CUTANEOUS | Status: DC

## 2012-05-02 MED ORDER — HYDROCODONE-ACETAMINOPHEN 5-500 MG PO TABS: 1.0000 | ORAL_TABLET | Freq: Four times a day (QID) | ORAL | Status: DC | PRN

## 2012-05-02 MED ORDER — LOSARTAN POTASSIUM 50 MG PO TABS: 50.0000 mg | ORAL_TABLET | Freq: Every day | ORAL | Status: DC

## 2012-05-02 NOTE — Assessment & Plan Note (Signed)
Lab Results  Component Value Date   NA 145 02/22/2012   K 3.7 02/22/2012   CL 109 02/22/2012   CO2 28 02/22/2012   BUN 13 02/22/2012   CREATININE 0.79 02/22/2012   CREATININE 0.62 07/12/2011    BP Readings from Last 3 Encounters:  05/02/12 144/72  03/13/12 140/80  02/22/12 135/80    Assessment: Hypertension control:  mildly elevated  Progress toward goals:  unchanged Barriers to meeting goals:  no barriers identified  Plan: Hypertension treatment:  continue current medications Will add losartan 50 mg daily for renal protection.

## 2012-05-02 NOTE — Progress Notes (Signed)
Subjective:   Patient ID: Stephanie Horne female   DOB: 1941/04/26 71 y.o.   MRN: [id number]  HPI: Stephanie Horne is a 71 y.o.  Woman with history of hypertension, lupus, atrial fibrillation, and diabetes who presents today for the results of the CT chest that she had in June, which revealed a stable pulmonary nodule.  Initially she had no complaints, but eventually we discussed her back pain, which has been secondary to her tailbone injury years ago. She has been treated with Vicodin, and reports that she takes about one Vicodin a day. Her daughter notes though that she has been using a wheelchair at home more frequently, which suggests to her that her mother is in more pain.  Both the patient and her daughter request a Lidoderm patch.  Patient also notes recent bilateral peripheral numbness and tingling. She reports that the numbness and tingling has been occurring for the last 2-3 weeks, and she notes increased swelling for the last week. She usually wears compression stockings at home, but has been wearing them less frequently during the summer time. Also she notes that her compression stockings are not as tight as they once were. She is an appointment to see her cardiologist on 05/26/2012. No urinary complaints. No bowel or bladder incontinence. No change in back pain.  She also reports that lisinopril is discontinued at her last visit because of a chronic cough, and her cough has since resolved.  Mood recently improved, daughter agrees.   Current Outpatient Prescriptions  Medication Sig Dispense Refill  . alendronate (FOSAMAX) 70 MG tablet Take 1 tablet (70 mg total) by mouth every 7 (seven) days. Take with a full glass of water on an empty stomach.  12 tablet  3  . aspirin 81 MG EC tablet Take 81 mg by mouth daily.        . Blood Glucose Monitoring Suppl (ONE TOUCH ULTRA 2) W/DEVICE KIT Use to test blood sugars  1 each  0  . Calcium Carbonate-Vitamin D (CALTRATE 600+D) 600-400 MG-UNIT  per chew tablet Chew 1 tablet by mouth 2 (two) times daily.  30 tablet  6  . diclofenac sodium (VOLTAREN) 1 % GEL Apply 1 application topically 4 (four) times daily.  1 Tube  3  . fish oil-omega-3 fatty acids 1000 MG capsule Take 1 capsule (1 g total) by mouth daily.  30 capsule  11  . folic acid (FOLVITE) 1 MG tablet Take 1 tablet (1 mg total) by mouth daily.  90 tablet  6  . furosemide (LASIX) 40 MG tablet Take 1 tablet (40 mg total) by mouth daily.  30 tablet  11  . glipiZIDE (GLUCOTROL) 5 MG tablet Take 1 tablet (5 mg total) by mouth daily.  30 tablet  5  . glucose blood test strip Check blood sugar daily.  100 each  10  . metoprolol (LOPRESSOR) 100 MG tablet TAKE 1 TABLET (100 MG TOTAL) BY MOUTH 2 (TWO) TIMES DAILY.  60 tablet  6  . nitroGLYCERIN (NITROSTAT) 0.4 MG SL tablet Place 1 tablet (0.4 mg total) under the tongue every 5 (five) minutes as needed.  90 tablet  0  . omeprazole (PRILOSEC) 40 MG capsule TAKE 1 CAPSULE (40 MG TOTAL) BY MOUTH DAILY.  30 capsule  3  . pravastatin (PRAVACHOL) 40 MG tablet Take 1 tablet (40 mg total) by mouth daily.  30 tablet  5  . predniSONE (DELTASONE) 10 MG tablet Take 1 tablet (10 mg total) by mouth  daily.  31 tablet  11  . sertraline (ZOLOFT) 25 MG tablet Take 1 tablet (25 mg total) by mouth daily.  30 tablet  2  . HYDROcodone-acetaminophen (VICODIN) 5-500 MG per tablet Take 1 tablet by mouth every 6 (six) hours as needed for pain.  90 tablet  0  . lidocaine (LIDODERM) 5 % Place 1 patch onto the skin every 12 (twelve) hours. Remove & Discard patch within 12 hours or as directed by MD  60 patch  0  . losartan (COZAAR) 50 MG tablet Take 1 tablet (50 mg total) by mouth daily.  30 tablet  11   Review of Systems: Constitutional: Denies fever, chills, diaphoresis, appetite change and fatigue.  HEENT: Denies photophobia, eye pain, redness, hearing loss, ear pain, congestion, sore throat, rhinorrhea, sneezing, mouth sores, trouble swallowing, neck pain, neck  stiffness and tinnitus.   Respiratory: Denies SOB, DOE, cough, chest tightness,  and wheezing.   Cardiovascular: Denies chest pain, palpitations Gastrointestinal: Denies nausea, vomiting, abdominal pain, diarrhea, constipation, blood in stool and abdominal distention.  Genitourinary: Denies dysuria, urgency, frequency, hematuria, flank pain and difficulty urinating.  Skin: Denies pallor, rash and wound.  Neurological: Denies dizziness, seizures, syncope, weakness, light-headedness, numbness and headaches.  Psychiatric/Behavioral: Denies suicidal ideation, mood changes, confusion, nervousness, sleep disturbance and agitation  Objective:  Physical Exam: Filed Vitals:   05/02/12 1451  BP: 144/72  Pulse: 77  Temp: 98.2 F (36.8 C)  TempSrc: Oral  Height: 5' 10.5" (1.791 m)  Weight: 212 lb (96.163 kg)   Constitutional: Vital signs reviewed.  Patient is a well-developed and well-nourished woman in no acute distress and cooperative with exam. Eyes: PERRL, EOMI, conjunctivae normal, No scleral icterus.  Cardiovascular: RRR, S1 normal, S2 normal, no MRG, pulses symmetric and intact bilaterally Pulmonary/Chest: CTAB, no wheezes, rales, or rhonchi Abdominal: Soft. Non-tender, non-distended, bowel sounds are normal, no masses, organomegaly, or guarding present.  Musculoskeletal: Bilateral lower extremity 1+ edema  Neurological: A&O x3, Strength is normal and symmetric bilaterally, cranial nerve II-XII are grossly intact, no focal motor deficit, sensory intact to light touch bilaterally.  Skin: Warm, dry and intact. No rash, cyanosis, or clubbing.  Psychiatric: Normal mood and affect. speech and behavior is normal. Judgment and thought content normal. Cognition and memory are normal.   Assessment & Plan:  Case and care discussed with Dr. Meredith Pel. Patient to return in 2 months for a diabetes followup. Please see problem-oriented charting for further details.

## 2012-05-02 NOTE — Assessment & Plan Note (Signed)
No recent evidence of heart failure or renal failure. Patient to visit with cardiologist in August. Recent worsening of edema may be due to noncompliance with compression stockings during the summertime. Continue Lasix 40 mg for now. Reorder TED hose. We'll continue to follow.

## 2012-05-02 NOTE — Patient Instructions (Signed)
-  I have sent in a prescription for the lidoderm patch.  I have also printed a script for your vicodin.  -I will call you if needed with your lab work results  -Please start Losartan 50mg  daily for your blood pressure and to protect your kidneys.  Please be sure to bring all of your medications with you to every visit.  Should you have any new or worsening symptoms, please be sure to call the clinic at [phone].

## 2012-05-02 NOTE — Assessment & Plan Note (Signed)
Failure of tramadol. Patient was started on Vicodin in March 2013. 90 tablets lasted her up until last week. We will refill Vicodin at this time. I will also write for a Lidoderm patch, but patient understands that this might not be covered by her insurance.

## 2012-05-02 NOTE — Assessment & Plan Note (Signed)
Cough has resolved with discontinuation of ACE inhibitor. CT chest was done given history of patient being a former smoker. He was also done to rule out hiatal hernia or mass. CT chest was only significant for a stable 3.8 mm pulmonary nodule since chest CT done in 2009

## 2012-05-03 ENCOUNTER — Other Ambulatory Visit: Payer: Self-pay | Admitting: Internal Medicine

## 2012-05-29 ENCOUNTER — Encounter: Payer: Medicare Other | Admitting: Cardiovascular Disease

## 2012-05-30 ENCOUNTER — Other Ambulatory Visit: Payer: Self-pay | Admitting: Internal Medicine

## 2012-06-27 ENCOUNTER — Encounter: Payer: Self-pay | Admitting: Internal Medicine

## 2012-06-27 ENCOUNTER — Ambulatory Visit (INDEPENDENT_AMBULATORY_CARE_PROVIDER_SITE_OTHER): Payer: Medicare Other | Admitting: Internal Medicine

## 2012-06-27 VITALS — BP 128/77 | HR 78 | Temp 98.1°F | Wt 208.6 lb

## 2012-06-27 DIAGNOSIS — Z23 Encounter for immunization: Secondary | ICD-10-CM

## 2012-06-27 DIAGNOSIS — Z0000 Encounter for general adult medical examination without abnormal findings: Secondary | ICD-10-CM

## 2012-06-27 DIAGNOSIS — I4891 Unspecified atrial fibrillation: Secondary | ICD-10-CM

## 2012-06-27 DIAGNOSIS — M8580 Other specified disorders of bone density and structure, unspecified site: Secondary | ICD-10-CM

## 2012-06-27 DIAGNOSIS — E559 Vitamin D deficiency, unspecified: Secondary | ICD-10-CM

## 2012-06-27 DIAGNOSIS — F329 Major depressive disorder, single episode, unspecified: Secondary | ICD-10-CM

## 2012-06-27 DIAGNOSIS — E785 Hyperlipidemia, unspecified: Secondary | ICD-10-CM

## 2012-06-27 DIAGNOSIS — E119 Type 2 diabetes mellitus without complications: Secondary | ICD-10-CM

## 2012-06-27 DIAGNOSIS — I10 Essential (primary) hypertension: Secondary | ICD-10-CM

## 2012-06-27 DIAGNOSIS — Z79899 Other long term (current) drug therapy: Secondary | ICD-10-CM

## 2012-06-27 LAB — COMPREHENSIVE METABOLIC PANEL
Albumin: 4.2 g/dL (ref 3.5–5.2)
BUN: 10 mg/dL (ref 6–23)
Calcium: 9.5 mg/dL (ref 8.4–10.5)
Chloride: 109 mEq/L (ref 96–112)
Glucose, Bld: 65 mg/dL — ABNORMAL LOW (ref 70–99)
Potassium: 3.8 mEq/L (ref 3.5–5.3)
Sodium: 144 mEq/L (ref 135–145)
Total Protein: 7.3 g/dL (ref 6.0–8.3)

## 2012-06-27 LAB — LIPID PANEL
HDL: 46 mg/dL (ref >39)
Triglycerides: 141 mg/dL (ref <150)

## 2012-06-27 MED ORDER — ALENDRONATE SODIUM 70 MG PO TABS: 70.0000 mg | ORAL_TABLET | ORAL | Status: DC

## 2012-06-27 MED ORDER — SERTRALINE HCL 25 MG PO TABS: 25.0000 mg | ORAL_TABLET | Freq: Every day | ORAL | Status: DC

## 2012-06-27 MED ORDER — CALCIUM CARBONATE-VITAMIN D 600-400 MG-UNIT PO CHEW: 1.0000 | CHEWABLE_TABLET | Freq: Two times a day (BID) | ORAL | Status: DC

## 2012-06-27 NOTE — Patient Instructions (Signed)
-  Continue taking all of your medications - be sure to restart Caltrate (Vit D and Calcium supplements) - this is especially important for your bone health since you are on prednisone.  Please be sure to bring all of your medications & your glucometer with you to every visit.  Should you have any new or worsening symptoms, please be sure to call the clinic at [phone].

## 2012-06-27 NOTE — Progress Notes (Signed)
Subjective:   Patient ID: Stephanie Horne female   DOB: 02-22-1941 71 y.o.   MRN: [id number]  HPI: [name] is a 87 y.o. woman with history of hypertension, lupus, atrial fibrillation, and diabetes.  I last saw patient in clinic on 05/02/12 for routine follow up.  She has no complaints today.  She takes all of her medications except caltrate. Mood has been good.  She lives alone.  Able to cook for self.    Past Medical History  Diagnosis Date  . Hyperlipidemia   . Hypertension   . Back pain, chronic   . Lupus     Treated with prednisone indefinitely   . Seasonal allergies   . Mitral regurgitation   . Atrial fibrillation     not on coumidin because AIHA and major bleeding complications  . Axillary lymphadenopathy     left negative MMG 1/08  . AIHA (autoimmune hemolytic anemia)     12/08 onset  . Diabetes mellitus    Current Outpatient Prescriptions  Medication Sig Dispense Refill  . alendronate (FOSAMAX) 70 MG tablet Take 1 tablet (70 mg total) by mouth every 7 (seven) days. Take with a full glass of water on an empty stomach.  12 tablet  3  . aspirin 81 MG EC tablet Take 81 mg by mouth daily.        . Blood Glucose Monitoring Suppl (ONE TOUCH ULTRA 2) W/DEVICE KIT Use to test blood sugars  1 each  0  . Calcium Carbonate-Vitamin D (CALTRATE 600+D) 600-400 MG-UNIT per chew tablet Chew 1 tablet by mouth 2 (two) times daily.  30 tablet  6  . diclofenac sodium (VOLTAREN) 1 % GEL Apply 1 application topically 4 (four) times daily.  1 Tube  3  . folic acid (FOLVITE) 1 MG tablet Take 1 tablet (1 mg total) by mouth daily.  90 tablet  6  . furosemide (LASIX) 40 MG tablet Take 1 tablet (40 mg total) by mouth daily.  30 tablet  11  . glipiZIDE (GLUCOTROL) 5 MG tablet TAKE 1 TABLET (5 MG TOTAL) BY MOUTH DAILY.  30 tablet  5  . glucose blood test strip Check blood sugar daily.  100 each  10  . HYDROcodone-acetaminophen (VICODIN) 5-500 MG per tablet Take 1 tablet by mouth every 6  (six) hours as needed for pain.  90 tablet  0  . lidocaine (LIDODERM) 5 % Place 1 patch onto the skin every 12 (twelve) hours. Remove & Discard patch within 12 hours or as directed by MD  60 patch  0  . losartan (COZAAR) 50 MG tablet Take 1 tablet (50 mg total) by mouth daily.  30 tablet  11  . metoprolol (LOPRESSOR) 100 MG tablet TAKE 1 TABLET (100 MG TOTAL) BY MOUTH 2 (TWO) TIMES DAILY.  60 tablet  6  . nitroGLYCERIN (NITROSTAT) 0.4 MG SL tablet Place 1 tablet (0.4 mg total) under the tongue every 5 (five) minutes as needed.  90 tablet  0  . Omega-3 Fatty Acids ([facility] NATURAL FISH OIL) 1000 MG CAPS TAKE 1 CAPSULE (1 G TOTAL) BY MOUTH DAILY.  30 capsule  11  . omeprazole (PRILOSEC) 40 MG capsule TAKE 1 CAPSULE (40 MG TOTAL) BY MOUTH DAILY.  30 capsule  3  . pravastatin (PRAVACHOL) 40 MG tablet TAKE 1 TABLET (40 MG TOTAL) BY MOUTH DAILY.  30 tablet  5  . predniSONE (DELTASONE) 10 MG tablet Take 1 tablet (10 mg total) by mouth daily.  31 tablet  11  . sertraline (ZOLOFT) 25 MG tablet Take 1 tablet (25 mg total) by mouth daily.  30 tablet  2   Family History  Problem Relation Age of Onset  . Heart disease Mother    History   Social History  . Marital Status: Married    Spouse Name: N/A    Number of Children: N/A  . Years of Education: N/A   Social History Main Topics  . Smoking status: Former Games developer  . Smokeless tobacco: Not on file  . Alcohol Use: No  . Drug Use: No  . Sexually Active: Not on file     married, retired Engineer, materials, no smoking, alochol, illegal drug, her husband was clinic patient and died of lung cancer   Other Topics Concern  . Not on file   Social History Narrative  . No narrative on file   Review of Systems: Constitutional: Denies fever, chills, diaphoresis, appetite change and fatigue.  HEENT: Denies photophobia, eye pain, redness, hearing loss, ear pain, congestion, sore throat, rhinorrhea, sneezing, mouth sores, trouble swallowing, neck pain, neck  stiffness and tinnitus.   Respiratory: Denies SOB, DOE, cough, chest tightness,  and wheezing.   Cardiovascular: Denies chest pain, palpitations and leg swelling.  Gastrointestinal: Denies nausea, vomiting, abdominal pain, diarrhea, constipation, blood in stool and abdominal distention.  Genitourinary: Denies dysuria, urgency, frequency, hematuria, flank pain and difficulty urinating.  Musculoskeletal: Chronic low back pain from tail bone injury years ago. Manages with voltaren gel, does not like to use vicodin Skin: Denies pallor, rash and wound.  Neurological: Denies dizziness, seizures, syncope, weakness, light-headedness, numbness and headaches.  Psychiatric/Behavioral: Denies suicidal ideation, mood changes, confusion, nervousness, sleep disturbance and agitation  Objective:  Physical Exam: Filed Vitals:   06/27/12 1404  BP: 128/77  Pulse: 78  Temp: 98.1 F (36.7 C)  TempSrc: Oral  Weight: 208 lb 9.6 oz (94.62 kg)   Constitutional: Vital signs reviewed.  Patient is a well-developed and well-nourished woman in no acute distress and cooperative with exam.  Mouth: no erythema or exudates, MMM Eyes: PERRL, EOMI, conjunctivae normal, No scleral icterus.  Neck: Supple, Trachea midline normal ROM, No JVD, mass, thyromegaly, or carotid bruit present.  Cardiovascular: RRR, S1 normal, S2 normal, no MRG, pulses symmetric and intact bilaterally Pulmonary/Chest: CTAB, no wheezes, rales, or rhonchi Abdominal: Soft. Non-tender, non-distended, bowel sounds are normal, no masses, organomegaly, or guarding present.   Neurological: A&O x3 Skin: Warm, dry and intact. No rash, cyanosis, or clubbing.  Psychiatric: Normal mood and affect. speech and behavior is normal. Judgment and thought content normal. Cognition and memory are normal.   Assessment & Plan:   Case and care discussed with Dr. Kem Kays. Please see problem oriented charting for further details. Patient to return in 3 months for routine  follow up regarding DM.

## 2012-06-28 ENCOUNTER — Encounter: Payer: Self-pay | Admitting: Internal Medicine

## 2012-06-28 LAB — MICROALBUMIN / CREATININE URINE RATIO
Microalb Creat Ratio: 11.3 mg/g (ref 0.0–30.0)
Microalb, Ur: 0.5 mg/dL (ref 0.00–1.89)

## 2012-06-28 NOTE — Assessment & Plan Note (Signed)
Lab Results  Component Value Date   NA 144 06/27/2012   K 3.8 06/27/2012   CL 109 06/27/2012   CO2 26 06/27/2012   BUN 10 06/27/2012   CREATININE 0.93 06/27/2012   CREATININE 0.62 07/12/2011    BP Readings from Last 3 Encounters:  06/27/12 128/77  05/02/12 144/72  03/13/12 140/80    Assessment: Hypertension control:  controlled  Progress toward goals:  at goal Barriers to meeting goals:  no barriers identified  Plan: Hypertension treatment:  continue current medications - Lasix 40, losartan 50, metoprolol 100 bid

## 2012-06-28 NOTE — Assessment & Plan Note (Signed)
Again, has not been taking caltrate, but reinforced importance, especially in setting of osteopenia & chronic prednisone use.

## 2012-06-28 NOTE — Assessment & Plan Note (Signed)
Lab Results  Component Value Date   HGBA1C 7.3 06/27/2012   HGBA1C 6.6 11/24/2009   CREATININE 0.93 06/27/2012   CREATININE 0.62 07/12/2011   MICROALBUR 0.50 06/27/2012   MICRALBCREAT 11.3 06/27/2012   CHOL 158 06/27/2012   HDL 46 06/27/2012   TRIG 141 06/27/2012    Last eye exam and foot exam:    Component Value Date/Time   HMDIABEYEEXA negative 05/25/2009    Assessment: Diabetes control: controlled Progress toward goals: at goal Barriers to meeting goals: no barriers identified  Plan: Diabetes treatment: continue current medications Refer to: none Instruction/counseling given: reminded to bring blood glucose meter & log to each visit and reminded to bring medications to each visit  Lipid panel & urine microalb today.

## 2012-07-04 ENCOUNTER — Ambulatory Visit (INDEPENDENT_AMBULATORY_CARE_PROVIDER_SITE_OTHER): Payer: Medicare Other | Admitting: Cardiovascular Disease

## 2012-07-04 ENCOUNTER — Encounter: Payer: Self-pay | Admitting: Cardiovascular Disease

## 2012-07-04 ENCOUNTER — Other Ambulatory Visit: Payer: Self-pay | Admitting: Internal Medicine

## 2012-07-04 VITALS — BP 148/90 | HR 74 | Ht 70.0 in | Wt 218.0 lb

## 2012-07-04 DIAGNOSIS — I4891 Unspecified atrial fibrillation: Secondary | ICD-10-CM

## 2012-07-04 DIAGNOSIS — I10 Essential (primary) hypertension: Secondary | ICD-10-CM

## 2012-07-04 DIAGNOSIS — I060 Rheumatic aortic stenosis: Secondary | ICD-10-CM

## 2012-07-04 MED ORDER — LOSARTAN POTASSIUM 100 MG PO TABS: 100.0000 mg | ORAL_TABLET | Freq: Every day | ORAL | Status: DC

## 2012-07-04 NOTE — Progress Notes (Signed)
HPI:  71 year old woman presenting to reestablish cardiac care. She has been followed for permanent atrial fibrillation. Her last office visit was in 2008-03-29. At that time there were major issues related to noncompliance with warfarin monitoring. It appears she's been switched to aspirin in the interim. She also is followed for mitral regurgitation. Her last echocardiogram was in 03/29/2008 and this demonstrated normal left ventricular systolic function with an ejection fraction of 60%. The mitral valve was myxomatous with holosystolic mitral valve prolapse and moderate mitral regurgitation.  The patient reports no new cardiovascular symptoms. Her husband was also followed in my practice, and he passed away in 2011-03-30. She doesn't get out as much since his passing. She ambulates with a walker, lives independently, and still drives a car.  From a cardiovascular perspective, she denies chest pain or pressure, palpitations, or shortness of breath with exertion. She's had some leg swelling but this is improved with compression stockings. She denies orthopnea, PND, lightheadedness, or syncope.  She's had some rectal bleeding in the past but none recently. She has required blood transfusions, last in 30-Mar-2011. She has autoimmune hemolytic anemia.  Outpatient Encounter Prescriptions as of 07/04/2012  Medication Sig Dispense Refill  . alendronate (FOSAMAX) 70 MG tablet Take 1 tablet (70 mg total) by mouth every 7 (seven) days. Take with a full glass of water on an empty stomach.  12 tablet  3  . aspirin 81 MG EC tablet Take 81 mg by mouth daily.        . Blood Glucose Monitoring Suppl (ONE TOUCH ULTRA 2) W/DEVICE KIT Use to test blood sugars  1 each  0  . Calcium Carbonate-Vitamin D (CALTRATE 600+D) 600-400 MG-UNIT per chew tablet Chew 1 tablet by mouth 2 (two) times daily.  60 tablet  6  . diclofenac sodium (VOLTAREN) 1 % GEL Apply 1 application topically 4 (four) times daily.  1 Tube  3  . folic acid (FOLVITE) 1 MG  tablet Take 1 tablet (1 mg total) by mouth daily.  90 tablet  6  . furosemide (LASIX) 40 MG tablet Take 1 tablet (40 mg total) by mouth daily.  30 tablet  11  . glipiZIDE (GLUCOTROL) 5 MG tablet TAKE 1 TABLET (5 MG TOTAL) BY MOUTH DAILY.  30 tablet  5  . glucose blood test strip Check blood sugar daily.  100 each  10  . lidocaine (LIDODERM) 5 % Place 1 patch onto the skin every 12 (twelve) hours. Remove & Discard patch within 12 hours or as directed by MD  60 patch  0  . losartan (COZAAR) 100 MG tablet Take 1 tablet (100 mg total) by mouth daily.  30 tablet  11  . metoprolol (LOPRESSOR) 100 MG tablet TAKE 1 TABLET (100 MG TOTAL) BY MOUTH 2 (TWO) TIMES DAILY.  60 tablet  6  . nitroGLYCERIN (NITROSTAT) 0.4 MG SL tablet Place 1 tablet (0.4 mg total) under the tongue every 5 (five) minutes as needed.  90 tablet  0  . Omega-3 Fatty Acids ([facility] NATURAL FISH OIL) 1000 MG CAPS TAKE 1 CAPSULE (1 G TOTAL) BY MOUTH DAILY.  30 capsule  11  . omeprazole (PRILOSEC) 40 MG capsule TAKE 1 CAPSULE (40 MG TOTAL) BY MOUTH DAILY.  30 capsule  3  . pravastatin (PRAVACHOL) 40 MG tablet TAKE 1 TABLET (40 MG TOTAL) BY MOUTH DAILY.  30 tablet  5  . predniSONE (DELTASONE) 10 MG tablet Take 1 tablet (10 mg total) by mouth daily.  31 tablet  11  . sertraline (ZOLOFT) 25 MG tablet Take 1 tablet (25 mg total) by mouth daily.  30 tablet  2  . DISCONTD: losartan (COZAAR) 50 MG tablet Take 1 tablet (50 mg total) by mouth daily.  30 tablet  11  . DISCONTD: HYDROcodone-acetaminophen (VICODIN) 5-500 MG per tablet Take 1 tablet by mouth every 6 (six) hours as needed for pain.  90 tablet  0    Allergies  Allergen Reactions  . Metformin And Related     Diarrhea that was felt to be 2/2 metformin so trial off it was attempted.  . Lisinopril     cough    Past Medical History  Diagnosis Date  . Hyperlipidemia   . Hypertension   . Back pain, chronic   . Lupus     Treated with prednisone indefinitely   . Seasonal allergies   .  Mitral regurgitation   . Atrial fibrillation     not on coumidin because AIHA and major bleeding complications  . Axillary lymphadenopathy     left negative MMG 1/08  . AIHA (autoimmune hemolytic anemia)     12/08 onset  . Diabetes mellitus   . Complicated grief 03/19/2012    Death of husband in 2011/03/12   . MITRAL REGURGITATION, MODERATE 10/24/2006    2d Echo from 2008-03-11 - EF 60%, Moderated MR      ROS: Negative except as per HPI  BP 148/90  Pulse 74  Ht 5\' 10"  (1.778 m)  Wt 98.884 kg (218 lb)  BMI 31.28 kg/m2  PHYSICAL EXAM: Pt is alert and oriented, very pleasant, elderly woman in NAD HEENT: normal Neck: JVP - normal, carotids 2+= without bruits Lungs: CTA bilaterally CV: Irregularly irregular with a grade 2-3/6 holosystolic murmur best heard at the LV apex Abd: soft, NT, Positive BS, no hepatomegaly Ext: 1+ pretibial edema bilaterally, distal pulses intact and equal Skin: warm/dry no rash  EKG:  Atrial fibrillation heart rate 74 beats per minute, aberrantly conducted complexes versus PVCs noted.  ASSESSMENT AND PLAN: 1. Permanent atrial fibrillation. The patient appears asymptomatic. Her heart rate is well controlled. She's on aspirin for antiplatelet therapy. Her CHADS score = 2 (HTN/DM) the decision on anticoagulant therapy is not straightforward because of her bleeding history. I am going to check a 2-D echocardiogram for reassessment of LV function. For now I will not make any changes related to her atrial fibrillation. As above she was noncompliant with Coumadin in the past.  2. Mitral regurgitation. Exam is suggestive of at least moderate mitral regurgitation. Will check an echocardiogram for further evaluation. No signs or symptoms of congestive heart failure.  3. Hypertension with suboptimal control. Recommend increase losartan from 50-100 mg daily.  For followup, I will plan on seeing the patient back in 12 months. Will reconsider anti-coagulant therapy after reviewing  her echocardiogram.  Tonny Bollman 07/04/2012 12:28 PM

## 2012-07-04 NOTE — Patient Instructions (Addendum)
Your physician has recommended you make the following change in your medication:  1.) INCREASE LOSARTAN ONE TABLET (100 MG) DAILY.  I SENT SCRIPT INTO YOUR PHARMACY   Your physician has requested that you have an echocardiogram. DX:  A FIB, MITRAL REGURGITATION chocardiography is a painless test that uses sound waves to create images of your heart. It provides your doctor with information about the size and shape of your heart and how well your heart's chambers and valves are working. This procedure takes approximately one hour. There are no restrictions for this procedure.   Your physician wants you to follow-up in: ONE YEAR WITH DR. Theodoro Parma will receive a reminder letter in the mail two months in advance. If you don't receive a letter, please call our office to schedule the follow-up appointment.

## 2012-07-10 ENCOUNTER — Ambulatory Visit (HOSPITAL_COMMUNITY): Payer: Medicare Other | Attending: Cardiology | Admitting: Radiology

## 2012-07-10 ENCOUNTER — Other Ambulatory Visit: Payer: Self-pay

## 2012-07-10 DIAGNOSIS — M329 Systemic lupus erythematosus, unspecified: Secondary | ICD-10-CM | POA: Insufficient documentation

## 2012-07-10 DIAGNOSIS — I4891 Unspecified atrial fibrillation: Secondary | ICD-10-CM | POA: Insufficient documentation

## 2012-07-10 DIAGNOSIS — I10 Essential (primary) hypertension: Secondary | ICD-10-CM | POA: Insufficient documentation

## 2012-07-10 DIAGNOSIS — I059 Rheumatic mitral valve disease, unspecified: Secondary | ICD-10-CM

## 2012-07-10 DIAGNOSIS — R011 Cardiac murmur, unspecified: Secondary | ICD-10-CM

## 2012-07-10 DIAGNOSIS — I319 Disease of pericardium, unspecified: Secondary | ICD-10-CM | POA: Insufficient documentation

## 2012-07-10 DIAGNOSIS — E119 Type 2 diabetes mellitus without complications: Secondary | ICD-10-CM | POA: Insufficient documentation

## 2012-07-10 DIAGNOSIS — I060 Rheumatic aortic stenosis: Secondary | ICD-10-CM

## 2012-07-10 DIAGNOSIS — I369 Nonrheumatic tricuspid valve disorder, unspecified: Secondary | ICD-10-CM | POA: Insufficient documentation

## 2012-07-10 NOTE — Progress Notes (Signed)
Echocardiogram performed.  

## 2012-07-12 ENCOUNTER — Encounter: Payer: Self-pay | Admitting: Internal Medicine

## 2012-07-13 ENCOUNTER — Encounter: Payer: Self-pay | Admitting: Cardiovascular Disease

## 2012-07-13 NOTE — Telephone Encounter (Signed)
Patient returning nurse call, she can be reached at hM#  °

## 2012-07-13 NOTE — Telephone Encounter (Signed)
This encounter was created in error - please disregard.

## 2012-07-31 ENCOUNTER — Telehealth: Payer: Self-pay | Admitting: *Deleted

## 2012-07-31 DIAGNOSIS — R609 Edema, unspecified: Secondary | ICD-10-CM

## 2012-07-31 DIAGNOSIS — M329 Systemic lupus erythematosus, unspecified: Secondary | ICD-10-CM

## 2012-07-31 DIAGNOSIS — M545 Low back pain: Secondary | ICD-10-CM

## 2012-08-02 ENCOUNTER — Encounter: Payer: Medicare Other | Admitting: Internal Medicine

## 2012-08-02 NOTE — Telephone Encounter (Signed)
Pt was here with another patient and stated that her rollator walker wheels were coming apart.  Pt asked for a new rx, discussed DrSharda, rx given to pt to take to advance for a new walker.Stephanie Spittle Cassady10/31/201311:54 AM

## 2012-09-04 ENCOUNTER — Other Ambulatory Visit: Payer: Self-pay | Admitting: Internal Medicine

## 2012-09-07 ENCOUNTER — Telehealth: Payer: Self-pay | Admitting: *Deleted

## 2012-09-07 NOTE — Telephone Encounter (Signed)
Pt/daughter had spoken to Debbie,RN and Chilon.  Stated pt felled 2 days ago and was not found x 8 hrs. Pt has bruises on face/arms. No available appts todays; pt refused to go to the ED per Chilon. Stated she will go to Urgent Care but not until tomorrow; Chilon stated She will call her Monday to see if she went.

## 2012-09-08 ENCOUNTER — Emergency Department (HOSPITAL_COMMUNITY): Payer: Medicare Other

## 2012-09-08 ENCOUNTER — Encounter (HOSPITAL_COMMUNITY): Payer: Self-pay | Admitting: *Deleted

## 2012-09-08 ENCOUNTER — Emergency Department (HOSPITAL_COMMUNITY)
Admission: EM | Admit: 2012-09-08 | Discharge: 2012-09-08 | Disposition: A | Discharge status: Home / Self Care | Payer: Medicare Other | Attending: Emergency Medicine | Admitting: Emergency Medicine

## 2012-09-08 ENCOUNTER — Emergency Department (HOSPITAL_COMMUNITY)
Admission: EM | Admit: 2012-09-08 | Discharge: 2012-09-08 | Disposition: A | Discharge status: Nursing Home (Includes ICF) | Payer: Medicare Other | Source: Home / Self Care | Attending: Family Medicine | Admitting: Family Medicine

## 2012-09-08 DIAGNOSIS — I10 Essential (primary) hypertension: Secondary | ICD-10-CM | POA: Insufficient documentation

## 2012-09-08 DIAGNOSIS — Y92009 Unspecified place in unspecified non-institutional (private) residence as the place of occurrence of the external cause: Secondary | ICD-10-CM | POA: Insufficient documentation

## 2012-09-08 DIAGNOSIS — Z862 Personal history of diseases of the blood and blood-forming organs and certain disorders involving the immune mechanism: Secondary | ICD-10-CM | POA: Insufficient documentation

## 2012-09-08 DIAGNOSIS — E119 Type 2 diabetes mellitus without complications: Secondary | ICD-10-CM | POA: Insufficient documentation

## 2012-09-08 DIAGNOSIS — M329 Systemic lupus erythematosus, unspecified: Secondary | ICD-10-CM | POA: Insufficient documentation

## 2012-09-08 DIAGNOSIS — S0990XA Unspecified injury of head, initial encounter: Secondary | ICD-10-CM | POA: Insufficient documentation

## 2012-09-08 DIAGNOSIS — Z87891 Personal history of nicotine dependence: Secondary | ICD-10-CM | POA: Insufficient documentation

## 2012-09-08 DIAGNOSIS — W06XXXA Fall from bed, initial encounter: Secondary | ICD-10-CM | POA: Insufficient documentation

## 2012-09-08 DIAGNOSIS — Z7982 Long term (current) use of aspirin: Secondary | ICD-10-CM | POA: Insufficient documentation

## 2012-09-08 DIAGNOSIS — E785 Hyperlipidemia, unspecified: Secondary | ICD-10-CM | POA: Insufficient documentation

## 2012-09-08 DIAGNOSIS — Z8739 Personal history of other diseases of the musculoskeletal system and connective tissue: Secondary | ICD-10-CM | POA: Insufficient documentation

## 2012-09-08 DIAGNOSIS — Z79899 Other long term (current) drug therapy: Secondary | ICD-10-CM | POA: Insufficient documentation

## 2012-09-08 DIAGNOSIS — W19XXXA Unspecified fall, initial encounter: Secondary | ICD-10-CM

## 2012-09-08 DIAGNOSIS — I4891 Unspecified atrial fibrillation: Secondary | ICD-10-CM

## 2012-09-08 DIAGNOSIS — Z8679 Personal history of other diseases of the circulatory system: Secondary | ICD-10-CM | POA: Insufficient documentation

## 2012-09-08 DIAGNOSIS — Z8659 Personal history of other mental and behavioral disorders: Secondary | ICD-10-CM | POA: Insufficient documentation

## 2012-09-08 DIAGNOSIS — IMO0002 Reserved for concepts with insufficient information to code with codable children: Secondary | ICD-10-CM | POA: Insufficient documentation

## 2012-09-08 DIAGNOSIS — J309 Allergic rhinitis, unspecified: Secondary | ICD-10-CM | POA: Insufficient documentation

## 2012-09-08 DIAGNOSIS — Y9389 Activity, other specified: Secondary | ICD-10-CM | POA: Insufficient documentation

## 2012-09-08 NOTE — ED Notes (Signed)
Pt sent from [facility] for a fall on Thurs. She rolled out of bed while sleeping and laid on the floor from 0830-1530. Has a bruise to her face and abrasion to her left elbow. EKG completed at [facility] with no report of any changes.

## 2012-09-08 NOTE — ED Notes (Signed)
Patient transported to CT 

## 2012-09-08 NOTE — ED Provider Notes (Signed)
History     CSN: [id number]  Arrival date & time 09/08/12  1300-03-06   First MD Initiated Contact with Patient 09/08/12 1650      Chief Complaint  Patient presents with  . Fall    (Consider location/radiation/quality/duration/timing/severity/associated sxs/prior treatment) The history is provided by the patient.  Stephanie Horne is a 71 y.o. female hx of afib, MVR, HTN, DM here s/p fall. 2 days ago, she fell out of bed and landed on the floor. She didn't remember the incident. She also had a bruise on L elbow. She went to urgent care for evaluation, and was sent here for CT head. Denies headaches, vomiting and has been behaving normally.    Past Medical History  Diagnosis Date  . Hyperlipidemia   . Hypertension   . Back pain, chronic   . Lupus     Treated with prednisone indefinitely   . Seasonal allergies   . Mitral regurgitation   . Atrial fibrillation     not on coumidin because AIHA and major bleeding complications  . Axillary lymphadenopathy     left negative MMG 1/08  . AIHA (autoimmune hemolytic anemia)     12/08 onset  . Diabetes mellitus   . Complicated grief 03/17/12    Death of husband in 03/07/2011   . MITRAL REGURGITATION, MODERATE 10/24/2006    2d Echo from March 06, 2008 - EF 60%, Moderated MR      Past Surgical History  Procedure Date  . Eye surgery     cataract in both eyes    Family History  Problem Relation Age of Onset  . Heart disease Mother     History  Substance Use Topics  . Smoking status: Former Games developer  . Smokeless tobacco: Not on file  . Alcohol Use: No    OB History    Grav Para Term Preterm Abortions TAB SAB Ect Mult Living                  Review of Systems  Musculoskeletal:       L elbow pain   All other systems reviewed and are negative.    Allergies  Metformin and related and Lisinopril  Home Medications   Current Outpatient Rx  Name  Route  Sig  Dispense  Refill  . ALENDRONATE SODIUM 70 MG PO TABS   Oral   Take 70 mg by  mouth every 7 (seven) days. Take with a full glass of water on an empty stomach on Saturdays         . ASPIRIN 81 MG PO TBEC   Oral   Take 81 mg by mouth daily.           Marland Kitchen CALTRATE 600+D PO   Oral   Take 600 mg by mouth daily.         Marland Kitchen DICLOFENAC SODIUM 1 % TD GEL   Topical   Apply 1 application topically 2 (two) times daily as needed. For leg, left hip and foot pain         . FOLIC ACID 1 MG PO TABS   Oral   Take 1 tablet (1 mg total) by mouth daily.   90 tablet   6   . FUROSEMIDE 40 MG PO TABS   Oral   Take 1 tablet (40 mg total) by mouth daily.   30 tablet   11   . GLIPIZIDE 5 MG PO TABS   Oral   Take 5 mg by mouth  daily.         Marland Kitchen LOSARTAN POTASSIUM 100 MG PO TABS   Oral   Take 1 tablet (100 mg total) by mouth daily.   30 tablet   11   . METOPROLOL TARTRATE 100 MG PO TABS   Oral   Take 100 mg by mouth 2 (two) times daily.         Marland Kitchen NITROGLYCERIN 0.4 MG SL SUBL   Sublingual   Place 0.4 mg under the tongue every 5 (five) minutes as needed. For chest pain         . FISH OIL 1000 MG PO CAPS   Oral   Take 1,000 mg by mouth daily.         Marland Kitchen OMEPRAZOLE 40 MG PO CPDR   Oral   Take 40 mg by mouth daily.         Frazier Butt OP   Both Eyes   Place 1 drop into both eyes daily.         Marland Kitchen PRAVASTATIN SODIUM 40 MG PO TABS   Oral   Take 40 mg by mouth daily.         Marland Kitchen PREDNISONE 10 MG PO TABS   Oral   Take 1 tablet (10 mg total) by mouth daily.   31 tablet   11     BP 127/67  Pulse 70  Temp 97.7 F (36.5 C) (Oral)  Resp 18  SpO2 100%  Physical Exam  Nursing note and vitals reviewed. Constitutional: She is oriented to person, place, and time. She appears well-developed and well-nourished.  HENT:  Head: Normocephalic and atraumatic.  Mouth/Throat: Oropharynx is clear and moist.       No scalp hematoma   Eyes: Conjunctivae normal are normal. Pupils are equal, round, and reactive to light.  Neck: Normal range of motion. Neck  supple.  Cardiovascular: Normal rate, regular rhythm and normal heart sounds.   Pulmonary/Chest: Effort normal and breath sounds normal. No respiratory distress. She has no wheezes.  Abdominal: Soft. Bowel sounds are normal. She exhibits no distension. There is no tenderness. There is no rebound.  Musculoskeletal: Normal range of motion. She exhibits no edema and no tenderness.  Neurological: She is alert and oriented to person, place, and time.       nonfocal neuro exam  Skin: Skin is warm and dry.       Abrasion on L elbow, nl ROM of elbow.   Psychiatric: She has a normal mood and affect. Her behavior is normal. Judgment and thought content normal.    ED Course  Procedures (including critical care time)  Labs Reviewed  GLUCOSE, CAPILLARY - Abnormal; Notable for the following:    Glucose-Capillary 127 (*)     All other components within normal limits   Ct Head Wo Contrast  09/08/2012  *RADIOLOGY REPORT*  Clinical Data: Fall  CT HEAD WITHOUT CONTRAST  Technique:  Contiguous axial images were obtained from the base of the skull through the vertex without contrast.  Comparison: 03/02/2008  Findings: No skull fracture is noted.  Paranasal sinuses and mastoid air cells are unremarkable.  No intracranial hemorrhage, mass effect or midline shift.  Stable cerebral atrophy.  Stable periventricular chronic white matter disease.  No acute cortical infarction.  No mass lesion is noted on this unenhanced scan.  IMPRESSION:  1.  No acute intracranial abnormality.  Stable atrophy and chronic white matter disease.   Original Report Authenticated By: Natasha Mead, M.D.  No diagnosis found.    MDM  Stephanie Horne is a 72 y.o. female here s/p fall. CT head nl. Neuro exam nl. Will d/c home.         Richardean Canal, MD 09/08/12 1900

## 2012-09-08 NOTE — ED Provider Notes (Signed)
History     CSN: [id number]  Arrival date & time 09/08/12  1015   First MD Initiated Contact with Patient 09/08/12 1210      Chief Complaint  Patient presents with  . Fall    (Consider location/radiation/quality/duration/timing/severity/associated sxs/prior treatment) HPI Comments: 71 year old female with history of A. fib, mitral valve regurgitation, hypertension and diabetes among other comorbidities. Here with daughter concern about a recent fall 2 days ago. Patient was found in her bathroom floor apparently after falling overnight and was found in the afternoon the next day. When patient was found she was conscious and not incontinent. Patient thinks that she may have felt in her sleep. She is currently on no anticoagulation. She states her diabetes has been very well controlled and her sugar runs in the 115's when she checks randomly. Denies chest pain or shortness of breath. Patient has a bruising in the right lower side of her face and a small laceration in her left elbow as a result of her fall. Denies difficulty moving her arms or legs denies headache or blurry vision. Denies balance problems from baseline.   Past Medical History  Diagnosis Date  . Hyperlipidemia   . Hypertension   . Back pain, chronic   . Lupus     Treated with prednisone indefinitely   . Seasonal allergies   . Mitral regurgitation   . Atrial fibrillation     not on coumidin because AIHA and major bleeding complications  . Axillary lymphadenopathy     left negative MMG 1/08  . AIHA (autoimmune hemolytic anemia)     12/08 onset  . Diabetes mellitus   . Complicated grief 03/06/2012    Death of husband in Feb 24, 2011   . MITRAL REGURGITATION, MODERATE 10/24/2006    2d Echo from 02-24-2008 - EF 60%, Moderated MR      Past Surgical History  Procedure Date  . Eye surgery     cataract in both eyes    Family History  Problem Relation Age of Onset  . Heart disease Mother     History  Substance Use Topics  .  Smoking status: Former Games developer  . Smokeless tobacco: Not on file  . Alcohol Use: No    OB History    Grav Para Term Preterm Abortions TAB SAB Ect Mult Living                  Review of Systems  Constitutional: Negative for fever, chills, diaphoresis and fatigue.  HENT: Positive for facial swelling. Negative for neck pain.   Eyes: Negative for visual disturbance.  Respiratory: Negative for cough and shortness of breath.   Cardiovascular: Negative for chest pain and leg swelling.  Gastrointestinal: Negative for vomiting, abdominal pain and diarrhea.  Genitourinary: Negative for dysuria, frequency and flank pain.  Skin:       Facial bruising as per history of present illness.  Left elbow laceration as per history of present illness.   Neurological: Negative for dizziness and headaches.  All other systems reviewed and are negative.    Allergies  Metformin and related and Lisinopril  Home Medications   Current Outpatient Rx  Name  Route  Sig  Dispense  Refill  . ALENDRONATE SODIUM 70 MG PO TABS   Oral   Take 1 tablet (70 mg total) by mouth every 7 (seven) days. Take with a full glass of water on an empty stomach.   12 tablet   3   . ASPIRIN 81 MG  PO TBEC   Oral   Take 81 mg by mouth daily.           Letta Pate ULTRA 2 W/DEVICE KIT      Use to test blood sugars   1 each   0   . CALCIUM CARBONATE-VITAMIN D 600-400 MG-UNIT PO CHEW   Oral   Chew 1 tablet by mouth 2 (two) times daily.   60 tablet   6   . DICLOFENAC SODIUM 1 % TD GEL   Topical   Apply 1 application topically 4 (four) times daily.   1 Tube   3   . FOLIC ACID 1 MG PO TABS   Oral   Take 1 tablet (1 mg total) by mouth daily.   90 tablet   6   . FUROSEMIDE 40 MG PO TABS   Oral   Take 1 tablet (40 mg total) by mouth daily.   30 tablet   11   . GLIPIZIDE 5 MG PO TABS      TAKE 1 TABLET (5 MG TOTAL) BY MOUTH DAILY.   30 tablet   5   . GLUCOSE BLOOD VI STRP      Check blood sugar  daily.   100 each   10   . LIDOCAINE 5 % EX PTCH   Transdermal   Place 1 patch onto the skin every 12 (twelve) hours. Remove & Discard patch within 12 hours or as directed by MD   60 patch   0   . LOSARTAN POTASSIUM 100 MG PO TABS   Oral   Take 1 tablet (100 mg total) by mouth daily.   30 tablet   11   . METOPROLOL TARTRATE 100 MG PO TABS      TAKE 1 TABLET (100 MG TOTAL) BY MOUTH 2 (TWO) TIMES DAILY.   60 tablet   6   . NITROGLYCERIN 0.4 MG SL SUBL   Sublingual   Place 1 tablet (0.4 mg total) under the tongue every 5 (five) minutes as needed.   90 tablet   0   . [employer] NATURAL FISH OIL 1000 MG PO CAPS      TAKE 1 CAPSULE (1 G TOTAL) BY MOUTH DAILY.   30 capsule   11   . OMEPRAZOLE 40 MG PO CPDR      TAKE 1 CAPSULE (40 MG TOTAL) BY MOUTH DAILY.   30 capsule   3   . PRAVASTATIN SODIUM 40 MG PO TABS      TAKE 1 TABLET (40 MG TOTAL) BY MOUTH DAILY.   30 tablet   5   . PREDNISONE 10 MG PO TABS   Oral   Take 1 tablet (10 mg total) by mouth daily.   31 tablet   11   . SERTRALINE HCL 25 MG PO TABS      TAKE 1 TABLET (25 MG TOTAL) BY MOUTH DAILY.   30 tablet   2     BP 115/52  Pulse 50  Temp 98.2 F (36.8 C) (Oral)  Resp 18  SpO2 98%  Physical Exam  Nursing note and vitals reviewed. Constitutional: She is oriented to person, place, and time. She appears well-developed and well-nourished. No distress.       Pleasant, appears comfortable Denies pain  HENT:  Head: Normocephalic.       There is a bruising and mild swelling in the left lower face. No pain reported with palpation over maxilla or mandible. Nose appears  intact  Eyes: Conjunctivae normal and EOM are normal. Pupils are equal, round, and reactive to light. No scleral icterus.  Neck: Neck supple. No JVD present.  Cardiovascular: Exam reveals no friction rub.   Murmur heard.      Irregular on atrial fibrillation  Pulmonary/Chest: Breath sounds normal. No respiratory distress. She has no  wheezes. She has no rales. She exhibits no tenderness.  Neurological: She is alert and oriented to person, place, and time. She has normal strength. No cranial nerve deficit or sensory deficit.       No face drop. No arm drift.  .   Skin: No rash noted. She is not diaphoretic.       There is small 1 cm laceration over the olecranon in the left elbow there is clear transudate.     ED Course  Procedures (including critical care time)  Labs Reviewed - No data to display No results found.   1. Atrial fibrillation   2. Fall    EKG: Atrial fibrillation 75 beats per minutes sporadic PVCs. No acute ischemic changes.   MDM  71 year old female with history of A. fib, diabetes and hypertension among other comorbidities. Here with her daughter concerned about a recent episode of "fall" 2 days ago. Patient was found in the floor one-family got home. Has a bruise on the left side of the face and has a left elbow small laceration. Apparently she fell from bed overnight and was found still in the floor in the afternoon the next day. It difficult to get a clear history from patient, denies weakness or difficulty moving her arms or legs. Denies dysuria, fever or chills. Denies current headache, visual changes. On exam. She is on atrial fibrillation rate 40s to 70s, alert and oriented. No focal neurologic findings. Blood pressure appear stable in the 110s/50's. It is possible this patient had a TIA but concerning labile heart rate. Decided to transfer to the emergency department for further evaluation and management.         Sharin Grave, MD 09/08/12 1303

## 2012-09-08 NOTE — ED Notes (Signed)
Per daughter pt fell out of bed while sleeping on Thursday and laid in the floor from 830 am until 330 pm until someone came to house. Bruising to face (hit face on night stand) and injured left arm - states that she has no pain now. During triage heart varied from 74 to 47 to 36 and back to 76 - pt reports hx of leaky heart valve

## 2012-09-10 ENCOUNTER — Telehealth: Payer: Self-pay | Admitting: *Deleted

## 2012-09-10 NOTE — Telephone Encounter (Signed)
Daughter called clinic on 09/07/12 and stated mother fell out of bed about 2 nights ago and laid on floor most of night. Face is bruised. Pt will not go to ER or Urgent Care for treatment. Offered appt for this week to be checked. Will talk with pt ( mother ) about appt. Stanton Kidney Malee Grays RN 09/10/12 9AM

## 2012-09-10 NOTE — Telephone Encounter (Signed)
Per [facility], pt went to the ED. 

## 2012-09-12 ENCOUNTER — Ambulatory Visit (INDEPENDENT_AMBULATORY_CARE_PROVIDER_SITE_OTHER): Payer: Medicare Other | Admitting: Internal Medicine

## 2012-09-12 ENCOUNTER — Encounter: Payer: Self-pay | Admitting: Internal Medicine

## 2012-09-12 VITALS — BP 128/82 | HR 70 | Temp 96.5°F | Ht 70.0 in | Wt 220.5 lb

## 2012-09-12 DIAGNOSIS — I10 Essential (primary) hypertension: Secondary | ICD-10-CM

## 2012-09-12 DIAGNOSIS — Z79899 Other long term (current) drug therapy: Secondary | ICD-10-CM

## 2012-09-12 DIAGNOSIS — S199XXA Unspecified injury of neck, initial encounter: Secondary | ICD-10-CM

## 2012-09-12 DIAGNOSIS — E119 Type 2 diabetes mellitus without complications: Secondary | ICD-10-CM

## 2012-09-12 DIAGNOSIS — W19XXXA Unspecified fall, initial encounter: Secondary | ICD-10-CM

## 2012-09-12 DIAGNOSIS — I4891 Unspecified atrial fibrillation: Secondary | ICD-10-CM

## 2012-09-12 LAB — GLUCOSE, CAPILLARY: Glucose-Capillary: 148 mg/dL — ABNORMAL HIGH (ref 70–99)

## 2012-09-12 NOTE — Patient Instructions (Addendum)
General Instructions: Your diabetes control has improved slightly.  Continue the exercise regimen that you have established with lifting weights and canned vegetables. Your facial bruise is healing well.  It may take some time before the discoloration resolves completely. There were no broken bones or signs of stroke on the CT Scan of your head. Follow-up with your primary Doctor in another 2 months.  Treatment Goals:  Goals (1 Years of Data) as of 09/12/2012          As of Today 09/08/12 09/08/12 09/08/12 09/08/12     Blood Pressure    . Blood Pressure < 140/90  128/82 127/67 112/43 124/62 115/52     Result Component    . HEMOGLOBIN A1C < 8  7.1          Progress Toward Treatment Goals:  Treatment Goal 09/12/2012  Hemoglobin A1C improved  Blood pressure at goal    Self Care Goals & Plans:  Self Care Goal 09/12/2012  Monitor my health keep track of my blood glucose; bring my glucose meter and log to each visit  Eat healthy foods drink diet soda or water instead of juice or soda; eat more vegetables; eat fruit for snacks and desserts  Be physically active find an activity I enjoy    Home Blood Glucose Monitoring 09/12/2012  Check my blood sugar once a day     Care Management & Community Referrals: None at this time.  If you feel that you need more home safety equipment please contact the clinic.

## 2012-09-12 NOTE — Assessment & Plan Note (Signed)
BP Readings from Last 3 Encounters:  09/12/12 128/82  09/08/12 127/67  09/08/12 115/52    Lab Results  Component Value Date   NA 144 06/27/2012   K 3.8 06/27/2012   CREATININE 0.93 06/27/2012    Assessment:  Blood pressure control:    Progress toward BP goal:     Comments:   Plan:  Medications:  continue current medications  Educational resources provided: brochure  Self management tools provided:    Other plans: continue metoprolol 100 mg qd, lasix 40 mg qd, losartan 100 mg qd

## 2012-09-12 NOTE — Assessment & Plan Note (Addendum)
Lab Results  Component Value Date   HGBA1C 7.1 09/12/2012   HGBA1C 7.3 06/27/2012   HGBA1C 6.6 03/13/2012     Assessment:  Diabetes control: fair control  Progress toward A1C goal:  improved  Comments: declined foot exam today  Plan:  Medications:  continue current medications  Home glucose monitoring:   Frequency: once a day   Timing:    Instruction/counseling given: reminded to bring medications to each visit, discussed foot care, discussed the need for weight loss and discussed diet  Educational resources provided: brochure  Self management tools provided:    Other plans: pt to continue weight-loss efforts via better diet and weight-lifting with small free weights and canned vegetables

## 2012-09-12 NOTE — Progress Notes (Signed)
  Subjective:    Patient ID: Stephanie Horne, female    DOB: [birth year], 71 y.o.   MRN: [id number]  HPI Pt with hx significant for hypertension, diabetes mellitus, systemic lupus erythematosus, autoimmune hemolytic anemia on chronic prednisone, moderate mitral regurgitation and atrial fibrillation rate controlled with metoprolol (prior GIB thus no coumadin) who presents for post ED visit f/u after fall from bed and hitting her face and left elbow on the nightstand.  Pt still unclear of the circumstances of how the event occur.  Denies loss of consciousness as she was asleep when it occurred. CT of head in ED without evidence of stroke or fractures. Denies any facial pain today.  Elbow a little sore. States  That she hasn't needed to take pain medications.   Review of Systems  Constitutional: Positive for fatigue. Negative for fever and chills.  Respiratory: Negative for shortness of breath.   Cardiovascular: Negative for chest pain.  Musculoskeletal: Positive for arthralgias.  Neurological: Negative for dizziness, syncope, light-headedness and headaches.  Psychiatric/Behavioral: Negative for confusion.       Objective:   Physical Exam  Constitutional: She is oriented to person, place, and time. She appears well-developed and well-nourished. No distress.       Pleasant AA female in wheelchair  HENT:  Head: Normocephalic.    Eyes: Conjunctivae normal and EOM are normal. Pupils are equal, round, and reactive to light.  Neck: Normal range of motion. Neck supple.  Cardiovascular: An irregularly irregular rhythm present.  Murmur heard. Pulmonary/Chest: Effort normal and breath sounds normal.  Neurological: She is alert and oriented to person, place, and time.  Skin: Skin is warm and dry.  Psychiatric: She has a normal mood and affect. Her behavior is normal. Judgment and thought content normal.          Assessment & Plan:  1. Facial trauma s/p fall: bruising healing well, non-tender,  no fractures on CT of head, pt home already equipped with home safety bars, removal of carpets etc.  This was completed for the care of her husband at home who is now deceased.  Pt has much family support with children and grandchildren.  2. Htn: at goal, cont BB, ACR, diuretic  3. DM: HgbA1c slightly above goal at 7.1 but improved from prior 7.3, pt blames increase on holiday eating and visits with family members -declined foot exam today

## 2012-10-01 ENCOUNTER — Other Ambulatory Visit: Payer: Self-pay | Admitting: *Deleted

## 2012-10-01 MED ORDER — FOLIC ACID 1 MG PO TABS: 1.0000 mg | ORAL_TABLET | Freq: Every day | ORAL | Status: DC

## 2012-10-04 ENCOUNTER — Telehealth: Payer: Self-pay | Admitting: *Deleted

## 2012-10-04 NOTE — Telephone Encounter (Signed)
Agree. Thanks

## 2012-10-04 NOTE — Telephone Encounter (Signed)
Someone calls for pt and states she has swelling around her eyes and in her cheeks, she is congested and feels very bad, does not know if there are fevers. States that the last time she got like this she was admitted to hosp. She is ask to go to urg care or ED asap, states pt does not want to, i explained no appts until late next week and md's office advises to be seen in urg care or ED asap, spokesperson agreeable.

## 2012-10-11 ENCOUNTER — Ambulatory Visit: Payer: Medicare Other

## 2012-10-15 NOTE — Telephone Encounter (Signed)
Called patient to monitor progress.  Patient reports she is feeling better but still congested.  She feels increased energy and less muscle aches.  She is afebrile.  If she is not significantly better in the next week, she should call clinic for appointment.

## 2012-11-06 ENCOUNTER — Other Ambulatory Visit: Payer: Self-pay | Admitting: Internal Medicine

## 2012-12-02 ENCOUNTER — Other Ambulatory Visit: Payer: Self-pay | Admitting: Internal Medicine

## 2012-12-05 ENCOUNTER — Other Ambulatory Visit: Payer: Self-pay | Admitting: Internal Medicine

## 2012-12-17 ENCOUNTER — Telehealth: Payer: Self-pay | Admitting: Cardiovascular Disease

## 2012-12-17 NOTE — Telephone Encounter (Signed)
Last result was from 07/2012, echo reviewed and was reviewed prior, pt is having more SOB and family member wanted to know if the echo results correlated with sx. I reviewed test result of MV regurge, salt intake, afib and daily weights with family members. Family member states pt eats canned soup everyday and other high sodium foods. She will make app with pcp and adjust diet, told her to call with further questions and concerns, she agreed to plan.

## 2012-12-17 NOTE — Telephone Encounter (Signed)
Pt had a test done sometime bask and needs results, pls call

## 2013-01-08 ENCOUNTER — Encounter: Payer: Self-pay | Admitting: Internal Medicine

## 2013-01-08 DIAGNOSIS — R269 Unspecified abnormalities of gait and mobility: Secondary | ICD-10-CM | POA: Insufficient documentation

## 2013-02-02 ENCOUNTER — Other Ambulatory Visit: Payer: Self-pay | Admitting: Internal Medicine

## 2013-02-09 ENCOUNTER — Other Ambulatory Visit: Payer: Self-pay | Admitting: Internal Medicine

## 2013-02-13 ENCOUNTER — Encounter: Payer: Self-pay | Admitting: Internal Medicine

## 2013-02-13 ENCOUNTER — Ambulatory Visit (INDEPENDENT_AMBULATORY_CARE_PROVIDER_SITE_OTHER): Payer: Medicare Other | Admitting: Internal Medicine

## 2013-02-13 VITALS — BP 135/81 | HR 77 | Wt 217.1 lb

## 2013-02-13 DIAGNOSIS — Z0000 Encounter for general adult medical examination without abnormal findings: Secondary | ICD-10-CM

## 2013-02-13 DIAGNOSIS — Z9189 Other specified personal risk factors, not elsewhere classified: Secondary | ICD-10-CM

## 2013-02-13 DIAGNOSIS — E119 Type 2 diabetes mellitus without complications: Secondary | ICD-10-CM

## 2013-02-13 DIAGNOSIS — Z23 Encounter for immunization: Secondary | ICD-10-CM

## 2013-02-13 DIAGNOSIS — J309 Allergic rhinitis, unspecified: Secondary | ICD-10-CM

## 2013-02-13 DIAGNOSIS — E785 Hyperlipidemia, unspecified: Secondary | ICD-10-CM

## 2013-02-13 DIAGNOSIS — I10 Essential (primary) hypertension: Secondary | ICD-10-CM

## 2013-02-13 DIAGNOSIS — Z1239 Encounter for other screening for malignant neoplasm of breast: Secondary | ICD-10-CM

## 2013-02-13 LAB — GLUCOSE, CAPILLARY: Glucose-Capillary: 127 mg/dL — ABNORMAL HIGH (ref 70–99)

## 2013-02-13 LAB — POCT GLYCOSYLATED HEMOGLOBIN (HGB A1C): Hemoglobin A1C: 6.5

## 2013-02-13 MED ORDER — FLUTICASONE PROPIONATE 50 MCG/ACT NA SUSP: 2.0000 | Freq: Every day | NASAL | Status: DC

## 2013-02-13 MED ORDER — CALCIUM CARBONATE-VITAMIN D 600-400 MG-UNIT PO TABS: 1.0000 | ORAL_TABLET | Freq: Every day | ORAL | Status: DC

## 2013-02-13 MED ORDER — ALENDRONATE SODIUM 70 MG PO TABS: 70.0000 mg | ORAL_TABLET | ORAL | Status: DC

## 2013-02-13 NOTE — Assessment & Plan Note (Signed)
mammo ordered; did not order zostavax since chronic immunosuppression (chronic prednisone therapy)

## 2013-02-13 NOTE — Progress Notes (Signed)
Subjective:   Patient ID: Stephanie Horne female   DOB: 06/10/1941 72 y.o.   MRN: [id number]  HPI: Stephanie Horne is a 61 y.o. woman with history of hypertension, lupus, atrial fibrillation, and diabetes. I last saw patient in clinic on 06/27/12 for routine follow up.  Has been battling some SOB and followed by Dr. Excell Seltzer of [facility]; she is going to start going to the [facility], and is interested in silver sneakers. Also interested in acquring a scooter to be able to grocery shop and be out in the community [employer]).  Of note, she does have h/o emphysema  Recent fall when reaching to pick something up and fell out of chair, got up without any problems (about 3 months ago) Occasional heart burn and will use omeprazole prn - enjoys spicy foods Grand daughter lives with her now  She reports nasal congestion & sneezing for the last month and has not taken anything for her allergies (she has history of seasonal allergies).  She is also interested in simplifying her medication list at this time.    She got a letter from her insurance company asking that she complete her mammo and take her zostavax.  I also filled out paperwork for a permanent handicap placard (given that she cannot walk >200 ft without stopping for rest, has chronic MSK problems due to sacral fracture in the past, and uses a walker)  Past Medical History  Diagnosis Date  . Hyperlipidemia   . Hypertension   . Back pain, chronic   . Lupus     Treated with prednisone indefinitely   . Seasonal allergies   . Mitral regurgitation   . Atrial fibrillation     not on coumidin because AIHA and major bleeding complications  . Axillary lymphadenopathy     left negative MMG 1/08  . AIHA (autoimmune hemolytic anemia)     12/08 onset  . Diabetes mellitus   . Complicated grief 2012-03-09    Death of husband in 27-Feb-2011   . MITRAL REGURGITATION, MODERATE 10/24/2006    2d Echo from Feb 27, 2008 - EF 60%, Moderated MR     Current Outpatient  Prescriptions  Medication Sig Dispense Refill  . alendronate (FOSAMAX) 70 MG tablet Take 70 mg by mouth every 7 (seven) days. Take with a full glass of water on an empty stomach on Saturdays      . aspirin 81 MG EC tablet Take 81 mg by mouth daily.        . Calcium Carbonate-Vitamin D (CALTRATE 600+D PO) Take 600 mg by mouth daily.      . diclofenac sodium (VOLTAREN) 1 % GEL Apply 1 application topically 2 (two) times daily as needed. For leg, left hip and foot pain      . folic acid (FOLVITE) 1 MG tablet Take 1 tablet (1 mg total) by mouth daily.  90 tablet  6  . furosemide (LASIX) 40 MG tablet Take 1 tablet (40 mg total) by mouth daily.  30 tablet  11  . glipiZIDE (GLUCOTROL) 5 MG tablet TAKE 1 TABLET (5 MG TOTAL) BY MOUTH DAILY.  30 tablet  5  . losartan (COZAAR) 100 MG tablet Take 1 tablet (100 mg total) by mouth daily.  30 tablet  11  . metoprolol (LOPRESSOR) 100 MG tablet Take 100 mg by mouth 2 (two) times daily.      . nitroGLYCERIN (NITROSTAT) 0.4 MG SL tablet Place 0.4 mg under the tongue every 5 (five) minutes as  needed. For chest pain      . Omega-3 Fatty Acids (FISH OIL) 1000 MG CAPS Take 1,000 mg by mouth daily.      Marland Kitchen omeprazole (PRILOSEC) 40 MG capsule TAKE 1 CAPSULE (40 MG TOTAL) BY MOUTH DAILY.  30 capsule  3  . Polyethyl Glycol-Propyl Glycol (SYSTANE OP) Place 1 drop into both eyes daily.      . pravastatin (PRAVACHOL) 40 MG tablet TAKE 1 TABLET (40 MG TOTAL) BY MOUTH DAILY.  30 tablet  5  . predniSONE (DELTASONE) 10 MG tablet TAKE 1 TABLET (10 MG TOTAL) BY MOUTH DAILY.  31 tablet  9   No current facility-administered medications for this visit.   Family History  Problem Relation Age of Onset  . Heart disease Mother    History   Social History  . Marital Status: Married    Spouse Name: N/A    Number of Children: N/A  . Years of Education: N/A   Social History Main Topics  . Smoking status: Former Games developer  . Smokeless tobacco: None  . Alcohol Use: No  . Drug Use:  No  . Sexually Active: None     Comment: married, retired Engineer, materials, no smoking, alochol, illegal drug, her husband was clinic patient and died of lung cancer   Other Topics Concern  . None   Social History Narrative  . None   Review of Systems: Constitutional: Denies fever, chills, diaphoresis, appetite change and fatigue.  HEENT: Denies photophobia, eye pain, redness, hearing loss, ear pain, sore throat, mouth sores, trouble swallowing, neck pain, neck stiffness and tinnitus.   Respiratory: Denies cough, chest tightness,  and wheezing.   Cardiovascular: Denies chest pain, palpitations and leg swelling.  Gastrointestinal: Denies nausea, vomiting, abdominal pain, diarrhea, constipation, blood in stool and abdominal distention.  Genitourinary: Denies dysuria, urgency, frequency, hematuria, flank pain and difficulty urinating.  Endocrine: Denies: hot or cold intolerance, sweats, changes in hair or nails, polyuria, polydipsia. Musculoskeletal: chronic back pain.  Skin: Denies pallor, rash and wound.  Neurological: Denies dizziness, seizures, syncope, weakness, light-headedness, numbness and headaches.  Psychiatric/Behavioral: Denies suicidal ideation, mood changes, confusion, nervousness, sleep disturbance and agitation  Objective:  Physical Exam: Filed Vitals:   02/13/13 1509  BP: 135/81  Pulse: 77  Weight: 217 lb 1.6 oz (98.476 kg)  SpO2: 94%   Constitutional: Vital signs reviewed.  Patient is a well-developed and well-nourished woman in no acute distress and cooperative with exam. In a wheelchair, usually with rolling walker; accompanied by daughter Head: Normocephalic and atraumatic Mouth: no erythema or exudates, MMM Eyes: PERRL, EOMI, conjunctivae normal, No scleral icterus.  Cardiovascular: RRR, S1 normal, S2 normal, no MRG, pulses symmetric and intact bilaterally Pulmonary/Chest: CTAB, no wheezes, rales, or rhonchi Abdominal: Soft. Non-tender, non-distended, bowel  sounds are normal, no masses, organomegaly, or guarding present.  Neurological: A&O x3, Strength is normal and symmetric bilaterally, cranial nerve II-XII are grossly intact, no focal motor deficit, sensory intact to light touch bilaterally.  Skin: Warm, dry and intact. No rash, cyanosis, or clubbing.  Psychiatric: Normal mood and affect. speech and behavior is normal. Judgment and thought content normal. Cognition and memory are normal.   Assessment & Plan:  Case and care discussed with Dr. Dalphine Handing.  Please see problem oriented charting for further details. Patient to return in 3 months for DM and HLD follow up.  She will also make a separate appt for motorized wheelchair/scooter assessment.    In 3 months -> lipid a1c bmet

## 2013-02-13 NOTE — Assessment & Plan Note (Signed)
Today we discontinued fish oil in efforts to simplify med list.  Will check lipid profile at next visit.

## 2013-02-13 NOTE — Assessment & Plan Note (Signed)
BP Readings from Last 3 Encounters:  02/13/13 135/81  09/12/12 128/82  09/08/12 127/67    Lab Results  Component Value Date   NA 144 06/27/2012   K 3.8 06/27/2012   CREATININE 0.93 06/27/2012    Assessment: Blood pressure control: controlled Progress toward BP goal:  at goal  Plan: Medications:  continue current medications - metoprolol 100, lasix 40 (b/l LE edema d/t venous insuff, and losartan 100) Educational resources provided: brochure;video Self management tools provided: home blood pressure logbook Other plans: BMET at follow up

## 2013-02-13 NOTE — Assessment & Plan Note (Signed)
Lab Results  Component Value Date   HGBA1C 6.5 02/13/2013   HGBA1C 7.1 09/12/2012   HGBA1C 7.3 06/27/2012     Assessment: Diabetes control: good control (HgbA1C at goal) Progress toward A1C goal:  at goal (<8)  Plan: Medications:  will discontinue glipizide in efforts to simplify med list, and recheck A1c in 3 months with goal <8; this is in setting of chronic prednisone therapy but improved diet. Home glucose monitoring: Frequency: once a day Timing:   Instruction/counseling given: reminded to bring blood glucose meter & log to each visit, reminded to bring medications to each visit and discussed diet Educational resources provided: brochure Self management tools provided: copy of home glucose meter download;home glucose logbook

## 2013-02-13 NOTE — Patient Instructions (Addendum)
General Instructions:  -We are going to do a trial off of your glipizide - do not take this medication for the next 3 months and we will re-assess at your next visit  -You have to schedule a visit with me specifically for the motorized wheelchair so that [facility] home care can help [place] fill out the paperwork  -Be sure to have your mammogram done; we are not going to have you get the shingles vaccine since you are on prednisone, which weakens your immune system  -You may stop taking fish oil as well, we will check your cholesterol at the next visit.  Please be sure to bring all of your medications with you to every visit.  Should you have any new or worsening symptoms, please be sure to call the clinic at [phone].   Treatment Goals:  Goals (1 Years of Data) as of 02/13/13         As of Today 09/12/12 09/08/12 09/08/12 09/08/12     Blood Pressure    . Blood Pressure < 140/90  135/81 128/82 127/67 112/43 124/62     Lifestyle    . Prevent Falls           Result Component    . HEMOGLOBIN A1C < 8  6.5 7.1         Progress Toward Treatment Goals:  Treatment Goal 02/13/2013  Hemoglobin A1C at goal  Blood pressure at goal    Self Care Goals & Plans:  Self Care Goal 02/13/2013  Manage my medications take my medicines as prescribed; refill my medications on time  Monitor my health keep track of my blood glucose; bring my glucose meter and log to each visit; check my feet daily  Eat healthy foods eat foods that are low in salt; eat baked foods instead of fried foods  Be physically active find an activity I enjoy    Home Blood Glucose Monitoring 02/13/2013  Check my blood sugar once a day     Care Management & Community Referrals:

## 2013-02-14 NOTE — Progress Notes (Signed)
INTERNAL MEDICINE TEACHING ATTENDING ADDENDUM: I discussed this case with Dr. Sharda soon after the patient visit. I have read the documentation and I agree with the plan of care. Please see the resident note for details of management.   

## 2013-03-04 ENCOUNTER — Ambulatory Visit (HOSPITAL_COMMUNITY)
Admission: RE | Admit: 2013-03-04 | Discharge: 2013-03-04 | Disposition: A | Discharge status: Home / Self Care | Payer: Medicare Other | Source: Ambulatory Visit | Attending: Internal Medicine | Admitting: Internal Medicine

## 2013-03-04 DIAGNOSIS — Z1231 Encounter for screening mammogram for malignant neoplasm of breast: Secondary | ICD-10-CM | POA: Insufficient documentation

## 2013-03-04 DIAGNOSIS — Z1239 Encounter for other screening for malignant neoplasm of breast: Secondary | ICD-10-CM

## 2013-04-11 ENCOUNTER — Other Ambulatory Visit: Payer: Self-pay

## 2013-05-22 ENCOUNTER — Other Ambulatory Visit: Payer: Self-pay | Admitting: Internal Medicine

## 2013-05-29 ENCOUNTER — Ambulatory Visit (INDEPENDENT_AMBULATORY_CARE_PROVIDER_SITE_OTHER): Payer: Medicare Other | Admitting: Internal Medicine

## 2013-05-29 ENCOUNTER — Encounter: Payer: Self-pay | Admitting: Internal Medicine

## 2013-05-29 VITALS — BP 123/70 | HR 52 | Temp 97.3°F | Wt 210.5 lb

## 2013-05-29 DIAGNOSIS — Z23 Encounter for immunization: Secondary | ICD-10-CM

## 2013-05-29 DIAGNOSIS — E119 Type 2 diabetes mellitus without complications: Secondary | ICD-10-CM

## 2013-05-29 DIAGNOSIS — M545 Low back pain: Secondary | ICD-10-CM

## 2013-05-29 DIAGNOSIS — M810 Age-related osteoporosis without current pathological fracture: Secondary | ICD-10-CM

## 2013-05-29 DIAGNOSIS — I4891 Unspecified atrial fibrillation: Secondary | ICD-10-CM

## 2013-05-29 DIAGNOSIS — E785 Hyperlipidemia, unspecified: Secondary | ICD-10-CM

## 2013-05-29 DIAGNOSIS — G4700 Insomnia, unspecified: Secondary | ICD-10-CM | POA: Insufficient documentation

## 2013-05-29 DIAGNOSIS — I10 Essential (primary) hypertension: Secondary | ICD-10-CM

## 2013-05-29 DIAGNOSIS — Z0000 Encounter for general adult medical examination without abnormal findings: Secondary | ICD-10-CM

## 2013-05-29 LAB — LIPID PANEL
Cholesterol: 165 mg/dL (ref 0–200)
Triglycerides: 172 mg/dL — ABNORMAL HIGH (ref <150)

## 2013-05-29 LAB — GLUCOSE, CAPILLARY: Glucose-Capillary: 67 mg/dL — ABNORMAL LOW (ref 70–99)

## 2013-05-29 LAB — BASIC METABOLIC PANEL
BUN: 13 mg/dL (ref 6–23)
Creat: 0.78 mg/dL (ref 0.50–1.10)

## 2013-05-29 MED ORDER — DICLOFENAC SODIUM 1 % TD GEL: 1.0000 "application " | Freq: Two times a day (BID) | TRANSDERMAL | Status: DC | PRN

## 2013-05-29 MED ORDER — METOPROLOL TARTRATE 50 MG PO TABS: 50.0000 mg | ORAL_TABLET | Freq: Two times a day (BID) | ORAL | Status: DC

## 2013-05-29 MED ORDER — ACETAMINOPHEN 500 MG PO TABS: 1000.0000 mg | ORAL_TABLET | Freq: Three times a day (TID) | ORAL | Status: AC

## 2013-05-29 MED ORDER — TRAZODONE 25 MG HALF TABLET: 25.0000 mg | ORAL_TABLET | Freq: Every evening | ORAL | Status: DC | PRN

## 2013-05-29 NOTE — Assessment & Plan Note (Signed)
On chronic prednisone tx, S/p Bisphosphonate tx for >5 years.  D/c fosamax.  Cont VitD+Ca. -Check DEXA to see if role for other agent such as prolia or forteo

## 2013-05-29 NOTE — Assessment & Plan Note (Signed)
Asked patient to d/c use of benadryl.  Start trial of Trazodone 25mg  qHS. May increase to 50mg  if needed.

## 2013-05-29 NOTE — Assessment & Plan Note (Signed)
Compliant with daily statin.  Fish oil d/c at last visit. -Lipid profile today

## 2013-05-29 NOTE — Progress Notes (Signed)
Subjective:   Patient ID: Stephanie Horne female   DOB: 1940/11/20 72 y.o.   MRN: [id number]  HPI: [name] is a 72 y.o. woman with history of hypertension, lupus, atrial fibrillation, and diabetes. I last saw patient in clinic on 02/13/13 for routine follow up.  She has not complaints today except for chronic low back pain, that is somewhat better with voltaren gel.   Recent fall last Saturday --> while getting up from low toilet at son's place in [place]; not preceded by dizziness/lightheadedness   Manages own pill box for a week. Living with niece. Sleeping well with benadryl qHS. Has a hard time falling asleep but can stay asleep. No daytime naps.   Review of Systems: Constitutional: Denies fever, chills, diaphoresis, appetite change and fatigue.  HEENT: Denies photophobia, eye pain, redness, hearing loss, ear pain, congestion, sore throat, rhinorrhea, sneezing, mouth sores, trouble swallowing, neck pain, neck stiffness and tinnitus.  Respiratory: Denies SOB, DOE, cough, chest tightness, and wheezing.  Cardiovascular: Denies chest pain, palpitations and leg swelling.  Gastrointestinal: Denies nausea, vomiting, abdominal pain, diarrhea, constipation,blood in stool and abdominal distention.  Genitourinary: Denies dysuria, urgency, frequency, hematuria, flank pain and difficulty urinating.   Skin: Denies pallor, rash and wound.  Neurological: Denies dizziness, syncope, weakness, lightheadedness, and headaches.   Past Medical History  Diagnosis Date  . Hyperlipidemia   . Hypertension   . Back pain, chronic   . Lupus     Treated with prednisone indefinitely   . Seasonal allergies   . Mitral regurgitation   . Atrial fibrillation     not on coumidin because AIHA and major bleeding complications  . Axillary lymphadenopathy     left negative MMG 1/08  . AIHA (autoimmune hemolytic anemia)     12/08 onset  . Diabetes mellitus   . Complicated grief 2012-03-06    Death of  husband in 2011/02/23   . MITRAL REGURGITATION, MODERATE 10/24/2006    2d Echo from 02/23/08 - EF 60%, Moderated MR     Current Outpatient Prescriptions  Medication Sig Dispense Refill  . alendronate (FOSAMAX) 70 MG tablet Take 1 tablet (70 mg total) by mouth every 7 (seven) days. Take with a full glass of water on an empty stomach on Saturdays  20 tablet  5  . aspirin 81 MG EC tablet Take 81 mg by mouth daily.        . Calcium Carbonate-Vitamin D (CALTRATE 600+D) 600-400 MG-UNIT per tablet Take 1 tablet by mouth daily.  30 tablet  5  . diclofenac sodium (VOLTAREN) 1 % GEL Apply 1 application topically 2 (two) times daily as needed. For leg, left hip and foot pain      . fluticasone (FLONASE) 50 MCG/ACT nasal spray Place 2 sprays into the nose daily.  16 g  3  . folic acid (FOLVITE) 1 MG tablet Take 1 tablet (1 mg total) by mouth daily.  90 tablet  6  . furosemide (LASIX) 40 MG tablet Take 1 tablet (40 mg total) by mouth daily.  30 tablet  11  . losartan (COZAAR) 100 MG tablet Take 1 tablet (100 mg total) by mouth daily.  30 tablet  11  . metoprolol (LOPRESSOR) 100 MG tablet TAKE 1 TABLET (100 MG TOTAL) BY MOUTH 2 (TWO) TIMES DAILY.  60 tablet  6  . nitroGLYCERIN (NITROSTAT) 0.4 MG SL tablet Place 0.4 mg under the tongue every 5 (five) minutes as needed. For chest pain      .  Omega-3 Fatty Acids (FISH OIL) 1000 MG CAPS Take 1,000 mg by mouth daily.      Marland Kitchen omeprazole (PRILOSEC) 40 MG capsule TAKE 1 CAPSULE (40 MG TOTAL) BY MOUTH DAILY.  30 capsule  3  . Polyethyl Glycol-Propyl Glycol (SYSTANE OP) Place 1 drop into both eyes daily.      . pravastatin (PRAVACHOL) 40 MG tablet TAKE 1 TABLET (40 MG TOTAL) BY MOUTH DAILY.  30 tablet  5  . predniSONE (DELTASONE) 10 MG tablet TAKE 1 TABLET (10 MG TOTAL) BY MOUTH DAILY.  31 tablet  9  . [DISCONTINUED] Calcium Carbonate-Vitamin D (CALTRATE 600+D PO) Take 600 mg by mouth daily.       No current facility-administered medications for this visit.   Family History    Problem Relation Age of Onset  . Heart disease Mother    History   Social History  . Marital Status: Married    Spouse Name: N/A    Number of Children: N/A  . Years of Education: N/A   Social History Main Topics  . Smoking status: Former Games developer  . Smokeless tobacco: Not on file  . Alcohol Use: No  . Drug Use: No  . Sexual Activity: Not on file     Comment: married, retired Engineer, materials, no smoking, alochol, illegal drug, her husband was clinic patient and died of lung cancer   Other Topics Concern  . Not on file   Social History Narrative  . No narrative on file    Objective:  Physical Exam: Filed Vitals:   05/29/13 1548  BP: 123/70  Pulse: 52  Temp: 97.3 F (36.3 C)  TempSrc: Oral  Weight: 210 lb 8 oz (95.482 kg)  SpO2: 99%   HEENT: PERRL, EOMI, no scleral icterus Cardiac: bradycardic, no rubs, murmurs or gallops Pulm: clear to auscultation bilaterally, moving normal volumes of air Abd: soft, nontender, nondistended, BS normoactive Ext: warm and well perfused, no pedal edema Neuro: alert and oriented X3, cranial nerves II-XII grossly intact  Assessment & Plan:  Case and care discussed with Dr. Dalphine Handing.  Please see problem oriented charting for further details. Patient to return in 3 months for routine follow up.

## 2013-05-29 NOTE — Assessment & Plan Note (Signed)
Not taking vicodin at this time. Failure of tramadol. Some improvement with voltaren gel.  Multiple imaging with lumbar DJD. -Refill voltaren gel -Start tylenol 1000mg  TID

## 2013-05-29 NOTE — Assessment & Plan Note (Signed)
Bradycardic, lower metoprolol to 50mg  BID (especially because asymptomatic hypoglycemia in clinic today)

## 2013-05-29 NOTE — Patient Instructions (Addendum)
-  Please discontinue fosamax -Please decrease metoprolol to 50mg  twice daily; next time we will talk about discontinuing losartan -Start taking acetaminophen (tylenol) 1000mg  three times daily scheduled -I have also refilled voltaren gel for your back pain -You may stop checking your blood sugar daily, and just take it if you don't feel well -We are giving you your flu shot today

## 2013-05-29 NOTE — Assessment & Plan Note (Signed)
Flu shot today 

## 2013-05-29 NOTE — Assessment & Plan Note (Addendum)
BP well controlled today.  Cont losartan 100mg  daily, decrease metoprolol as in A/P of afib.  BMET today.  If patient wishes to further simplify meds, may consider reducing losartan (most recent echo only significant for mild diastolic failure, but h/o CAD)

## 2013-05-30 NOTE — Progress Notes (Signed)
Case discussed with Dr. Sharda soon after the resident saw the patient.  We reviewed the resident's history and exam and pertinent patient test results.  I agree with the assessment, diagnosis, and plan of care documented in the resident's note. 

## 2013-06-21 ENCOUNTER — Other Ambulatory Visit: Payer: Self-pay | Admitting: Internal Medicine

## 2013-08-06 ENCOUNTER — Other Ambulatory Visit: Payer: Self-pay | Admitting: Internal Medicine

## 2013-08-21 ENCOUNTER — Ambulatory Visit (INDEPENDENT_AMBULATORY_CARE_PROVIDER_SITE_OTHER): Payer: Medicare Other | Admitting: Internal Medicine

## 2013-08-21 ENCOUNTER — Encounter: Payer: Self-pay | Admitting: Internal Medicine

## 2013-08-21 VITALS — BP 141/82 | HR 77 | Temp 98.2°F | Wt 202.6 lb

## 2013-08-21 DIAGNOSIS — E119 Type 2 diabetes mellitus without complications: Secondary | ICD-10-CM

## 2013-08-21 DIAGNOSIS — R269 Unspecified abnormalities of gait and mobility: Secondary | ICD-10-CM

## 2013-08-21 DIAGNOSIS — Z9181 History of falling: Secondary | ICD-10-CM

## 2013-08-21 DIAGNOSIS — M810 Age-related osteoporosis without current pathological fracture: Secondary | ICD-10-CM

## 2013-08-21 DIAGNOSIS — R296 Repeated falls: Secondary | ICD-10-CM

## 2013-08-21 DIAGNOSIS — M545 Low back pain, unspecified: Secondary | ICD-10-CM

## 2013-08-21 LAB — POCT GLYCOSYLATED HEMOGLOBIN (HGB A1C): Hemoglobin A1C: 6.8

## 2013-08-21 LAB — GLUCOSE, CAPILLARY: Glucose-Capillary: 115 mg/dL — ABNORMAL HIGH (ref 70–99)

## 2013-08-21 NOTE — Progress Notes (Signed)
Subjective:   Patient ID: Stephanie Horne female   DOB: July 04, 1941 72 y.o.   MRN: [id number]  HPI: [name] is a 72 y.o. 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 I last saw patient in clinic on 05/29/13 for routine follow up, at which visit she received her flu shot.  At that visit, we also discussed initiation of Tylenol 1000mg  TID scheduled for chronic low back pain in addition to voltaren gel.  She has not used tylenol as suggested because she does not like to take more medications.  She takes tylenol 1-2x / day, but usually waits until she is in pain.  She does reports a diarrheal illness last week, which has subsequently resolved (she attributes to a virus).  2 recent falls, 2 months ago, legs gave out on her (in [place] with her son - we discussed this at last visit actually), then a 2nd time while trying to make bed, no dizziness, just legs give out  Regarding sleep, she has not increased trazodone, and continues to use benadryl to help with sleep  No problems with transportation. Her son brought her today, but is out in the waiting room.  Review of Systems: Constitutional: Denies fever, chills, diaphoresis, appetite change and fatigue.  HEENT: Denies photophobia, eye pain, redness, hearing loss, ear pain, congestion, sore throat, rhinorrhea, sneezing, mouth sores, trouble swallowing, neck pain, neck stiffness and tinnitus.  Respiratory: Denies SOB, DOE, cough, chest tightness, and wheezing.  Cardiovascular: Denies chest pain, palpitations and leg swelling.  Gastrointestinal: Denies nausea, vomiting, abdominal pain, diarrhea, constipation,blood in stool and abdominal distention.  Genitourinary: Denies dysuria, urgency, frequency, hematuria, flank pain and difficulty urinating.  Skin: Denies pallor, rash and wound.  Neurological: Denies dizziness, seizures, syncope, weakness, lightheadedness, numbness and  headaches.  Psychiatric/Behavioral: Denies mood changes, confusion, nervousness, and agitation   Past Medical History  Diagnosis Date  . Hyperlipidemia   . Hypertension   . Back pain, chronic   . Lupus     Treated with prednisone indefinitely   . Seasonal allergies   . Mitral regurgitation   . Atrial fibrillation     not on coumidin because AIHA and major bleeding complications  . Axillary lymphadenopathy     left negative MMG 1/08  . AIHA (autoimmune hemolytic anemia)     12/08 onset  . Diabetes mellitus   . Complicated grief 03-06-2012    Death of husband in 02/23/2011   . MITRAL REGURGITATION, MODERATE 10/24/2006    2d Echo from Feb 23, 2008 - EF 60%, Moderated MR     Current Outpatient Prescriptions  Medication Sig Dispense Refill  . acetaminophen (TYLENOL) 500 MG tablet Take 2 tablets (1,000 mg total) by mouth 3 (three) times daily.  100 tablet  2  . aspirin 81 MG EC tablet Take 81 mg by mouth daily.        . Calcium Carbonate-Vitamin D (CALTRATE 600+D) 600-400 MG-UNIT per tablet Take 1 tablet by mouth daily.  30 tablet  5  . diclofenac sodium (VOLTAREN) 1 % GEL Apply 1 application topically 2 (two) times daily as needed. For leg, left hip and foot pain  1 Tube  5  . fluticasone (FLONASE) 50 MCG/ACT nasal spray Place 2 sprays into the nose daily.  16 g  3  . folic acid (FOLVITE) 1 MG tablet Take 1 tablet (1 mg total) by mouth daily.  90 tablet  6  . furosemide (LASIX) 40 MG tablet  Take 1 tablet (40 mg total) by mouth daily.  30 tablet  11  . glucose blood (ONE TOUCH ULTRA TEST) test strip As needed  100 each  3  . losartan (COZAAR) 100 MG tablet Take 1 tablet (100 mg total) by mouth daily.  30 tablet  11  . metoprolol (LOPRESSOR) 50 MG tablet Take 1 tablet (50 mg total) by mouth 2 (two) times daily.  60 tablet  6  . nitroGLYCERIN (NITROSTAT) 0.4 MG SL tablet Place 0.4 mg under the tongue every 5 (five) minutes as needed. For chest pain      . Omega-3 Fatty Acids (FISH OIL) 1000 MG CAPS  Take 1,000 mg by mouth daily.      Marland Kitchen omeprazole (PRILOSEC) 40 MG capsule TAKE 1 CAPSULE (40 MG TOTAL) BY MOUTH DAILY.  30 capsule  3  . Polyethyl Glycol-Propyl Glycol (SYSTANE OP) Place 1 drop into both eyes daily.      . pravastatin (PRAVACHOL) 40 MG tablet TAKE 1 TABLET (40 MG TOTAL) BY MOUTH DAILY.  30 tablet  5  . predniSONE (DELTASONE) 10 MG tablet TAKE 1 TABLET (10 MG TOTAL) BY MOUTH DAILY.  31 tablet  9  . traZODone (DESYREL) 50 MG tablet TAKE 1 TABLET BY MOUTH AT BEDTIME AS NEEDED  60 tablet  1  . [DISCONTINUED] Calcium Carbonate-Vitamin D (CALTRATE 600+D PO) Take 600 mg by mouth daily.       No current facility-administered medications for this visit.   Family History  Problem Relation Age of Onset  . Heart disease Mother    History   Social History  . Marital Status: Married    Spouse Name: N/A    Number of Children: N/A  . Years of Education: N/A   Social History Main Topics  . Smoking status: Former Games developer  . Smokeless tobacco: None  . Alcohol Use: No  . Drug Use: No  . Sexual Activity: None     Comment: married, retired Engineer, materials, no smoking, alochol, illegal drug, her husband was clinic patient and died of lung cancer   Other Topics Concern  . None   Social History Narrative  . None    Objective:  Physical Exam: Filed Vitals:   08/21/13 1540  BP: 141/82  Pulse: 77  Temp: 98.2 F (36.8 C)  TempSrc: Oral  Weight: 202 lb 9.6 oz (91.899 kg)  SpO2: 96%   General: sitting comfortably in wheel chair HEENT: PERRL, EOMI, no scleral icterus Cardiac: RRR, no rubs, murmurs or gallops Pulm: clear to auscultation bilaterally, moving normal volumes of air Abd: soft, nontender, nondistended, BS normoactive Ext: warm and well perfused, trace pedal edema Neuro: alert and oriented X3, cranial nerves II-XII grossly intact Gait: Knees bow inward, walks on insoles of feet with cane to steady her  Assessment & Plan:  Case and care discussed with Dr. Rogelia Boga.   Please see problem oriented charting for further details. Patient to return in 6 months for routine.

## 2013-08-21 NOTE — Patient Instructions (Signed)
-  Lets get you into physical therapy to work on your strength, stop you from falling, and have an evaluation done for a wheel chair  -For your back pain, consider taking 1000mg  of tylenol 2-3 times a day- scheduled, so you don't get behind on the pain  -Please have your bone scan done as well  -Your diabetes and blood pressure are doing great!  Please be sure to bring all of your medications with you to every visit.  Should you have any new or worsening symptoms, please be sure to call the clinic at [phone].

## 2013-08-23 NOTE — Assessment & Plan Note (Signed)
Again encouraged scheduled tylenol use.  Given recent falls, will refer to PT for strengthening and eval for necessity of wheelchair

## 2013-08-23 NOTE — Assessment & Plan Note (Signed)
DEXA ordered at last visit to evaluate for role for prolia/forteo.  She is on chronic prednisone tx, s/p bisphosphonate tx for >5 years, so fosamax as d/c at last visit.  She continues to take Vit D + Ca.

## 2013-08-23 NOTE — Assessment & Plan Note (Signed)
With recent falls - PT eval and treat

## 2013-08-23 NOTE — Assessment & Plan Note (Signed)
Lab Results  Component Value Date   HGBA1C 6.8 08/21/2013   HGBA1C 6.2 05/29/2013   HGBA1C 6.5 02/13/2013     Assessment: Diabetes control:  at goal (<8) Progress toward A1C goal:   at goal  Plan: Medications:  diet controlled Home glucose monitoring: as needed if symptomatic Instruction/counseling given: discussed diet Educational resources provided: brochure Self management tools provided: home glucose logbook

## 2013-08-28 NOTE — Progress Notes (Signed)
Case discussed with Dr. Sharda soon after the resident saw the patient.  We reviewed the resident's history and exam and pertinent patient test results.  I agree with the assessment, diagnosis, and plan of care documented in the resident's note. 

## 2013-09-02 ENCOUNTER — Encounter: Payer: Self-pay | Admitting: *Deleted

## 2013-09-20 ENCOUNTER — Ambulatory Visit (HOSPITAL_COMMUNITY)
Admission: RE | Admit: 2013-09-20 | Discharge: 2013-09-20 | Disposition: A | Discharge status: Home / Self Care | Payer: Medicare Other | Source: Ambulatory Visit | Attending: Internal Medicine | Admitting: Internal Medicine

## 2013-09-20 DIAGNOSIS — Z13820 Encounter for screening for osteoporosis: Secondary | ICD-10-CM | POA: Insufficient documentation

## 2013-09-20 DIAGNOSIS — Z780 Asymptomatic menopausal state: Secondary | ICD-10-CM | POA: Insufficient documentation

## 2013-09-20 DIAGNOSIS — M810 Age-related osteoporosis without current pathological fracture: Secondary | ICD-10-CM

## 2013-10-10 ENCOUNTER — Ambulatory Visit: Payer: Medicare Other | Attending: Internal Medicine | Admitting: Physical Therapy

## 2013-10-10 DIAGNOSIS — R262 Difficulty in walking, not elsewhere classified: Secondary | ICD-10-CM | POA: Insufficient documentation

## 2013-10-10 DIAGNOSIS — IMO0001 Reserved for inherently not codable concepts without codable children: Secondary | ICD-10-CM | POA: Insufficient documentation

## 2013-10-29 ENCOUNTER — Ambulatory Visit: Payer: Medicare Other | Admitting: Physical Therapy

## 2014-02-04 ENCOUNTER — Ambulatory Visit (INDEPENDENT_AMBULATORY_CARE_PROVIDER_SITE_OTHER): Payer: Medicare Other | Admitting: Internal Medicine

## 2014-02-04 ENCOUNTER — Encounter: Payer: Self-pay | Admitting: Internal Medicine

## 2014-02-04 VITALS — BP 125/75 | HR 62 | Temp 97.4°F | Wt 193.8 lb

## 2014-02-04 DIAGNOSIS — I4891 Unspecified atrial fibrillation: Secondary | ICD-10-CM

## 2014-02-04 DIAGNOSIS — Z23 Encounter for immunization: Secondary | ICD-10-CM

## 2014-02-04 DIAGNOSIS — E119 Type 2 diabetes mellitus without complications: Secondary | ICD-10-CM

## 2014-02-04 DIAGNOSIS — Z0000 Encounter for general adult medical examination without abnormal findings: Secondary | ICD-10-CM

## 2014-02-04 DIAGNOSIS — M810 Age-related osteoporosis without current pathological fracture: Secondary | ICD-10-CM

## 2014-02-04 DIAGNOSIS — E785 Hyperlipidemia, unspecified: Secondary | ICD-10-CM

## 2014-02-04 DIAGNOSIS — I10 Essential (primary) hypertension: Secondary | ICD-10-CM

## 2014-02-04 DIAGNOSIS — D591 Autoimmune hemolytic anemia, unspecified: Secondary | ICD-10-CM

## 2014-02-04 DIAGNOSIS — R269 Unspecified abnormalities of gait and mobility: Secondary | ICD-10-CM

## 2014-02-04 DIAGNOSIS — J309 Allergic rhinitis, unspecified: Secondary | ICD-10-CM

## 2014-02-04 DIAGNOSIS — Z9189 Other specified personal risk factors, not elsewhere classified: Secondary | ICD-10-CM

## 2014-02-04 DIAGNOSIS — I060 Rheumatic aortic stenosis: Secondary | ICD-10-CM

## 2014-02-04 LAB — GLUCOSE, CAPILLARY: Glucose-Capillary: 142 mg/dL — ABNORMAL HIGH (ref 70–99)

## 2014-02-04 LAB — POCT GLYCOSYLATED HEMOGLOBIN (HGB A1C): Hemoglobin A1C: 7.2

## 2014-02-04 MED ORDER — METOPROLOL TARTRATE 50 MG PO TABS: 50.0000 mg | ORAL_TABLET | Freq: Two times a day (BID) | ORAL | Status: DC

## 2014-02-04 MED ORDER — LOSARTAN POTASSIUM 50 MG PO TABS: 50.0000 mg | ORAL_TABLET | Freq: Every day | ORAL | Status: DC

## 2014-02-04 MED ORDER — PRAVASTATIN SODIUM 40 MG PO TABS: 40.0000 mg | ORAL_TABLET | Freq: Every day | ORAL | Status: DC

## 2014-02-04 MED ORDER — FLUTICASONE PROPIONATE 50 MCG/ACT NA SUSP: 2.0000 | Freq: Every day | NASAL | Status: DC

## 2014-02-04 MED ORDER — CALCIUM CARBONATE-VITAMIN D 600-400 MG-UNIT PO TABS: 1.0000 | ORAL_TABLET | Freq: Three times a day (TID) | ORAL | Status: DC

## 2014-02-04 MED ORDER — ZOSTER VACCINE LIVE 19400 UNT/0.65ML ~~LOC~~ SOLR: 0.6500 mL | Freq: Once | SUBCUTANEOUS | Status: DC

## 2014-02-04 MED ORDER — PREDNISONE 10 MG PO TABS: 10.0000 mg | ORAL_TABLET | Freq: Every day | ORAL | Status: DC

## 2014-02-04 NOTE — Patient Instructions (Signed)
General Instructions: - I am going to send it refills of prednisone, metoprolol, pravastatin, Calcium/Vit D and Flonase.    - Regarding your allergies, use flonase 2 sprays in each nare for one week, then decrease to one spray in each nair.  If in 2 weeks your allergies are still not controlled, try adding over the counter zyrtec daily as well.   Please be sure to bring all of your medications with you to every visit.  Should you have any new or worsening symptoms, please be sure to call the clinic at [phone].    Treatment Goals:  Goals (1 Years of Data) as of 02/04/14         As of Today 08/21/13 05/29/13 02/13/13 09/12/12     Blood Pressure    . Blood Pressure < 140/90  125/75 141/82 123/70 135/81 128/82     Lifestyle    . Prevent Falls           Result Component    . HEMOGLOBIN A1C < 8  7.2 6.8 6.2 6.5 7.1      Progress Toward Treatment Goals:  Treatment Goal 02/04/2014  Hemoglobin A1C at goal  Blood pressure at goal  Prevent falls at goal    Self Care Goals & Plans:  Self Care Goal 08/21/2013  Manage my medications take my medicines as prescribed; refill my medications on time  Monitor my health bring my glucose meter and log to each visit; keep track of my blood glucose  Eat healthy foods drink diet soda or water instead of juice or soda; eat foods that are low in salt; eat baked foods instead of fried foods  Be physically active find an activity I enjoy  Prevent falls use home fall prevention checklist to improve safety; have my vision checked    Home Blood Glucose Monitoring 02/13/2013  Check my blood sugar once a day     Care Management & Community Referrals:  Referral 02/04/2014  Referrals made for care management support none needed

## 2014-02-04 NOTE — Assessment & Plan Note (Signed)
Refilled prednisone today.  No s/s of anemia. Consider CBC at next visit with lipid blood draw.

## 2014-02-04 NOTE — Assessment & Plan Note (Addendum)
Patient presents today for a power wheelchair evaluation. Patient has used a rollator for assistance with ambulation for last 7 years, with 3 falls in 6 months, most recently Sept 2014.  Walking is limited by low back pain d/t tail bone injury.  Pain is 8/10 at worst.  Her timed up and go score was 32.47, suggesting she is a very high fall risk.  She is unable to operate a manual chair due to c/o pain in shoulders. Unable to use a scooter due to large turning radius.  She also has significant joint pain due to her h/o of lupus.    She has already been evaluated by PT and has provided appropriate paperwork.  I have reviewed PT evaluation and agree with findings.

## 2014-02-04 NOTE — Assessment & Plan Note (Signed)
Refilled Ca/Vit D, encouraged her to take this.  She had been treated with bisphosphonate for >5 years so d/c in 05/2013 for drug holiday. Repeat DEXA in 09/2013 revealed lowest T score of -2.4.  May consider prolia/forteo at next visit.

## 2014-02-04 NOTE — Assessment & Plan Note (Signed)
Lab Results  Component Value Date   HGBA1C 7.2 02/04/2014   HGBA1C 6.8 08/21/2013   HGBA1C 6.2 05/29/2013     Assessment: Diabetes control: good control (HgbA1C at goal) (<8) Progress toward A1C goal:  at goal  Plan: Medications:  diet controlled Home glucose monitoring: as needed if symptomatic Instruction/counseling given: reminded to get eye exam, reminded to bring blood glucose meter & log to each visit and reminded to bring medications to each visit

## 2014-02-04 NOTE — Assessment & Plan Note (Addendum)
Script provided for zostavax, but upon realizing that she is on chronic prednisone therapy (chronic immunosuppression), I called to inform patient not to have injection & discard prescription. She voiced understanding (called at 10:30a on 02/04/14)

## 2014-02-04 NOTE — Assessment & Plan Note (Signed)
Refilled metoprolol 

## 2014-02-04 NOTE — Assessment & Plan Note (Signed)
Refilled statin. Lipids at next visit.

## 2014-02-04 NOTE — Assessment & Plan Note (Signed)
BP Readings from Last 3 Encounters:  02/04/14 125/75  08/21/13 141/82  05/29/13 123/70    Lab Results  Component Value Date   NA 144 05/29/2013   K 4.1 05/29/2013   CREATININE 0.78 05/29/2013    Assessment: Blood pressure control: controlled Progress toward BP goal:  at goal Comments: patient only has some of her meds with her, cannot recall if she is taking lasix & losartan  Plan: Medications:   Refilled metoprolol; also refilled losartan but at 50mg  daily instead of 100mg  given SBP 125 (and have been trying to simplify meds); did not refill lasix as I am not sure if she has been taking and SBP 125 today & she was taking this for b/l LE edema d/t venous insuff Other plans: CMET at next visit with lipid draw, limiting blood sticks

## 2014-02-04 NOTE — Progress Notes (Signed)
Subjective:   Patient ID: Stephanie Horne female   DOB: 08-29-1941 73 y.o.   MRN: [id number]  Chief Complaint  Patient presents with  . power wheel chair eval    HPI: [name] is a 73 y.o. woman with history of chronic low back pain due to tail bone injury, afib, lupus, osteoporosis, HLD, DM and autoimmune hemolytic anemia.  I have reviewed and concur with the PT evaluation for power wheelchair.   She requires power wheelchair for getting from room to room in her home.  I believe that patient would benefit from having the power wheelchair in her home, especially to prevent falls, and she agrees to use it consistently in her home.  A scooter is not appropriate for her home environment.    Today she reports problems with her sinuses - nose & eyes run.  She tried afrin but no relief. Has tried claritin in the past without relief.    Needs refill for prednisone, metoprolol and pravastatin, calcium/vit D, flonase  Review of Systems: Constitutional: Denies fever, chills, diaphoresis, appetite change and fatigue.  HEENT: Denies photophobia, eye pain, redness, hearing loss, ear pain, sore throat, mouth sores, trouble swallowing, neck pain, neck stiffness and tinnitus.  Respiratory: Denies SOB, DOE, cough, chest tightness, and wheezing.  Cardiovascular: Denies chest pain, palpitations and leg swelling.  Gastrointestinal: Denies nausea, vomiting, abdominal pain, diarrhea, constipation,blood in stool and abdominal distention.  Genitourinary: Denies dysuria, urgency, frequency, hematuria, flank pain and difficulty urinating.  Musculoskeletal: Denies myalgias, back pain, joint swelling, arthralgias and gait problem.  Skin: Denies pallor, rash and wound.  Neurological: Denies dizziness, seizures, syncope, weakness, lightheadedness, numbness and headaches.   Past Medical History  Diagnosis Date  . Hyperlipidemia   . Hypertension   . Back pain, chronic   . Lupus     Treated with  prednisone indefinitely   . Seasonal allergies   . Mitral regurgitation   . Atrial fibrillation     not on coumidin because AIHA and major bleeding complications  . Axillary lymphadenopathy     left negative MMG 1/08  . AIHA (autoimmune hemolytic anemia)     12/08 onset  . Diabetes mellitus   . Complicated grief 0/25/4270    Death of husband in 2011/01/30   . MITRAL REGURGITATION, MODERATE 10/24/2006    2d Echo from 01/30/08 - EF 60%, Moderated MR     Current Outpatient Prescriptions  Medication Sig Dispense Refill  . acetaminophen (TYLENOL) 500 MG tablet Take 2 tablets (1,000 mg total) by mouth 3 (three) times daily.  100 tablet  2  . aspirin 81 MG EC tablet Take 81 mg by mouth daily.        . Calcium Carbonate-Vitamin D (CALTRATE 600+D) 600-400 MG-UNIT per tablet Take 1 tablet by mouth daily.  30 tablet  5  . diclofenac sodium (VOLTAREN) 1 % GEL Apply 1 application topically 2 (two) times daily as needed. For leg, left hip and foot pain  1 Tube  5  . fluticasone (FLONASE) 50 MCG/ACT nasal spray Place 2 sprays into the nose daily.  16 g  3  . folic acid (FOLVITE) 1 MG tablet Take 1 tablet (1 mg total) by mouth daily.  90 tablet  6  . furosemide (LASIX) 40 MG tablet Take 1 tablet (40 mg total) by mouth daily.  30 tablet  11  . glucose blood (ONE TOUCH ULTRA TEST) test strip As needed  100 each  3  . losartan (  COZAAR) 100 MG tablet Take 1 tablet (100 mg total) by mouth daily.  30 tablet  11  . metoprolol (LOPRESSOR) 50 MG tablet Take 1 tablet (50 mg total) by mouth 2 (two) times daily.  60 tablet  6  . nitroGLYCERIN (NITROSTAT) 0.4 MG SL tablet Place 0.4 mg under the tongue every 5 (five) minutes as needed. For chest pain      . omeprazole (PRILOSEC) 40 MG capsule TAKE 1 CAPSULE (40 MG TOTAL) BY MOUTH DAILY.  30 capsule  3  . Polyethyl Glycol-Propyl Glycol (SYSTANE OP) Place 1 drop into both eyes daily.      . pravastatin (PRAVACHOL) 40 MG tablet TAKE 1 TABLET (40 MG TOTAL) BY MOUTH DAILY.  30  tablet  5  . predniSONE (DELTASONE) 10 MG tablet TAKE 1 TABLET (10 MG TOTAL) BY MOUTH DAILY.  31 tablet  9  . traZODone (DESYREL) 50 MG tablet TAKE 1 TABLET BY MOUTH AT BEDTIME AS NEEDED  60 tablet  1  . [DISCONTINUED] Calcium Carbonate-Vitamin D (CALTRATE 600+D PO) Take 600 mg by mouth daily.       No current facility-administered medications for this visit.   Family History  Problem Relation Age of Onset  . Heart disease Mother    History   Social History  . Marital Status: Married    Spouse Name: N/A    Number of Children: N/A  . Years of Education: N/A   Social History Main Topics  . Smoking status: Former Research scientist (life sciences)  . Smokeless tobacco: None  . Alcohol Use: No  . Drug Use: No  . Sexual Activity: None     Comment: married, retired Animal nutritionist, no smoking, alochol, illegal drug, her husband was clinic patient and died of lung cancer   Other Topics Concern  . None   Social History Narrative  . None    Objective:  Physical Exam: Filed Vitals:   02/04/14 0920  BP: 125/75  Pulse: 62  Temp: 97.4 F (36.3 C)  TempSrc: Oral  Weight: 193 lb 12.8 oz (87.907 kg)  SpO2: 95%   General: pleasant, in wheelchair, no disturess HEENT: PERRL, EOMI, no scleral icterus Cardiac: RRR, no rubs, murmurs or gallops Pulm: clear to auscultation bilaterally, moving normal volumes of air Abd: soft, nontender, nondistended, BS normoactive Ext: warm and well perfused Neuro: alert and oriented X3, cranial nerves II-XII grossly intact  Assessment & Plan:  Case and care discussed with Dr. Marinda Elk.  Please see problem oriented charting for further details. Patient to return in 3 months for routine visit - will need labs at that time.

## 2014-02-06 NOTE — Progress Notes (Addendum)
Case discussed with Dr. Sharda soon after the resident saw the patient.  We reviewed the resident's history and exam and pertinent patient test results.  I agree with the assessment, diagnosis, and plan of care documented in the resident's note. 

## 2014-06-04 ENCOUNTER — Ambulatory Visit (HOSPITAL_COMMUNITY)
Admission: RE | Admit: 2014-06-04 | Discharge: 2014-06-04 | Disposition: A | Discharge status: Home / Self Care | Payer: Medicare Other | Source: Ambulatory Visit | Attending: Internal Medicine | Admitting: Internal Medicine

## 2014-06-04 ENCOUNTER — Encounter: Payer: Self-pay | Admitting: Internal Medicine

## 2014-06-04 ENCOUNTER — Ambulatory Visit (INDEPENDENT_AMBULATORY_CARE_PROVIDER_SITE_OTHER): Payer: Medicare Other | Admitting: Internal Medicine

## 2014-06-04 VITALS — BP 160/97 | HR 75 | Temp 97.5°F | Ht 70.0 in | Wt 191.9 lb

## 2014-06-04 DIAGNOSIS — R0609 Other forms of dyspnea: Secondary | ICD-10-CM | POA: Diagnosis present

## 2014-06-04 DIAGNOSIS — R0989 Other specified symptoms and signs involving the circulatory and respiratory systems: Secondary | ICD-10-CM | POA: Diagnosis present

## 2014-06-04 DIAGNOSIS — M810 Age-related osteoporosis without current pathological fracture: Secondary | ICD-10-CM

## 2014-06-04 DIAGNOSIS — I517 Cardiomegaly: Secondary | ICD-10-CM | POA: Diagnosis not present

## 2014-06-04 DIAGNOSIS — E785 Hyperlipidemia, unspecified: Secondary | ICD-10-CM

## 2014-06-04 DIAGNOSIS — D591 Autoimmune hemolytic anemia, unspecified: Secondary | ICD-10-CM

## 2014-06-04 DIAGNOSIS — I060 Rheumatic aortic stenosis: Secondary | ICD-10-CM

## 2014-06-04 DIAGNOSIS — Z23 Encounter for immunization: Secondary | ICD-10-CM

## 2014-06-04 DIAGNOSIS — I10 Essential (primary) hypertension: Secondary | ICD-10-CM

## 2014-06-04 DIAGNOSIS — E119 Type 2 diabetes mellitus without complications: Secondary | ICD-10-CM

## 2014-06-04 LAB — GLUCOSE, CAPILLARY: Glucose-Capillary: 158 mg/dL — ABNORMAL HIGH (ref 70–99)

## 2014-06-04 LAB — CBC
HEMATOCRIT: 38.9 % (ref 36.0–46.0)
HEMOGLOBIN: 13.3 g/dL (ref 12.0–15.0)
MCH: 31.2 pg (ref 26.0–34.0)
MCHC: 34.2 g/dL (ref 30.0–36.0)
MCV: 91.3 fL (ref 78.0–100.0)
Platelets: 151 10*3/uL (ref 150–400)
RBC: 4.26 MIL/uL (ref 3.87–5.11)
RDW: 14.1 % (ref 11.5–15.5)
WBC: 4.9 10*3/uL (ref 4.0–10.5)

## 2014-06-04 LAB — POCT GLYCOSYLATED HEMOGLOBIN (HGB A1C): Hemoglobin A1C: 7.2

## 2014-06-04 NOTE — Assessment & Plan Note (Signed)
Reports compliance with statin.  Lipid panel check today.

## 2014-06-04 NOTE — Assessment & Plan Note (Addendum)
BP Readings from Last 3 Encounters:  06/04/14 160/97  02/04/14 125/75  08/21/13 141/82    Lab Results  Component Value Date   NA 144 05/29/2013   K 4.1 05/29/2013   CREATININE 0.78 05/29/2013    Assessment: Blood pressure control:  elevated Progress toward BP goal:   deteriorated Comments: Her losartan dose was decreased at last visit due to normotension.  She may require the dose be increased.  I would like to check CMP first and then adjust med accordingly.  Plan: Medications:  continue current medications: losartan 50mg  daily, Lasix 40mg  daily.  I will make med adjustments once I have results of CMP. Educational resources provided: brochure Other plans: I will call patient with med changes.  She is agreeable.  She will return in 3 months for follow-up.  06/06/2014 Addendum:  Labs reviewed.  Renal function, electrolytes normal.  Will increase Losartan to 100mg  daily.  I spoke with Ms. Ghazarian on the phone and she agrees to the increase.  She does not own a BP monitor.  I asked her to call the clinic if she experienced any new symptoms with the increase, particularly lightheadedness.  I think she will tolerate as she has been on this dose before without problem.  I have asked her to return to clinic in 2 weeks for BP re-check and BMP on increased dose.

## 2014-06-04 NOTE — Assessment & Plan Note (Signed)
Lab Results  Component Value Date   HGBA1C 7.2 06/04/2014   HGBA1C 7.2 02/04/2014   HGBA1C 6.8 08/21/2013     Assessment: Diabetes control:  well controlled  Progress toward A1C goal:   at goal  Plan: Medications:  Diet controlled Home glucose monitoring: as needed Instruction/counseling given: reminded to bring blood glucose meter & log to each visit Educational resources provided: brochure Self management tools provided: copy of home glucose meter download;home glucose logbook

## 2014-06-04 NOTE — Progress Notes (Signed)
   Subjective:    Patient ID: ARAH ARO, female    DOB: 1941/08/10, 73 y.o.   MRN: [id number]  HPI Comments: Mr. Chim is a 73 year old woman with a PMH of HTN, mitral regurgitation, Afib (not on coumadin due to prior GIB on coumadin), DM (Hgb A1c 7.01 Feb 2014), SLE, autoimmune hemolytic anemia and osteoporosis who presents for follow-up of her chronic medical conditions.  Please see problem based assessment and plan for an update of these conditions.     Review of Systems  Constitutional: Negative for chills, appetite change, fatigue and unexpected weight change.  Eyes: Negative for visual disturbance.  Respiratory: Negative for cough and shortness of breath.        + DOE  Cardiovascular: Positive for leg swelling. Negative for chest pain and palpitations.  Gastrointestinal: Negative for nausea, vomiting, abdominal pain, diarrhea, constipation and blood in stool.  Endocrine: Negative for polydipsia and polyuria.  Genitourinary: Negative for dysuria and hematuria.  Neurological: Positive for headaches. Negative for syncope, weakness and light-headedness.       Occasional, not the worse ever, resolve on their own  Hematological: Does not bruise/bleed easily.       Objective:   Physical Exam  Vitals reviewed. Constitutional: She is oriented to person, place, and time. She appears well-developed. No distress.  HENT:  Head: Normocephalic and atraumatic.  Mouth/Throat: Oropharynx is clear and moist. No oropharyngeal exudate.  Eyes: EOM are normal. Pupils are equal, round, and reactive to light. Right eye exhibits no discharge. Left eye exhibits no discharge. No scleral icterus.  Neck: Normal range of motion. Neck supple.  Cardiovascular: Normal rate, regular rhythm and intact distal pulses.  Exam reveals no gallop and no friction rub.   Murmur heard. + systolic murmur  Pulmonary/Chest: Effort normal and breath sounds normal. No respiratory distress. She has no wheezes. She has no  rales.  Abdominal: Soft. Bowel sounds are normal. She exhibits no distension. There is no tenderness.  Musculoskeletal: Normal range of motion. She exhibits edema. She exhibits no tenderness.  1+ B/L LE edema  Neurological: She is alert and oriented to person, place, and time. No cranial nerve deficit.  Skin: Skin is warm. She is not diaphoretic.  Psychiatric: She has a normal mood and affect. Her behavior is normal.          Assessment & Plan:  Please see problem based assessment and plan.

## 2014-06-04 NOTE — Assessment & Plan Note (Addendum)
Patients reports DOE for the past few months, worse in the past 3 months.  She is not dyspneic at rest.  The dyspnea responds to rest.  She denies orthopnea, PND or increased lower extremity.  She has remote hx of smoking, decades ago.  No hx of COPD or asthma.   - will obtain CXR and repeat ECHO

## 2014-06-04 NOTE — Patient Instructions (Signed)
General Instructions:  1. We will call you with appointment for 2D ECHO.  After I get your labs back I will call you with instructions for blood pressure medication.   2. Please take all medications as prescribed.    3. If you have worsening of your symptoms or new symptoms arise, please call the clinic ([phone]), or go to the ER immediately if symptoms are severe.  Please come back to see me in 3 months or sooner if needed.    Progress Toward Treatment Goals:  Treatment Goal 02/04/2014  Hemoglobin A1C at goal  Blood pressure at goal  Prevent falls at goal    Self Care Goals & Plans:  Self Care Goal 06/04/2014  Manage my medications take my medicines as prescribed; bring my medications to every visit; refill my medications on time  Monitor my health keep track of my blood glucose; bring my glucose meter and log to each visit  Eat healthy foods drink diet soda or water instead of juice or soda; eat more vegetables; eat foods that are low in salt; eat baked foods instead of fried foods; eat fruit for snacks and desserts  Be physically active -  Prevent falls -    Home Blood Glucose Monitoring 02/13/2013  Check my blood sugar once a day     Care Management & Community Referrals:  Referral 02/04/2014  Referrals made for care management support none needed

## 2014-06-04 NOTE — Assessment & Plan Note (Signed)
She did not get Zostavax because live vaccine is contraindicated in her immune-suppressed state.  Flu shot provided today.

## 2014-06-04 NOTE — Assessment & Plan Note (Signed)
She is compliant with prednisone.  Anemia could be contributing to DOE.  CBC check today.

## 2014-06-04 NOTE — Assessment & Plan Note (Signed)
She is agreeable to restarting treatment for osteoporosis.  I will await labs, 2D ECHO, review ADR profiles and we can decide on a med by next visit.  She is compliant with vit D/Calcium.

## 2014-06-05 LAB — COMPLETE METABOLIC PANEL WITH GFR
ALK PHOS: 56 U/L (ref 39–117)
ALT: 14 U/L (ref 0–35)
AST: 19 U/L (ref 0–37)
Albumin: 4.4 g/dL (ref 3.5–5.2)
BILIRUBIN TOTAL: 1 mg/dL (ref 0.2–1.2)
BUN: 8 mg/dL (ref 6–23)
CO2: 28 meq/L (ref 19–32)
Calcium: 10.1 mg/dL (ref 8.4–10.5)
Chloride: 106 mEq/L (ref 96–112)
Creat: 0.73 mg/dL (ref 0.50–1.10)
GFR, Est African American: >89 mL/min
GFR, Est Non African American: 81 mL/min
Glucose, Bld: 154 mg/dL — ABNORMAL HIGH (ref 70–99)
Potassium: 3.8 mEq/L (ref 3.5–5.3)
SODIUM: 142 meq/L (ref 135–145)
TOTAL PROTEIN: 7.3 g/dL (ref 6.0–8.3)

## 2014-06-05 LAB — LIPID PANEL
CHOL/HDL RATIO: 3.1 ratio
Cholesterol: 148 mg/dL (ref 0–200)
HDL: 47 mg/dL (ref >39)
LDL Cholesterol: 72 mg/dL (ref 0–99)
Triglycerides: 147 mg/dL (ref <150)
VLDL: 29 mg/dL (ref 0–40)

## 2014-06-06 ENCOUNTER — Other Ambulatory Visit: Payer: Self-pay | Admitting: Internal Medicine

## 2014-06-06 MED ORDER — LOSARTAN POTASSIUM 100 MG PO TABS: 100.0000 mg | ORAL_TABLET | Freq: Every day | ORAL | Status: DC

## 2014-06-06 NOTE — Addendum Note (Signed)
Addended by: Francesca Oman on: 06/06/2014 09:53 AM   Modules accepted: Orders, Level of Service

## 2014-06-11 NOTE — Progress Notes (Signed)
Internal Medicine Clinic Attending Date of visit: 06/04/2014   Case discussed with Dr. Redmond Pulling soon after the resident saw the patient.  We reviewed the resident's history and exam and pertinent patient test results.  I agree with the assessment, diagnosis, and plan of care documented in the resident's note.

## 2014-06-16 LAB — HM DIABETES EYE EXAM

## 2014-06-18 ENCOUNTER — Encounter: Payer: Self-pay | Admitting: Internal Medicine

## 2014-06-18 ENCOUNTER — Encounter: Payer: Self-pay | Admitting: *Deleted

## 2014-06-18 ENCOUNTER — Ambulatory Visit (INDEPENDENT_AMBULATORY_CARE_PROVIDER_SITE_OTHER): Payer: Medicare Other | Admitting: Internal Medicine

## 2014-06-18 ENCOUNTER — Encounter: Payer: Medicare Other | Admitting: Internal Medicine

## 2014-06-18 VITALS — BP 152/66 | HR 71 | Temp 98.3°F | Ht 70.0 in | Wt 196.5 lb

## 2014-06-18 DIAGNOSIS — I10 Essential (primary) hypertension: Secondary | ICD-10-CM

## 2014-06-18 DIAGNOSIS — E119 Type 2 diabetes mellitus without complications: Secondary | ICD-10-CM

## 2014-06-18 LAB — GLUCOSE, CAPILLARY: Glucose-Capillary: 138 mg/dL — ABNORMAL HIGH (ref 70–99)

## 2014-06-18 NOTE — Patient Instructions (Signed)
1. We will call you with appointment information for ECHO.     2. Please take all medications as prescribed.  Please take metoprolol 50mg  twice per day.   3. If you have worsening of your symptoms or new symptoms arise, please call the clinic ([phone]), or go to the ER immediately if symptoms are severe.

## 2014-06-18 NOTE — Progress Notes (Signed)
   Subjective:    Patient ID: Stephanie Horne, female    DOB: Jun 14, 1941, 73 y.o.   MRN: [id number]  HPI Comments: Mr. Stephanie Horne is a 73 year old woman with a PMH of HTN, mitral regurgitation, Afib (not on coumadin due to prior GIB on coumadin), DM (Hgb A1c 7.01 Feb 2014), SLE, autoimmune hemolytic anemia and osteoporosis here for BP follow-up.       Review of Systems  Constitutional: Negative for appetite change.  Eyes: Negative for visual disturbance.  Cardiovascular: Negative for chest pain.  Gastrointestinal: Negative for nausea, vomiting and abdominal pain.  Genitourinary: Negative for dysuria.       Objective:   Physical Exam  Vitals reviewed. Constitutional: She is oriented to person, place, and time. She appears well-developed. No distress.  HENT:  Head: Normocephalic and atraumatic.  Mouth/Throat: No oropharyngeal exudate.  Eyes: EOM are normal.  Cardiovascular: Normal rate and normal heart sounds.  Exam reveals no gallop and no friction rub.   No murmur heard. irregular  Pulmonary/Chest: Effort normal and breath sounds normal. No respiratory distress. She has no wheezes. She has no rales.  Abdominal: Soft. Bowel sounds are normal. She exhibits no distension. There is no tenderness.  Musculoskeletal: Normal range of motion. She exhibits edema. She exhibits no tenderness.  Trace lower extremity edema.  Neurological: She is alert and oriented to person, place, and time. No cranial nerve deficit.  Skin: Skin is warm. She is not diaphoretic.  Psychiatric: She has a normal mood and affect. Her behavior is normal.          Assessment & Plan:  Please see problem based assessment and plan.

## 2014-06-18 NOTE — Assessment & Plan Note (Signed)
BP Readings from Last 3 Encounters:  06/18/14 152/66  06/04/14 160/97  02/04/14 125/75    Lab Results  Component Value Date   NA 142 06/04/2014   K 3.8 06/04/2014   CREATININE 0.73 06/04/2014    Assessment: Blood pressure control:  not controlled Progress toward BP goal:   improving Comments: Ms. Groesbeck thought the Toprol was changed to once daily so she has only been taking it once daily since her last appt with Dr. Burnard Bunting (01/2014).    Plan: Medications:  continue current medications:  Losartan 100mg  daily, Lasix 40mg  daily.  RESUME metoprolol 50mg  BID. Educational resources provided: brochure Self management tools provided: home blood pressure logbook Other plans: BMP today; return to clinic for follow-up in 3 months

## 2014-06-19 LAB — BASIC METABOLIC PANEL WITH GFR
BUN: 12 mg/dL (ref 6–23)
CHLORIDE: 109 meq/L (ref 96–112)
CO2: 26 mEq/L (ref 19–32)
CREATININE: 0.62 mg/dL (ref 0.50–1.10)
Calcium: 9.9 mg/dL (ref 8.4–10.5)
GFR, Est African American: >89 mL/min
GFR, Est Non African American: 88 mL/min
Glucose, Bld: 122 mg/dL — ABNORMAL HIGH (ref 70–99)
Potassium: 3.5 mEq/L (ref 3.5–5.3)
Sodium: 145 mEq/L (ref 135–145)

## 2014-06-19 NOTE — Progress Notes (Signed)
INTERNAL MEDICINE TEACHING ATTENDING ADDENDUM - Ameer Sanden, MD: I reviewed and discussed at the time of visit with the resident Dr. Wilson, the patient's medical history, physical examination, diagnosis and results of pertinent tests and treatment and I agree with the patient's care as documented.  

## 2014-07-07 ENCOUNTER — Telehealth: Payer: Self-pay | Admitting: *Deleted

## 2014-07-07 NOTE — Telephone Encounter (Signed)
Call to patient message left with Granddaughter about appointment with 2 D Echo on 07/08/2014 3:00 PM to arrive by 2:45 PM in Admitting. Granddaughter to have patient call for questions.  Sander Nephew, RN 07/07/2014 11:45 AM.

## 2014-07-07 NOTE — Telephone Encounter (Signed)
RTC from patient unable to keep appointment on 07/08/2014 for Echo.  No transportation available.  Rescheduled for 07/08/2014 at 9:00 AM pt to arrive by 9:45 AM. Sander Nephew, RN 07/07/2014 3:54 PM.

## 2014-07-08 ENCOUNTER — Ambulatory Visit (HOSPITAL_COMMUNITY): Payer: Medicare Other

## 2014-07-08 NOTE — Telephone Encounter (Signed)
Call to patient to reschedule appointment for 07/20/2014.  [employer] has not yet Prior Authorized the visit for the Echo will not make a decision until 07/14/2014 per C. Boone.. Patient has been rescheduled for 07/17/2014 at 10:00 AM to arrive by 9:45 AM.  Pt voiced understanding  of the plan.  Sander Nephew, RN 07/08/2014 4:45 PM.

## 2014-07-10 ENCOUNTER — Other Ambulatory Visit (HOSPITAL_COMMUNITY): Payer: Medicare Other

## 2014-07-16 ENCOUNTER — Other Ambulatory Visit (HOSPITAL_COMMUNITY): Payer: Self-pay | Admitting: Internal Medicine

## 2014-07-17 ENCOUNTER — Other Ambulatory Visit (HOSPITAL_COMMUNITY): Payer: Self-pay | Admitting: Internal Medicine

## 2014-07-17 ENCOUNTER — Ambulatory Visit (HOSPITAL_COMMUNITY)
Admission: RE | Admit: 2014-07-17 | Discharge: 2014-07-17 | Disposition: A | Discharge status: Home / Self Care | Payer: Medicare Other | Source: Ambulatory Visit | Attending: Internal Medicine | Admitting: Internal Medicine

## 2014-07-17 DIAGNOSIS — I10 Essential (primary) hypertension: Secondary | ICD-10-CM | POA: Diagnosis not present

## 2014-07-17 DIAGNOSIS — R0600 Dyspnea, unspecified: Secondary | ICD-10-CM

## 2014-07-17 DIAGNOSIS — R0609 Other forms of dyspnea: Secondary | ICD-10-CM

## 2014-07-17 DIAGNOSIS — E785 Hyperlipidemia, unspecified: Secondary | ICD-10-CM | POA: Insufficient documentation

## 2014-07-17 DIAGNOSIS — E119 Type 2 diabetes mellitus without complications: Secondary | ICD-10-CM | POA: Diagnosis not present

## 2014-07-17 NOTE — Progress Notes (Signed)
  Echocardiogram 2D Echocardiogram has been performed.  Donata Clay 07/17/2014, 11:01 AM

## 2014-07-24 ENCOUNTER — Other Ambulatory Visit: Payer: Self-pay | Admitting: Internal Medicine

## 2014-07-24 ENCOUNTER — Telehealth: Payer: Self-pay | Admitting: Internal Medicine

## 2014-07-24 DIAGNOSIS — R0609 Other forms of dyspnea: Principal | ICD-10-CM

## 2014-07-24 DIAGNOSIS — I2721 Secondary pulmonary arterial hypertension: Secondary | ICD-10-CM

## 2014-07-24 NOTE — Assessment & Plan Note (Signed)
07/17/2014 2D ECHO:  EF 60-65%.  Normal LV size and systolic function. Elevated filling pressures. Severe bi-atrial dilatation. Anterior mitral valve leaflet prolapse with severe mitral regurgitation. Mildly impaired RV systolic function. Severe pulmonary hypertension.  She has no known lung disease.  I will refer her to cardiology for further evaluation.  I have called the patient and she is agreeable to the plan.

## 2014-07-24 NOTE — Telephone Encounter (Signed)
I called Stephanie Horne to inform her of her ECHO results.  Because of the severe MR and high PA pressure I will refer her to Cardiology for further work-up.  She is agreeable to this plan.

## 2014-08-14 ENCOUNTER — Other Ambulatory Visit: Payer: Self-pay | Admitting: Internal Medicine

## 2014-08-15 ENCOUNTER — Other Ambulatory Visit: Payer: Self-pay | Admitting: Internal Medicine

## 2014-09-01 ENCOUNTER — Institutional Professional Consult (permissible substitution): Payer: Medicare Other | Admitting: Cardiology

## 2014-09-24 ENCOUNTER — Other Ambulatory Visit: Payer: Self-pay | Admitting: Internal Medicine

## 2014-09-24 MED ORDER — METOPROLOL TARTRATE 50 MG PO TABS: 50.0000 mg | ORAL_TABLET | Freq: Two times a day (BID) | ORAL | Status: DC

## 2014-10-08 ENCOUNTER — Ambulatory Visit (INDEPENDENT_AMBULATORY_CARE_PROVIDER_SITE_OTHER): Payer: Medicare Other | Admitting: Internal Medicine

## 2014-10-08 ENCOUNTER — Encounter: Payer: Self-pay | Admitting: Internal Medicine

## 2014-10-08 VITALS — BP 148/88 | HR 67 | Temp 97.9°F | Ht 70.5 in | Wt 190.0 lb

## 2014-10-08 DIAGNOSIS — R0609 Other forms of dyspnea: Secondary | ICD-10-CM | POA: Diagnosis not present

## 2014-10-08 DIAGNOSIS — E119 Type 2 diabetes mellitus without complications: Secondary | ICD-10-CM | POA: Diagnosis not present

## 2014-10-08 DIAGNOSIS — R609 Edema, unspecified: Secondary | ICD-10-CM

## 2014-10-08 DIAGNOSIS — M810 Age-related osteoporosis without current pathological fracture: Secondary | ICD-10-CM

## 2014-10-08 DIAGNOSIS — Z7952 Long term (current) use of systemic steroids: Secondary | ICD-10-CM

## 2014-10-08 DIAGNOSIS — G4700 Insomnia, unspecified: Secondary | ICD-10-CM

## 2014-10-08 DIAGNOSIS — I4891 Unspecified atrial fibrillation: Secondary | ICD-10-CM

## 2014-10-08 DIAGNOSIS — M329 Systemic lupus erythematosus, unspecified: Secondary | ICD-10-CM

## 2014-10-08 DIAGNOSIS — R600 Localized edema: Secondary | ICD-10-CM

## 2014-10-08 DIAGNOSIS — Z7982 Long term (current) use of aspirin: Secondary | ICD-10-CM

## 2014-10-08 DIAGNOSIS — I10 Essential (primary) hypertension: Secondary | ICD-10-CM | POA: Diagnosis not present

## 2014-10-08 LAB — POCT GLYCOSYLATED HEMOGLOBIN (HGB A1C): Hemoglobin A1C: 7.3

## 2014-10-08 LAB — GLUCOSE, CAPILLARY: Glucose-Capillary: 155 mg/dL — ABNORMAL HIGH (ref 70–99)

## 2014-10-08 MED ORDER — TRAZODONE HCL 50 MG PO TABS: 50.0000 mg | ORAL_TABLET | Freq: Every evening | ORAL | Status: DC | PRN

## 2014-10-08 MED ORDER — FUROSEMIDE 40 MG PO TABS: 40.0000 mg | ORAL_TABLET | Freq: Every day | ORAL | Status: DC

## 2014-10-08 MED ORDER — ONETOUCH DELICA LANCETS FINE MISC
1.0000 | Freq: Every day | Status: DC
Start: 2014-10-08 — End: 2016-05-05

## 2014-10-08 NOTE — Patient Instructions (Signed)
General Instructions:  Please keep your appointment with the cardiologist on 10/10/2014.  Return to see me in 3 months or sooner if you have problems.  Thank you for bringing your medicines today. This helps [place] keep you safe from mistakes.   Progress Toward Treatment Goals:  Treatment Goal 10/08/2014  Hemoglobin A1C unchanged  Blood pressure at goal  Prevent falls -    Self Care Goals & Plans:  Self Care Goal 10/08/2014  Manage my medications take my medicines as prescribed; bring my medications to every visit; refill my medications on time  Monitor my health -  Eat healthy foods drink diet soda or water instead of juice or soda; eat more vegetables; eat foods that are low in salt; eat baked foods instead of fried foods; eat fruit for snacks and desserts  Be physically active -  Prevent falls -    Home Blood Glucose Monitoring 10/08/2014  Check my blood sugar once a day  When to check my blood sugar before breakfast     Care Management & Community Referrals:  Referral 02/04/2014  Referrals made for care management support none needed

## 2014-10-08 NOTE — Assessment & Plan Note (Addendum)
B/L leg swelling improving, trace today.  She denies orthopnea or PND.  2D ECHO revealed severed MR and elevated PA pressure. Will see cardiology on 10/10/14.

## 2014-10-08 NOTE — Assessment & Plan Note (Addendum)
Continues to experience dyspnea with exertion.  Occurs about every other day, related more with movement and having to travel with her ill son to doctor's visits, etc.  Resolves with rest.  No chest pain, lightheadedness or syncope.  She is comfortable today.  2D ECHO revealed EF 60-65%, severed MR and elevated PA pressure.  She will be evaluated by Cardiology on 10/10/14.

## 2014-10-08 NOTE — Assessment & Plan Note (Addendum)
She has not been using Trazodone and continues to use Benadryl 3 nights per week.  Melatonin has not worked for her in the past.  She says she needs the noise of the tv at night.  She is willing to try Trazodone again.  - rx refilled for Trazodone 50mg  qHS prn  - advised d/c screen time prior to bed

## 2014-10-08 NOTE — Assessment & Plan Note (Addendum)
BP Readings from Last 3 Encounters:  10/08/14 148/88  06/18/14 152/66  06/04/14 160/97    Lab Results  Component Value Date   NA 145 06/18/2014   K 3.5 06/18/2014   CREATININE 0.62 06/18/2014    Assessment: Blood pressure control:  well controlled Progress toward BP goal:   at goal Comments: Reports med compliance, however, per [facility] pharmacist Lasix has not been filled recently.  The patient does not have Lasix bottle with her.  She says she took it just prior to leaving home and left the bottle.  She says she fills her meds at the same pharmacy.  She refers to the med as furosemide so I know she is aware of which med it is.  She is reliable and I do not think she would say she was taking it if she did not think she was.     Plan: Medications:  continue current medications: metoprolol 50mg  BID, Lasix 40mg  daily, losartan 100mg  daily. Educational resources provided: brochure Self management tools provided:   Other plans: RTC in 3 months.  Will check BMP at that time.

## 2014-10-08 NOTE — Assessment & Plan Note (Addendum)
Compliant with prednisone 10mg  daily.  She denies rashes, arthritis.  She has not seen a rheumatologist is several years.  Urine microalb/Cr completed this visit to assess for proteinuria (given DM dx) is elevated.  This may be a result of known DM or HTN, however will need to consider lupus nephritis as a possible cause.  - will refer to rheumatology for further evaluation - continue prednisone 10mg  daily - continue losartan 100mg  daily

## 2014-10-08 NOTE — Progress Notes (Signed)
   Subjective:    Patient ID: Stephanie Horne, female    DOB: [birth year], 74 y.o.   MRN: [id number]  HPI Comments: Mr. Wiegand is a 74 year old woman with a PMH of HTN, mitral regurgitation, Afib (not on coumadin due to prior GIB on coumadin), DM (Hgb A1c 7.2 Sept 2015), SLE, autoimmune hemolytic anemia and osteoporosis who presents for follow-up of her chronic medical conditions. Please see problem based assessment and plan for an update of these conditions.     Review of Systems  Constitutional: Negative for fever, chills, appetite change and unexpected weight change.  Respiratory: Positive for shortness of breath.   Cardiovascular: Negative for chest pain, palpitations and leg swelling.  Gastrointestinal: Negative for nausea, vomiting, abdominal pain, diarrhea, constipation and blood in stool.  Endocrine: Negative for polydipsia and polyuria.  Genitourinary: Negative for dysuria, hematuria, vaginal bleeding and vaginal discharge.  Neurological: Negative for syncope, weakness and light-headedness.       Objective:   Physical Exam  Constitutional: She is oriented to person, place, and time. She appears well-developed. No distress.  HENT:  Head: Normocephalic and atraumatic.  Mouth/Throat: Oropharynx is clear and moist. No oropharyngeal exudate.  Eyes: EOM are normal. Pupils are equal, round, and reactive to light.  Neck: Normal range of motion.  Cardiovascular: Normal rate.  Exam reveals no gallop and no friction rub.   No murmur heard. irregular  Pulmonary/Chest: Effort normal and breath sounds normal. No respiratory distress. She has no wheezes. She has no rales.  Abdominal: Soft. Bowel sounds are normal. She exhibits no distension. There is no tenderness. There is no rebound.  Musculoskeletal: She exhibits edema. She exhibits no tenderness.  Trace B/L lower extremity edema  Neurological: She is alert and oriented to person, place, and time. No cranial nerve deficit.  B/L feet -  sensation intact, 2+ dorsalis pedis pulses, normal toe proprioception and achilles  Skin: Skin is warm. She is not diaphoretic.  Psychiatric: She has a normal mood and affect. Her behavior is normal.  Vitals reviewed.         Assessment & Plan:  Please see problem based assessment and plan.

## 2014-10-08 NOTE — Assessment & Plan Note (Addendum)
Lab Results  Component Value Date   HGBA1C 7.3 10/08/2014   HGBA1C 7.2 06/04/2014   HGBA1C 7.2 02/04/2014     Assessment: Diabetes control:  well controlled Progress toward A1C goal:   at goal Comments: Logs reviewed. Avg CBG 119. CBGs 99-136.   Plan: Medications: diet control Home glucose monitoring: Frequency:   Timing:   Instruction/counseling given: discussed diet. I told her she does not have to test her CBG but she would like to continue testing once daily for her own peace of mind so will continue with that schedule.  Educational resources provided: brochure (has information) Self management tools provided: copy of home glucose meter download, home glucose logbook Other plans: Continue diet control.  Foot exam today.  Check urine microalb/Cr today.  Return for follow-up in 3 months.

## 2014-10-09 ENCOUNTER — Other Ambulatory Visit: Payer: Self-pay | Admitting: Internal Medicine

## 2014-10-09 DIAGNOSIS — I340 Nonrheumatic mitral (valve) insufficiency: Secondary | ICD-10-CM | POA: Insufficient documentation

## 2014-10-09 DIAGNOSIS — I272 Pulmonary hypertension, unspecified: Secondary | ICD-10-CM | POA: Insufficient documentation

## 2014-10-09 LAB — MICROALBUMIN / CREATININE URINE RATIO
CREATININE, URINE: 198.1 mg/dL
MICROALB UR: 6 mg/dL — AB (ref <2.0)
Microalb Creat Ratio: 30.3 mg/g — ABNORMAL HIGH (ref 0.0–30.0)

## 2014-10-09 MED ORDER — PREDNISONE 10 MG PO TABS: 10.0000 mg | ORAL_TABLET | Freq: Every day | ORAL | Status: DC

## 2014-10-09 NOTE — Assessment & Plan Note (Signed)
She is currently on drug holiday from Fosamax.  She will be eligible for repeat DEXA in 05/2015.  She is compliant with Vit D/calcium (she has brought the bottle along with other meds today).  I will hold off on re-initiating therapy and wait until after DEXA in 05/2015.  Continue VitD/Calcium for now.   Patient is agreeable to this plan.

## 2014-10-09 NOTE — Assessment & Plan Note (Addendum)
Rate controlled.  Compliant with metoprolol and ASA.

## 2014-10-10 ENCOUNTER — Encounter: Payer: Self-pay | Admitting: Cardiovascular Disease

## 2014-10-10 ENCOUNTER — Ambulatory Visit (INDEPENDENT_AMBULATORY_CARE_PROVIDER_SITE_OTHER): Payer: Medicare Other | Admitting: Cardiovascular Disease

## 2014-10-10 VITALS — BP 152/84 | HR 78 | Ht 70.5 in | Wt 193.6 lb

## 2014-10-10 DIAGNOSIS — I481 Persistent atrial fibrillation: Secondary | ICD-10-CM | POA: Diagnosis not present

## 2014-10-10 DIAGNOSIS — I4819 Other persistent atrial fibrillation: Secondary | ICD-10-CM

## 2014-10-10 DIAGNOSIS — I340 Nonrheumatic mitral (valve) insufficiency: Secondary | ICD-10-CM

## 2014-10-10 NOTE — Progress Notes (Signed)
Background: the patient is followed for permanent atrial fibrillation, mitral valve prolapse with mitral regurgitation, and hypertension. She's been noncompliant with warfarin in the past and this was discontinued several years ago. She's also had bleeding problems.  HPI:  74 year old woman presenting for follow-up evaluation. She has not been seen since 2013. The patient has been widowed now for about 3 years. Her mobility is quite limited. She has developed fairly marked shortness of breath with low-level activity. She ambulates with a walker because of generalized weakness and this allows her to stop and rest frequently. She describes slowly progressive shortness of breath with activity over the past year. She denies chest pain or pressure, lightheadedness, or syncope. She admits to leg swelling. The patient had an echocardiogram in October 2015 demonstrating normal LV function, but worsening of mitral regurgitation now in the severe range, as well as severe pulmonary hypertension. She presents today with her daughter for further discussion.  Studies:  2-D echocardiogram 07/17/2014: ------------------------------------------------------------------- Study Conclusions  - Left ventricle: The cavity size was normal. There was mild focal basal hypertrophy of the septum. Systolic function was normal. The estimated ejection fraction was in the range of 60% to 65%. Wall motion was normal; there were no regional wall motion abnormalities. Doppler parameters are consistent with elevated ventricular end-diastolic filling pressure. - Aortic valve: Trileaflet; normal thickness leaflets. There was no regurgitation. - Aortic root: The aortic root was normal in size. - Mitral valve: Myxomatous anterior leaflet of the mitral valve with prolapse. There is at least moderate and most probably severe mitral regurgitation with posteriorly directed jet. Transvalvular velocity was within the  normal range. There was no evidence for stenosis. - Left atrium: The atrium was severely dilated. - Right ventricle: Systolic function was mildly reduced. - Right atrium: The atrium was severely dilated. - Tricuspid valve: There was mild-moderate regurgitation. - Pulmonary arteries: Systolic pressure was severely increased. PA peak pressure: 62 mm Hg (S). - Pericardium, extracardiac: There was no pericardial effusion.  Impressions:  - Normal LV size and systolic function. Elevated filling pressures. Severe bi-atrial dilatation. Anterior mitral valve leaflet prolapse with severe mitral regurgitation. Mildly impaired RV systolic function. Severe pulmonary hypertension.  Outpatient Encounter Prescriptions as of 10/10/2014  Medication Sig  . aspirin 81 MG EC tablet Take 81 mg by mouth daily.    . Calcium Carbonate-Vitamin D (CALTRATE 600+D) 600-400 MG-UNIT per tablet Take 1 tablet by mouth 3 (three) times daily with meals.  . diclofenac sodium (VOLTAREN) 1 % GEL Apply 1 application topically 2 (two) times daily as needed. For leg, left hip and foot pain  . fluticasone (FLONASE) 50 MCG/ACT nasal spray Place 2 sprays into both nostrils daily.  . folic acid (FOLVITE) 1 MG tablet Take 1 tablet (1 mg total) by mouth daily.  . furosemide (LASIX) 40 MG tablet Take 1 tablet (40 mg total) by mouth daily.  Marland Kitchen losartan (COZAAR) 100 MG tablet TAKE 1 TABLET BY MOUTH EVERY DAY  . metoprolol (LOPRESSOR) 50 MG tablet Take 1 tablet (50 mg total) by mouth 2 (two) times daily.  . nitroGLYCERIN (NITROSTAT) 0.4 MG SL tablet Place 0.4 mg under the tongue every 5 (five) minutes as needed. For chest pain  . ONE TOUCH ULTRA TEST test strip USE AS DIRECTED AS NEEDED  . ONETOUCH DELICA LANCETS FINE MISC 1 each by Does not apply route daily.  Vladimir Faster Glycol-Propyl Glycol (SYSTANE OP) Place 1 drop into both eyes daily.  . pravastatin (PRAVACHOL) 40 MG tablet  TAKE 1 TABLET (40 MG TOTAL) BY MOUTH DAILY.   Marland Kitchen predniSONE (DELTASONE) 10 MG tablet Take 1 tablet (10 mg total) by mouth daily with breakfast.  . traZODone (DESYREL) 50 MG tablet Take 1 tablet (50 mg total) by mouth at bedtime as needed for sleep.    Allergies  Allergen Reactions  . Metformin And Related     Diarrhea that was felt to be 2/2 metformin so trial of it was attempted.  . Lisinopril     cough    Past Medical History  Diagnosis Date  . Hyperlipidemia   . Hypertension   . Back pain, chronic   . Lupus     Treated with prednisone indefinitely   . Seasonal allergies   . Mitral regurgitation   . Atrial fibrillation     not on coumidin because AIHA and major bleeding complications  . Axillary lymphadenopathy     left negative MMG 1/08  . AIHA (autoimmune hemolytic anemia)     12/08 onset  . Diabetes mellitus   . Complicated grief 12/27/7122    Death of husband in 01/15/2011   . MITRAL REGURGITATION, MODERATE 10/24/2006    2d Echo from 15-Jan-2008 - EF 60%, Moderated MR      family history includes Heart disease in her mother.   ROS: Negative except as per HPI  BP 152/84 mmHg  Pulse 78  Ht 5' 10.5" (1.791 m)  Wt 193 lb 9.6 oz (87.816 kg)  BMI 27.38 kg/m2  PHYSICAL EXAM: Pt is alert and oriented, elderly appearing woman inNAD HEENT: normal Neck: JVP - normal, carotids 2+= without bruits Lungs: CTA bilaterally CV: RRR with grade 3/6 holosystolic murmur best heard at the apex Abd: soft, NT, Positive BS, no hepatomegaly Ext: 11+ calf and ankle edema bilaterally, distal pulses intact and equal Skin: warm/dry no rash  EKG:  Atrial fibrillation 78 bpm, low-voltage QRS.  ASSESSMENT AND PLAN: 1. Permanent atrial fibrillation. This has been long-standing. Warfarin was stopped several years ago because of noncompliance and GI bleeding. She continues on aspirin. She is not a candidate for novel anticoagulant drugs because of her valvular heart disease.  2. Severe mitral regurgitation. The patient has developed significant  pulmonary hypertension and she has severe eccentric mitral regurgitation seen on her echocardiogram. We spent greater than 30 minutes in discussion today about her expected clinical course and treatment options. Because of her limited mobility and severe deconditioning, she is very reluctant to consider surgical mitral valve repair even via a minimally invasive approach. She understands that her clinical symptoms will continue to worsen without intervention. She may be a candidate for a minimally invasive therapy such as a Mitra-Clip but isn't sure if she would want to pursue this. She understands that a minimum her evaluation would need to include a right and left heart catheterization as well as a TEE. She would like to think things over. I will see her back in 3 months for further discussion.  3. Essential hypertension, uncontrolled. She is on a combination of furosemide, losartan, and metoprolol. I am going to increase her metoprolol to 75 mg twice daily.  [name], MD 10/10/2014 12:53 PM

## 2014-10-10 NOTE — Patient Instructions (Signed)
  Your physician recommends that you schedule a follow-up appointment in: 3 months with Dr. Cooper.  Your physician recommends that you continue on your current medications as directed. Please refer to the Current Medication list given to you today.  

## 2014-10-10 NOTE — Progress Notes (Signed)
Internal Medicine Clinic Attending  Case discussed with Dr. Wilson soon after the resident saw the patient.  We reviewed the resident's history and exam and pertinent patient test results.  I agree with the assessment, diagnosis, and plan of care documented in the resident's note.  

## 2014-10-15 ENCOUNTER — Telehealth: Payer: Self-pay | Admitting: Cardiovascular Disease

## 2014-10-15 NOTE — Telephone Encounter (Signed)
New msg      What can pt take for cold?   Is pt ok to travel by car to [place] for son surgery?   Please return call to [name] pt daughter [phone].

## 2014-10-15 NOTE — Telephone Encounter (Signed)
Returned pt daughter call. Ronaldo Miyamoto that pt could take plain mucinex of coricidin otc for a cold. Pt will be traveling to [place] in a car  To see her son in [place] and want to know if Dr.Dmitgh thinks its ok. Adv her I will fwd him a message and call back with his recommendation

## 2014-10-27 NOTE — Telephone Encounter (Signed)
These recommendations are fine. She can travel as long as symptoms unchanged. thx  [name] 10/27/2014 1:06 PM

## 2014-10-29 ENCOUNTER — Encounter: Payer: Self-pay | Admitting: Cardiovascular Disease

## 2014-10-29 NOTE — Telephone Encounter (Signed)
Left message on machine for Stephanie Horne (pt's daughter) to contact the office.

## 2014-10-29 NOTE — Telephone Encounter (Signed)
This encounter was created in error - please disregard.

## 2014-10-29 NOTE — Telephone Encounter (Signed)
New message     Returning a nurses call regarding if pt can travel

## 2014-10-29 NOTE — Telephone Encounter (Signed)
I spoke with [name] and made her aware of Dr Antionette Char comments in regards to the pt traveling.

## 2014-12-04 ENCOUNTER — Other Ambulatory Visit: Payer: Self-pay | Admitting: Internal Medicine

## 2015-01-07 ENCOUNTER — Encounter: Payer: Medicare Other | Admitting: Internal Medicine

## 2015-01-15 ENCOUNTER — Ambulatory Visit (INDEPENDENT_AMBULATORY_CARE_PROVIDER_SITE_OTHER): Payer: Medicare Other | Admitting: Internal Medicine

## 2015-01-15 ENCOUNTER — Encounter: Payer: Self-pay | Admitting: Internal Medicine

## 2015-01-15 VITALS — BP 125/65 | HR 68 | Temp 98.1°F | Ht 70.5 in | Wt 192.2 lb

## 2015-01-15 DIAGNOSIS — I10 Essential (primary) hypertension: Secondary | ICD-10-CM | POA: Diagnosis not present

## 2015-01-15 DIAGNOSIS — E119 Type 2 diabetes mellitus without complications: Secondary | ICD-10-CM

## 2015-01-15 DIAGNOSIS — R269 Unspecified abnormalities of gait and mobility: Secondary | ICD-10-CM | POA: Diagnosis not present

## 2015-01-15 DIAGNOSIS — I340 Nonrheumatic mitral (valve) insufficiency: Secondary | ICD-10-CM | POA: Diagnosis not present

## 2015-01-15 DIAGNOSIS — Z9181 History of falling: Secondary | ICD-10-CM

## 2015-01-15 LAB — BASIC METABOLIC PANEL WITHOUT GFR
BUN: 15 mg/dL (ref 6–23)
CO2: 30 meq/L (ref 19–32)
Calcium: 9.6 mg/dL (ref 8.4–10.5)
Chloride: 106 meq/L (ref 96–112)
Creat: 0.69 mg/dL (ref 0.50–1.10)
GFR, Est African American: >89 mL/min
GFR, Est Non African American: 85 mL/min
Glucose, Bld: 128 mg/dL — ABNORMAL HIGH (ref 70–99)
Potassium: 3.5 meq/L (ref 3.5–5.3)
Sodium: 143 meq/L (ref 135–145)

## 2015-01-15 LAB — POCT GLYCOSYLATED HEMOGLOBIN (HGB A1C): Hemoglobin A1C: 6.9

## 2015-01-15 LAB — GLUCOSE, CAPILLARY: Glucose-Capillary: 156 mg/dL — ABNORMAL HIGH (ref 70–99)

## 2015-01-15 NOTE — Assessment & Plan Note (Addendum)
Lab Results  Component Value Date   HGBA1C 6.9 01/15/2015   HGBA1C 7.3 10/08/2014   HGBA1C 7.2 06/04/2014     Assessment: Diabetes control:  well controlled Progress toward A1C goal:   at goal  Plan: Medications:  None; diet controlled Home glucose monitoring:  None required but she likes to check it daily. Frequency:   Timing:   Instruction/counseling given: discussed diet Educational resources provided: brochure (denies) Other plans: Continue with diet control.

## 2015-01-15 NOTE — Progress Notes (Signed)
Case discussed with Dr. Wilson soon after the resident saw the patient. We reviewed the resident's history and exam and pertinent patient test results. I agree with the assessment, diagnosis, and plan of care documented in the resident's note. 

## 2015-01-15 NOTE — Progress Notes (Signed)
   Subjective:    Patient ID: Stephanie Horne, female    DOB: 09-25-1941, 74 y.o.   MRN: [id number]  HPI Comments: Stephanie Horne is a 74 year old woman with PMH as below here for follow-up of chronic conditions.  Please see problem based charting for A&P.    Past Medical History  Diagnosis Date  . Hyperlipidemia   . Hypertension   . Back pain, chronic   . Lupus     Treated with prednisone indefinitely   . Seasonal allergies   . Mitral regurgitation   . Atrial fibrillation     not on coumidin because AIHA and major bleeding complications  . Axillary lymphadenopathy     left negative MMG 1/08  . AIHA (autoimmune hemolytic anemia)     12/08 onset  . Diabetes mellitus   . Complicated grief 10/30/7865    Death of husband in January 11, 2011   . MITRAL REGURGITATION, MODERATE 10/24/2006    2d Echo from 01-11-2008 - EF 60%, Moderated MR       Review of Systems  Constitutional: Negative for fever, chills and appetite change.  HENT: Negative for hearing loss.   Eyes: Negative for visual disturbance.  Respiratory: Negative for shortness of breath.   Cardiovascular: Negative for chest pain, palpitations and leg swelling.  Gastrointestinal: Positive for diarrhea. Negative for nausea, vomiting, abdominal pain, constipation and blood in stool.       Diarrhea two weeks ago after her birthday party;  that has resolved.  Endocrine: Negative for polydipsia and polyuria.  Genitourinary: Negative for dysuria, hematuria and dyspareunia.  Neurological: Negative for dizziness, syncope and light-headedness.       Filed Vitals:   01/15/15 1129  BP: 125/65  Pulse: 68  Temp: 98.1 F (36.7 C)  TempSrc: Oral  Height: 5' 10.5" (1.791 m)  Weight: 192 lb 3.2 oz (87.181 kg)  SpO2: 100%     Objective:   Physical Exam  Constitutional: She is oriented to person, place, and time. She appears well-developed. No distress.  HENT:  Head: Normocephalic and atraumatic.  Mouth/Throat: Oropharynx is clear and moist. No  oropharyngeal exudate.  Eyes: EOM are normal. Pupils are equal, round, and reactive to light.  Neck: Neck supple.  Cardiovascular: Normal rate and normal heart sounds.  Exam reveals no gallop and no friction rub.   No murmur heard. Irregularly irregular  Pulmonary/Chest: Effort normal and breath sounds normal. No respiratory distress. She has no wheezes. She has no rales.  Abdominal: Soft. Bowel sounds are normal. She exhibits no distension and no mass. There is no tenderness. There is no rebound and no guarding.  Musculoskeletal: Normal range of motion. She exhibits no edema or tenderness.  She has valgus knee deformity B/L.    Neurological: She is alert and oriented to person, place, and time. No cranial nerve deficit.  She is able to ambulate with the assistance of cane.    Skin: Skin is warm. She is not diaphoretic.  There are no bruises on hips/thighs.  Psychiatric: She has a normal mood and affect. Her behavior is normal.  Vitals reviewed.         Assessment & Plan:  Please see problem based charting for A&P.

## 2015-01-15 NOTE — Assessment & Plan Note (Addendum)
BP Readings from Last 3 Encounters:  01/15/15 125/65  10/10/14 152/84  10/08/14 148/88    Lab Results  Component Value Date   NA 145 06/18/2014   K 3.5 06/18/2014   CREATININE 0.62 06/18/2014    Assessment: Blood pressure control:  well controlled Progress toward BP goal:   at goal Comments:  She reports compliance with medications.   Plan: Medications:  continue current medications:  Losartan 100mg  daily, metoprolol 50mg  BID, Lasix 40mg  daily Educational resources provided: brochure (denies) Other plans:  BMP today.  RTC in 3 months.

## 2015-01-18 NOTE — Assessment & Plan Note (Addendum)
Cardiology note from 10/10/14 reviewed.  The patient was offered surgical repair but she is resistant to even minimally invasive surgery.  She tells me she still is not interested in surgery.  She understands that her symptoms will worsen without intervention.  She is willing to keep follow-up with cardiology. - follow-up with Cardiology this month

## 2015-01-18 NOTE — Assessment & Plan Note (Addendum)
She has hx of falls and has been evaluated by PT in the past.  She says she fell last week while washing dishes at the kitchen sink.  She was using her Rollator and went to push back from the sink and slid down landing on her right hip/thigh.  She described a mechanical fall; she denies chest pain, palpitations, lightheadedness prior to the event.  She denies pain but says her legs feel sore.  She does not have any obvious bruising.  She was able to stand and walk across the room with her cane.  She has hx of osteoporosis and chronic steroid use but I doubt hip fracture since she is pain free and able to stand and walk.  I advised xray for confirmation but she was hesitant.  I have asked her to return to clinic if she develops pain and she agrees.

## 2015-01-25 ENCOUNTER — Other Ambulatory Visit: Payer: Self-pay | Admitting: Internal Medicine

## 2015-01-31 ENCOUNTER — Other Ambulatory Visit: Payer: Self-pay | Admitting: Internal Medicine

## 2015-02-02 ENCOUNTER — Other Ambulatory Visit: Payer: Self-pay | Admitting: *Deleted

## 2015-02-02 DIAGNOSIS — M329 Systemic lupus erythematosus, unspecified: Secondary | ICD-10-CM

## 2015-04-02 ENCOUNTER — Other Ambulatory Visit: Payer: Self-pay | Admitting: Internal Medicine

## 2015-05-06 ENCOUNTER — Encounter: Payer: Self-pay | Admitting: Internal Medicine

## 2015-05-06 ENCOUNTER — Ambulatory Visit (INDEPENDENT_AMBULATORY_CARE_PROVIDER_SITE_OTHER): Payer: Medicare Other | Admitting: Internal Medicine

## 2015-05-06 VITALS — BP 118/64 | HR 59 | Temp 98.1°F | Ht 70.5 in | Wt 186.8 lb

## 2015-05-06 DIAGNOSIS — Z7982 Long term (current) use of aspirin: Secondary | ICD-10-CM

## 2015-05-06 DIAGNOSIS — I4819 Other persistent atrial fibrillation: Secondary | ICD-10-CM

## 2015-05-06 DIAGNOSIS — I4891 Unspecified atrial fibrillation: Secondary | ICD-10-CM

## 2015-05-06 DIAGNOSIS — J309 Allergic rhinitis, unspecified: Secondary | ICD-10-CM

## 2015-05-06 DIAGNOSIS — E119 Type 2 diabetes mellitus without complications: Secondary | ICD-10-CM

## 2015-05-06 DIAGNOSIS — E118 Type 2 diabetes mellitus with unspecified complications: Secondary | ICD-10-CM

## 2015-05-06 LAB — POCT GLYCOSYLATED HEMOGLOBIN (HGB A1C): Hemoglobin A1C: 7

## 2015-05-06 LAB — GLUCOSE, CAPILLARY: Glucose-Capillary: 118 mg/dL — ABNORMAL HIGH (ref 65–99)

## 2015-05-06 MED ORDER — LEVOCETIRIZINE DIHYDROCHLORIDE 5 MG PO TABS: 2.5000 mg | ORAL_TABLET | Freq: Every evening | ORAL | Status: DC

## 2015-05-06 NOTE — Progress Notes (Signed)
Subjective:    Patient ID: Stephanie Horne, female    DOB: 1941/01/02, 74 y.o.   MRN: [id number]  HPI Comments: Stephanie Horne is a 74 year old woman with PMH as below here for routine follow-up.  She reports that she recently lost her son this past weekend.  Please see problem based charting for assessment and plan.     Past Medical History  Diagnosis Date  . Hyperlipidemia   . Hypertension   . Back pain, chronic   . Lupus     Treated with prednisone indefinitely   . Seasonal allergies   . Mitral regurgitation   . Atrial fibrillation     not on coumidin because AIHA and major bleeding complications  . Axillary lymphadenopathy     left negative MMG 1/08  . AIHA (autoimmune hemolytic anemia)     12/08 onset  . Diabetes mellitus   . Complicated grief 5/44/9201    Death of husband in 01/30/2011   . MITRAL REGURGITATION, MODERATE 10/24/2006    2d Echo from 30-Jan-2008 - EF 60%, Moderated MR     Current Outpatient Prescriptions on File Prior to Visit  Medication Sig Dispense Refill  . aspirin 81 MG EC tablet Take 81 mg by mouth daily.      . Calcium Carbonate-Vitamin D (CALTRATE 600+D) 600-400 MG-UNIT per tablet Take 1 tablet by mouth 3 (three) times daily with meals. 90 tablet 5  . diclofenac sodium (VOLTAREN) 1 % GEL Apply 1 application topically 2 (two) times daily as needed. For leg, left hip and foot pain 1 Tube 5  . fluticasone (FLONASE) 50 MCG/ACT nasal spray Place 2 sprays into both nostrils daily. 16 g 3  . furosemide (LASIX) 40 MG tablet TAKE 1 TABLET (40 MG TOTAL) BY MOUTH DAILY. 30 tablet 3  . losartan (COZAAR) 100 MG tablet TAKE 1 TABLET BY MOUTH EVERY DAY 30 tablet 3  . metoprolol (LOPRESSOR) 50 MG tablet TAKE 1 TABLET (50 MG TOTAL) BY MOUTH 2 (TWO) TIMES DAILY. 60 tablet 3  . nitroGLYCERIN (NITROSTAT) 0.4 MG SL tablet Place 0.4 mg under the tongue every 5 (five) minutes as needed. For chest pain    . ONE TOUCH ULTRA TEST test strip USE AS DIRECTED AS NEEDED 100 each 3  . ONETOUCH  DELICA LANCETS FINE MISC 1 each by Does not apply route daily. 100 each 3  . Polyethyl Glycol-Propyl Glycol (SYSTANE OP) Place 1 drop into both eyes daily.    . pravastatin (PRAVACHOL) 40 MG tablet TAKE 1 TABLET (40 MG TOTAL) BY MOUTH DAILY. 30 tablet 3  . predniSONE (DELTASONE) 10 MG tablet Take 1 tablet (10 mg total) by mouth daily with breakfast. 30 tablet 5  . traZODone (DESYREL) 50 MG tablet TAKE 1 TABLET (50 MG TOTAL) BY MOUTH AT BEDTIME AS NEEDED FOR SLEEP. 30 tablet 3  . [DISCONTINUED] Calcium Carbonate-Vitamin D (CALTRATE 600+D PO) Take 600 mg by mouth daily.     No current facility-administered medications on file prior to visit.    Review of Systems  Constitutional: Negative for appetite change.  HENT: Negative for postnasal drip and sneezing.   Respiratory: Negative for shortness of breath.        Filed Vitals:   05/06/15 1526  BP: 118/64  Pulse: 59  Temp: 98.1 F (36.7 C)  TempSrc: Oral  Height: 5' 10.5" (1.791 m)  Weight: 186 lb 12.8 oz (84.732 kg)  SpO2: 100%    Objective:   Physical Exam  Constitutional: She is oriented to person, place, and time. She appears well-developed. No distress.  HENT:  Head: Normocephalic and atraumatic.  Mouth/Throat: No oropharyngeal exudate.  Eyes: EOM are normal. Pupils are equal, round, and reactive to light.  Neck: Normal range of motion. Neck supple.  Cardiovascular: Normal rate and normal heart sounds.  Exam reveals no gallop and no friction rub.   No murmur heard. Irregular rhythm  Pulmonary/Chest: Effort normal and breath sounds normal. No respiratory distress. She has no wheezes. She has no rales.  Abdominal: Soft. Bowel sounds are normal. She exhibits no distension. There is no tenderness. There is no rebound and no guarding.  Musculoskeletal: Normal range of motion. She exhibits no edema or tenderness.  Neurological: She is alert and oriented to person, place, and time. No cranial nerve deficit.  Skin: Skin is warm.  She is not diaphoretic.  Psychiatric: She has a normal mood and affect. Her behavior is normal. Judgment and thought content normal.  Vitals reviewed.         Assessment & Plan:  Please see problem based charting for assessment and plan.

## 2015-05-06 NOTE — Patient Instructions (Addendum)
1. I am very sorry for your loss.  I know you will be very busy in the next few weeks.  Please return to see me in the next 1-2 months or sooner if you need to.   2. Please take all medications as prescribed.  I have prescribed levocetirizine for your allergies.  Try half of a pill (2.5mg ) every evening.  If you are still having symptoms you can increase to 1 whole pill (5mg ) daily.  If levocetirizine is too expensive with your insurance co-pay you can try buyint over-the-counter cetirizine or loratadine instead.   3. If you have worsening of your symptoms or new symptoms arise, please call the clinic ([phone]), or go to the ER immediately if symptoms are severe.

## 2015-05-07 DIAGNOSIS — J309 Allergic rhinitis, unspecified: Secondary | ICD-10-CM | POA: Insufficient documentation

## 2015-05-07 NOTE — Assessment & Plan Note (Signed)
Most of our visit focused on discussing the recent loss of her son.  I told her we did not have to meet today if she did not want to because she has to go make funeral arrangements.  I asked if there were any medical issues she absolutely wanted to discuss.  Her only concern was that Flonase was not helping her allergy symptoms.  She reports itchy nose, cough, sneezing, post-nasal drip c/w rhinitis.  No dyspnea.  She can't attribute it to any certain trigger. - ADD levocetirizine (formulary preferred) - can continue Flonase

## 2015-05-07 NOTE — Assessment & Plan Note (Addendum)
Will discuss patient's hx of GI bleed on coumadin when I see her again.  If not significant or if the inciting event was addressed, will discuss benefit of restarting A/C with patient for CVA prevention.  Continue ASA and BB for now.

## 2015-05-08 NOTE — Progress Notes (Signed)
Internal Medicine Clinic Attending  Case discussed with Dr. Wilson soon after the resident saw the patient.  We reviewed the resident's history and exam and pertinent patient test results.  I agree with the assessment, diagnosis, and plan of care documented in the resident's note.  

## 2015-06-01 ENCOUNTER — Other Ambulatory Visit: Payer: Self-pay | Admitting: Internal Medicine

## 2015-08-01 ENCOUNTER — Other Ambulatory Visit: Payer: Self-pay | Admitting: Internal Medicine

## 2015-09-16 ENCOUNTER — Other Ambulatory Visit: Payer: Self-pay | Admitting: Internal Medicine

## 2015-09-23 ENCOUNTER — Encounter: Payer: Self-pay | Admitting: Internal Medicine

## 2015-09-30 ENCOUNTER — Other Ambulatory Visit: Payer: Self-pay | Admitting: Internal Medicine

## 2015-09-30 ENCOUNTER — Telehealth: Payer: Self-pay | Admitting: *Deleted

## 2015-09-30 ENCOUNTER — Encounter: Payer: Self-pay | Admitting: Internal Medicine

## 2015-09-30 ENCOUNTER — Ambulatory Visit (INDEPENDENT_AMBULATORY_CARE_PROVIDER_SITE_OTHER): Payer: Medicare Other | Admitting: Internal Medicine

## 2015-09-30 VITALS — BP 144/86 | HR 80 | Temp 97.9°F | Ht 70.5 in | Wt 188.8 lb

## 2015-09-30 DIAGNOSIS — I340 Nonrheumatic mitral (valve) insufficiency: Secondary | ICD-10-CM

## 2015-09-30 DIAGNOSIS — I10 Essential (primary) hypertension: Secondary | ICD-10-CM | POA: Diagnosis not present

## 2015-09-30 DIAGNOSIS — M329 Systemic lupus erythematosus, unspecified: Secondary | ICD-10-CM | POA: Diagnosis not present

## 2015-09-30 DIAGNOSIS — J309 Allergic rhinitis, unspecified: Secondary | ICD-10-CM

## 2015-09-30 DIAGNOSIS — M810 Age-related osteoporosis without current pathological fracture: Secondary | ICD-10-CM

## 2015-09-30 DIAGNOSIS — I481 Persistent atrial fibrillation: Secondary | ICD-10-CM

## 2015-09-30 DIAGNOSIS — E118 Type 2 diabetes mellitus with unspecified complications: Secondary | ICD-10-CM | POA: Diagnosis not present

## 2015-09-30 DIAGNOSIS — I4819 Other persistent atrial fibrillation: Secondary | ICD-10-CM

## 2015-09-30 LAB — GLUCOSE, CAPILLARY: Glucose-Capillary: 180 mg/dL — ABNORMAL HIGH (ref 65–99)

## 2015-09-30 LAB — POCT GLYCOSYLATED HEMOGLOBIN (HGB A1C): Hemoglobin A1C: 7

## 2015-09-30 MED ORDER — LEVOCETIRIZINE DIHYDROCHLORIDE 5 MG PO TABS: 2.5000 mg | ORAL_TABLET | Freq: Every evening | ORAL | Status: DC

## 2015-09-30 MED ORDER — FLUTICASONE PROPIONATE 50 MCG/ACT NA SUSP: 2.0000 | Freq: Every day | NASAL | Status: DC

## 2015-09-30 NOTE — Assessment & Plan Note (Addendum)
Assessment:  Compliant with Vit D/Calcium.  Currently on bisphosphonate drug holiday.  Last DEXA two years ago. Plan:  Continue vitamin D/calcium for now.  Refer for DEXA.

## 2015-09-30 NOTE — Assessment & Plan Note (Addendum)
BP Readings from Last 3 Encounters:  09/30/15 144/86  05/06/15 118/64  01/15/15 125/65    Lab Results  Component Value Date   NA 143 01/15/2015   K 3.5 01/15/2015   CREATININE 0.69 01/15/2015    Assessment: Blood pressure control:  slightly elevated Progress toward BP goal:   near goal Comments: She did not take BP medications this AM because she was running late to appointment.  She reports compliance with medications and has brought her bottles with her.  Plan: Medications:  continue current medications:  Losartan 100mg  daily, metoprolol 50mg  BID, Lasix 40mg  daily Educational resources provided: brochure (denies) Self management tools provided:   Other plans: RTC 6 months for follow-up.

## 2015-09-30 NOTE — Progress Notes (Signed)
Subjective:    Patient ID: Stephanie Horne, female    DOB: [birth year], 74 y.o.   MRN: [id number]  HPI Comments: [name] is a 74 year old woman with PMH as below here for follow-up of HTN and DM.  She also has acute c/o cough x 2 weeks.  Please see problem based charting for status of her chronic conditions.   Cough This is a new (two weeks) problem. The current episode started 1 to 4 weeks ago. The problem has been unchanged. Cough characteristics: sometimes dry, sometimes sputum. Associated symptoms include ear congestion, ear pain, rhinorrhea and weight loss. Pertinent negatives include no chest pain, chills, fever, headaches, hemoptysis, nasal congestion, postnasal drip, sore throat, shortness of breath, sweats or wheezing. Associated symptoms comments: Reports 8-9 pound weight loss in past month.  "trying to eat healthy."  Has made dietary changes.. Exacerbated by: being outdoors. She has tried steroid inhaler for the symptoms. The treatment provided no relief. There is no history of asthma or COPD.     Past Medical History  Diagnosis Date  . Hyperlipidemia   . Hypertension   . Back pain, chronic   . Lupus ([place])     Treated with prednisone indefinitely   . Seasonal allergies   . Mitral regurgitation   . Atrial fibrillation ([place])     not on coumidin because AIHA and major bleeding complications  . Axillary lymphadenopathy     left negative MMG 1/08  . AIHA (autoimmune hemolytic anemia) ([place])     12/08 onset  . Diabetes mellitus   . Complicated grief [id number]    Death of husband in 2011/01/26   . MITRAL REGURGITATION, MODERATE 10/24/2006    2d Echo from 01-26-2008 - EF 60%, Moderated MR     Current Outpatient Prescriptions on File Prior to Visit  Medication Sig Dispense Refill  . aspirin 81 MG EC tablet Take 81 mg by mouth daily.      . Calcium Carbonate-Vitamin D (CALTRATE 600+D) 600-400 MG-UNIT per tablet Take 1 tablet by mouth 3 (three) times daily with meals. 90 tablet 5  .  diclofenac sodium (VOLTAREN) 1 % GEL Apply 1 application topically 2 (two) times daily as needed. For leg, left hip and foot pain 1 Tube 5  . fluticasone (FLONASE) 50 MCG/ACT nasal spray Place 2 sprays into both nostrils daily. 16 g 3  . furosemide (LASIX) 40 MG tablet TAKE 1 TABLET (40 MG TOTAL) BY MOUTH DAILY. 30 tablet 3  . levocetirizine (XYZAL) 5 MG tablet Take 0.5 tablets (2.5 mg total) by mouth every evening. 30 tablet 1  . losartan (COZAAR) 100 MG tablet TAKE 1 TABLET BY MOUTH EVERY DAY 90 tablet 1  . metoprolol (LOPRESSOR) 50 MG tablet TAKE 1 TABLET (50 MG TOTAL) BY MOUTH 2 (TWO) TIMES DAILY. 60 tablet 3  . nitroGLYCERIN (NITROSTAT) 0.4 MG SL tablet Place 0.4 mg under the tongue every 5 (five) minutes as needed. For chest pain    . ONE TOUCH ULTRA TEST test strip USE AS DIRECTED AS NEEDED 100 each 3  . ONETOUCH DELICA LANCETS FINE MISC 1 each by Does not apply route daily. 100 each 3  . Polyethyl Glycol-Propyl Glycol (SYSTANE OP) Place 1 drop into both eyes daily.    . pravastatin (PRAVACHOL) 40 MG tablet TAKE 1 TABLET (40 MG TOTAL) BY MOUTH DAILY. 30 tablet 3  . predniSONE (DELTASONE) 10 MG tablet Take 1 tablet (10 mg total) by mouth daily with breakfast. 30 tablet  5  . traZODone (DESYREL) 50 MG tablet TAKE 1 TABLET (50 MG TOTAL) BY MOUTH AT BEDTIME AS NEEDED FOR SLEEP. 30 tablet 3  . [DISCONTINUED] Calcium Carbonate-Vitamin D (CALTRATE 600+D PO) Take 600 mg by mouth daily.     No current facility-administered medications on file prior to visit.    Review of Systems  Constitutional: Positive for weight loss. Negative for fever and chills.  HENT: Positive for ear pain and rhinorrhea. Negative for congestion, postnasal drip, sinus pressure, sneezing and sore throat.   Eyes: Negative for visual disturbance.  Respiratory: Positive for cough. Negative for hemoptysis, shortness of breath and wheezing.   Cardiovascular: Negative for chest pain, palpitations and leg swelling.    Gastrointestinal: Negative for nausea, vomiting, abdominal pain, diarrhea, constipation and blood in stool.       No reflux  Genitourinary: Negative for dysuria, hematuria, vaginal bleeding and difficulty urinating.  Neurological: Negative for syncope, weakness and headaches.  Psychiatric/Behavioral: Negative for dysphoric mood.       Filed Vitals:   09/30/15 0854  BP: 144/86  Pulse: 80  Height: 5' 10.5" (1.791 m)  Weight: 188 lb 12.8 oz (85.639 kg)  SpO2: 99%     Objective:   Physical Exam  Constitutional: She is oriented to person, place, and time. She appears well-developed. No distress.  HENT:  Head: Normocephalic and atraumatic.  Mouth/Throat: Oropharynx is clear and moist. No oropharyngeal exudate.  Eyes: Conjunctivae and EOM are normal. Pupils are equal, round, and reactive to light.  Neck: Neck supple.  Cardiovascular: Regular rhythm, normal heart sounds and intact distal pulses.  Exam reveals no gallop and no friction rub.   No murmur heard. irregular  Pulmonary/Chest: Effort normal and breath sounds normal. No respiratory distress. She has no wheezes. She has no rales.  Abdominal: Soft. Bowel sounds are normal. She exhibits no distension and no mass. There is no tenderness. There is no rebound and no guarding.  Musculoskeletal: Normal range of motion. She exhibits no edema or tenderness.  Neurological: She is alert and oriented to person, place, and time. No cranial nerve deficit.  Skin: Skin is warm. She is not diaphoretic.  Psychiatric: She has a normal mood and affect. Her behavior is normal. Judgment and thought content normal.  Vitals reviewed.         Assessment & Plan:  Please see problem based charting for A&P.

## 2015-09-30 NOTE — Progress Notes (Signed)
Internal Medicine Clinic Attending  Case discussed with Dr. Wilson soon after the resident saw the patient.  We reviewed the resident's history and exam and pertinent patient test results.  I agree with the assessment, diagnosis, and plan of care documented in the resident's note.  

## 2015-09-30 NOTE — Assessment & Plan Note (Signed)
Assessment:  She says the DOE she was experiencing has resolved.  She is still not interested in MV surgery.  She agrees to follow-up with cardiology since she has not been in nearly 1 year. Plan: refer back to cardiology

## 2015-09-30 NOTE — Assessment & Plan Note (Signed)
Lab Results  Component Value Date   HGBA1C 7.0 05/06/2015   HGBA1C 6.9 01/15/2015   HGBA1C 7.3 10/08/2014     Assessment: Diabetes control:  well controlled Progress toward A1C goal:   at goal Comments: Forgot meter today.  Reports fasting CBGs 90-110s.  Never more than 180 after meals.   Plan: Medications:  Diet controlled, no medications Home glucose monitoring: Frequency:   Timing:   Instruction/counseling given:  Educational resources provided: brochure (denies) Self management tools provided:   Other plans: RTC in 6 months for follow-up.

## 2015-09-30 NOTE — Assessment & Plan Note (Addendum)
Assessment:  Cough and rhinorrhea x 2 weeks, no sore throat, congestion, fever, dyspnea.  She has allergies and says symptoms worsened when she goes outside (temperature does not matter).  She denies GERD symptoms.  She has been using Flonase 1 spray per nostril daily.  She never picked up levocetirizine because she says the pharmacy did not have it and was to call our office.  She had ACEI cough but has done well on ARB for some time now.  ARB less likely to cause cough.  No dyspnea, wheeze, PND or orthopnea and lung CTA.   Plan:   - increase Flonase to 2 spray per nostril for now - re-prescribe levocetirizine (formulary preferred) - avoid triggers - RTC if cough persists beyond the next two weeks or new symptoms develop; may need to consider cough variant asthma vs GERD and treat accordingly

## 2015-09-30 NOTE — Patient Instructions (Signed)
1. You are doing a great job keeping your diabetes under control.  Please continue to focus on healthy diet. I will place referrals so you can see Rheumatology regarding your Lupus and so you can see Cardiology regarding your mitral regurgitation and atrial fibrillation.   Please come back to see me if your cough continue after another two weeks.  We may need to try changing one of your medications if it does.    2. Please take all medications as prescribed.    3. If you have worsening of your symptoms or new symptoms arise, please call the clinic [phone] or go to the ER immediately if symptoms are severe.  Please come back to see me in 6 months or sooner if your cough persists.

## 2015-09-30 NOTE — Assessment & Plan Note (Addendum)
Assessment:   On pred 10mg  daily.  Has chronic joint pain.  Requesting referral back to rheum because she has not been evaluated in years. I referred 1 year ago but visit had not been scheduled. Plan:  Continue pred 10mg  daily.  Referral to rheum.

## 2015-09-30 NOTE — Telephone Encounter (Signed)
Call to patient given Cardiology appointment with B. Simmons PA on 10/15/2015 at 10:30 AM. Patient voiced understanding of the appointment and plan.  Sander Nephew, RN 09/30/2015 2:16 PM

## 2015-09-30 NOTE — Assessment & Plan Note (Addendum)
Assessment:  Valvular Afib with GIB on coumadin 6 years ago.  No A/C since.  No stroke symptoms.  HR controlled on BB. She says she does not want to go back on coumadin.  She would be willing to try NOAC with less bleeding risk.  Given her stroke risk, I would like to try Eliquis, although not studied in this patient population, theoretically should provide benefit.   Plan: - I will send message to her cardiologist, Dr. Burt Knack to weigh-in on trying Eliquis. - Continue metoprolol 50mg  BID

## 2015-10-01 ENCOUNTER — Other Ambulatory Visit: Payer: Self-pay | Admitting: Internal Medicine

## 2015-10-06 ENCOUNTER — Telehealth: Payer: Self-pay | Admitting: *Deleted

## 2015-10-06 NOTE — Telephone Encounter (Signed)
CALLED LEFT VOICE MESSAGE FOR DAUGHTER TO RETURN CALL REGARDING RHEUMATOLOGY. REFERRAL DONE IN PAST , GOT APPOINTMENT, DAUGHTER CANCELED APPOINTMENT. SHE WAS TO CALL THE OFFICE IN WHICH SHE WANTED [facility] TO GO TO.  WAITING FOR HER TO GIVE CALL BACK.

## 2015-10-15 ENCOUNTER — Ambulatory Visit: Payer: Medicare Other | Admitting: Cardiology

## 2015-10-28 ENCOUNTER — Telehealth: Payer: Self-pay | Admitting: *Deleted

## 2015-10-28 NOTE — Telephone Encounter (Signed)
SPOKE WITH PATIENT SHE IS IN [place], SICKNESS IN FAMILY. PATIENT WAS GIVEN NEW APPOINTMENT INFORMATION. APPOINTMENT ALSO MAILED TO THE PATIENT.

## 2015-11-06 ENCOUNTER — Other Ambulatory Visit: Payer: Self-pay | Admitting: Internal Medicine

## 2015-12-02 ENCOUNTER — Other Ambulatory Visit: Payer: Self-pay | Admitting: Internal Medicine

## 2015-12-04 ENCOUNTER — Other Ambulatory Visit: Payer: Self-pay | Admitting: Cardiovascular Disease

## 2015-12-04 ENCOUNTER — Encounter: Payer: Self-pay | Admitting: Cardiovascular Disease

## 2015-12-04 ENCOUNTER — Ambulatory Visit (INDEPENDENT_AMBULATORY_CARE_PROVIDER_SITE_OTHER): Payer: Medicare Other | Admitting: Cardiovascular Disease

## 2015-12-04 VITALS — BP 150/84 | HR 92 | Ht 70.5 in | Wt 184.0 lb

## 2015-12-04 DIAGNOSIS — I340 Nonrheumatic mitral (valve) insufficiency: Secondary | ICD-10-CM | POA: Diagnosis not present

## 2015-12-04 DIAGNOSIS — I482 Chronic atrial fibrillation: Secondary | ICD-10-CM | POA: Diagnosis not present

## 2015-12-04 DIAGNOSIS — R0602 Shortness of breath: Secondary | ICD-10-CM

## 2015-12-04 DIAGNOSIS — I4821 Permanent atrial fibrillation: Secondary | ICD-10-CM

## 2015-12-04 LAB — CBC
HEMATOCRIT: 41 % (ref 36.0–46.0)
HEMOGLOBIN: 13.8 g/dL (ref 12.0–15.0)
MCH: 31.8 pg (ref 26.0–34.0)
MCHC: 33.7 g/dL (ref 30.0–36.0)
MCV: 94.5 fL (ref 78.0–100.0)
MPV: 10.8 fL (ref 8.6–12.4)
Platelets: 153 10*3/uL (ref 150–400)
RBC: 4.34 MIL/uL (ref 3.87–5.11)
RDW: 13.3 % (ref 11.5–15.5)
WBC: 4.5 10*3/uL (ref 4.0–10.5)

## 2015-12-04 LAB — BRAIN NATRIURETIC PEPTIDE: BRAIN NATRIURETIC PEPTIDE: 235.1 pg/mL — AB (ref <100)

## 2015-12-04 LAB — BASIC METABOLIC PANEL
BUN: 15 mg/dL (ref 7–25)
CALCIUM: 9.1 mg/dL (ref 8.6–10.4)
CO2: 30 mmol/L (ref 20–31)
Chloride: 108 mmol/L (ref 98–110)
Creat: 0.89 mg/dL (ref 0.60–0.93)
GLUCOSE: 116 mg/dL — AB (ref 65–99)
POTASSIUM: 4.1 mmol/L (ref 3.5–5.3)
SODIUM: 144 mmol/L (ref 135–146)

## 2015-12-04 NOTE — Patient Instructions (Addendum)
Medication Instructions:  Your physician recommends that you continue on your current medications as directed. Please refer to the Current Medication list given to you today.  If lab results are okay then we will advise to STOP Aspirin and START Eliquis 5mg  take one tablet by mouth twice a day  Labwork: Your physician recommends that you have lab work today: BMP, BNP and CBC  Testing/Procedures: Your physician has requested that you have a TEE. During a TEE, sound waves are used to create images of your heart. It provides your doctor with information about the size and shape of your heart and how well your heart's chambers and valves are working. In this test, a transducer is attached to the end of a flexible tube that's guided down your throat and into your esophagus (the tube leading from you mouth to your stomach) to get a more detailed image of your heart. You are not awake for the procedure. Please see the instruction sheet given to you today. For further information please visit [email or web address].  Follow-Up: Your physician recommends that you schedule a follow-up appointment in: 3 MONTHS with Dr Burt Knack    Any Other Special Instructions Will Be Listed Below (If Applicable).     If you need a refill on your cardiac medications before your next appointment, please call your pharmacy.

## 2015-12-04 NOTE — H&P (Addendum)
Cardiology Office Note Date:  12/09/2015   ID:  Stephanie Horne, DOB [birth year], MRN [id number]  PCP:  Duwaine Maxin, DO  Cardiologist:  [name], MD    Chief Complaint  Patient presents with  . Shortness of Breath    History of Present Illness: Stephanie Horne is a 75 y.o. female who presents for follow-up of atrial fibrillation, chronic diastolic heart failure, and severe mitral regurgitation.  The patient was unable to take warfarin. She had difficulty with monitoring of her INR and was not compliant with follow-up. Warfarin was discontinued a few years back. She was not felt to be a candidate for anticoagulation with a novel agent because of her valvular heart disease initially. She also has a vague history of bleeding problems but she denies any recent GI bleeding. She complains of progressive shortness of breath with low-level activity. She denies orthopnea or PND, but does admit to leg swelling. She's had no recent chest pain or pressure.   Past Medical History  Diagnosis Date  . Hyperlipidemia   . Hypertension   . Back pain, chronic   . Lupus ([place])     Treated with prednisone indefinitely   . Seasonal allergies   . Mitral regurgitation   . Atrial fibrillation ([place])     not on coumidin because AIHA and major bleeding complications  . Axillary lymphadenopathy     left negative MMG 1/08  . AIHA (autoimmune hemolytic anemia) ([place])     12/08 onset  . Diabetes mellitus   . Complicated grief [id number]    Death of husband in 01/15/2011   . MITRAL REGURGITATION, MODERATE 10/24/2006    2d Echo from Jan 15, 2008 - EF 60%, Moderated MR      Past Surgical History  Procedure Laterality Date  . Eye surgery      cataract in both eyes    No current facility-administered medications for this visit.   Current Outpatient Prescriptions  Medication Sig Dispense Refill  . [DISCONTINUED] Calcium Carbonate-Vitamin D (CALTRATE 600+D PO) Take 600 mg by mouth daily.     Facility-Administered  Medications Ordered in Other Visits  Medication Dose Route Frequency Provider Last Rate Last Dose  . 0.9 %  sodium chloride infusion   Intravenous Continuous [name], MD 20 mL/hr at 12/09/15 1048 500 mL at 12/09/15 1048    Allergies:   Metformin and related and Lisinopril   Social History:  The patient  reports that she has quit smoking. She does not have any smokeless tobacco history on file. She reports that she does not drink alcohol or use illicit drugs.   Family History:  The patient's  family history includes Heart disease in her mother.    ROS:  Please see the history of present illness.  Otherwise, review of systems is positive for  Shortness of breath, cough, back pain, muscle pain, joint swelling, leg pain.  All other systems are reviewed and negative.   PHYSICAL EXAM: VS:  BP 150/84 mmHg  Pulse 92  Ht 5' 10.5" (1.791 m)  Wt 184 lb (83.462 kg)  BMI 26.02 kg/m2 , BMI Body mass index is 26.02 kg/(m^2). GEN: Pleasant elderly woman in no acute distress HEENT: normal Neck: JVP is mildly elevated, no masses. No carotid bruits Cardiac:  Regularly irregular with a grade 3/6 holosystolic murmur best heard at the LV apex                Respiratory:  clear to auscultation bilaterally, normal work of  breathing GI: soft, nontender, nondistended, + BS MS: no deformity or atrophy Ext: 1+ bilateral pretibial edema Skin: warm and dry, no rash Neuro:  Strength and sensation are intact Psych: euthymic mood, full affect  EKG:  EKG is ordered today. The ekg ordered today shows  Atrial fibrillation 92 bpm , aberrantly conducted complexes are present, possible age-indeterminate septal infarct  Recent Labs: 12/04/2015: BUN 15; Creat 0.89; Hemoglobin 13.8; Platelets 153; Potassium 4.1; Sodium 144   Lipid Panel     Component Value Date/Time   CHOL 148 06/04/2014 1418   TRIG 147 06/04/2014 1418   HDL 47 06/04/2014 1418   CHOLHDL 3.1 06/04/2014 1418   VLDL 29 06/04/2014 1418    LDLCALC 72 06/04/2014 1418      Wt Readings from Last 3 Encounters:  12/09/15 165 lb (74.844 kg)  12/04/15 184 lb (83.462 kg)  09/30/15 188 lb 12.8 oz (85.639 kg)     Cardiac Studies Reviewed: 2D Echo 07-17-2014: Study Conclusions  - Left ventricle: The cavity size was normal. There was mild focal basal hypertrophy of the septum. Systolic function was normal. The estimated ejection fraction was in the range of 60% to 65%. Wall motion was normal; there were no regional wall motion abnormalities. Doppler parameters are consistent with elevated ventricular end-diastolic filling pressure. - Aortic valve: Trileaflet; normal thickness leaflets. There was no regurgitation. - Aortic root: The aortic root was normal in size. - Mitral valve: Myxomatous anterior leaflet of the mitral valve with prolapse. There is at least moderate and most probably severe mitral regurgitation with posteriorly directed jet. Transvalvular velocity was within the normal range. There was no evidence for stenosis. - Left atrium: The atrium was severely dilated. - Right ventricle: Systolic function was mildly reduced. - Right atrium: The atrium was severely dilated. - Tricuspid valve: There was mild-moderate regurgitation. - Pulmonary arteries: Systolic pressure was severely increased. PA peak pressure: 62 mm Hg (S). - Pericardium, extracardiac: There was no pericardial effusion.  Impressions:  - Normal LV size and systolic function. Elevated filling pressures. Severe bi-atrial dilatation. Anterior mitral valve leaflet prolapse with severe mitral regurgitation. Mildly impaired RV systolic function. Severe pulmonary hypertension.   ASSESSMENT AND PLAN: 1.  Chronic systolic heart failure, New York Heart Association functional class III: The patient has progressive symptoms of heart failure with known severe regurgitation. We discussed potential treatment options. It's  highly unlikely she would be a candidate for conventional surgery. However, she may be a candidate for mitra-clip. We discussed at length whether she would be interested in pursuing interventional therapies or whether she would prefer to continue with palliative medical therapy. I suspect she will continue to worsen with respect to her CHF and have recommended we at least do a TEE to better assess the mechanism of mitral regurgitation. She is in agreement. Risks and indications are reviewed with the patient who agrees to proceed.  2. Chronic atrial fibrillation: She has previously been noncompliant with warfarin. She has very high stroke risk. Have recommended that she start Eliquis 5 mg twice daily and discontinue aspirin.  3. Severe mitral regurgitation: As above  Current medicines are reviewed with the patient today.  The patient does not have concerns regarding medicines.  Labs/ tests ordered today include:   Orders Placed This Encounter  Procedures  . Basic Metabolic Panel (BMET)  . CBC  . B Nat Peptide  . EKG 12-Lead   Disposition:   FU 3 months  Signed, [name], MD  12/09/2015 1:05  PM    [facility] [address], [place], [place]  [address] Phone: [phone]; Fax: [phone]   Patient seen for TEE to assess mitral regurgitation.  No change to history/physical exam.   [name] 12/09/2015

## 2015-12-07 ENCOUNTER — Telehealth: Payer: Self-pay | Admitting: Cardiovascular Disease

## 2015-12-07 NOTE — Telephone Encounter (Signed)
Stephanie Horne called me back and I advised her of my prior message that was left on her VM. She verbalized understanding and thanked me for calling her back.

## 2015-12-07 NOTE — Telephone Encounter (Signed)
New message      Pt was seen last week.  Daughter said Dr Burt Knack was going to call her in a presc to prevent stroks----a blood thinner.  They went to the pharmacy and it was not there.  Please call it in to [facility] at [address].

## 2015-12-07 NOTE — Telephone Encounter (Signed)
**Note De-Identified  Obfuscation** I left a detailed message on Tara's, the pts daughter, VM stating that once Dr Burt Knack reviews the pts lab results we will send in the pts RX for Eliquis 5 mg to be taken BID.

## 2015-12-08 NOTE — Telephone Encounter (Signed)
Left message on machine for Stephanie Horne to contact the office.   Per 12/04/15 OV note: If lab results are okay then we will advise to STOP Aspirin and START Eliquis 5mg  take one tablet by mouth twice a day

## 2015-12-09 ENCOUNTER — Ambulatory Visit (HOSPITAL_BASED_OUTPATIENT_CLINIC_OR_DEPARTMENT_OTHER)
Admission: RE | Admit: 2015-12-09 | Discharge: 2015-12-09 | Disposition: A | Discharge status: Home / Self Care | Payer: Medicare Other | Source: Ambulatory Visit | Attending: Cardiovascular Disease | Admitting: Cardiovascular Disease

## 2015-12-09 ENCOUNTER — Ambulatory Visit (HOSPITAL_COMMUNITY)
Admission: RE | Admit: 2015-12-09 | Discharge: 2015-12-09 | Disposition: A | Discharge status: Home / Self Care | Payer: Medicare Other | Source: Ambulatory Visit | Attending: Cardiology | Admitting: Cardiology

## 2015-12-09 ENCOUNTER — Encounter (HOSPITAL_COMMUNITY)
Admission: RE | Disposition: A | Discharge status: Home / Self Care | Payer: Self-pay | Source: Ambulatory Visit | Attending: Cardiology

## 2015-12-09 ENCOUNTER — Encounter (HOSPITAL_COMMUNITY): Payer: Self-pay | Admitting: *Deleted

## 2015-12-09 ENCOUNTER — Encounter: Payer: Self-pay | Admitting: *Deleted

## 2015-12-09 DIAGNOSIS — E119 Type 2 diabetes mellitus without complications: Secondary | ICD-10-CM | POA: Diagnosis not present

## 2015-12-09 DIAGNOSIS — M329 Systemic lupus erythematosus, unspecified: Secondary | ICD-10-CM | POA: Insufficient documentation

## 2015-12-09 DIAGNOSIS — I081 Rheumatic disorders of both mitral and tricuspid valves: Secondary | ICD-10-CM | POA: Insufficient documentation

## 2015-12-09 DIAGNOSIS — Z7952 Long term (current) use of systemic steroids: Secondary | ICD-10-CM | POA: Diagnosis not present

## 2015-12-09 DIAGNOSIS — I340 Nonrheumatic mitral (valve) insufficiency: Secondary | ICD-10-CM

## 2015-12-09 DIAGNOSIS — I482 Chronic atrial fibrillation: Secondary | ICD-10-CM | POA: Diagnosis not present

## 2015-12-09 DIAGNOSIS — I110 Hypertensive heart disease with heart failure: Secondary | ICD-10-CM | POA: Diagnosis not present

## 2015-12-09 DIAGNOSIS — I5032 Chronic diastolic (congestive) heart failure: Secondary | ICD-10-CM | POA: Insufficient documentation

## 2015-12-09 DIAGNOSIS — E785 Hyperlipidemia, unspecified: Secondary | ICD-10-CM | POA: Diagnosis not present

## 2015-12-09 DIAGNOSIS — Z7951 Long term (current) use of inhaled steroids: Secondary | ICD-10-CM | POA: Diagnosis not present

## 2015-12-09 DIAGNOSIS — Z79899 Other long term (current) drug therapy: Secondary | ICD-10-CM | POA: Diagnosis not present

## 2015-12-09 DIAGNOSIS — Z87891 Personal history of nicotine dependence: Secondary | ICD-10-CM | POA: Insufficient documentation

## 2015-12-09 HISTORY — PX: TEE WITHOUT CARDIOVERSION: SHX5443

## 2015-12-09 LAB — GLUCOSE, CAPILLARY: Glucose-Capillary: 101 mg/dL — ABNORMAL HIGH (ref 65–99)

## 2015-12-09 SURGERY — ECHOCARDIOGRAM, TRANSESOPHAGEAL
Anesthesia: Moderate Sedation

## 2015-12-09 MED ORDER — FENTANYL CITRATE (PF) 100 MCG/2ML IJ SOLN
INTRAMUSCULAR | Status: AC
  Filled 2015-12-09: qty 2

## 2015-12-09 MED ORDER — FENTANYL CITRATE (PF) 100 MCG/2ML IJ SOLN
INTRAMUSCULAR | Status: DC | PRN
  Administered 2015-12-09: 25 ug via INTRAVENOUS

## 2015-12-09 MED ORDER — SODIUM CHLORIDE 0.9 % IV SOLN
INTRAVENOUS | Status: DC
  Administered 2015-12-09: 500 mL via INTRAVENOUS

## 2015-12-09 MED ORDER — MIDAZOLAM HCL 10 MG/2ML IJ SOLN
INTRAMUSCULAR | Status: DC | PRN
  Administered 2015-12-09: 2 mg via INTRAVENOUS

## 2015-12-09 MED ORDER — APIXABAN 5 MG PO TABS: 5.0000 mg | ORAL_TABLET | Freq: Two times a day (BID) | ORAL | Status: DC

## 2015-12-09 MED ORDER — MIDAZOLAM HCL 5 MG/ML IJ SOLN
INTRAMUSCULAR | Status: AC
  Filled 2015-12-09: qty 2

## 2015-12-09 MED ORDER — BUTAMBEN-TETRACAINE-BENZOCAINE 2-2-14 % EX AERO
INHALATION_SPRAY | CUTANEOUS | Status: DC | PRN
  Administered 2015-12-09: 2 via TOPICAL

## 2015-12-09 NOTE — Discharge Instructions (Signed)

## 2015-12-09 NOTE — CV Procedure (Signed)
Procedure: TEE  Indication: Mitral regurgitation.   Sedation: Versed 2 mg IV, Fentanyl 25 mcg IV  Findings: Please see echo section for full report.  Normal left ventricular size with mild LV hypertrophy.  EF 60-65%.  Normal RV size and systolic function.  Severe left atrial enlargement, no LA appendage thrombus.  Severe right atrial enlargement.  No ASD/PFO (negative bubble study).  Trileaflet aortic valve without stenosis or regurgitation.  Moderate tricuspid regurgitation with peak RV-RA gradient 51 mmHg.  There was severe mitral regurgitation with two jets noted, one larger than the other.  PISA was done on larger jet, ERO 0.42 cm^2. There was severe prolapse but not flail of the A1/A2 segments of the anterior leaflet.  This is where the mitral regurgitation originated.  There was systolic flattening of the pulmonary vein PW doppler tracing.  Normal caliber aorta with mild plaque.   Impression: Severe MR.  Stephanie Horne 12/09/2015 12:44 PM

## 2015-12-09 NOTE — Telephone Encounter (Signed)
I spoke with the pt's [name] and made her aware of lab results and that the pt can go ahead and make change in medication.  Eliquis Rx sent to the pharmacy and the pt will STOP aspirin when she starts this medication. The pt did have TEE performed today and I will await further instructions from Dr Burt Knack based on these results.

## 2015-12-09 NOTE — Telephone Encounter (Signed)
This encounter was created in error - please disregard.

## 2015-12-09 NOTE — Telephone Encounter (Signed)
got verbal permission from Ms. Loughry to speak with Stephanie Horne. just wanted lab results to see about starting eliquis or ASA as stated in [place] 12/04/15. will route to [employer].

## 2015-12-10 ENCOUNTER — Telehealth: Payer: Self-pay | Admitting: Cardiovascular Disease

## 2015-12-10 ENCOUNTER — Encounter (HOSPITAL_COMMUNITY): Payer: Self-pay | Admitting: Cardiology

## 2015-12-10 NOTE — Telephone Encounter (Signed)
New message      Calling to check the status on prior approval on her eliquis.  The drug store told pt they had to send the presc back to [place] for prior approval.  Please call

## 2015-12-11 NOTE — Telephone Encounter (Signed)
I have not received anything from her pharmacy, nor do I have her newest insurance info.

## 2015-12-13 LAB — ECHO TEE
MRPISAEROA: 0.42 cm2
MV VTI: 181 cm

## 2015-12-14 NOTE — Telephone Encounter (Signed)
Left voicemail message for [name], relative of Ms Crochet. I need fax from the pharmacy with her Insurance details.

## 2015-12-16 ENCOUNTER — Encounter: Payer: Self-pay | Admitting: Cardiovascular Disease

## 2015-12-16 NOTE — Telephone Encounter (Signed)
This encounter was created in error - please disregard.

## 2015-12-16 NOTE — Telephone Encounter (Signed)
Follow Up:; ° ° °Returning your call. °

## 2015-12-21 NOTE — Addendum Note (Signed)
Addended by: Hulan Fray on: 12/21/2015 05:58 PM   Modules accepted: Orders

## 2016-01-05 ENCOUNTER — Other Ambulatory Visit: Payer: Self-pay | Admitting: Cardiovascular Disease

## 2016-01-05 DIAGNOSIS — I340 Nonrheumatic mitral (valve) insufficiency: Secondary | ICD-10-CM

## 2016-01-07 ENCOUNTER — Ambulatory Visit (HOSPITAL_COMMUNITY)
Admission: RE | Admit: 2016-01-07 | Discharge: 2016-01-07 | Disposition: A | Discharge status: Home / Self Care | Payer: Medicare Other | Source: Ambulatory Visit | Attending: Cardiovascular Disease | Admitting: Cardiovascular Disease

## 2016-01-07 ENCOUNTER — Encounter (HOSPITAL_COMMUNITY)
Admission: RE | Disposition: A | Discharge status: Home / Self Care | Payer: Self-pay | Source: Ambulatory Visit | Attending: Cardiovascular Disease

## 2016-01-07 DIAGNOSIS — Z87891 Personal history of nicotine dependence: Secondary | ICD-10-CM | POA: Diagnosis not present

## 2016-01-07 DIAGNOSIS — I482 Chronic atrial fibrillation: Secondary | ICD-10-CM | POA: Insufficient documentation

## 2016-01-07 DIAGNOSIS — Z7901 Long term (current) use of anticoagulants: Secondary | ICD-10-CM | POA: Insufficient documentation

## 2016-01-07 DIAGNOSIS — I272 Other secondary pulmonary hypertension: Secondary | ICD-10-CM | POA: Diagnosis not present

## 2016-01-07 DIAGNOSIS — E785 Hyperlipidemia, unspecified: Secondary | ICD-10-CM | POA: Diagnosis not present

## 2016-01-07 DIAGNOSIS — Z79899 Other long term (current) drug therapy: Secondary | ICD-10-CM | POA: Insufficient documentation

## 2016-01-07 DIAGNOSIS — Z7952 Long term (current) use of systemic steroids: Secondary | ICD-10-CM | POA: Insufficient documentation

## 2016-01-07 DIAGNOSIS — I340 Nonrheumatic mitral (valve) insufficiency: Secondary | ICD-10-CM | POA: Diagnosis present

## 2016-01-07 DIAGNOSIS — I2510 Atherosclerotic heart disease of native coronary artery without angina pectoris: Secondary | ICD-10-CM | POA: Diagnosis not present

## 2016-01-07 DIAGNOSIS — I5032 Chronic diastolic (congestive) heart failure: Secondary | ICD-10-CM | POA: Insufficient documentation

## 2016-01-07 DIAGNOSIS — Z7951 Long term (current) use of inhaled steroids: Secondary | ICD-10-CM | POA: Insufficient documentation

## 2016-01-07 DIAGNOSIS — E119 Type 2 diabetes mellitus without complications: Secondary | ICD-10-CM | POA: Diagnosis not present

## 2016-01-07 DIAGNOSIS — M419 Scoliosis, unspecified: Secondary | ICD-10-CM | POA: Diagnosis not present

## 2016-01-07 DIAGNOSIS — I110 Hypertensive heart disease with heart failure: Secondary | ICD-10-CM | POA: Diagnosis not present

## 2016-01-07 DIAGNOSIS — M329 Systemic lupus erythematosus, unspecified: Secondary | ICD-10-CM | POA: Insufficient documentation

## 2016-01-07 HISTORY — PX: CARDIAC CATHETERIZATION: SHX172

## 2016-01-07 LAB — POCT I-STAT 3, ART BLOOD GAS (G3+)
ACID-BASE DEFICIT: 1 mmol/L (ref 0.0–2.0)
BICARBONATE: 24.8 meq/L — AB (ref 20.0–24.0)
O2 Saturation: 92 %
PO2 ART: 65 mmHg — AB (ref 80.0–100.0)
TCO2: 26 mmol/L (ref 0–100)
pCO2 arterial: 43.1 mmHg (ref 35.0–45.0)
pH, Arterial: 7.368 (ref 7.350–7.450)

## 2016-01-07 LAB — BASIC METABOLIC PANEL
Anion gap: 13 (ref 5–15)
BUN: 14 mg/dL (ref 6–20)
CHLORIDE: 108 mmol/L (ref 101–111)
CO2: 24 mmol/L (ref 22–32)
CREATININE: 0.72 mg/dL (ref 0.44–1.00)
Calcium: 9.1 mg/dL (ref 8.9–10.3)
GFR calc Af Amer: >60 mL/min (ref >60)
GFR calc non Af Amer: >60 mL/min (ref >60)
Glucose, Bld: 113 mg/dL — ABNORMAL HIGH (ref 65–99)
Potassium: 4 mmol/L (ref 3.5–5.1)
SODIUM: 145 mmol/L (ref 135–145)

## 2016-01-07 LAB — CBC
HCT: 42 % (ref 36.0–46.0)
Hemoglobin: 13.7 g/dL (ref 12.0–15.0)
MCH: 31.5 pg (ref 26.0–34.0)
MCHC: 32.6 g/dL (ref 30.0–36.0)
MCV: 96.6 fL (ref 78.0–100.0)
PLATELETS: 119 10*3/uL — AB (ref 150–400)
RBC: 4.35 MIL/uL (ref 3.87–5.11)
RDW: 13.8 % (ref 11.5–15.5)
WBC: 4.1 10*3/uL (ref 4.0–10.5)

## 2016-01-07 LAB — POCT I-STAT 3, VENOUS BLOOD GAS (G3P V)
Acid-Base Excess: 2 mmol/L (ref 0.0–2.0)
BICARBONATE: 27.5 meq/L — AB (ref 20.0–24.0)
O2 Saturation: 63 %
PCO2 VEN: 48.2 mmHg (ref 45.0–50.0)
TCO2: 29 mmol/L (ref 0–100)
pH, Ven: 7.365 — ABNORMAL HIGH (ref 7.250–7.300)
pO2, Ven: 34 mmHg (ref 31.0–45.0)

## 2016-01-07 LAB — GLUCOSE, CAPILLARY: Glucose-Capillary: 115 mg/dL — ABNORMAL HIGH (ref 65–99)

## 2016-01-07 LAB — PROTIME-INR
INR: 1.13 (ref 0.00–1.49)
Prothrombin Time: 14.7 seconds (ref 11.6–15.2)

## 2016-01-07 SURGERY — RIGHT/LEFT HEART CATH AND CORONARY ANGIOGRAPHY
Anesthesia: LOCAL

## 2016-01-07 MED ORDER — SODIUM CHLORIDE 0.9% FLUSH: 3.0000 mL | Freq: Two times a day (BID) | INTRAVENOUS | Status: DC

## 2016-01-07 MED ORDER — MIDAZOLAM HCL 2 MG/2ML IJ SOLN
INTRAMUSCULAR | Status: AC
  Filled 2016-01-07: qty 2

## 2016-01-07 MED ORDER — FENTANYL CITRATE (PF) 100 MCG/2ML IJ SOLN
INTRAMUSCULAR | Status: DC | PRN
  Administered 2016-01-07 (×2): 25 ug via INTRAVENOUS

## 2016-01-07 MED ORDER — SODIUM CHLORIDE 0.9 % IV SOLN: INTRAVENOUS | Status: AC

## 2016-01-07 MED ORDER — LIDOCAINE HCL (PF) 1 % IJ SOLN
INTRAMUSCULAR | Status: DC | PRN
  Administered 2016-01-07: 20 mL via SUBCUTANEOUS

## 2016-01-07 MED ORDER — SODIUM CHLORIDE 0.9% FLUSH: 3.0000 mL | INTRAVENOUS | Status: DC | PRN

## 2016-01-07 MED ORDER — SODIUM CHLORIDE 0.9 % IV SOLN: 250.0000 mL | INTRAVENOUS | Status: DC | PRN

## 2016-01-07 MED ORDER — IOPAMIDOL (ISOVUE-370) INJECTION 76%
INTRAVENOUS | Status: DC | PRN
  Administered 2016-01-07: 100 mL

## 2016-01-07 MED ORDER — HEPARIN (PORCINE) IN NACL 2-0.9 UNIT/ML-% IJ SOLN
INTRAMUSCULAR | Status: DC | PRN
  Administered 2016-01-07: 1000 mL

## 2016-01-07 MED ORDER — MIDAZOLAM HCL 2 MG/2ML IJ SOLN
INTRAMUSCULAR | Status: DC | PRN
  Administered 2016-01-07 (×2): 1 mg via INTRAVENOUS

## 2016-01-07 MED ORDER — NITROGLYCERIN 0.2 MG/ML ON CALL CATH LAB
INTRAVENOUS | Status: AC
  Filled 2016-01-07: qty 1

## 2016-01-07 MED ORDER — FENTANYL CITRATE (PF) 100 MCG/2ML IJ SOLN
INTRAMUSCULAR | Status: AC
  Filled 2016-01-07: qty 2

## 2016-01-07 MED ORDER — SODIUM CHLORIDE 0.9 % IV SOLN
INTRAVENOUS | Status: DC
  Administered 2016-01-07: 09:00:00 via INTRAVENOUS

## 2016-01-07 MED ORDER — IOPAMIDOL (ISOVUE-370) INJECTION 76%
INTRAVENOUS | Status: AC
  Filled 2016-01-07: qty 100

## 2016-01-07 SURGICAL SUPPLY — 11 items
CATH INFINITI 5FR MULTPACK ANG (CATHETERS) IMPLANT
CATH INFINITI MULTIPACK ANG 4F (CATHETERS) ×2 IMPLANT
CATH SWAN GANZ 7F STRAIGHT (CATHETERS) ×2 IMPLANT
KIT HEART LEFT (KITS) ×2 IMPLANT
PACK CARDIAC CATHETERIZATION (CUSTOM PROCEDURE TRAY) ×2 IMPLANT
SHEATH PINNACLE 4F 10CM (SHEATH) ×2 IMPLANT
SHEATH PINNACLE 7F 10CM (SHEATH) ×2 IMPLANT
SYR MEDRAD MARK V 150ML (SYRINGE) ×2 IMPLANT
TRANSDUCER W/STOPCOCK (MISCELLANEOUS) ×2 IMPLANT
WIRE EMERALD 3MM-J .025X260CM (WIRE) ×2 IMPLANT
WIRE EMERALD 3MM-J .035X150CM (WIRE) ×2 IMPLANT

## 2016-01-07 NOTE — Progress Notes (Signed)
Pressure held to groin site Eddie Dibbles, RN till [phone]. Site soft to touch. Pt denies pain or tenderness. BR restarted at 1630.

## 2016-01-07 NOTE — Progress Notes (Signed)
Small level 1 hematoma below site. Manual pressure X 10 minutes w/hematoma resolved. Bedrest restarts at 1545

## 2016-01-07 NOTE — Progress Notes (Signed)
Site area: rt groin Site Prior to Removal:  Level 0 Pressure Applied For:  20 minutes Manual:   yes Patient Status During Pull:  stable Post Pull Site:  Level  0 Post Pull Instructions Given:  yes Post Pull Pulses Present: yes Dressing Applied:  tegaderm Bedrest begins @ 1450 Comments:

## 2016-01-07 NOTE — H&P (Signed)
Cardiology Office Note Date: 12/09/2015   ID: VERNELL Horne, DOB 1941/06/07, MRN [id number]  PCP: Duwaine Maxin, DO Cardiologist: [name], MD   Chief Complaint  Patient presents with  . Shortness of Breath    History of Present Illness: Stephanie Horne is a 75 y.o. female who presents for follow-up of atrial fibrillation, chronic diastolic heart failure, and severe mitral regurgitation. The patient was unable to take warfarin. She had difficulty with monitoring of her INR and was not compliant with follow-up. Warfarin was discontinued a few years back. She was not felt to be a candidate for anticoagulation with a novel agent because of her valvular heart disease initially. She also has a vague history of bleeding problems but she denies any recent GI bleeding. She complains of progressive shortness of breath with low-level activity. She denies orthopnea or PND, but does admit to leg swelling. She's had no recent chest pain or pressure.   Past Medical History  Diagnosis Date  . Hyperlipidemia   . Hypertension   . Back pain, chronic   . Lupus ([place])     Treated with prednisone indefinitely   . Seasonal allergies   . Mitral regurgitation   . Atrial fibrillation ([place])     not on coumidin because AIHA and major bleeding complications  . Axillary lymphadenopathy     left negative MMG 1/08  . AIHA (autoimmune hemolytic anemia) ([place])     12/08 onset  . Diabetes mellitus   . Complicated grief [id number]    Death of husband in 01/29/11   . MITRAL REGURGITATION, MODERATE 10/24/2006    2d Echo from 2008-01-29 - EF 60%, Moderated MR     Past Surgical History  Procedure Laterality Date  . Eye surgery      cataract in both eyes    No current facility-administered medications for this visit.   Current Outpatient Prescriptions  Medication Sig Dispense Refill  . [DISCONTINUED] Calcium  Carbonate-Vitamin D (CALTRATE 600+D PO) Take 600 mg by mouth daily.     Facility-Administered Medications Ordered in Other Visits  Medication Dose Route Frequency Provider Last Rate Last Dose  . 0.9 % sodium chloride infusion  Intravenous Continuous [name], MD 20 mL/hr at 12/09/15 1048 500 mL at 12/09/15 1048    Allergies: Metformin and related and Lisinopril   Social History: The patient  reports that she has quit smoking. She does not have any smokeless tobacco history on file. She reports that she does not drink alcohol or use illicit drugs.   Family History: The patient's family history includes Heart disease in her mother.    ROS: Please see the history of present illness. Otherwise, review of systems is positive for Shortness of breath, cough, back pain, muscle pain, joint swelling, leg pain. All other systems are reviewed and negative.   PHYSICAL EXAM: VS: BP 150/84 mmHg  Pulse 92  Ht 5' 10.5" (1.791 m)  Wt 184 lb (83.462 kg)  BMI 26.02 kg/m2 , BMI Body mass index is 26.02 kg/(m^2). GEN: Pleasant elderly woman in no acute distress  HEENT: normal  Neck: JVP is mildly elevated, no masses. No carotid bruits Cardiac: Regularly irregular with a grade 3/6 holosystolic murmur best heard at the LV apex  Respiratory: clear to auscultation bilaterally, normal work of breathing GI: soft, nontender, nondistended, + BS MS: no deformity or atrophy  Ext: 1+ bilateral pretibial edema Skin: warm and dry, no rash Neuro: Strength and sensation are intact Psych: euthymic mood, full affect  EKG: EKG is ordered today. The ekg ordered today shows Atrial fibrillation 92 bpm , aberrantly conducted complexes are present, possible age-indeterminate septal infarct  Recent Labs: 12/04/2015: BUN 15; Creat 0.89; Hemoglobin 13.8; Platelets 153; Potassium 4.1; Sodium 144   Lipid Panel   Labs (Brief)       Component Value Date/Time    CHOL 148 06/04/2014 1418   TRIG 147 06/04/2014 1418   HDL 47 06/04/2014 1418   CHOLHDL 3.1 06/04/2014 1418   VLDL 29 06/04/2014 1418   LDLCALC 72 06/04/2014 1418       Wt Readings from Last 3 Encounters:  12/09/15 165 lb (74.844 kg)  12/04/15 184 lb (83.462 kg)  09/30/15 188 lb 12.8 oz (85.639 kg)     Cardiac Studies Reviewed: 2D Echo 07-17-2014: Study Conclusions  - Left ventricle: The cavity size was normal. There was mild focal basal hypertrophy of the septum. Systolic function was normal. The estimated ejection fraction was in the range of 60% to 65%. Wall motion was normal; there were no regional wall motion abnormalities. Doppler parameters are consistent with elevated ventricular end-diastolic filling pressure. - Aortic valve: Trileaflet; normal thickness leaflets. There was no regurgitation. - Aortic root: The aortic root was normal in size. - Mitral valve: Myxomatous anterior leaflet of the mitral valve with prolapse. There is at least moderate and most probably severe mitral regurgitation with posteriorly directed jet. Transvalvular velocity was within the normal range. There was no evidence for stenosis. - Left atrium: The atrium was severely dilated. - Right ventricle: Systolic function was mildly reduced. - Right atrium: The atrium was severely dilated. - Tricuspid valve: There was mild-moderate regurgitation. - Pulmonary arteries: Systolic pressure was severely increased. PA peak pressure: 62 mm Hg (S). - Pericardium, extracardiac: There was no pericardial effusion.  Impressions:  - Normal LV size and systolic function. Elevated filling pressures. Severe bi-atrial dilatation. Anterior mitral valve leaflet prolapse with severe mitral regurgitation. Mildly impaired RV systolic function. Severe pulmonary hypertension.   ASSESSMENT AND PLAN: 1. Chronic systolic heart failure, [place]  Heart Association functional class III: The patient has progressive symptoms of heart failure with known severe regurgitation. We discussed potential treatment options. It's highly unlikely she would be a candidate for conventional surgery. However, she may be a candidate for mitra-clip. We discussed at length whether she would be interested in pursuing interventional therapies or whether she would prefer to continue with palliative medical therapy. I suspect she will continue to worsen with respect to her CHF and have recommended we at least do a TEE to better assess the mechanism of mitral regurgitation. She is in agreement. Risks and indications are reviewed with the patient who agrees to proceed.  2. Chronic atrial fibrillation: She has previously been noncompliant with warfarin. She has very high stroke risk. Have recommended that she start Eliquis 5 mg twice daily and discontinue aspirin.  3. Severe mitral regurgitation: As above      ADDENDUM: See note above. Pt has undergone TEE, now presents for right and left heart cath for preoperative assessment for severe MR. No changes in symptoms or exam. Risks/indications of procedure reviewed with patient.   [name] 01/07/2016 1:06 PM

## 2016-01-07 NOTE — Discharge Instructions (Signed)
Angiogram, Care After °Refer to this sheet in the next few weeks. These instructions provide you with information about caring for yourself after your procedure. Your health care provider may also give you more specific instructions. Your treatment has been planned according to current medical practices, but problems sometimes occur. Call your health care provider if you have any problems or questions after your procedure. °WHAT TO EXPECT AFTER THE PROCEDURE °After your procedure, it is typical to have the following: °· Bruising at the catheter insertion site that usually fades within 1-2 weeks. °· Blood collecting in the tissue (hematoma) that may be painful to the touch. It should usually decrease in size and tenderness within 1-2 weeks. °HOME CARE INSTRUCTIONS °· Take medicines only as directed by your health care provider. °· You may shower 24-48 hours after the procedure or as directed by your health care provider. Remove the bandage (dressing) and gently wash the site with plain soap and water. Pat the area dry with a clean towel. Do not rub the site, because this may cause bleeding. °· Do not take baths, swim, or use a hot tub until your health care provider approves. °· Check your insertion site every day for redness, swelling, or drainage. °· Do not apply powder or lotion to the site. °· Do not lift over 10 lb (4.5 kg) for 5 days after your procedure or as directed by your health care provider. °· Ask your health care provider when it is okay to: °¨ Return to work or school. °¨ Resume usual physical activities or sports. °¨ Resume sexual activity. °· Do not drive home if you are discharged the same day as the procedure. Have someone else drive you. °· You may drive 24 hours after the procedure unless otherwise instructed by your health care provider. °· Do not operate machinery or power tools for 24 hours after the procedure or as directed by your health care provider. °· If your procedure was done as an  outpatient procedure, which means that you went home the same day as your procedure, a responsible adult should be with you for the first 24 hours after you arrive home. °· Keep all follow-up visits as directed by your health care provider. This is important. °SEEK MEDICAL CARE IF: °· You have a fever. °· You have chills. °· You have increased bleeding from the catheter insertion site. Hold pressure on the site. °SEEK IMMEDIATE MEDICAL CARE IF: °· You have unusual pain at the catheter insertion site. °· You have redness, warmth, or swelling at the catheter insertion site. °· You have drainage (other than a small amount of blood on the dressing) from the catheter insertion site. °· The catheter insertion site is bleeding, and the bleeding does not stop after 30 minutes of holding steady pressure on the site. °· The area near or just beyond the catheter insertion site becomes pale, cool, tingly, or numb. °  °This information is not intended to replace advice given to you by your health care provider. Make sure you discuss any questions you have with your health care provider. °  °Document Released: 04/07/2005 Document Revised: 10/10/2014 Document Reviewed: 02/20/2013 °Elsevier Interactive Patient Education ©2016 [employer] ° °

## 2016-01-08 ENCOUNTER — Telehealth: Payer: Self-pay | Admitting: Cardiovascular Disease

## 2016-01-08 ENCOUNTER — Encounter (HOSPITAL_COMMUNITY): Payer: Self-pay | Admitting: Cardiovascular Disease

## 2016-01-08 MED FILL — Nitroglycerin IV Soln 200 MCG/ML in D5W: INTRAVENOUS | Qty: 250 | Status: AC

## 2016-01-08 NOTE — Telephone Encounter (Signed)
New Message  Pt had a CATH. Insurance will not authorize Eliquis. Request a call back to discuss if Warfrin is an equal options

## 2016-01-08 NOTE — Telephone Encounter (Signed)
I spoke with [name] and made her aware that the pt can take Warfarin in the place of Eliquis.  I made her aware that the pt would require INR monitoring if she decides to take warfarin.  Per [name] the pt does not want to make frequent visits to the office for INR.  I will place a four week supply of samples at the front desk for pick-up.  Our office will follow-up on obtaining prior authorization for the pt's Eliquis. I contacted the pt's pharmacy and they said we need to contact [phone], patient ID # [id number].  I will forward this message to our prior authorization department.

## 2016-01-11 ENCOUNTER — Telehealth: Payer: Self-pay

## 2016-01-11 NOTE — Telephone Encounter (Signed)
Prior auth for Eliquis 5 mg submitted to [facility] today.

## 2016-01-12 ENCOUNTER — Telehealth: Payer: Self-pay

## 2016-01-12 NOTE — Telephone Encounter (Signed)
Daughter informed, Eliquis approved.

## 2016-01-12 NOTE — Telephone Encounter (Signed)
Eliquis 5mg  approved through 10/02/2016. [id number].

## 2016-01-13 NOTE — Telephone Encounter (Signed)
Eliquis approved

## 2016-01-18 ENCOUNTER — Other Ambulatory Visit: Payer: Self-pay | Admitting: *Deleted

## 2016-01-18 ENCOUNTER — Institutional Professional Consult (permissible substitution) (INDEPENDENT_AMBULATORY_CARE_PROVIDER_SITE_OTHER): Payer: Medicare Other | Admitting: Thoracic Surgery (Cardiothoracic Vascular Surgery)

## 2016-01-18 ENCOUNTER — Encounter: Payer: Self-pay | Admitting: Thoracic Surgery (Cardiothoracic Vascular Surgery)

## 2016-01-18 VITALS — BP 120/73 | HR 67 | Resp 16 | Ht 70.0 in | Wt 180.0 lb

## 2016-01-18 DIAGNOSIS — I340 Nonrheumatic mitral (valve) insufficiency: Secondary | ICD-10-CM

## 2016-01-18 DIAGNOSIS — I482 Chronic atrial fibrillation, unspecified: Secondary | ICD-10-CM

## 2016-01-18 DIAGNOSIS — I712 Thoracic aortic aneurysm, without rupture, unspecified: Secondary | ICD-10-CM

## 2016-01-18 DIAGNOSIS — I5022 Chronic systolic (congestive) heart failure: Secondary | ICD-10-CM | POA: Diagnosis not present

## 2016-01-18 DIAGNOSIS — I7409 Other arterial embolism and thrombosis of abdominal aorta: Secondary | ICD-10-CM

## 2016-01-18 NOTE — Progress Notes (Signed)
[address]       [place] [address]             [place] REPORT  Referring Provider is [name], MD PCP is Duwaine Maxin, DO  Chief Complaint  Patient presents with  . Mitral Regurgitation    eval for MINI MVR    HPI:  Patient is a 75 year old African-American female with history of mitral regurgitation, chronic persistent atrial fibrillation, chronic diastolic congestive heart failure, hypertension, lupus on long-term prednisone immunotherapy, rheumatoid arthritis, severe kyphoscoliosis with chronic back pain, autoimmune hemolytic anemia and remote history of GI bleeding on warfarin anticoagulation who has been referred for surgical consultation to discuss treatment options for management of severe symptomatic mitral regurgitation. Patient has a long history of heart murmur, mitral regurgitation, and atrial fibrillation but no reported history of rheumatic fever. She has been followed for mitral regurgitation with permanent atrial fibrillation for at least 15 years. She was anticoagulated using warfarin in the distant past but developed hemolytic anemia and GI bleeding complicated by problems with noncompliance with warfarin monitoring, and warfarin was discontinued nearly 10 years ago.  Echocardiograms dating back as far as 2007 revealed normal left ventricular systolic function with at least moderate mitral regurgitation. Since 2013 the patient has been followed by Dr. Burt Knack, and follow-up echocardiogram performed 07/17/2014 revealed normal left ventricular size and systolic function with ejection fraction estimated 60-65% and at least moderate but possibly severe mitral regurgitation. At that time there was severe left atrial enlargement, severe right atrial enlargement, and mild to moderate tricuspid regurgitation with findings were suggestive of severe pulmonary hypertension.  Concerns regarding the patient's  mitral regurgitation were discussed at length at that time, but the patient remained reluctant to consider any further diagnostic testing or definitive surgical intervention.  However, she eventually underwent transesophageal echocardiogram 12/09/2015. This confirmed the presence of normal left ventricular systolic function with severe mitral regurgitation and severe left atrial enlargement.  There was no evidence of thrombus in the left atrial appendage.  Left and right heart catheterization was performed 01/07/2016 by Dr. Burt Knack. The patient was found to have normal coronary arteries with mild nonobstructive atherosclerotic disease. There is normal left ventricular systolic function with severe mitral regurgitation. There was moderate pulmonary hypertension with PA pressures measured 56/26 and mean pulmonary capillary wedge pressure 17 mmHg.  The patient was referred for elective surgical consultation and just recently started on Eliquis for anticoagulation.  The patient is widowed and lives locally in [place] with her grandson.  She has a daughter who lives close by and has accompanied her for her consultation in the office today.  The patient remains reasonably independent, although she has been limited by progressively worsening problems with mobility due to chronic arthritis, scoliosis with chronic back pain, SLE, and worsening exertional SOB.  She ambulates using a rolling walker or cane when she goes out, but she remains quite sedentary and often times moves around her house in a rolling chair to avoid walking.  She minimizes the significance of her exertional shortness of breath although she admits to getting tired easily.  She denies any history of resting shortness of breath, PND, orthopnea, or exertional chest discomfort.  She has chronic bilateral LE edema, right>left.  She has chronic pain in her back, hands and leg, although her pain does not require the use of pain medications.  She takes  Goodies powders occasionally.  Past Medical History  Diagnosis Date  . Hyperlipidemia   . Hypertension   . Back pain, chronic   . Lupus ([place])     Treated with prednisone indefinitely   . Seasonal allergies   . Mitral regurgitation   . Atrial fibrillation ([place])     not on coumidin because AIHA and major bleeding complications  . Axillary lymphadenopathy     left negative MMG 1/08  . AIHA (autoimmune hemolytic anemia) ([place])     12/08 onset  . Diabetes mellitus   . Complicated grief [id number]    Death of husband in 01-27-11   . MITRAL REGURGITATION, MODERATE 10/24/2006    2d Echo from 01-27-2008 - EF 60%, Moderated MR      Past Surgical History  Procedure Laterality Date  . Eye surgery      cataract in both eyes  . Tee without cardioversion N/A 12/09/2015    Procedure: TRANSESOPHAGEAL ECHOCARDIOGRAM (TEE);  Surgeon: [name], MD;  Location: [facility] ENDOSCOPY;  Service: Cardiovascular;  Laterality: N/A;  . Cardiac catheterization N/A 01/07/2016    Procedure: Right/Left Heart Cath and Coronary Angiography;  Surgeon: [name], MD;  Location: [facility] CV LAB;  Service: Cardiovascular;  Laterality: N/A;    Family History  Problem Relation Age of Onset  . Heart disease Mother     Social History   Social History  . Marital Status: Married    Spouse Name: N/A  . Number of Children: N/A  . Years of Education: N/A   Occupational History  . Not on file.   Social History Main Topics  . Smoking status: Former Research scientist (life sciences)  . Smokeless tobacco: Not on file  . Alcohol Use: No  . Drug Use: No  . Sexual Activity: Not on file     Comment: married, retired Animal nutritionist, no smoking, alochol, illegal drug, her husband was clinic patient and died of lung cancer   Other Topics Concern  . Not on file   Social History Narrative    Current Outpatient Prescriptions  Medication Sig Dispense Refill  . apixaban (ELIQUIS) 5 MG TABS tablet Take 1 tablet (5 mg total) by mouth 2 (two)  times daily. 60 tablet 6  . Calcium Carbonate-Vitamin D (CALTRATE 600+D) 600-400 MG-UNIT per tablet Take 1 tablet by mouth 3 (three) times daily with meals. 90 tablet 5  . fluticasone (FLONASE) 50 MCG/ACT nasal spray Place 2 sprays into both nostrils daily. 16 g 1  . furosemide (LASIX) 40 MG tablet TAKE 1 TABLET (40 MG TOTAL) BY MOUTH DAILY. 90 tablet 1  . glucose blood (ONE TOUCH ULTRA TEST) test strip Use to check blood sugar 1 to 2 times daily. diag code E11.9. Non insulin dependent 100 each 3  . levocetirizine (XYZAL) 5 MG tablet Take 0.5 tablets (2.5 mg total) by mouth every evening. 30 tablet 1  . losartan (COZAAR) 100 MG tablet TAKE 1 TABLET BY MOUTH EVERY DAY 90 tablet 1  . metoprolol (LOPRESSOR) 50 MG tablet TAKE 1 TABLET (50 MG TOTAL) BY MOUTH 2 (TWO) TIMES DAILY. 180 tablet 1  . nitroGLYCERIN (NITROSTAT) 0.4 MG SL tablet Place 0.4 mg under the tongue every 5 (five) minutes as needed. Reported on 09/30/2015    . ONETOUCH DELICA LANCETS FINE MISC 1 each by Does not apply route daily. 100 each 3  . Polyethyl Glycol-Propyl Glycol (SYSTANE OP) Place 1 drop into both eyes daily.    . pravastatin (PRAVACHOL) 40 MG tablet TAKE 1 TABLET (40 MG  TOTAL) BY MOUTH DAILY. 30 tablet 3  . predniSONE (DELTASONE) 10 MG tablet TAKE 1 TABLET (10 MG TOTAL) BY MOUTH DAILY WITH BREAKFAST. 90 tablet 1  . traZODone (DESYREL) 50 MG tablet TAKE 1 TABLET (50 MG TOTAL) BY MOUTH AT BEDTIME AS NEEDED FOR SLEEP. 90 tablet 1  . diclofenac sodium (VOLTAREN) 1 % GEL Apply 1 application topically 2 (two) times daily as needed. For leg, left hip and foot pain (Patient not taking: Reported on 01/06/2016) 1 Tube 5  . [DISCONTINUED] Calcium Carbonate-Vitamin D (CALTRATE 600+D PO) Take 600 mg by mouth daily.     No current facility-administered medications for this visit.    Allergies  Allergen Reactions  . Metformin And Related     Diarrhea that was felt to be 2/2 metformin so trial of it was attempted.  . Lisinopril      cough  . Pollen Extract Other (See Comments)    Congestion, runny nose, phlegm      Review of Systems:   General:  normal appetite, decreased energy, no weight gain, no weight loss, no fever  Cardiac:  no chest pain with exertion, no chest pain at rest, + SOB with exertion, no resting SOB, no PND, no orthopnea, no palpitations, + arrhythmia, + atrial fibrillation, + LE edema, no dizzy spells, no syncope  Respiratory:  + shortness of breath, no home oxygen, no productive cough, no dry cough, no bronchitis, no wheezing, no hemoptysis, no asthma, no pain with inspiration or cough, no sleep apnea, no CPAP at night  GI:   no difficulty swallowing, no reflux, no frequent heartburn, no hiatal hernia, no abdominal pain, no constipation, no diarrhea, no hematochezia, no hematemesis, no melena  GU:   no dysuria,  no frequency, no urinary tract infection, no hematuria, no kidney stones, no kidney disease  Vascular:  no pain suggestive of claudication, no pain in feet, no leg cramps, no varicose veins, no DVT, no non-healing foot ulcer  Neuro:   no stroke, ? possible TIA in 2009, no seizures, no headaches, no temporary blindness one eye,  no slurred speech, no peripheral neuropathy, + chronic pain, + instability of gait, no memory/cognitive dysfunction  Musculoskeletal: + severe chronic arthritis, + joint swelling, no myalgias, + difficulty walking, very limited mobility   Skin:   no rash, no itching, no skin infections, no pressure sores or ulcerations  Psych:   no anxiety, no depression, no nervousness, no unusual recent stress  Eyes:   no blurry vision, no floaters, no recent vision changes, + wears glasses   ENT:   no hearing loss, no loose or painful teeth, edentulous with full set dentures  Hematologic:  no easy bruising, no abnormal bleeding, no clotting disorder, no frequent epistaxis  Endocrine:  + diabetes, does not check CBG's at home     Physical Exam:   BP 120/73 mmHg  Pulse 67  Resp 16   Ht 5\' 10"  (1.778 m)  Wt 180 lb (81.647 kg)  BMI 25.83 kg/m2  SpO2 94%  General:  Obese female NAD    HEENT:  Unremarkable   Neck:   no JVD, no bruits, no adenopathy   Chest:   clear to auscultation, symmetrical breath sounds, no wheezes, no rhonchi   CV:   Irregular rate and rhythm, grade III/VI holosystolic murmur   Abdomen:  soft, non-tender, no masses   Extremities:  warm, well-perfused, pulses not palpable, bilateral LE edema R>L  Rectal/GU  Deferred  Neuro:  Grossly non-focal and symmetrical throughout  Skin:   Clean and dry, no rashes, no breakdown   Diagnostic Tests:  Transthoracic Echocardiography  Patient:  Nimra, Lass MR #:    [id number] Study Date: 07/17/2014 Gender:   F Age:    52 Height:   177.8 cm Weight:   87.1 kg BSA:    2.09 m^2 Pt. Status: Room:  Leanna Battles D SONOGRAPHER Donata Clay ATTENDING  Francesca Oman REFERRING  Francesca Oman PERFORMING  [facility], Inpatient  cc:  ------------------------------------------------------------------- LV EF: 60% -  65%  ------------------------------------------------------------------- Indications:   Dyspnea 786.09.  ------------------------------------------------------------------- History:  PMH:  Atrial fibrillation. Risk factors: Hypertension. Diabetes mellitus. Dyslipidemia.  ------------------------------------------------------------------- Study Conclusions  - Left ventricle: The cavity size was normal. There was mild focal basal hypertrophy of the septum. Systolic function was normal. The estimated ejection fraction was in the range of 60% to 65%. Wall motion was normal; there were no regional wall motion abnormalities. Doppler parameters are consistent with elevated ventricular end-diastolic filling pressure. - Aortic valve: Trileaflet; normal thickness leaflets. There was  no regurgitation. - Aortic root: The aortic root was normal in size. - Mitral valve: Myxomatous anterior leaflet of the mitral valve with prolapse. There is at least moderate and most probably severe mitral regurgitation with posteriorly directed jet. Transvalvular velocity was within the normal range. There was no evidence for stenosis. - Left atrium: The atrium was severely dilated. - Right ventricle: Systolic function was mildly reduced. - Right atrium: The atrium was severely dilated. - Tricuspid valve: There was mild-moderate regurgitation. - Pulmonary arteries: Systolic pressure was severely increased. PA peak pressure: 62 mm Hg (S). - Pericardium, extracardiac: There was no pericardial effusion.  Impressions:  - Normal LV size and systolic function. Elevated filling pressures. Severe bi-atrial dilatation. Anterior mitral valve leaflet prolapse with severe mitral regurgitation. Mildly impaired RV systolic function. Severe pulmonary hypertension.  Transthoracic echocardiography. M-mode, complete 2D, spectral Doppler, and color Doppler. Birthdate: Patient birthdate: Feb 05, 1941. Age: Patient is 75 yr old. Sex: Gender: female. BMI: 27.5 kg/m^2. Blood pressure:   152/66 Patient status: Observation. Study date: Study date: 07/17/2014. Study time: 10:19 AM. Location: Echo laboratory.  -------------------------------------------------------------------  ------------------------------------------------------------------- Left ventricle: The cavity size was normal. There was mild focal basal hypertrophy of the septum. Systolic function was normal. The estimated ejection fraction was in the range of 60% to 65%. Wall motion was normal; there were no regional wall motion abnormalities. The study was not technically sufficient to allow evaluation of LV diastolic dysfunction due to atrial fibrillation. Doppler parameters are consistent with  elevated ventricular end-diastolic filling pressure.  ------------------------------------------------------------------- Aortic valve:  Trileaflet; normal thickness leaflets. Mobility was not restricted. Doppler: Transvalvular velocity was within the normal range. There was no stenosis. There was no regurgitation.  ------------------------------------------------------------------- Aorta: Aortic root: The aortic root was normal in size.  ------------------------------------------------------------------- Mitral valve: Myxomatous anterior leaflet of the mitral valve with prolapse. There is at least moderate and most probably severe mitral regurgitation with posteriorly directed jet. Structurally normal valve.  Mobility was not restricted. Doppler: Transvalvular velocity was within the normal range. There was no evidence for stenosis.  Peak gradient (D): 9 mm Hg.  ------------------------------------------------------------------- Left atrium: The atrium was severely dilated.  ------------------------------------------------------------------- Right ventricle: The cavity size was normal. Wall thickness was normal. Systolic function was mildly reduced.  ------------------------------------------------------------------- Pulmonic valve:  Structurally normal valve.  Cusp separation was normal. Doppler: Transvalvular velocity was within the  normal range. There was no evidence for stenosis. There was no regurgitation.  ------------------------------------------------------------------- Tricuspid valve:  Structurally normal valve.  Doppler: Transvalvular velocity was within the normal range. There was mild-moderate regurgitation.  ------------------------------------------------------------------- Pulmonary artery:  The main pulmonary artery was normal-sized. Systolic pressure was severely  increased.  ------------------------------------------------------------------- Right atrium: The atrium was severely dilated.  ------------------------------------------------------------------- Pericardium: There was no pericardial effusion.  ------------------------------------------------------------------- Systemic veins: Inferior vena cava: The vessel was dilated. The respirophasic diameter changes were blunted (< 50%), consistent with elevated central venous pressure.  ------------------------------------------------------------------- Measurements  Left ventricle               Value    Reference LV ID, ED, PLAX chordal       (L)   42.7 mm   43 - 52 LV ID, ES, PLAX chordal           30.6 mm   23 - 38 LV fx shortening, PLAX chordal   (L)   28  %   >=29 LV PW thickness, ED             12.6 mm   --------- IVS/LV PW ratio, ED             0.77     <=1.3 LV e&', lateral               12.6 cm/s  --------- LV E/e&', lateral              12.14    --------- LV e&', medial                9.9  cm/s  --------- LV E/e&', medial               15.45    --------- LV e&', average               11.25 cm/s  --------- LV E/e&', average              13.6     ---------  Ventricular septum             Value    Reference IVS thickness, ED              9.68 mm   ---------  Aorta                    Value    Reference Aortic root ID, ED             30  mm   ---------  Left atrium                 Value    Reference LA ID, A-P, ES               57  mm   --------- LA ID/bsa, A-P           (H)   2.73 cm/m^2 <=2.2 LA volume, ES, 1-p A4C           138  ml    --------- LA volume/bsa, ES, 1-p A4C         66  ml/m^2 --------- LA volume, ES, 1-p A2C           123  ml   --------- LA volume/bsa, ES, 1-p A2C         58.8 ml/m^2 --------- LA volume/bsa, ES, 2-p           66.3 ml/m^2 ---------  Mitral  valve                Value    Reference Mitral E-wave peak velocity         153  cm/s  --------- Mitral deceleration time      (L)   127  ms   150 - 230 Mitral peak gradient, D           9   mm Hg --------- Mitral maximal regurg velocity,       575  cm/s  --------- PISA Mitral regurg VTI, PISA           184  cm   --------- Mitral ERO, PISA              0.21 cm^2  --------- Mitral regurg volume, PISA         39  ml   ---------  Pulmonary arteries             Value    Reference PA pressure, S, DP         (H)   62  mm Hg <=30  Tricuspid valve               Value    Reference Tricuspid regurg peak velocity       343  cm/s  --------- Tricuspid peak RV-RA gradient        47  mm Hg ---------  Systemic veins               Value    Reference Estimated CVP                15  mm Hg ---------  Right ventricle               Value    Reference RV pressure, S, DP         (H)   62  mm Hg <=30 RV s&', lateral, S              10.9 cm/s  ---------  Legend: (L) and (H) mark values outside specified reference range.  ------------------------------------------------------------------- Prepared and Electronically Authenticated by  Ena Dawley, M.D. 2015-10-15T17:10:52    Transesophageal Echocardiography  Patient: Ekam, Kui MR #: [id number] Study Date: 12/09/2015 Gender: F Age: 29 Height: 177.8  cm Weight: 74.8 kg BSA: 1.93 m^2 Pt. Status: Room:  SONOGRAPHER Florentina Jenny, RDCS ORDERING [name], MD PERFORMING Loralie Champagne, M.D.  cc:  ------------------------------------------------------------------- LV EF: 60% - 65%  ------------------------------------------------------------------- Indications: Dyspnea 786.09. Mitral regurgitation 424.0.  ------------------------------------------------------------------- Study Conclusions  - Left ventricle: The cavity size was normal. Wall thickness was  increased in a pattern of mild LVH. Systolic function was normal.  The estimated ejection fraction was in the range of 60% to 65%.  Wall motion was normal; there were no regional wall motion  abnormalities. - Aortic valve: There was no stenosis. - Aorta: Normal caliber aorta with mild plaque. - Mitral valve: There was severe mitral regurgitation with two jets  noted, one larger than the other. PISA was done on larger jet,  ERO 0.42 cm^2. There was severe prolapse but not flail of the  A1/A2 segments of the anterior leaflet. This is where the mitral  regurgitation originated. There was systolic flattening of the  pulmonary vein PW doppler tracing. - Left atrium: The atrium was severely dilated. No evidence of  thrombus in the atrial cavity or appendage. - Right ventricle: The cavity size was normal. Systolic function  was normal. - Right atrium: The atrium was mildly dilated. - Atrial septum: No defect or patent foramen ovale was identified.  Echo contrast study showed no right-to-left atrial level shunt,  at baseline or with provocation. - Tricuspid valve: There was moderate regurgitation. Peak RV-RA  gradient (S): 51 mm Hg.  Diagnostic transesophageal echocardiography. 2D and color Doppler. Birthdate: Patient birthdate: [birth year]. Age: Patient is 75 yr old. Sex: Gender: female. BMI: 23.7 kg/m^2. Blood  pressure:  143/78 Patient status: Outpatient. Study date: Study date: 12/09/2015. Study time: 12:05 PM. Location: Endoscopy.  -------------------------------------------------------------------  ------------------------------------------------------------------- Left ventricle: The cavity size was normal. Wall thickness was increased in a pattern of mild LVH. Systolic function was normal. The estimated ejection fraction was in the range of 60% to 65%. Wall motion was normal; there were no regional wall motion abnormalities.  ------------------------------------------------------------------- Aortic valve: Trileaflet. Doppler: There was no stenosis. There was no regurgitation.  ------------------------------------------------------------------- Aorta: Normal caliber aorta with mild plaque.  ------------------------------------------------------------------- Mitral valve: There was severe mitral regurgitation with two jets noted, one larger than the other. PISA was done on larger jet, ERO 0.42 cm^2. There was severe prolapse but not flail of the A1/A2 segments of the anterior leaflet. This is where the mitral regurgitation originated. There was systolic flattening of the pulmonary vein PW doppler tracing. Doppler: There was no evidence for stenosis.  ------------------------------------------------------------------- Left atrium: The atrium was severely dilated. No evidence of thrombus in the atrial cavity or appendage.  ------------------------------------------------------------------- Atrial septum: No defect or patent foramen ovale was identified. Echo contrast study showed no right-to-left atrial level shunt, at baseline or with provocation.  ------------------------------------------------------------------- Right ventricle: The cavity size was normal. Systolic function  was normal.  ------------------------------------------------------------------- Pulmonic valve: Structurally normal valve. Cusp separation was normal.  ------------------------------------------------------------------- Tricuspid valve: Doppler: There was moderate regurgitation.  ------------------------------------------------------------------- Right atrium: The atrium was mildly dilated.  ------------------------------------------------------------------- Pericardium: There was no pericardial effusion.  ------------------------------------------------------------------- Post procedure conclusions Ascending Aorta:  - Normal caliber aorta with mild plaque.  ------------------------------------------------------------------- Measurements  Mitral valve Value Mitral maximal regurg velocity, PISA 572 cm/s Mitral regurg VTI, PISA 181 cm Mitral ERO, PISA 0.42 cm^2 Mitral regurg volume, PISA 76 ml  Tricuspid valve Value Tricuspid regurg peak velocity 357 cm/s Tricuspid peak RV-RA gradient 51 mm Hg  Legend: (L) and (H) mark values outside specified reference range.  ------------------------------------------------------------------- Prepared and Electronically Authenticated by  Loralie Champagne, M.D. 2017-03-12T09:52:34    CARDIAC CATHETERIZATION Procedures    Right/Left Heart Cath and Coronary Angiography    Conclusion     Mid RCA lesion, 20% stenosed.  Ost LAD to Prox LAD lesion, 25% stenosed.  1. Patent coronary arteries with mild nonobstructive CAD 2. Normal LV systolic function 3. Severe mitral regurgitation 4. Moderate pulmonary HTN 5. Patent abdominal aorta and iliac arteries (severe scoliosis noted)  Continue evaluation for mitral valve surgery versus  Mitra-Clip      Indications    Severe mitral regurgitation [I34.0 (ICD-10-CM)]    Technique and Indications    INDICATION: Severe mitral regurgitation with NYHA III CHF  PROCEDURAL DETAILS: The right groin was prepped, draped, and anesthetized with 1% lidocaine. Using the modified Seldinger technique a 4 French sheath was placed in the right femoral artery and a 7 French sheath was placed in the right femoral vein. A Swan-Ganz catheter was used for the right heart catheterization. The catheter is difficult to advance to the PA and PCWP positions because of marked atrial enlargement. Standard protocol was followed for recording  of right heart pressures and sampling of oxygen saturations. Fick cardiac output was calculated. Standard Judkins catheters were used for selective coronary angiography and left ventriculography. Abdominal aortic angiography was performed. There were no immediate procedural complications. The patient was transferred to the post catheterization recovery area for further monitoring.  During this procedure the patient is administered a total of Versed 2 mg and Fentanyl 50 mg to achieve and maintain moderate conscious sedation. The patient's heart rate, blood pressure, and oxygen saturation are monitored continuously during the procedure. The period of conscious sedation is 43 minutes, of which I was present face-to-face 100% of this time. Estimated blood loss <50 mL. There were no immediate complications during the procedure.    Coronary Findings    Dominance: Right   Left Anterior Descending   . Ost LAD to Prox LAD lesion, 25% stenosed. Diffuse. Diffuse irregularity of the proximal LAD     Ramus Intermedius  The intermediate branch is patent without obstruction     Left Circumflex  The circumflex is patent without obstruction     Right Coronary Artery  Mild luminal irregularities in the mid-RCA. Large dominant vessel without significant obstruction.   .  Mid RCA lesion, 20% stenosed. Diffuse.      Wall Motion                 Left Heart    Mitral Valve There is severe (4+) mitral regurgitation. The annulus is normal. There is 4+ mitral regurgitation. I do not appreciate any significant annular calcification on fluoroscopy    Coronary Diagrams    Diagnostic Diagram            Implants     No implant documentation for this case.    PACS Images    Show images for Cardiac catheterization     Link to Procedure Log    Procedure Log      Hemo Data       Most Recent Value   Fick Cardiac Output  4.8 L/min   Fick Cardiac Output Index  2.46 (L/min)/BSA   RA A Wave  8 mmHg   RA V Wave  16 mmHg   RA Mean  9 mmHg   RV Systolic Pressure  55 mmHg   RV Diastolic Pressure  7 mmHg   RV EDP  9 mmHg   PA Systolic Pressure  56 mmHg   PA Diastolic Pressure  26 mmHg   PA Mean  37 mmHg   PW A Wave  18 mmHg   PW V Wave  19 mmHg   PW Mean  17 mmHg   AO Systolic Pressure  123XX123 mmHg   AO Diastolic Pressure  78 mmHg   AO Mean  [id number] mmHg   LV Systolic Pressure  123456 mmHg   LV Diastolic Pressure  8 mmHg   LV EDP  17 mmHg   Arterial Occlusion Pressure Extended Systolic Pressure  88 mmHg   Arterial Occlusion Pressure Extended Diastolic Pressure  83 mmHg   Arterial Occlusion Pressure Extended Mean Pressure  91 mmHg   Left Ventricular Apex Extended Systolic Pressure  [id number] mmHg   Left Ventricular Apex Extended Diastolic Pressure  16 mmHg   Left Ventricular Apex Extended EDP Pressure  18 mmHg   QP/QS  1   TPVR Index  15.03 HRUI   TSVR Index  43.06 HRUI   PVR SVR Ratio  0.21   TPVR/TSVR Ratio  0.35      STS Risk  Calculator  Procedure    Mitral valve repair  Risk of Mortality   2.8% Morbidity or Mortality  25.3% Prolonged LOS   14.9% Short LOS    17.6% Permanent Stroke   2.3% Prolonged Vent Support  13.1% DSW Infection    0.3% Renal  Failure    9.8% Reoperation    7.6%     Impression:  Patient has stage D severe symptomatic primary mitral regurgitation. She has been followed for many years with slowly progressing symptoms of exertional shortness of breath and fatigue consistent with chronic diastolic congestive heart failure that are undoubtedly multifactorial and related to the patient's mitral regurgitation, long-standing hypertension, and chronic persistent atrial fibrillation.  It is difficult to tell for certain the patient's CHF class as her sedentary lifestyle limits her activity considerably, but she clearly experiences shortness of breath and fatigue with relatively low level activity, consistent with New York Heart Association functional class IIB or III.  Her functional status and mobility are also dramatically affected by problems with chronic pain due to severe kyphoscoliosis of the spine, lupus, and rheumatoid arthritis. I have personally reviewed the patient's recent transthoracic and transesophageal echocardiograms and diagnostic cardiac catheterization. Functional anatomy of the patient's mitral valve disease appears to be primarily degenerative with severe mitral annular enlargement and an overriding anterior leaflet with type II dysfunction and severe regurgitation that courses posteriorly around the left atrium.  There are no ruptured chordae tendineae and there is some thickening and fibrosis of the free margin of the anterior leaflet. There may be some restriction of the posterior leaflet.  The possibility of an underlying inflammatory component to the patient's valvular pathology remains possible, such as that seen with rheumatic disease or lupus, but I suspect the etiology is primarily degenerative based on the images from recent TEE.  Overall I would expect that her mitral valve should be repairable, but there remains a possibility that valve replacement might be necessary.  Left ventricular systolic function  appears reasonably well preserved. There is severe left and right atrial enlargement with long-standing persistent atrial fibrillation. There is moderate pulmonary hypertension, normal right ventricular size and function, and mild to moderate tricuspid regurgitation. The patient does not have significant coronary artery disease.    Overall I agree that the patient would benefit from mitral valve repair or replacement.  Concomitant maze procedure with clipping of LA appendage might decrease long term risks of thromboembolism and stroke and improve rhythm control, although given the patient's long duration of atrial fibrillation she might be at increased risk for needing a permanent pacemaker and/or the development of recurrent atrial fibrillation following maze.  She might need concomitant tricuspid valve repair.  Risks associated with surgery through either a minimally invasive approach or conventional median sternotomy would be increased substantially because of the patient's physical deconditioning, limited mobility and long-term immunosuppression.  Palliative percutaneous edge to edge mitral valve repair could be considered if the patient's surgical risks appear prohibitive, although functional anatomy does not appear ideal for the MitraClip.    Plan:  The patient and her daughter were counseled at length regarding the indications, risks and potential benefits of mitral valve repair or replacement.  The rationale for elective surgery has been explained, including a comparison between surgery and continued medical therapy with close follow-up.  Implications regarding the patient's numerous comorbid medical problems and limited physical mobility were discussed. To further characterize the risks of surgery we plan to proceed with formal physical therapy evaluation to get an  objective assessment of the patient's current physical mobility and degree of deconditioning. We will obtain pulmonary function tests  and a CT angiogram. The patient will discuss matters at length with her family and return in 2 weeks to discuss treatment options further.  All of their questions have been addressed.    I spent in excess of 90 minutes during the conduct of this office consultation and >50% of this time involved direct face-to-face encounter with the patient for counseling and/or coordination of their care.    Valentina Gu. Roxy Manns, MD 01/18/2016 11:27 AM

## 2016-01-18 NOTE — Patient Instructions (Signed)
Continue all previous medications without any changes at this time  

## 2016-01-27 ENCOUNTER — Ambulatory Visit: Payer: Medicare Other | Admitting: Physical Therapy

## 2016-02-03 ENCOUNTER — Ambulatory Visit
Admission: RE | Admit: 2016-02-03 | Discharge: 2016-02-03 | Disposition: A | Discharge status: Home / Self Care | Payer: Medicare Other | Source: Ambulatory Visit | Attending: Thoracic Surgery (Cardiothoracic Vascular Surgery) | Admitting: Thoracic Surgery (Cardiothoracic Vascular Surgery)

## 2016-02-03 ENCOUNTER — Ambulatory Visit (HOSPITAL_COMMUNITY)
Admission: RE | Admit: 2016-02-03 | Discharge: 2016-02-03 | Disposition: A | Discharge status: Home / Self Care | Payer: Medicare Other | Source: Ambulatory Visit | Attending: Thoracic Surgery (Cardiothoracic Vascular Surgery) | Admitting: Thoracic Surgery (Cardiothoracic Vascular Surgery)

## 2016-02-03 DIAGNOSIS — I712 Thoracic aortic aneurysm, without rupture, unspecified: Secondary | ICD-10-CM

## 2016-02-03 DIAGNOSIS — I7409 Other arterial embolism and thrombosis of abdominal aorta: Secondary | ICD-10-CM

## 2016-02-03 DIAGNOSIS — I745 Embolism and thrombosis of iliac artery: Secondary | ICD-10-CM | POA: Diagnosis not present

## 2016-02-03 DIAGNOSIS — I340 Nonrheumatic mitral (valve) insufficiency: Secondary | ICD-10-CM | POA: Diagnosis not present

## 2016-02-03 DIAGNOSIS — I4891 Unspecified atrial fibrillation: Secondary | ICD-10-CM | POA: Diagnosis not present

## 2016-02-03 DIAGNOSIS — I714 Abdominal aortic aneurysm, without rupture: Secondary | ICD-10-CM | POA: Diagnosis not present

## 2016-02-03 LAB — PULMONARY FUNCTION TEST
DL/VA % pred: 41 %
DL/VA: 2.24 ml/min/mmHg/L
DLCO UNC % PRED: 24 %
DLCO unc: 7.88 ml/min/mmHg
FEF 25-75 Post: 0.77 L/sec
FEF 25-75 Pre: 0.69 L/sec
FEF2575-%CHANGE-POST: 11 %
FEF2575-%PRED-POST: 40 %
FEF2575-%Pred-Pre: 36 %
FEV1-%CHANGE-POST: 4 %
FEV1-%PRED-PRE: 60 %
FEV1-%Pred-Post: 63 %
FEV1-POST: 1.4 L
FEV1-Pre: 1.34 L
FEV1FVC-%CHANGE-POST: 12 %
FEV1FVC-%Pred-Pre: 87 %
FEV6-%Change-Post: -5 %
FEV6-%Pred-Post: 68 %
FEV6-%Pred-Pre: 72 %
FEV6-PRE: 1.99 L
FEV6-Post: 1.88 L
FEV6FVC-%Change-Post: 1 %
FEV6FVC-%PRED-PRE: 102 %
FEV6FVC-%Pred-Post: 103 %
FVC-%CHANGE-POST: -6 %
FVC-%PRED-POST: 66 %
FVC-%Pred-Pre: 71 %
FVC-Post: 1.88 L
FVC-Pre: 2.02 L
POST FEV1/FVC RATIO: 74 %
PRE FEV6/FVC RATIO: 98 %
Post FEV6/FVC ratio: 100 %
Pre FEV1/FVC ratio: 66 %
RV % PRED: 75 %
RV: 2 L
TLC % pred: 67 %
TLC: 4.03 L

## 2016-02-03 MED ORDER — ALBUTEROL SULFATE (2.5 MG/3ML) 0.083% IN NEBU
2.5000 mg | INHALATION_SOLUTION | Freq: Once | RESPIRATORY_TRACT | Status: AC
  Administered 2016-02-03: 2.5 mg via RESPIRATORY_TRACT

## 2016-02-03 MED ORDER — IOPAMIDOL (ISOVUE-370) INJECTION 76%
75.0000 mL | Freq: Once | INTRAVENOUS | Status: AC | PRN
Start: 2016-02-03 — End: 2016-02-03
  Administered 2016-02-03: 75 mL via INTRAVENOUS

## 2016-02-04 ENCOUNTER — Ambulatory Visit: Payer: Medicare Other | Attending: Internal Medicine | Admitting: Physical Therapy

## 2016-02-04 ENCOUNTER — Encounter: Payer: Self-pay | Admitting: Physical Therapy

## 2016-02-04 ENCOUNTER — Ambulatory Visit: Payer: Self-pay | Admitting: Physical Therapy

## 2016-02-04 DIAGNOSIS — R2689 Other abnormalities of gait and mobility: Secondary | ICD-10-CM | POA: Diagnosis not present

## 2016-02-04 DIAGNOSIS — M6281 Muscle weakness (generalized): Secondary | ICD-10-CM | POA: Diagnosis not present

## 2016-02-04 DIAGNOSIS — R293 Abnormal posture: Secondary | ICD-10-CM | POA: Insufficient documentation

## 2016-02-04 NOTE — Therapy (Signed)
[place], [place], [address] Phone: [phone]   Fax:  [phone]  Physical Therapy Evaluation  Patient Details  Name: TAQUANDA ARNAUD MRN: [id number] Date of Birth: [birth year] Referring Provider: Dr. Darylene Price  Encounter Date: 02/04/2016      PT End of Session - 02/04/16 1050    Visit Number 1   PT Start Time 1050   PT Stop Time 1135   PT Time Calculation (min) 45 min   Equipment Utilized During Treatment Gait belt      Past Medical History  Diagnosis Date  . Hyperlipidemia   . Hypertension   . Back pain, chronic   . Lupus ([place])     Treated with prednisone indefinitely   . Seasonal allergies   . Mitral regurgitation   . Atrial fibrillation ([place])     not on coumidin because AIHA and major bleeding complications  . Axillary lymphadenopathy     left negative MMG 1/08  . AIHA (autoimmune hemolytic anemia) ([place])     12/08 onset  . Diabetes mellitus   . Complicated grief [id number]    Death of husband in 01-23-11   . Mitral regurgitation 10/24/2006    2d Echo from 2008/01/23 - EF 60%, Moderated MR      Past Surgical History  Procedure Laterality Date  . Eye surgery      cataract in both eyes  . Tee without cardioversion N/A 12/09/2015    Procedure: TRANSESOPHAGEAL ECHOCARDIOGRAM (TEE);  Surgeon: [name], MD;  Location: [facility] ENDOSCOPY;  Service: Cardiovascular;  Laterality: N/A;  . Cardiac catheterization N/A 01/07/2016    Procedure: Right/Left Heart Cath and Coronary Angiography;  Surgeon: [name], MD;  Location: [facility] LAB;  Service: Cardiovascular;  Laterality: N/A;    There were no vitals filed for this visit.       Subjective Assessment - 02/04/16 1054    Subjective Pt reports she doesn't notice SOB but daugter says she has it per patient. Pt still driving and completing housework and doesn't notice any difference compared to a year ago. Pt denies chest pain, dizziness or  fatigue.    Patient Stated Goals complete evaluation as part of surgical work-up   Currently in Pain? Yes  knee and wrist joints due to lupus, 0/10 at time of eval            [facility] PT Assessment - 02/04/16 0001    Assessment   Medical Diagnosis Severe mitral regurgitationtral regurgitation   Referring Provider Dr. Darylene Price   Onset Date/Surgical Date 01/18/16  surgical consult   Precautions   Precautions None   Restrictions   Weight Bearing Restrictions No   Balance Screen   Has the patient fallen in the past 6 months No   Has the patient had a decrease in activity level because of a fear of falling?  No   Is the patient reluctant to leave their home because of a fear of falling?  No   Home [employer] residence   Living Arrangements Other (Comment)  grandson and friend   Home Access Level entry   Home Layout One level   [place] - 4 wheels;Kasandra Knudsen - single point   Prior Function   Level of Independence Independent with household mobility without device;Independent with community mobility with device  over last year   Observation/Other Assessments-Edema    Edema --  RLE > LLE at ankle  Posture/Postural Control   Posture/Postural Control Postural limitations   Postural Limitations Rounded Shoulders;Forward head  L thoracic C curve   ROM / Strength   AROM / PROM / Strength AROM;Strength   AROM   Overall AROM Comments grossly WNL   Strength   Overall Strength Comments grossly 4/5 throughout   Strength Assessment Site Hand   Right/Left hand Right;Left   Right Hand Grip (lbs) 30  R handful dominant   Left Hand Grip (lbs) 30   Ambulation/Gait   Assistive device 4-wheeled walker   Gait Pattern Poor foot clearance - left;Poor foot clearance - right;Trunk flexed  bil genu valgus          [facility] Pre-Surgical Assessment - 02/04/16 0001    5 Meter Walk Test- trial 1 6.5 sec   5 Meter Walk Test- trial 2 8 sec.    5 Meter  Walk Test- trial 3 7.4 sec.  >6 sec considered slow speed   5 meter walk test average 7.3 sec   Timed Up & Go Test trial  22.5 sec.  with 4 wheeled RW   Comments >/= 12 sec indicates increased fall risk   4 Stage Balance Test tolerated for:  0 sec.   4 Stage Balance Test Position 1   comment indicates increased fall risk   Comment unable without UE support   ADL/IADL Independent with: Bathing;Dressing;Meal prep;Finances   ADL/IADL Needs Assistance with: Valla Leaver work   ADL/IADL Fraility Index Vulnerable   6 Minute Walk- Baseline yes   BP (mmHg) 134/80 mmHg   HR (bpm) 66   02 Sat (%RA) 98 %   Modified Borg Scale for Dyspnea 0- Nothing at all   Perceived Rate of Exertion (Borg) 6-   6 Minute Walk Post Test yes   BP (mmHg) 150/82 mmHg   HR (bpm) 98   02 Sat (%RA) 89 %   Modified Borg Scale for Dyspnea 2- Mild shortness of breath   Perceived Rate of Exertion (Borg) 9- very light   Aerobic Endurance Distance Walked 370  with 4 wheeled RW   Endurance additional comments Pt required seated rest break at 1:45 after ambulated 185' and appeared mildly SOB however pt self reported no SOB at that time. Pt rested 1:30 minutes and then resumed for another 1:30 before needing another seated rest break. At that time, O2 was 89% and pt self reported 2/10 SOB on Modified Borg Scale for Dyspnea.                          PT Education - 02/04/16 1205    Education provided Yes   Education Details fall risk/use of RW   Person(s) Educated Patient   Methods Explanation   Comprehension Verbalized understanding                    Plan - 02/04/16 1206    Clinical Impression Statement Pt is a 75 yo female presenting for PT eval prior to possible AVR surgery due to severe mitral regurgitation. Pt reports she doesn't notice SOB but daughter says she has it per patient. Pt still driving and completing housework and doesn't notice any difference compared to a year ago. Pt denies  chest pain, dizziness or fatigue. Pt presents with ROM WNL throughout, mild weakness with 4/5 muscle strength, fair balance with increased fall risk per 4 stage balance test and timed up and go. Pt has slow walking speed and poor aerobic  enurance per 6 minute walk test. Pt required seated rest break at 1:45 after ambulated 185' and appeared mildly SOB however pt self reported no SOB at that time. Pt rested 1:30 minutes and then resumed for another 1:30 before needing another seated rest break. At that time, O2 was 89% and pt self reported 2/10 SOB on Modified Borg Scale for Dyspnea.    PT Frequency One time visit   Consulted and Agree with Plan of Care Patient      Patient was found to have the following deficits and impairments:     Visit Diagnosis: Other abnormalities of gait and mobility - Plan: PT plan of care cert/re-cert  Muscle weakness (generalized) - Plan: PT plan of care cert/re-cert  Abnormal posture - Plan: PT plan of care cert/re-cert      G-Codes - [id number] Jan 22, 1210    Functional Assessment Tool Used 6 minute walk 370' with 4 wheeled RW and 2 rest breaks   Functional Limitation Mobility: Walking and moving around   Mobility: Walking and Moving Around Current Status [phone]) At least 60 percent but less than 80 percent impaired, limited or restricted   Mobility: Walking and Moving Around Goal Status [phone]) At least 60 percent but less than 80 percent impaired, limited or restricted   Mobility: Walking and Moving Around Discharge Status [phone]) At least 60 percent but less than 80 percent impaired, limited or restricted       Problem List Patient Active Problem List   Diagnosis Date Noted  . Allergic rhinitis 05/07/2015  . Pulmonary hypertension ([place]) 10/09/2014  . Mitral regurgitation 10/09/2014  . Dyspnea on exertion 06/04/2014  . Osteoporosis 05/29/2013  . Insomnia 05/29/2013  . Preventative health care 02/13/2013  . Abnormality of gait 01/08/2013  . Vitamin D  deficiency 06/30/2011  . LESION, CERVIX 01/29/2009  . Diabetes ([place]) 02/27/2008  . ANEMIA, HEMOLYTIC, AUTOIMMUNE 10/12/2007  . DEPENDENT EDEMA, LEGS 01/04/2007  . Atrial fibrillation ([place]) 10/24/2006  . HYPERLIPIDEMIA 08/02/2006  . Essential hypertension 08/02/2006  . LUPUS 08/02/2006  . LOW BACK PAIN 08/02/2006    NICOLETTA,DANA, PT 02/04/2016, 12:12 PM  [facility] [address] [place], [place], [address] Phone: [phone]   Fax:  [phone]  Name: ADEA TRUPIA MRN: [id number] Date of Birth: Feb 11, 1941

## 2016-02-06 ENCOUNTER — Other Ambulatory Visit: Payer: Self-pay | Admitting: Internal Medicine

## 2016-02-08 ENCOUNTER — Encounter: Payer: Self-pay | Admitting: Thoracic Surgery (Cardiothoracic Vascular Surgery)

## 2016-02-08 ENCOUNTER — Ambulatory Visit (INDEPENDENT_AMBULATORY_CARE_PROVIDER_SITE_OTHER): Payer: Medicare Other | Admitting: Thoracic Surgery (Cardiothoracic Vascular Surgery)

## 2016-02-08 VITALS — BP 150/90 | HR 80 | Resp 20 | Ht 70.0 in | Wt 180.0 lb

## 2016-02-08 DIAGNOSIS — I340 Nonrheumatic mitral (valve) insufficiency: Secondary | ICD-10-CM | POA: Diagnosis not present

## 2016-02-08 DIAGNOSIS — I482 Chronic atrial fibrillation, unspecified: Secondary | ICD-10-CM

## 2016-02-08 DIAGNOSIS — I5022 Chronic systolic (congestive) heart failure: Secondary | ICD-10-CM

## 2016-02-08 NOTE — Telephone Encounter (Signed)
Last appointment 05/06/2015. No future appointments.

## 2016-02-08 NOTE — Progress Notes (Signed)
[address]       [place] [address]             [place] OFFICE NOTE  Referring Provider is [name], MD PCP is Duwaine Maxin, DO   HPI:  Patient returns to the office today for follow-up of stage D severe symptomatic primary mitral regurgitation. She was originally seen in consultation on 01/18/2016. Since then she has undergone CT angiography, pulmonary function testing, and a comprehensive physical therapy evaluation. She returns to the office today to review the results of these tests and discuss treatment options further. She reports no new problems or complaints over the last few weeks.   Current Outpatient Prescriptions  Medication Sig Dispense Refill  . apixaban (ELIQUIS) 5 MG TABS tablet Take 1 tablet (5 mg total) by mouth 2 (two) times daily. 60 tablet 6  . Calcium Carbonate-Vitamin D (CALTRATE 600+D) 600-400 MG-UNIT per tablet Take 1 tablet by mouth 3 (three) times daily with meals. 90 tablet 5  . diclofenac sodium (VOLTAREN) 1 % GEL Apply 1 application topically 2 (two) times daily as needed. For leg, left hip and foot pain 1 Tube 5  . fluticasone (FLONASE) 50 MCG/ACT nasal spray Place 2 sprays into both nostrils daily. 16 g 1  . furosemide (LASIX) 40 MG tablet TAKE 1 TABLET (40 MG TOTAL) BY MOUTH DAILY. 90 tablet 1  . glucose blood (ONE TOUCH ULTRA TEST) test strip Use to check blood sugar 1 to 2 times daily. diag code E11.9. Non insulin dependent 100 each 3  . levocetirizine (XYZAL) 5 MG tablet Take 0.5 tablets (2.5 mg total) by mouth every evening. 30 tablet 1  . losartan (COZAAR) 100 MG tablet TAKE 1 TABLET BY MOUTH EVERY DAY 90 tablet 1  . metoprolol (LOPRESSOR) 50 MG tablet TAKE 1 TABLET (50 MG TOTAL) BY MOUTH 2 (TWO) TIMES DAILY. 180 tablet 1  . nitroGLYCERIN (NITROSTAT) 0.4 MG SL tablet Place 0.4 mg under the tongue every 5 (five) minutes as needed. Reported on 09/30/2015    . ONETOUCH DELICA LANCETS FINE  MISC 1 each by Does not apply route daily. 100 each 3  . Polyethyl Glycol-Propyl Glycol (SYSTANE OP) Place 1 drop into both eyes daily.    . pravastatin (PRAVACHOL) 40 MG tablet TAKE 1 TABLET (40 MG TOTAL) BY MOUTH DAILY. 30 tablet 3  . predniSONE (DELTASONE) 10 MG tablet TAKE 1 TABLET (10 MG TOTAL) BY MOUTH DAILY WITH BREAKFAST. 90 tablet 1  . traZODone (DESYREL) 50 MG tablet TAKE 1 TABLET (50 MG TOTAL) BY MOUTH AT BEDTIME AS NEEDED FOR SLEEP. 90 tablet 1  . [DISCONTINUED] Calcium Carbonate-Vitamin D (CALTRATE 600+D PO) Take 600 mg by mouth daily.     No current facility-administered medications for this visit.      Physical Exam:   BP 150/90 mmHg  Pulse 80  Resp 20  Ht 5\' 10"  (1.778 m)  Wt 180 lb (81.647 kg)  BMI 25.83 kg/m2  SpO2 96%  General:  Obese, somewhat debilitated, but otherwise well-appearing  Chest:   Clear to auscultation with slightly diminished breath sounds both lung bases  CV:   Irregular rate and rhythm with soft holosystolic murmur  Incisions:  n/a  Abdomen:  Soft nontender  Extremities:  Warm and well perfused with mild lower extremity edema   Diagnostic Tests:  CT ANGIOGRAPHY CHEST, ABDOMEN AND PELVIS  TECHNIQUE: Multidetector CT imaging through the chest, abdomen and  pelvis was performed using the standard protocol during bolus administration of intravenous contrast. Multiplanar reconstructed images and MIPs were obtained and reviewed to evaluate the vascular anatomy.  CONTRAST: 75 mL Isovue 370.  COMPARISON: 10/04/2007, 03/27/2012  FINDINGS: CTA CHEST FINDINGS  The lungs are again well aerated with significant emphysematous changes. These have progressed in the interval from 2013. In the right upper lobe best seen on image number 27 of series 5 there is a 6 mm nodule again identified and stable in appearance. A posterior subpleural nodule is seen on image number 33 of series 5 and relatively stable from the prior exam. Some nodular  density is noted along the major fissure which has progressed in the interval. Its maximum dimension is 6 mm. There are also new nodular densities along the major fissure inferiorly best seen on image number 56 of series 5. The largest of these measures 13 mm in greatest dimension. A few smaller adjacent nodules are seen. No significant nodularity is noted on the left. No focal infiltrate is seen. Bibasilar scarring is again noted.  The thoracic inlet shows enlargement of the right lobe of the thyroid with intrathoracic extension. The overall appearance is stable from the prior exam. Some left thyroid nodules are also noted which are stable from the prior study. The thoracic aorta shows no aneurysmal dilatation. Calcific atherosclerotic plaque is seen. No dissection is identified. Pulmonary artery as visualized is enlarged consistent with the given clinical history of pulmonary arterial hypertension. No evidence of pulmonary embolism is noted. There is enlargement of the left atrium and left atrial appendage. No thrombus is noted within the left atrium. The right atrium appears enlarged as well.  Some small hilar lymph nodes are seen although these are not significant by size criteria. No significant mediastinal adenopathy is noted. The osseous structures show degenerative change of the thoracic spine as well as a scoliosis concave to the left  No compression deformities are seen.  Review of the MIP images confirms the above findings.  CTA ABDOMEN AND PELVIS FINDINGS  The liver, gallbladder, spleen, adrenal glands and pancreas are within normal limits. The kidneys demonstrate a normal enhancement bilaterally. No renal calculi or obstructive changes are seen. The bladder is partially distended.  The uterus and ovaries are within normal limits. The appendix is within normal limits. Scattered mild diverticular change of the colon is seen without diverticulitis. No free  pelvic fluid is noted. There are changes in the right groin likely related to the recent cardiac catheterization and small hematoma. It measures approximately 3.7 by 3.0 cm. No pseudoaneurysm is identified.  The osseous structures show degenerative change as well as the scoliosis concave to the left.  Vascular: The abdominal aorta demonstrates mild atherosclerotic plaque as well tortuosity related to the underlying scoliosis. No aneurysmal dilatation is seen. Aortic bifurcation is patent. The iliac arteries are patent as well with some calcific plaque although no focal hemodynamically significant stenosis is noted. Right common iliac artery measures just under 10 mm. The left common iliac artery measures 10 mm as well.  Review of the MIP images confirms the above findings.  IMPRESSION: Left and right atrial enlargement with enlargement and left atrial appendage. This is consistent with the given clinical history. Changes of pulmonary arterial hypertension are also noted.  No thoracic aortic aneurysm is noted. No pulmonary emboli are noted.  The emphysematous changes with some stable nodular changes on the right although there are new nodular changes along the major fissure. The largest  of these measures 1.3 cm. Some associated smaller nodules are identified varying in size from 5 to 9 mm. Neoplasm deserves consideration and further workup is recommended. PET-CT may be helpful in this regard.  Atherosclerotic changes in the abdominal aorta and iliac vessels without significant stenosis.  Post catheterization hematoma in the right inguinal region.   Electronically Signed  By: Inez Catalina M.D.  On: 02/03/2016 10:45    Pulmonary Function Tests  Baseline      Post-bronchodilator  FVC  2.02 L  (71% predicted) FVC  1.88 L  (66% predicted) FEV1  1.34 L  (60% predicted) FEV1  1.40 L  (63% predicted) FEF25-75 0.69 L  (36% predicted) FEF25-75 0.77 L  (40%  predicted)  TLC  4.03 L  (67% predicted) RV  2.00 L  (75% predicted) DLCO  24% predicted    Physical Therapy Evaluation  Clinical Impression Statement:  Pt is a 75 yo female presenting for PT eval prior to possible AVR surgery due to severe mitral regurgitation. Pt reports she doesn't notice SOB but daughter says she has it per patient. Pt still driving and completing housework and doesn't notice any difference compared to a year ago. Pt denies chest pain, dizziness or fatigue. Pt presents with ROM WNL throughout, mild weakness with 4/5 muscle strength, fair balance with increased fall risk per 4 stage balance test and timed up and go. Pt has slow walking speed and poor aerobic enurance per 6 minute walk test. Pt required seated rest break at 1:45 after ambulated 185' and appeared mildly SOB however pt self reported no SOB at that time. Pt rested 1:30 minutes and then resumed for another 1:30 before needing another seated rest break. At that time, O2 was 89% and pt self reported 2/10 SOB on Modified Borg Scale for Dyspnea.   Additional Comments:  Pt required seated rest break at 1:45 after ambulated 185' and appeared mildly SOB however pt self reported no SOB at that time. Pt rested 1:30 minutes and then resumed for another 1:30 before needing another seated rest break. At that time, O2 was 89% and pt self reported 2/10 SOB on Modified Borg Scale for Dyspnea.    Impression:  Patient has stage D severe symptomatic primary mitral regurgitation, permanent atrial fibrillation, moderate pulmonary hypertension, and mild to moderate tricuspid regurgitation. She has been followed for many years with slowly progressing symptoms of exertional shortness of breath and fatigue consistent with chronic diastolic congestive heart failure. The patient tends to minimize her symptoms, she was quite limited by dyspnea and fatigue during her recent formal physical therapy evaluation.  Pulmonary function tests  document severely decreased oxygen diffusion capacity.  Her functional status and mobility are certainly affected by problems with chronic pain, severe kyphoscoliosis of the spine, lupus, and rheumatoid arthritis, chronic immunosuppresion, and generalized physical deconditioning.  Depression may be playing a role, as the patient herself feels that her functional decline was precipitated by her son's death last summer.  Risks associated with conventional surgical mitral valve repair or replacement with or without concomitant Maze procedure would unquestionably be high for all of these reasons.    Plan:  I have again reviewed treatment options at length with the patient and her family in the office today. Implications regarding results of her recent pulmonary function tests and physical therapy evaluation have been discussed.  The patient and her family have significant reservations regarding whether or not she might tolerate high-risk conventional surgery and they are interested in exploring  alternatives options. Under the circumstances, it might be reasonable to consider percutaneous edge to edge mitral valve repair using a MitraClip, although functional anatomy may or may not be favorable.  The patient hopes to discuss this matter further with Dr. Burt Knack and consider possible referral to a tertiary care center where the MitraClip procedure is routinely performed.     I spent in excess of 15 minutes during the conduct of this office consultation and >50% of this time involved direct face-to-face encounter with the patient for counseling and/or coordination of their care.    Valentina Gu. Roxy Manns, MD 02/08/2016 3:25 PM

## 2016-02-16 ENCOUNTER — Telehealth: Payer: Self-pay | Admitting: Cardiovascular Disease

## 2016-02-16 NOTE — Telephone Encounter (Signed)
Pt's dtr [name] sent off discount card for Eliquis and in the meantime wants to request samples

## 2016-02-16 NOTE — Telephone Encounter (Signed)
I spoke with the pt's daughter and made her aware that I do have a 4 week supply of Eliquis samples available.  I will place these at the front desk for pick up later today. [name] was very Patent attorney.

## 2016-03-03 ENCOUNTER — Encounter: Payer: Self-pay | Admitting: Internal Medicine

## 2016-03-03 ENCOUNTER — Ambulatory Visit: Payer: Self-pay | Admitting: Pharmacist

## 2016-03-03 ENCOUNTER — Ambulatory Visit (INDEPENDENT_AMBULATORY_CARE_PROVIDER_SITE_OTHER): Payer: Medicare Other | Admitting: Internal Medicine

## 2016-03-03 VITALS — BP 142/72 | HR 61 | Temp 98.2°F | Ht 70.0 in | Wt 186.6 lb

## 2016-03-03 DIAGNOSIS — I340 Nonrheumatic mitral (valve) insufficiency: Secondary | ICD-10-CM | POA: Diagnosis not present

## 2016-03-03 DIAGNOSIS — E118 Type 2 diabetes mellitus with unspecified complications: Secondary | ICD-10-CM

## 2016-03-03 DIAGNOSIS — I10 Essential (primary) hypertension: Secondary | ICD-10-CM

## 2016-03-03 DIAGNOSIS — M810 Age-related osteoporosis without current pathological fracture: Secondary | ICD-10-CM

## 2016-03-03 DIAGNOSIS — I4821 Permanent atrial fibrillation: Secondary | ICD-10-CM

## 2016-03-03 DIAGNOSIS — Z7952 Long term (current) use of systemic steroids: Secondary | ICD-10-CM

## 2016-03-03 DIAGNOSIS — I4891 Unspecified atrial fibrillation: Secondary | ICD-10-CM

## 2016-03-03 DIAGNOSIS — E119 Type 2 diabetes mellitus without complications: Secondary | ICD-10-CM | POA: Diagnosis not present

## 2016-03-03 DIAGNOSIS — Z7901 Long term (current) use of anticoagulants: Secondary | ICD-10-CM

## 2016-03-03 DIAGNOSIS — M329 Systemic lupus erythematosus, unspecified: Secondary | ICD-10-CM

## 2016-03-03 DIAGNOSIS — Z79899 Other long term (current) drug therapy: Secondary | ICD-10-CM

## 2016-03-03 LAB — POCT GLYCOSYLATED HEMOGLOBIN (HGB A1C): HEMOGLOBIN A1C: 6.4

## 2016-03-03 LAB — GLUCOSE, CAPILLARY: GLUCOSE-CAPILLARY: 124 mg/dL — AB (ref 65–99)

## 2016-03-03 MED ORDER — METOPROLOL TARTRATE 50 MG PO TABS: ORAL_TABLET | ORAL | Status: DC

## 2016-03-03 MED ORDER — LOSARTAN POTASSIUM 100 MG PO TABS: 100.0000 mg | ORAL_TABLET | Freq: Every day | ORAL | Status: DC

## 2016-03-03 MED ORDER — FUROSEMIDE 40 MG PO TABS: ORAL_TABLET | ORAL | Status: DC

## 2016-03-03 MED ORDER — TRAZODONE HCL 50 MG PO TABS: ORAL_TABLET | ORAL | Status: DC

## 2016-03-03 NOTE — Progress Notes (Signed)
Subjective:    Patient ID: Stephanie Horne, female    DOB: 1941-04-07, 75 y.o.   MRN: [id number]  HPI Comments: Stephanie Horne is a 75 year old woman with PMH as below here for follow-up of HTN.  Please see problem based charting for status of this and other chronic conditions.     Past Medical History  Diagnosis Date  . Hyperlipidemia   . Hypertension   . Back pain, chronic   . Lupus ([place])     Treated with prednisone indefinitely   . Seasonal allergies   . Mitral regurgitation   . Atrial fibrillation ([place])     not on coumidin because AIHA and major bleeding complications  . Axillary lymphadenopathy     left negative MMG 1/08  . AIHA (autoimmune hemolytic anemia) ([place])     12/08 onset  . Diabetes mellitus   . Complicated grief [id number]    Death of husband in 11-Jan-2011   . Mitral regurgitation 10/24/2006    2d Echo from 01/11/2008 - EF 60%, Moderated MR     Current Outpatient Prescriptions on File Prior to Visit  Medication Sig Dispense Refill  . apixaban (ELIQUIS) 5 MG TABS tablet Take 1 tablet (5 mg total) by mouth 2 (two) times daily. 60 tablet 6  . Calcium Carbonate-Vitamin D (CALTRATE 600+D) 600-400 MG-UNIT per tablet Take 1 tablet by mouth 3 (three) times daily with meals. 90 tablet 5  . diclofenac sodium (VOLTAREN) 1 % GEL Apply 1 application topically 2 (two) times daily as needed. For leg, left hip and foot pain 1 Tube 5  . fluticasone (FLONASE) 50 MCG/ACT nasal spray Place 2 sprays into both nostrils daily. 16 g 1  . furosemide (LASIX) 40 MG tablet TAKE 1 TABLET (40 MG TOTAL) BY MOUTH DAILY. 90 tablet 1  . glucose blood (ONE TOUCH ULTRA TEST) test strip Use to check blood sugar 1 to 2 times daily. diag code E11.9. Non insulin dependent 100 each 3  . levocetirizine (XYZAL) 5 MG tablet TAKE 0.5 TABLETS (2.5 MG TOTAL) BY MOUTH EVERY EVENING. 30 tablet 1  . losartan (COZAAR) 100 MG tablet TAKE 1 TABLET BY MOUTH EVERY DAY 90 tablet 1  . metoprolol (LOPRESSOR) 50 MG tablet TAKE 1 TABLET  (50 MG TOTAL) BY MOUTH 2 (TWO) TIMES DAILY. 180 tablet 1  . nitroGLYCERIN (NITROSTAT) 0.4 MG SL tablet Place 0.4 mg under the tongue every 5 (five) minutes as needed. Reported on 09/30/2015    . ONETOUCH DELICA LANCETS FINE MISC 1 each by Does not apply route daily. 100 each 3  . Polyethyl Glycol-Propyl Glycol (SYSTANE OP) Place 1 drop into both eyes daily.    . pravastatin (PRAVACHOL) 40 MG tablet TAKE 1 TABLET (40 MG TOTAL) BY MOUTH DAILY. 30 tablet 3  . predniSONE (DELTASONE) 10 MG tablet TAKE 1 TABLET (10 MG TOTAL) BY MOUTH DAILY WITH BREAKFAST. 90 tablet 1  . traZODone (DESYREL) 50 MG tablet TAKE 1 TABLET (50 MG TOTAL) BY MOUTH AT BEDTIME AS NEEDED FOR SLEEP. 90 tablet 1  . [DISCONTINUED] Calcium Carbonate-Vitamin D (CALTRATE 600+D PO) Take 600 mg by mouth daily.     No current facility-administered medications on file prior to visit.    Review of Systems     Filed Vitals:   03/03/16 0948  BP: 142/72  Pulse: 61  Temp: 98.2 F (36.8 C)  TempSrc: Oral  Height: 5\' 10"  (1.778 m)  Weight: 186 lb 9.6 oz (84.641 kg)  SpO2: 98%  Objective:   Physical Exam  Constitutional: She is oriented to person, place, and time. She appears well-developed. No distress.  HENT:  Head: Normocephalic and atraumatic.  Mouth/Throat: Oropharynx is clear and moist. No oropharyngeal exudate.  Eyes: Conjunctivae and EOM are normal. Pupils are equal, round, and reactive to light. No scleral icterus.  Neck: Neck supple.  Cardiovascular: Normal rate.  Exam reveals no gallop and no friction rub.   Murmur heard. 3/6 systolic murmur Irregular rhythm  Pulmonary/Chest: Effort normal and breath sounds normal. No respiratory distress. She has no wheezes. She has no rales.  Abdominal: Soft. Bowel sounds are normal. She exhibits no distension. There is no tenderness. There is no rebound and no guarding.  Musculoskeletal: She exhibits edema. She exhibits no tenderness.  1+ B/L LE edema  Neurological: She is  alert and oriented to person, place, and time. No cranial nerve deficit.  Skin: Skin is warm. She is not diaphoretic.  Psychiatric: She has a normal mood and affect. Her behavior is normal. Judgment and thought content normal.  Vitals reviewed.         Assessment & Plan:  Please see problem based charting for A&P.

## 2016-03-03 NOTE — Assessment & Plan Note (Signed)
BP Readings from Last 3 Encounters:  03/03/16 142/72  02/08/16 150/90  01/18/16 120/73    Lab Results  Component Value Date   NA 145 01/07/2016   K 4.0 01/07/2016   CREATININE 0.72 01/07/2016    Assessment: Blood pressure control:  well controlled Progress toward BP goal:   near goal Comments: Compliant with losartan 100mg  daily, metoprolol 50mg BID.  She admits to sometimes skipping Lasix 40mg  daily on days she will be away from home because she has to run to the bathroom.  Plan: Medications:  continue current medications:  losartan 100mg  daily, metoprolol 50mg BID, lasix 40mg  daily (advised that she take this early in AM prior to going out). Educational resources provided: brochure (denies need) Self management tools provided:   Other plans: RTC in 3-4 months for follow-up.

## 2016-03-03 NOTE — Assessment & Plan Note (Addendum)
Assessment:  Symptoms stable on pred 10mg  daily.  She has not yet been back to rheum. She does report that the specialist co-pay visits are high so this may be an issue.  She has been busy seeing specialist for mitral regurg. Plan:  Will hold off on re-referral to rheum until plans are finalized with cardiology and/or surgeons.  She will continue Pred for now.  She knows this med cannot be stopped abruptly.

## 2016-03-03 NOTE — Progress Notes (Signed)
Medication Samples have been provided to the patient.  Drug: Eliquis (apixapan) Strength: 5 mg Qty: 28 [id number] Exp.Date: 5/19 Dosing instructions: 5 mg BID  Also completed clinical review and no concerns identified.  The patient has been instructed regarding the correct time, dose, and frequency of taking this medication, including desired effects and most common side effects.   Kim,Jennifer J 11:26 AM 03/03/2016

## 2016-03-03 NOTE — Assessment & Plan Note (Signed)
Assessment:  Compliant with vit D and Calcium.  Did not go for DEXA yet. Plan:  Re-refer for DEXA. With chronic pred use, she will likely benefit from resuming bisphosphonate.

## 2016-03-03 NOTE — Assessment & Plan Note (Addendum)
Lab Results  Component Value Date   HGBA1C 6.4 03/03/2016   HGBA1C 7.0 09/30/2015   HGBA1C 7.0 05/06/2015     Assessment: Diabetes control:  well controlled Progress toward A1C goal:   at goal Comments: Diet controlled.  Plan: Medications:  Diet controlled.  Also on ARB and statin. Home glucose monitoring:  Patient likes to check. Frequency:   Timing:   Instruction/counseling given: reminded to bring medications to each visit Educational resources provided: brochure (denioes need ) Self management tools provided: copy of home glucose meter download Other plans: Follow-up in 6 months.

## 2016-03-03 NOTE — Assessment & Plan Note (Signed)
Assessment:  Rate controlled.  Asymptomatic. Denies bleeding on Eliquis.  Patient only has 1.5 weeks left of Eliquis samples provided by cardiology office.  She cannot afford $40 monthly co-pay with rebate card.  Plan:  Continue metoprolol 50mg  BID and Eliquis BID.  She was provided with an additional 2 weeks of Eliquis samples from our clinic.  Working with Dr. Maudie Mercury (PharmD) to find a more affordable option or cheaper rebate program.

## 2016-03-03 NOTE — Progress Notes (Signed)
Physician pharmacist co-visit 

## 2016-03-03 NOTE — Assessment & Plan Note (Signed)
Assessment:  Severe MR.  Patient without complaint but daughter says she gets dyspneic with exertion. She has been evaluated by CTS but patient not interested in open heart procedure and would likely be high surgical risk.  She has been referred to [facility] center for [facility] evaluation and is waiting to find out if they will see her. Plan:  Continue current therapy.  Follow-up with Cardiology on 06/13 as previously scheduled.

## 2016-03-03 NOTE — Patient Instructions (Signed)
1.  I am referring your for DEXA scan.  I know you will be busy with follow-up with cardiologist and surgeons regarding repair of your mitral valve.  Please follow-up with our office in 3-4 months.     2. Please take all medications as prescribed.  It may help to take your Lasix during a part of the day when you will not be leaving home.    3. If you have worsening of your symptoms or new symptoms arise, please call the clinic [phone]), or go to the ER immediately if symptoms are severe.

## 2016-03-04 NOTE — Progress Notes (Signed)
Internal Medicine Clinic Attending  Case discussed with Dr. Wilson soon after the resident saw the patient.  We reviewed the resident's history and exam and pertinent patient test results.  I agree with the assessment, diagnosis, and plan of care documented in the resident's note.  

## 2016-03-07 ENCOUNTER — Ambulatory Visit: Payer: Self-pay | Admitting: Pharmacist

## 2016-03-08 ENCOUNTER — Other Ambulatory Visit: Payer: Self-pay | Admitting: Internal Medicine

## 2016-03-08 DIAGNOSIS — M810 Age-related osteoporosis without current pathological fracture: Secondary | ICD-10-CM

## 2016-03-09 ENCOUNTER — Ambulatory Visit (INDEPENDENT_AMBULATORY_CARE_PROVIDER_SITE_OTHER): Payer: Medicare Other | Admitting: Pharmacist

## 2016-03-09 ENCOUNTER — Encounter: Payer: Self-pay | Admitting: Pharmacist

## 2016-03-09 DIAGNOSIS — Z7189 Other specified counseling: Secondary | ICD-10-CM

## 2016-03-09 DIAGNOSIS — Z7901 Long term (current) use of anticoagulants: Secondary | ICD-10-CM | POA: Diagnosis not present

## 2016-03-09 DIAGNOSIS — I4891 Unspecified atrial fibrillation: Secondary | ICD-10-CM

## 2016-03-09 DIAGNOSIS — Z79899 Other long term (current) drug therapy: Secondary | ICD-10-CM

## 2016-03-09 NOTE — Progress Notes (Signed)
Stephanie Horne is a 75 y.o. female who reports to the clinic for monitoring of apixaban (Eliquis) therapy.    ASSESSMENT Indication(s): atrial fibrillation Duration: indefinite  Labs:    Component Value Date/Time   AST 19 06/04/2014 1418   ALT 14 06/04/2014 1418   NA 145 01/07/2016 0909   K 4.0 01/07/2016 0909   CL 108 01/07/2016 0909   CO2 24 01/07/2016 0909   GLUCOSE 113* 01/07/2016 0909   HGBA1C 6.4 03/03/2016 1010   HGBA1C 6.6 11/24/2009 1456   BUN 14 01/07/2016 0909   CREATININE 0.72 01/07/2016 0909   CREATININE 0.89 12/04/2015 1020   CALCIUM 9.1 01/07/2016 0909   GFRAA >60 01/07/2016 0909   GFRAA >89 01/15/2015 1222   WBC 4.1 01/07/2016 0909   WBC 5.4 10/07/2008 1419   HGB 13.7 01/07/2016 0909   HGB 14.4 10/07/2008 1419   HCT 42.0 01/07/2016 0909   HCT 43.3 10/07/2008 1419   PLT 119* 01/07/2016 0909   PLT 150 10/07/2008 1419   apixaban (Eliquis) Dose: 5 mg BID  Patient presented for help with apixaban. Patient reports no signs/symptoms of bleeding or thromboembolism. She states she needs help affording the medication. Patient referred to Eliquis patient assistance program and was advised to contact me if further assistance needed. Patient verbalized understanding of information.  Follow-up Patient has an appointment scheduled with cardiology 03/15/16.   [facility] Pharmacist 03/09/2016, 10:20 AM  15 minutes spent face-to-face with the patient during the encounter. 75% of time spent on education. 25% of time was spent on assessment and plan

## 2016-03-09 NOTE — Patient Instructions (Signed)
Patient educated about medication as defined in this encounter and verbalized understanding by repeating back instructions provided.   

## 2016-03-10 ENCOUNTER — Telehealth: Payer: Self-pay | Admitting: *Deleted

## 2016-03-10 NOTE — Telephone Encounter (Signed)
PATIENT CONTACTED WITH DEXA SCAN APPOINTMENT/ [facility] / June 26.017 @ 10:00AM / STOP TAKING CALCIUM 2 DAYS PRIOR TO EXAM / BRING INSURANCE AND PICTURE ID.  APPOINTMENT MAILED TO PATIENT [place].

## 2016-03-14 ENCOUNTER — Other Ambulatory Visit: Payer: Self-pay | Admitting: Internal Medicine

## 2016-03-15 ENCOUNTER — Ambulatory Visit (INDEPENDENT_AMBULATORY_CARE_PROVIDER_SITE_OTHER): Payer: Medicare Other | Admitting: Cardiovascular Disease

## 2016-03-15 ENCOUNTER — Encounter: Payer: Self-pay | Admitting: Cardiovascular Disease

## 2016-03-15 VITALS — BP 150/88 | HR 68 | Ht 70.5 in | Wt 187.1 lb

## 2016-03-15 DIAGNOSIS — I340 Nonrheumatic mitral (valve) insufficiency: Secondary | ICD-10-CM | POA: Diagnosis not present

## 2016-03-15 NOTE — Progress Notes (Signed)
Cardiology Office Note Date:  03/15/2016   ID:  Stephanie Horne, DOB 1940/10/10, MRN [id number]  PCP:  Duwaine Maxin, DO  Cardiologist:  [name], MD    Chief Complaint  Patient presents with  . Hypertension  . Mitral Regurgitation  . Atrial Fibrillation     History of Present Illness: Stephanie Horne is a 75 y.o. female who presents for follow-up evaluation. She is followed for chronic atrial fibrillation, chronic diastolic heart failure, and severe mitral regurgitation. The patient has been unable to take warfarin over the years. She did not follow-up with INR is on a regular basis. Over the last 2 years she's developed progressive symptoms of heart failure. She has not required hospitalization, but has been significantly limited. When she was seen in 01/24/2015, she did not wish to pursue any further evaluation for her severe mitral regurgitation. However, at her visit earlier this year, she was willing to consider referral to a tertiary center for consideration of percutaneous mitral valve therapy. She subsequently has undergone TEE, right and left heart catheterization, formal physical therapy evaluation, CT scans of the chest, and formal cardiac surgical evaluation by Dr. Roxy Manns.  The patient is here with multiple family members today. She reports no change in symptoms. She denies orthopnea or PND. She has shortness of breath with low-level activity. The patient minimizes her symptoms, but family members noticed her shortness of breath with walking short distance. The patient just states that she paces herself like she always has. She stops and rests frequently. She hasn't noticed progressive symptoms over the last year. She does admit to leg swelling. She doesn't take Lasix when she has to leave the house. She has no chest pain or pressure.    Past Medical History  Diagnosis Date  . Hyperlipidemia   . Hypertension   . Back pain, chronic   . Lupus ([place])     Treated with prednisone  indefinitely   . Seasonal allergies   . Mitral regurgitation   . Atrial fibrillation ([place])     not on coumidin because AIHA and major bleeding complications  . Axillary lymphadenopathy     left negative MMG 1/08  . AIHA (autoimmune hemolytic anemia) ([place])     12/08 onset  . Diabetes mellitus   . Complicated grief [id number]    Death of husband in January 24, 2011   . Mitral regurgitation 10/24/2006    2d Echo from 2008-01-24 - EF 60%, Moderated MR      Past Surgical History  Procedure Laterality Date  . Eye surgery      cataract in both eyes  . Tee without cardioversion N/A 12/09/2015    Procedure: TRANSESOPHAGEAL ECHOCARDIOGRAM (TEE);  Surgeon: [name], MD;  Location: [facility] ENDOSCOPY;  Service: Cardiovascular;  Laterality: N/A;  . Cardiac catheterization N/A 01/07/2016    Procedure: Right/Left Heart Cath and Coronary Angiography;  Surgeon: [name], MD;  Location: [place] CV LAB;  Service: Cardiovascular;  Laterality: N/A;    Current Outpatient Prescriptions  Medication Sig Dispense Refill  . apixaban (ELIQUIS) 5 MG TABS tablet Take 1 tablet (5 mg total) by mouth 2 (two) times daily. 60 tablet 6  . Calcium Carbonate-Vitamin D (CALTRATE 600+D) 600-400 MG-UNIT per tablet Take 1 tablet by mouth 3 (three) times daily with meals. 90 tablet 5  . diclofenac sodium (VOLTAREN) 1 % GEL Apply 1 application topically 2 (two) times daily as needed. For leg, left hip and foot pain 1 Tube 5  .  fluticasone (FLONASE) 50 MCG/ACT nasal spray Place 2 sprays into both nostrils daily. 16 g 1  . furosemide (LASIX) 40 MG tablet TAKE 1 TABLET (40 MG TOTAL) BY MOUTH DAILY. 90 tablet 1  . glucose blood (ONE TOUCH ULTRA TEST) test strip Use to check blood sugar 1 to 2 times daily. diag code E11.9. Non insulin dependent 100 each 3  . levocetirizine (XYZAL) 5 MG tablet TAKE 0.5 TABLETS (2.5 MG TOTAL) BY MOUTH EVERY EVENING. 30 tablet 1  . losartan (COZAAR) 100 MG tablet Take 1 tablet (100 mg total) by mouth daily. 90  tablet 1  . metoprolol (LOPRESSOR) 50 MG tablet TAKE 1 TABLET (50 MG TOTAL) BY MOUTH 2 (TWO) TIMES DAILY. 180 tablet 1  . nitroGLYCERIN (NITROSTAT) 0.4 MG SL tablet Place 0.4 mg under the tongue every 5 (five) minutes as needed for chest pain.    Glory Rosebush DELICA LANCETS FINE MISC 1 each by Does not apply route daily. 100 each 3  . Polyethyl Glycol-Propyl Glycol (SYSTANE OP) Place 1 drop into both eyes daily.    . pravastatin (PRAVACHOL) 40 MG tablet TAKE 1 TABLET (40 MG TOTAL) BY MOUTH DAILY. 30 tablet 3  . predniSONE (DELTASONE) 10 MG tablet TAKE 1 TABLET (10 MG TOTAL) BY MOUTH DAILY WITH BREAKFAST. 90 tablet 1  . traZODone (DESYREL) 50 MG tablet TAKE 1 TABLET (50 MG TOTAL) BY MOUTH AT BEDTIME AS NEEDED FOR SLEEP. 90 tablet 1  . [DISCONTINUED] Calcium Carbonate-Vitamin D (CALTRATE 600+D PO) Take 600 mg by mouth daily.     No current facility-administered medications for this visit.    Allergies:   Metformin and related; Lisinopril; and Pollen extract   Social History:  The patient  reports that she has quit smoking. She does not have any smokeless tobacco history on file. She reports that she does not drink alcohol or use illicit drugs.   Family History:  The patient's family history includes Heart disease in her mother.    ROS:  Please see the history of present illness.  All other systems are reviewed and negative.    PHYSICAL EXAM: VS:  BP 150/88 mmHg  Pulse 68  Ht 5' 10.5" (1.791 m)  Wt 187 lb 1.9 oz (84.877 kg)  BMI 26.46 kg/m2 , BMI Body mass index is 26.46 kg/(m^2). GEN: Well nourished, well developed, in no acute distress HEENT: normal Neck: no JVD, no masses. No carotid bruits Cardiac: Irregular with grade 3/6 holosystolic murmur at the apex         Respiratory:  clear to auscultation bilaterally, normal work of breathing GI: soft, nontender, nondistended, + BS MS: no deformity or atrophy Ext: 1+ bilateral pretibial edema, pedal pulses 2+= bilaterally Skin: warm and  dry, no rash Neuro:  Strength and sensation are intact Psych: euthymic mood, full affect  EKG:  EKG is not ordered today.  Recent Labs: 12/04/2015: Brain Natriuretic Peptide 235.1* 01/07/2016: BUN 14; Creatinine, Ser 0.72; Hemoglobin 13.7; Platelets 119*; Potassium 4.0; Sodium 145   Lipid Panel     Component Value Date/Time   CHOL 148 06/04/2014 1418   TRIG 147 06/04/2014 1418   HDL 47 06/04/2014 1418   CHOLHDL 3.1 06/04/2014 1418   VLDL 29 06/04/2014 1418   LDLCALC 72 06/04/2014 1418      Wt Readings from Last 3 Encounters:  03/15/16 187 lb 1.9 oz (84.877 kg)  03/03/16 186 lb 9.6 oz (84.641 kg)  02/08/16 180 lb (81.647 kg)     Cardiac Studies  Reviewed: TEE 12/09/2015: Study Conclusions  - Left ventricle: The cavity size was normal. Wall thickness was  increased in a pattern of mild LVH. Systolic function was normal.  The estimated ejection fraction was in the range of 60% to 65%.  Wall motion was normal; there were no regional wall motion  abnormalities. - Aortic valve: There was no stenosis. - Aorta: Normal caliber aorta with mild plaque. - Mitral valve: There was severe mitral regurgitation with two jets  noted, one larger than the other. PISA was done on larger jet,  ERO 0.42 cm^2. There was severe prolapse but not flail of the  A1/A2 segments of the anterior leaflet. This is where the mitral  regurgitation originated. There was systolic flattening of the  pulmonary vein PW doppler tracing. - Left atrium: The atrium was severely dilated. No evidence of  thrombus in the atrial cavity or appendage. - Right ventricle: The cavity size was normal. Systolic function  was normal. - Right atrium: The atrium was mildly dilated. - Atrial septum: No defect or patent foramen ovale was identified.  Echo contrast study showed no right-to-left atrial level shunt,  at baseline or with provocation. - Tricuspid valve: There was moderate regurgitation. Peak RV-RA   gradient (S): 51 mm Hg.  Cardiac Cath: Conclusion     Mid RCA lesion, 20% stenosed.  Ost LAD to Prox LAD lesion, 25% stenosed.  1. Patent coronary arteries with mild nonobstructive CAD 2. Normal LV systolic function 3. Severe mitral regurgitation 4. Moderate pulmonary HTN 5. Patent abdominal aorta and iliac arteries (severe scoliosis noted)  Continue evaluation for mitral valve surgery versus Mitra-Clip   CT angio: IMPRESSION: Left and right atrial enlargement with enlargement and left atrial appendage. This is consistent with the given clinical history. Changes of pulmonary arterial hypertension are also noted.  No thoracic aortic aneurysm is noted. No pulmonary emboli are noted.  The emphysematous changes with some stable nodular changes on the right although there are new nodular changes along the major fissure. The largest of these measures 1.3 cm. Some associated smaller nodules are identified varying in size from 5 to 9 mm. Neoplasm deserves consideration and further workup is recommended. PET-CT may be helpful in this regard.  Atherosclerotic changes in the abdominal aorta and iliac vessels without significant stenosis.  Post catheterization hematoma in the right inguinal region.  ASSESSMENT AND PLAN: 1.  Chronic systolic heart failure, NYHA functional class III: We discussed options at length again today. I think her medical program is optimized. She has reasonable insight into her treatment options in her family seems to have good insight especially after 2 visits with Dr. Roxy Manns. They do not want to pursue cardiac surgery considering her limited functional capacity and I would tend to agree that her recovery from conventional surgery would be very difficult. She has multiple comorbid conditions including severe kyphoscoliosis, chronic steroids in the setting of lupus and rheumatoid arthritis, and poor functional capacity. They would like to pursue referral for the  possibility of a mitra-clip procedure. Review of her TEE suggests primarily degenerative mitral regurgitation with anterior leaflet prolapse. Will send TEE, cardiac catheterization, and other records to Dr. Bridgette Habermann for review and will make formal referral for consideration of mitra-clip.  2. CAD, mild, nonobstructive. Cath findings reviewed. No anginal symptoms.   3. Chronic atrial fibrillation: Continue Eliquis. Heart rate is controlled.  Current medicines are reviewed with the patient today.  The patient does not have concerns regarding medicines.  Labs/ tests ordered  today include:  No orders of the defined types were placed in this encounter.   Disposition:   FU 4 months  Signed, [name], MD  03/15/2016 8:58 AM    [address], [place], [place]  [address] Phone: [phone]; Fax: [phone]

## 2016-03-15 NOTE — Patient Instructions (Signed)
Medication Instructions:  Your physician recommends that you continue on your current medications as directed. Please refer to the Current Medication list given to you today.  Labwork: No new orders.   Testing/Procedures: No new orders.   Follow-Up: Your physician wants you to follow-up in: 4 MONTHS with Dr Burt Knack (October 2017).  You will receive a reminder letter in the mail two months in advance. If you don't receive a letter, please call our office to schedule the follow-up appointment.   Any Other Special Instructions Will Be Listed Below (If Applicable).     If you need a refill on your cardiac medications before your next appointment, please call your pharmacy.

## 2016-03-16 ENCOUNTER — Encounter: Payer: Self-pay | Admitting: *Deleted

## 2016-03-17 ENCOUNTER — Telehealth: Payer: Self-pay

## 2016-03-17 NOTE — Telephone Encounter (Signed)
Stephanie Horne is a 75 y.o. female. Contacted her daughter Stephanie Horne) via telephone for monitoring of apixaban (Eliquis) therapy.    ASSESSMENT Indication(s): atrial fibrillation Duration: indefinite  Labs:    Component Value Date/Time   AST 19 06/04/2014 1418   ALT 14 06/04/2014 1418   NA 145 01/07/2016 0909   K 4.0 01/07/2016 0909   CL 108 01/07/2016 0909   CO2 24 01/07/2016 0909   GLUCOSE 113* 01/07/2016 0909   HGBA1C 6.4 03/03/2016 1010   HGBA1C 6.6 11/24/2009 1456   BUN 14 01/07/2016 0909   CREATININE 0.72 01/07/2016 0909   CREATININE 0.89 12/04/2015 1020   CALCIUM 9.1 01/07/2016 0909   GFRNONAA >60 01/07/2016 0909   GFRNONAA 85 01/15/2015 1222   GFRAA >60 01/07/2016 0909   GFRAA >89 01/15/2015 1222   WBC 4.1 01/07/2016 0909   WBC 5.4 10/07/2008 1419   HGB 13.7 01/07/2016 0909   HGB 14.4 10/07/2008 1419   HCT 42.0 01/07/2016 0909   HCT 43.3 10/07/2008 1419   PLT 119* 01/07/2016 0909   PLT 150 10/07/2008 1419    apixaban (Eliquis) Dose: 5 mg BID  Safety: Patient has not had recent bleeding/thromboembolic events. Patient reports no recent signs or symptoms of bleeding, no signs of symptoms of thromboembolism. Medication changes: no.  Adherence: Patient reports adherence challenges. Tried multiple avenues via Medicare and Eliquis programs and denied due to making above the cut off. Will be giving more samples. Patient does correctly recite the dose.   Patient Instructions: Patient advised to contact clinic or seek medical attention if signs/symptoms of bleeding or thromboembolism occur. Patient verbalized understanding by repeating back information.  Follow-up Next appointment not scheduled for follow-up with [facility].  Angelena Form PharmD Candidate  03/17/2016, 1:44 PM

## 2016-03-22 ENCOUNTER — Encounter: Payer: Self-pay | Admitting: Pharmacist

## 2016-03-22 ENCOUNTER — Ambulatory Visit (INDEPENDENT_AMBULATORY_CARE_PROVIDER_SITE_OTHER): Payer: Medicare Other | Admitting: Pharmacist

## 2016-03-22 DIAGNOSIS — Z596 Low income: Secondary | ICD-10-CM | POA: Diagnosis not present

## 2016-03-22 DIAGNOSIS — Z7189 Other specified counseling: Secondary | ICD-10-CM | POA: Diagnosis not present

## 2016-03-22 DIAGNOSIS — Z719 Counseling, unspecified: Secondary | ICD-10-CM

## 2016-03-22 DIAGNOSIS — Z79899 Other long term (current) drug therapy: Secondary | ICD-10-CM

## 2016-03-22 NOTE — Patient Instructions (Signed)
Patient educated about medication as defined in this encounter and verbalized understanding by repeating back instructions provided.   

## 2016-03-22 NOTE — Progress Notes (Signed)
Patient states she cannot afford the apixaban and is working on completing paperwork for a tier exception.  Medication Samples have been provided to the patient.  Drug: apixaban (Eliquis) Strength: 5 mg Qty: 28 LOT: [id number] Exp.Date: 01/2018 Dosing instructions: Take 1 tablet PO BID  The patient has been instructed regarding the correct time, dose, and frequency of taking this medication, including desired effects and most common side effects.   Matteo Banke J 10:39 AM 03/22/2016

## 2016-03-22 NOTE — Telephone Encounter (Signed)
Patient was contacted  by Hailey Hill, PharmD candidate. I agree with the assessment and plan of care documented. 

## 2016-03-22 NOTE — Addendum Note (Signed)
Addended by: Forde Dandy on: 03/22/2016 12:26 PM   Modules accepted: Orders, Medications

## 2016-03-28 ENCOUNTER — Other Ambulatory Visit: Payer: Self-pay

## 2016-03-29 ENCOUNTER — Telehealth: Payer: Self-pay | Admitting: Cardiovascular Disease

## 2016-03-29 NOTE — Telephone Encounter (Signed)
New message     The pt daughter is calling to get the form back from Dr. Burt Knack last week, the daughter is calling to get the information about the pt surgery and wants to know what to do for her.

## 2016-03-29 NOTE — Telephone Encounter (Signed)
Patient's daughter called to see what to do to have the patient seen by the specialist at [facility]. Informed her that per Dr. Antionette Char note, all notes were sent to Dr. Bridgette Habermann and a formal referral was placed. Instructed her to call [facility] if Dr. Evie Lacks office does not call to schedule an appointment soon. She was grateful for call.

## 2016-03-29 NOTE — Progress Notes (Signed)
I reviewed Dr. Kim's note.  

## 2016-04-04 NOTE — Telephone Encounter (Signed)
I contacted [facility] and the pt is scheduled to see Dr Bridgette Habermann on 04/19/16.

## 2016-04-12 ENCOUNTER — Ambulatory Visit (INDEPENDENT_AMBULATORY_CARE_PROVIDER_SITE_OTHER): Payer: Medicare Other | Admitting: Pharmacist

## 2016-04-12 DIAGNOSIS — I10 Essential (primary) hypertension: Secondary | ICD-10-CM

## 2016-04-12 DIAGNOSIS — J309 Allergic rhinitis, unspecified: Secondary | ICD-10-CM

## 2016-04-12 DIAGNOSIS — Z79899 Other long term (current) drug therapy: Secondary | ICD-10-CM | POA: Diagnosis not present

## 2016-04-12 DIAGNOSIS — G8929 Other chronic pain: Secondary | ICD-10-CM

## 2016-04-12 DIAGNOSIS — Z888 Allergy status to other drugs, medicaments and biological substances status: Secondary | ICD-10-CM

## 2016-04-12 DIAGNOSIS — Z7189 Other specified counseling: Secondary | ICD-10-CM

## 2016-04-12 DIAGNOSIS — Z91048 Other nonmedicinal substance allergy status: Secondary | ICD-10-CM

## 2016-04-12 DIAGNOSIS — M545 Low back pain: Secondary | ICD-10-CM

## 2016-04-12 DIAGNOSIS — G4700 Insomnia, unspecified: Secondary | ICD-10-CM

## 2016-04-12 MED ORDER — FUROSEMIDE 40 MG PO TABS: ORAL_TABLET | ORAL | Status: DC

## 2016-04-12 MED ORDER — TRAZODONE HCL 50 MG PO TABS: ORAL_TABLET | ORAL | Status: DC

## 2016-04-12 MED ORDER — APIXABAN 5 MG PO TABS: 5.0000 mg | ORAL_TABLET | Freq: Two times a day (BID) | ORAL | Status: DC

## 2016-04-12 MED ORDER — METOPROLOL TARTRATE 50 MG PO TABS: ORAL_TABLET | ORAL | Status: DC

## 2016-04-12 MED ORDER — PRAVASTATIN SODIUM 40 MG PO TABS: 40.0000 mg | ORAL_TABLET | Freq: Every day | ORAL | Status: DC

## 2016-04-12 MED ORDER — NITROGLYCERIN 0.4 MG SL SUBL: 0.4000 mg | SUBLINGUAL_TABLET | SUBLINGUAL | Status: DC | PRN

## 2016-04-12 MED ORDER — LOSARTAN POTASSIUM 100 MG PO TABS: 100.0000 mg | ORAL_TABLET | Freq: Every day | ORAL | Status: DC

## 2016-04-12 MED ORDER — DICLOFENAC SODIUM 1 % TD GEL: 1.0000 "application " | Freq: Two times a day (BID) | TRANSDERMAL | Status: DC | PRN

## 2016-04-12 MED ORDER — LEVOCETIRIZINE DIHYDROCHLORIDE 5 MG PO TABS
2.5000 mg | ORAL_TABLET | Freq: Every evening | ORAL | Status: DC
Start: 2016-04-12 — End: 2016-05-05

## 2016-04-12 NOTE — Progress Notes (Signed)
Reviewed and agree.

## 2016-04-12 NOTE — Progress Notes (Signed)
S: Stephanie Horne is a 75 y.o. female reports to clinical pharmacist appointment for help with medications. Patient did not bring medication bottles. Patient is accompanied by family, who assist at home with medication management.  Allergies  Allergen Reactions  . Metformin And Related     Diarrhea that was felt to be 2/2 metformin so trial of it was attempted.  . Lisinopril     cough  . Pollen Extract Other (See Comments)    Congestion, runny nose, phlegm   Medication Sig  apixaban (ELIQUIS) 5 MG TABS tablet Take 1 tablet (5 mg total) by mouth 2 (two) times daily.  Calcium Carbonate-Vitamin D (CALTRATE 600+D) 600-400 MG-UNIT per tablet Take 1 tablet by mouth 3 (three) times daily with meals.  diclofenac sodium (VOLTAREN) 1 % GEL Apply 1 application topically 2 (two) times daily as needed. For leg, left hip and foot pain  furosemide (LASIX) 40 MG tablet TAKE 1 TABLET (40 MG TOTAL) BY MOUTH DAILY.  glucose blood (ONE TOUCH ULTRA TEST) test strip Use to check blood sugar 1 to 2 times daily. diag code E11.9. Non insulin dependent  levocetirizine (XYZAL) 5 MG tablet Take 0.5 tablets (2.5 mg total) by mouth every evening.  losartan (COZAAR) 100 MG tablet Take 1 tablet (100 mg total) by mouth daily.  metoprolol (LOPRESSOR) 50 MG tablet TAKE 1 TABLET (50 MG TOTAL) BY MOUTH 2 (TWO) TIMES DAILY.  nitroGLYCERIN (NITROSTAT) 0.4 MG SL tablet Place 1 tablet (0.4 mg total) under the tongue every 5 (five) minutes as needed for chest pain.  ONETOUCH DELICA LANCETS FINE MISC 1 each by Does not apply route daily.  Polyethyl Glycol-Propyl Glycol (SYSTANE OP) Place 1 drop into both eyes daily.  pravastatin (PRAVACHOL) 40 MG tablet Take 1 tablet (40 mg total) by mouth daily.  traZODone (DESYREL) 50 MG tablet TAKE 1 TABLET (50 MG TOTAL) BY MOUTH AT BEDTIME AS NEEDED FOR SLEEP.   Past Medical History  Diagnosis Date  . Hyperlipidemia   . Hypertension   . Back pain, chronic   . Lupus ([place])     Treated with  prednisone indefinitely   . Seasonal allergies   . Mitral regurgitation   . Atrial fibrillation ([place])     not on coumidin because AIHA and major bleeding complications  . Axillary lymphadenopathy     left negative MMG 1/08  . AIHA (autoimmune hemolytic anemia) ([place])     12/08 onset  . Diabetes mellitus   . Complicated grief [id number]    Death of husband in 2011-01-24   . Mitral regurgitation 10/24/2006    2d Echo from 01-24-08 - EF 60%, Moderated MR     Social History   Social History  . Marital Status: Married    Spouse Name: N/A  . Number of Children: N/A  . Years of Education: N/A   Social History Main Topics  . Smoking status: Former Research scientist (life sciences)  . Smokeless tobacco: Not on file  . Alcohol Use: No  . Drug Use: No  . Sexual Activity: Not on file     Comment: married, retired Animal nutritionist, no smoking, alochol, illegal drug, her husband was clinic patient and died of lung cancer   Other Topics Concern  . Not on file   Social History Narrative   Family History  Problem Relation Age of Onset  . Heart disease Mother    O:    Component Value Date/Time   CHOL 148 06/04/2014 1418   HDL 47 06/04/2014 1418  TRIG 147 06/04/2014 1418   AST 19 06/04/2014 1418   ALT 14 06/04/2014 1418   NA 145 01/07/2016 0909   K 4.0 01/07/2016 0909   CL 108 01/07/2016 0909   CO2 24 01/07/2016 0909   GLUCOSE 113* 01/07/2016 0909   HGBA1C 6.4 03/03/2016 1010   HGBA1C 6.6 11/24/2009 1456   BUN 14 01/07/2016 0909   CREATININE 0.72 01/07/2016 0909   CREATININE 0.89 12/04/2015 1020   CALCIUM 9.1 01/07/2016 0909   GFRAA >60 01/07/2016 0909   GFRAA >89 01/15/2015 1222   WBC 4.1 01/07/2016 0909   WBC 5.4 10/07/2008 1419   HGB 13.7 01/07/2016 0909   HGB 14.4 10/07/2008 1419   HCT 42.0 01/07/2016 0909   HCT 43.3 10/07/2008 1419   PLT 119* 01/07/2016 0909   PLT 150 10/07/2008 1419   TSH 0.604 06/28/2011 0847   Ht Readings from Last 2 Encounters:  03/15/16 5' 10.5" (1.791 m)  03/03/16 5\' 10"   (1.778 m)   Wt Readings from Last 2 Encounters:  03/15/16 187 lb 1.9 oz (84.877 kg)  03/03/16 186 lb 9.6 oz (84.641 kg)   There is no weight on file to calculate BMI. BP Readings from Last 3 Encounters:  03/15/16 150/88  03/03/16 142/72  02/08/16 150/90   A/P: Patient states she needs the most help with cost due to copay for apixaban ($45/month).  Medication Samples and patient education have been provided to the patient:   Drug: apixaban (Eliquis) Strength: 5 mg Qty: 56 tablets LOT: [id number] Exp.Date: 06/2018   Dosing instructions: 1 tablet BID  Transfer medications to mail order to help with affordability ($0 copay per 90-day supply for most generics)---approved by patient, daughter, and PCP.  Patient also requested refills of levocetirizine for allergic rhinitis (stated she prefers to d/c the fluticasone for now) for runny/itchy nose. She also requested refills on diclofenac gel and trazodone which were all approved in anticipation of upcoming appointment 05/05/16 by PCP.  An after visit summary was provided and patient advised to follow up if any changes in condition or questions regarding medications arise.   The patient and family verbalized understanding of information provided by repeating back concepts discussed.   15 minutes spent face-to-face with the patient during the encounter. 50% of time spent on education. 50% of time was spent on assessment, plan, coordination of care.

## 2016-04-12 NOTE — Patient Instructions (Signed)
Patient educated about medication as defined in this encounter and verbalized understanding by repeating back instructions provided.   

## 2016-04-19 DIAGNOSIS — I340 Nonrheumatic mitral (valve) insufficiency: Secondary | ICD-10-CM | POA: Diagnosis not present

## 2016-04-25 ENCOUNTER — Inpatient Hospital Stay: Admission: RE | Admit: 2016-04-25 | Payer: Self-pay | Source: Ambulatory Visit

## 2016-04-27 ENCOUNTER — Telehealth: Payer: Self-pay | Admitting: Cardiovascular Disease

## 2016-04-27 DIAGNOSIS — I059 Rheumatic mitral valve disease, unspecified: Secondary | ICD-10-CM

## 2016-04-27 NOTE — Telephone Encounter (Signed)
Pts daughter is calling, as instructed by Dr Bridgette Habermann, to have Dr Burt Knack order pre-procedure labs on this pt, prior to her scheduled Surgery with him on 8/9.  Pts daughter states that Dr Burt Knack referred them to Dr Bridgette Habermann at [facility], for mitral clip. Informed the pts daughter that both Dr Burt Knack and Ander Purpura RN are out of the office, but will return on Friday.  Informed the pts daughter that I will route this message to Dr Burt Knack and RN for further review and recommendation of lab orders requested.  Informed the pts daughter that someone will follow-up with either party, once recommendations are given.  Daughter verbalized understanding and agrees with this plan.

## 2016-04-27 NOTE — Telephone Encounter (Signed)
New Message  Pt having surgery 8.9. Patient daughter calling to speak with nurse, wanted to know if she can have lab work before surgery. Please Follow-up. wy

## 2016-04-29 ENCOUNTER — Encounter: Payer: Self-pay | Admitting: Cardiovascular Disease

## 2016-04-29 NOTE — Telephone Encounter (Signed)
I spoke with Colletta [place] and the only thing that they need drawn prior to surgery is a CBC and BMP.  The results can be faxed to [phone], Attn: Stephanie.  I spoke with the Stephanie Horne's daughter and scheduled lab appointment on 05/04/16.

## 2016-04-29 NOTE — Telephone Encounter (Signed)
I spoke with the pt's [name] and she does not have any paperwork or orders in regards to what the pt requires prior to surgery.  She did provide me with Stephanie's phone # [phone] at Dr Rinaldi's office.  I have left Colletta [place] a voicemail to contact our office to clarify what the pt requires prior to surgery 05/11/16.

## 2016-04-29 NOTE — Telephone Encounter (Signed)
Follow up ° ° ° ° ° °Returning the nurses call ° ° ° ° ° ° °

## 2016-04-29 NOTE — Telephone Encounter (Signed)
This encounter was created in error - please disregard.

## 2016-05-04 ENCOUNTER — Encounter (INDEPENDENT_AMBULATORY_CARE_PROVIDER_SITE_OTHER): Payer: Self-pay

## 2016-05-04 ENCOUNTER — Other Ambulatory Visit (INDEPENDENT_AMBULATORY_CARE_PROVIDER_SITE_OTHER): Payer: Medicare Other

## 2016-05-04 ENCOUNTER — Telehealth: Payer: Self-pay | Admitting: Internal Medicine

## 2016-05-04 DIAGNOSIS — I059 Rheumatic mitral valve disease, unspecified: Secondary | ICD-10-CM | POA: Diagnosis not present

## 2016-05-04 LAB — CBC
HEMATOCRIT: 38.5 % (ref 35.0–45.0)
HEMOGLOBIN: 12.7 g/dL (ref 11.7–15.5)
MCH: 30.8 pg (ref 27.0–33.0)
MCHC: 33 g/dL (ref 32.0–36.0)
MCV: 93.2 fL (ref 80.0–100.0)
MPV: 12.2 fL (ref 7.5–12.5)
Platelets: 139 10*3/uL — ABNORMAL LOW (ref 140–400)
RBC: 4.13 MIL/uL (ref 3.80–5.10)
RDW: 14.4 % (ref 11.0–15.0)
WBC: 3.7 10*3/uL — ABNORMAL LOW (ref 3.8–10.8)

## 2016-05-04 LAB — BASIC METABOLIC PANEL
BUN: 12 mg/dL (ref 7–25)
CHLORIDE: 111 mmol/L — AB (ref 98–110)
CO2: 24 mmol/L (ref 20–31)
CREATININE: 0.8 mg/dL (ref 0.60–0.93)
Calcium: 8.8 mg/dL (ref 8.6–10.4)
Glucose, Bld: 132 mg/dL — ABNORMAL HIGH (ref 65–99)
Potassium: 3.4 mmol/L — ABNORMAL LOW (ref 3.5–5.3)
SODIUM: 147 mmol/L — AB (ref 135–146)

## 2016-05-04 NOTE — Telephone Encounter (Signed)
APT. REMINDER CALL, NO VOICEMAIL °

## 2016-05-05 ENCOUNTER — Encounter: Payer: Self-pay | Admitting: Internal Medicine

## 2016-05-05 ENCOUNTER — Ambulatory Visit (INDEPENDENT_AMBULATORY_CARE_PROVIDER_SITE_OTHER): Payer: Medicare Other | Admitting: Internal Medicine

## 2016-05-05 VITALS — BP 166/93 | HR 78 | Temp 97.8°F | Ht 70.5 in | Wt 181.5 lb

## 2016-05-05 DIAGNOSIS — J309 Allergic rhinitis, unspecified: Secondary | ICD-10-CM | POA: Diagnosis not present

## 2016-05-05 DIAGNOSIS — I340 Nonrheumatic mitral (valve) insufficiency: Secondary | ICD-10-CM

## 2016-05-05 DIAGNOSIS — M329 Systemic lupus erythematosus, unspecified: Secondary | ICD-10-CM

## 2016-05-05 DIAGNOSIS — I4891 Unspecified atrial fibrillation: Secondary | ICD-10-CM | POA: Diagnosis not present

## 2016-05-05 DIAGNOSIS — E119 Type 2 diabetes mellitus without complications: Secondary | ICD-10-CM | POA: Diagnosis not present

## 2016-05-05 DIAGNOSIS — E118 Type 2 diabetes mellitus with unspecified complications: Secondary | ICD-10-CM

## 2016-05-05 DIAGNOSIS — IMO0002 Reserved for concepts with insufficient information to code with codable children: Secondary | ICD-10-CM

## 2016-05-05 DIAGNOSIS — G4700 Insomnia, unspecified: Secondary | ICD-10-CM

## 2016-05-05 DIAGNOSIS — I4821 Permanent atrial fibrillation: Secondary | ICD-10-CM

## 2016-05-05 DIAGNOSIS — M545 Low back pain: Secondary | ICD-10-CM

## 2016-05-05 DIAGNOSIS — R0609 Other forms of dyspnea: Secondary | ICD-10-CM

## 2016-05-05 DIAGNOSIS — I10 Essential (primary) hypertension: Secondary | ICD-10-CM

## 2016-05-05 MED ORDER — METOPROLOL TARTRATE 50 MG PO TABS: ORAL_TABLET | ORAL | 1 refills | Status: DC

## 2016-05-05 MED ORDER — ONETOUCH DELICA LANCETS FINE MISC: 1.0000 | Freq: Every day | 5 refills | Status: DC

## 2016-05-05 MED ORDER — APIXABAN 5 MG PO TABS: 5.0000 mg | ORAL_TABLET | Freq: Two times a day (BID) | ORAL | 1 refills | Status: DC

## 2016-05-05 MED ORDER — LEVOCETIRIZINE DIHYDROCHLORIDE 5 MG PO TABS: 2.5000 mg | ORAL_TABLET | Freq: Every evening | ORAL | 0 refills | Status: DC

## 2016-05-05 MED ORDER — PREDNISONE 10 MG PO TABS: 10.0000 mg | ORAL_TABLET | Freq: Every day | ORAL | 1 refills | Status: DC

## 2016-05-05 MED ORDER — PRAVASTATIN SODIUM 40 MG PO TABS: 40.0000 mg | ORAL_TABLET | Freq: Every day | ORAL | 0 refills | Status: DC

## 2016-05-05 MED ORDER — NITROGLYCERIN 0.4 MG SL SUBL: 0.4000 mg | SUBLINGUAL_TABLET | SUBLINGUAL | 0 refills | Status: DC | PRN

## 2016-05-05 MED ORDER — TRAZODONE HCL 50 MG PO TABS: ORAL_TABLET | ORAL | 1 refills | Status: DC

## 2016-05-05 MED ORDER — FUROSEMIDE 40 MG PO TABS: ORAL_TABLET | ORAL | 1 refills | Status: DC

## 2016-05-05 MED ORDER — LOSARTAN POTASSIUM 100 MG PO TABS: 100.0000 mg | ORAL_TABLET | Freq: Every day | ORAL | 1 refills | Status: DC

## 2016-05-05 NOTE — Assessment & Plan Note (Addendum)
BP Readings from Last 3 Encounters:  05/05/16 (!) 166/93  03/15/16 (!) 150/88  03/03/16 (!) 142/72       Component Value Date/Time   NA 147 (H) 05/04/2016 1340   K 3.4 (L) 05/04/2016 1340   CREATININE 0.80 05/04/2016 1340   Assessment:  Patient with HTN compliant with anti-hypertensive therapy. Denies headaches, chest pain, edema, dizziness or lightheadedness. BP mildly elevated today in the setting of skipping Lassix dose. Goal of <150/90. At goal for last several checks. Will continue to monitor.  Plan:   -Medication changes: none  -Labs: none -Other plans: none -Encourage home BP monitoring 3 times per week or at drug store occasionally  -Encourage regular exercise and healthy diet (decreased salt intake)  Medications after today's visit: 1. Losartan 100mg  qD, Metoprolol 50mg  BID, Lassix 40mg  qD

## 2016-05-05 NOTE — Assessment & Plan Note (Signed)
Pertinent Labs: Lab Results  Component Value Date   HGBA1C 6.4 03/03/2016   HGBA1C 6.6 11/24/2009   CREATININE 0.80 05/04/2016   MICROALBUR 6.0 (H) 10/08/2014   MICRALBCREAT 30.3 (H) 10/08/2014   CHOL 148 06/04/2014   HDL 47 06/04/2014   TRIG 147 06/04/2014    Assessment: Disease Control:    well controlled  Progress toward goals:  at goal  Barriers to meeting goals: no barriers identified   Pt is diet controlled and compliant with current therapy. Denies polyuria, polydipsia, lightheadedness, tremors.  Plan: Glucometer log was reviewed today, as pt did have glucometer available for review.   Continue current management w/ diet  no instruction/counseling

## 2016-05-05 NOTE — Progress Notes (Signed)
CC: medication refill and routine f/u HPI: [name] is a 75 y.o. female with a h/o of DM, HTN, HLD, chronic AFib, Mitral regurgitation who presents for management of the above conditions and refill of her medications.  She has been doing well w/o acute complaints. She notes that her BP and BG are both well controlled at home. She is doing well with her diet and staying active. She reports only occasional low back pain which she is not currently treating w/ medication.  She also notes that she will have an operation on her mitral valve in [place] next week as referred by her Cardiologist.  Please see Problem-based charting for HPI and the status of patient's chronic medical conditions.  Past Medical History:  Diagnosis Date  . AIHA (autoimmune hemolytic anemia) ([place])    12/08 onset  . Atrial fibrillation ([place])    not on coumidin because AIHA and major bleeding complications  . Axillary lymphadenopathy    left negative MMG 1/08  . Back pain, chronic   . Complicated grief [id number]   Death of husband in 01/15/11   . Diabetes mellitus   . Hyperlipidemia   . Hypertension   . Lupus ([place])    Treated with prednisone indefinitely   . Mitral regurgitation   . Mitral regurgitation 10/24/2006   2d Echo from Jan 15, 2008 - EF 60%, Moderated MR    . Seasonal allergies     Social Hx: Retired Education officer, museum, Engineer, structural, and Environmental education officer. She has lived an exciting life. Her husband and son have passed away and she retired in order to care for them during their illnesses.  Review of Systems: ROS in HPI. Otherwise: Review of Systems  Constitutional: Negative for chills, fever and weight loss.  Respiratory: Negative for cough and shortness of breath.   Cardiovascular: Negative for chest pain and leg swelling.  Gastrointestinal: Negative for abdominal pain, constipation, diarrhea, nausea and vomiting.  Genitourinary: Negative for dysuria, frequency and urgency.    Musculoskeletal: Positive for back pain.    Physical Exam: Vitals:   05/05/16 1406  BP: (!) 166/93  Pulse: 78  Temp: 97.8 F (36.6 C)  TempSrc: Oral  Weight: 181 lb 8 oz (82.3 kg)  Height: 5' 10.5" (1.791 m)   Physical Exam  Constitutional: She appears well-developed. She is cooperative. No distress.  Cardiovascular: Normal rate, regular rhythm and normal pulses.  Exam reveals no gallop.   Murmur (mitral regurg at apex) heard. Pulmonary/Chest: Effort normal and breath sounds normal. No respiratory distress. She has no wheezes. She has no rhonchi. She has no rales. Breasts are symmetrical.  Abdominal: Soft. Bowel sounds are normal. There is no tenderness.  Musculoskeletal: She exhibits no edema.    Assessment & Plan:  See encounters tab for problem based medical decision making. Patient seen with Dr. Beryle Beams  Diabetes [facility]) Pertinent Labs: Lab Results  Component Value Date   HGBA1C 6.4 03/03/2016   HGBA1C 6.6 11/24/2009   CREATININE 0.80 05/04/2016   MICROALBUR 6.0 (H) 10/08/2014   MICRALBCREAT 30.3 (H) 10/08/2014   CHOL 148 06/04/2014   HDL 47 06/04/2014   TRIG 147 06/04/2014    Assessment: Disease Control:    well controlled  Progress toward goals:  at goal  Barriers to meeting goals: no barriers identified   Pt is diet controlled and compliant with current therapy. Denies polyuria, polydipsia, lightheadedness, tremors.  Plan: Glucometer log was reviewed today, as pt did have glucometer available for review.  Continue current management w/ diet  no instruction/counseling    Atrial fibrillation (HCC) Refilled medication today. Meeting w/ Dr. Maudie Mercury to discuss options for financial assistance. No symptoms of palpitations, tachycardia, lightheadedness. No bleeding.  Lupus ([place]) Symptoms stable on 10mg  prednisone, on chronic steroids x many years. Refilled today. Denies adverse side effects.  Low back pain Currently bothering her intermittently.  Usually treated with rest. Recommended Advil 200-400mg  q6 hrs PRN. Renal function able to tolerate. Advised to take with food.  Essential hypertension BP Readings from Last 3 Encounters:  05/05/16 (!) 166/93  03/15/16 (!) 150/88  03/03/16 (!) 142/72       Component Value Date/Time   NA 147 (H) 05/04/2016 1340   K 3.4 (L) 05/04/2016 1340   CREATININE 0.80 05/04/2016 1340   Assessment:  Patient with HTN compliant with anti-hypertensive therapy. Denies headaches, chest pain, edema, dizziness or lightheadedness. BP mildly elevated today in the setting of skipping Lassix dose. Goal of <150/90. At goal for last several checks. Will continue to monitor.  Plan:   -Medication changes: none  -Labs: none -Other plans: none -Encourage home BP monitoring 3 times per week or at drug store occasionally  -Encourage regular exercise and healthy diet (decreased salt intake)  Medications after today's visit: 1. Losartan 100mg  qD, Metoprolol 50mg  BID, Lassix 40mg  qD    Allergic rhinitis Doing well on current therapy. Continue levocetirizine.  Mitral regurgitation Pt has operation coming up next week for mitral valve regurgitation. Followed by Cardiology.   Signed: Holley Raring, MD 05/05/2016, 3:58 PM  Pager: [phone]

## 2016-05-05 NOTE — Assessment & Plan Note (Signed)
Doing well on current therapy. Continue levocetirizine.

## 2016-05-05 NOTE — Assessment & Plan Note (Signed)
Currently bothering her intermittently. Usually treated with rest. Recommended Advil 200-400mg  q6 hrs PRN. Renal function able to tolerate. Advised to take with food.

## 2016-05-05 NOTE — Assessment & Plan Note (Signed)
Symptoms stable on 10mg  prednisone, on chronic steroids x many years. Refilled today. Denies adverse side effects.

## 2016-05-05 NOTE — Assessment & Plan Note (Signed)
Pt has operation coming up next week for mitral valve regurgitation. Followed by Cardiology.

## 2016-05-05 NOTE — Assessment & Plan Note (Addendum)
Refilled medication today. Meeting w/ Dr. Maudie Mercury to discuss options for financial assistance. No symptoms of palpitations, tachycardia, lightheadedness. No bleeding.

## 2016-05-05 NOTE — Patient Instructions (Signed)
Keep up the good work! You're doing great!

## 2016-05-05 NOTE — Progress Notes (Signed)
Medicine attending: I personally interviewed and briefly examined this patient on the day of the patient visit and reviewed pertinent clinical and ,laboratory data  with resident physician Dr. Holley Raring and we discussed a management plan. Medically stable to proceed with planned procedure on her mitral valve.

## 2016-05-06 ENCOUNTER — Encounter: Payer: Self-pay | Admitting: Pharmacist

## 2016-05-06 ENCOUNTER — Encounter: Payer: Self-pay | Admitting: Cardiovascular Disease

## 2016-05-06 ENCOUNTER — Other Ambulatory Visit: Payer: Self-pay

## 2016-05-06 MED ORDER — POTASSIUM CHLORIDE ER 10 MEQ PO TBCR: 10.0000 meq | EXTENDED_RELEASE_TABLET | Freq: Every day | ORAL | 3 refills | Status: DC

## 2016-05-06 NOTE — Progress Notes (Signed)
Patient ID: Stephanie Horne, female   DOB: [birth year], 75 y.o.   MRN: [id number]  Patient was reviewed for the DM + steroid QI service. Patient has been on prednisone for years with controlled A1C. No further intervention needed at this time.

## 2016-05-06 NOTE — Telephone Encounter (Signed)
New Message:    She says she needs the pt lab results please,

## 2016-05-06 NOTE — Telephone Encounter (Signed)
This encounter was created in error - please disregard.

## 2016-05-06 NOTE — Progress Notes (Addendum)
Patient ID: Stephanie Horne, female   DOB: Feb 27, 1941, 75 y.o.   MRN: [id number]  Medication Samples have been provided to the patient.  Drug name: apixaban (Eliquis)       Strength: 5 mg        Qty: 28  LOT: [id number]  Exp.Date: 9/19  Dosing instructions: 1 tablet BID  The patient has been instructed regarding the correct time, dose, and frequency of taking this medication, including desired effects and most common side effects.   Recommend LFT monitoring.  Breelle Hollywood J 5:11 PM 05/06/2016

## 2016-05-11 DIAGNOSIS — I340 Nonrheumatic mitral (valve) insufficiency: Secondary | ICD-10-CM | POA: Diagnosis not present

## 2016-05-11 DIAGNOSIS — M40205 Unspecified kyphosis, thoracolumbar region: Secondary | ICD-10-CM | POA: Diagnosis not present

## 2016-05-11 DIAGNOSIS — D591 Other autoimmune hemolytic anemias: Secondary | ICD-10-CM | POA: Diagnosis not present

## 2016-05-11 DIAGNOSIS — I5032 Chronic diastolic (congestive) heart failure: Secondary | ICD-10-CM | POA: Diagnosis not present

## 2016-05-11 DIAGNOSIS — I272 Other secondary pulmonary hypertension: Secondary | ICD-10-CM | POA: Diagnosis not present

## 2016-05-11 DIAGNOSIS — E119 Type 2 diabetes mellitus without complications: Secondary | ICD-10-CM | POA: Diagnosis not present

## 2016-05-11 DIAGNOSIS — I482 Chronic atrial fibrillation: Secondary | ICD-10-CM | POA: Diagnosis not present

## 2016-05-11 DIAGNOSIS — M329 Systemic lupus erythematosus, unspecified: Secondary | ICD-10-CM | POA: Diagnosis not present

## 2016-05-11 DIAGNOSIS — J449 Chronic obstructive pulmonary disease, unspecified: Secondary | ICD-10-CM | POA: Diagnosis not present

## 2016-05-11 DIAGNOSIS — E785 Hyperlipidemia, unspecified: Secondary | ICD-10-CM | POA: Diagnosis not present

## 2016-05-11 DIAGNOSIS — Q211 Atrial septal defect: Secondary | ICD-10-CM | POA: Diagnosis not present

## 2016-05-11 DIAGNOSIS — Z7952 Long term (current) use of systemic steroids: Secondary | ICD-10-CM | POA: Diagnosis not present

## 2016-05-11 DIAGNOSIS — Z7901 Long term (current) use of anticoagulants: Secondary | ICD-10-CM | POA: Diagnosis not present

## 2016-05-11 DIAGNOSIS — I2510 Atherosclerotic heart disease of native coronary artery without angina pectoris: Secondary | ICD-10-CM | POA: Diagnosis not present

## 2016-05-11 DIAGNOSIS — I10 Essential (primary) hypertension: Secondary | ICD-10-CM | POA: Diagnosis not present

## 2016-05-11 DIAGNOSIS — I081 Rheumatic disorders of both mitral and tricuspid valves: Secondary | ICD-10-CM | POA: Diagnosis not present

## 2016-05-12 DIAGNOSIS — Z7901 Long term (current) use of anticoagulants: Secondary | ICD-10-CM | POA: Diagnosis not present

## 2016-05-12 DIAGNOSIS — J449 Chronic obstructive pulmonary disease, unspecified: Secondary | ICD-10-CM | POA: Diagnosis not present

## 2016-05-12 DIAGNOSIS — D591 Other autoimmune hemolytic anemias: Secondary | ICD-10-CM | POA: Diagnosis not present

## 2016-05-12 DIAGNOSIS — E119 Type 2 diabetes mellitus without complications: Secondary | ICD-10-CM | POA: Diagnosis not present

## 2016-05-12 DIAGNOSIS — I5032 Chronic diastolic (congestive) heart failure: Secondary | ICD-10-CM | POA: Diagnosis not present

## 2016-05-12 DIAGNOSIS — Z7952 Long term (current) use of systemic steroids: Secondary | ICD-10-CM | POA: Diagnosis not present

## 2016-05-12 DIAGNOSIS — M329 Systemic lupus erythematosus, unspecified: Secondary | ICD-10-CM | POA: Diagnosis not present

## 2016-05-12 DIAGNOSIS — I340 Nonrheumatic mitral (valve) insufficiency: Secondary | ICD-10-CM | POA: Diagnosis not present

## 2016-05-12 DIAGNOSIS — M40205 Unspecified kyphosis, thoracolumbar region: Secondary | ICD-10-CM | POA: Diagnosis not present

## 2016-05-12 DIAGNOSIS — I10 Essential (primary) hypertension: Secondary | ICD-10-CM | POA: Diagnosis not present

## 2016-05-12 DIAGNOSIS — I482 Chronic atrial fibrillation: Secondary | ICD-10-CM | POA: Diagnosis not present

## 2016-05-12 DIAGNOSIS — I2510 Atherosclerotic heart disease of native coronary artery without angina pectoris: Secondary | ICD-10-CM | POA: Diagnosis not present

## 2016-05-12 DIAGNOSIS — E785 Hyperlipidemia, unspecified: Secondary | ICD-10-CM | POA: Diagnosis not present

## 2016-05-12 DIAGNOSIS — I272 Other secondary pulmonary hypertension: Secondary | ICD-10-CM | POA: Diagnosis not present

## 2016-05-17 ENCOUNTER — Telehealth: Payer: Self-pay | Admitting: Cardiovascular Disease

## 2016-05-17 NOTE — Telephone Encounter (Signed)
Ok I understand thanks

## 2016-05-17 NOTE — Telephone Encounter (Signed)
New message   Pt daughter verbalized that she believes her mother has dementia has caused pt believes that Eliquix and Lasix is going to cure her. The Mitro valve procedure on 05/11/16 has not worked it failed.   Pt daughter said that mother physician said that its no harm in letting her believe that they are curing her and pt daughter said she will not be able to take her to the appt and that she is worried that information will not be transferred to Dr. Burt Knack and herself   Please call pt

## 2016-05-17 NOTE — Telephone Encounter (Signed)
I spoke with [name] and she said the Stephanie Horne could not have mitral valve surgery due to scoliosis.  Dr Bridgette Habermann instructed her to to continue current medications. The Stephanie Horne thinks that she has been "fixed" and that she just has to take her medications at this time.  [name] feels like the Stephanie Horne's memory has started to worsen and she feels like it may be best to just let the Stephanie Horne think that she had surgery and that her valve was fixed.  [name] will not be able to attend the Stephanie Horne's appointment on 05/23/16 and she would like Dr Burt Knack to be aware of this information.

## 2016-05-18 ENCOUNTER — Other Ambulatory Visit: Payer: Self-pay | Admitting: Internal Medicine

## 2016-05-20 ENCOUNTER — Encounter: Payer: Self-pay | Admitting: Cardiovascular Disease

## 2016-05-23 ENCOUNTER — Ambulatory Visit (INDEPENDENT_AMBULATORY_CARE_PROVIDER_SITE_OTHER): Payer: Medicare Other | Admitting: Cardiovascular Disease

## 2016-05-23 ENCOUNTER — Encounter: Payer: Self-pay | Admitting: Cardiovascular Disease

## 2016-05-23 VITALS — BP 134/80 | HR 82 | Ht 70.5 in | Wt 174.8 lb

## 2016-05-23 DIAGNOSIS — I340 Nonrheumatic mitral (valve) insufficiency: Secondary | ICD-10-CM | POA: Diagnosis not present

## 2016-05-23 NOTE — Progress Notes (Signed)
Cardiology Office Note Date:  05/23/2016   ID:  Stephanie, Horne [birth year], MRN [id number]  PCP:  [name], MD  Cardiologist:  [name], MD    Chief Complaint  Patient presents with  . Mitral Regurgitation  . Atrial Fibrillation   History of Present Illness: Stephanie Horne is a 75 y.o. female who presents for follow-up evaluation. The patient is been followed for chronic atrial fibrillation, chronic diastolic heart failure, and severe mitral regurgitation. She was noncompliant with warfarin and is now anticoagulated with Eliquis. Over the last 2 years she's developed progressive symptoms of heart failure. She has not required hospitalization, but has been significantly limited. When she was seen in 01/24/2015, she did not wish to pursue any further evaluation for her severe mitral regurgitation. However, at her visit earlier this year, she was willing to consider referral to a tertiary center for consideration of percutaneous mitral valve therapy. She subsequently has undergone TEE, right and left heart catheterization, formal physical therapy evaluation, CT scans of the chest, and formal cardiac surgical evaluation by Dr. Roxy Manns. She was ultimately referred to Dr Bridgette Habermann in [place], [place] for Mitra-Clip but the procedure could not be completed because of anatomic issues with trans-septal puncture related to the patient's severe kyphoscoliosis.   The patient is here alone today. She is doing well. She denies shortness of breath, orthopnea, PND, or chest pain. She denies palpitations, lightheadedness, or syncope. Her chronic leg swelling is unchanged. She skipped Lasix this morning because she was coming into the office.   Past Medical History:  Diagnosis Date  . AIHA (autoimmune hemolytic anemia) ([place])    12/08 onset  . Atrial fibrillation ([place])    not on coumidin because AIHA and major bleeding complications  . Axillary lymphadenopathy    left negative MMG 1/08  . Back pain,  chronic   . Complicated grief [id number]   Death of husband in January 24, 2011   . Diabetes mellitus   . Hyperlipidemia   . Hypertension   . Lupus ([place])    Treated with prednisone indefinitely   . Mitral regurgitation   . Mitral regurgitation 10/24/2006   2d Echo from 2008-01-24 - EF 60%, Moderated MR    . Seasonal allergies     Past Surgical History:  Procedure Laterality Date  . CARDIAC CATHETERIZATION N/A 01/07/2016   Procedure: Right/Left Heart Cath and Coronary Angiography;  Surgeon: [name], MD;  Location: [facility];  Service: Cardiovascular;  Laterality: N/A;  . EYE SURGERY     cataract in both eyes  . TEE WITHOUT CARDIOVERSION N/A 12/09/2015   Procedure: TRANSESOPHAGEAL ECHOCARDIOGRAM (TEE);  Surgeon: [name], MD;  Location: [facility] ENDOSCOPY;  Service: Cardiovascular;  Laterality: N/A;    Current Outpatient Prescriptions  Medication Sig Dispense Refill  . apixaban (ELIQUIS) 5 MG TABS tablet Take 1 tablet (5 mg total) by mouth 2 (two) times daily. 90 tablet 1  . Calcium Carbonate-Vitamin D (CALTRATE 600+D) 600-400 MG-UNIT per tablet Take 1 tablet by mouth 3 (three) times daily with meals. 90 tablet 5  . diclofenac sodium (VOLTAREN) 1 % GEL Apply 1 application topically 2 (two) times daily as needed. For leg, left hip and foot pain 1 Tube 5  . furosemide (LASIX) 40 MG tablet TAKE 1 TABLET (40 MG TOTAL) BY MOUTH DAILY. 90 tablet 1  . glucose blood (ONE TOUCH ULTRA TEST) test strip Use to check blood sugar 1 to 2 times daily. diag code E11.9. Non insulin  dependent 100 each 3  . levocetirizine (XYZAL) 5 MG tablet Take 0.5 tablets (2.5 mg total) by mouth every evening. 90 tablet 0  . losartan (COZAAR) 100 MG tablet Take 1 tablet (100 mg total) by mouth daily. 90 tablet 1  . metoprolol (LOPRESSOR) 50 MG tablet TAKE 1 TABLET (50 MG TOTAL) BY MOUTH 2 (TWO) TIMES DAILY. 180 tablet 1  . nitroGLYCERIN (NITROSTAT) 0.4 MG SL tablet Place 1 tablet (0.4 mg total) under the tongue every 5  (five) minutes as needed for chest pain. 30 tablet 0  . ONETOUCH DELICA LANCETS FINE MISC 1 each by Does not apply route daily. 100 each 5  . Polyethyl Glycol-Propyl Glycol (SYSTANE OP) Place 1 drop into both eyes daily.    . potassium chloride (K-DUR) 10 MEQ tablet Take 1 tablet (10 mEq total) by mouth daily. 30 tablet 3  . pravastatin (PRAVACHOL) 40 MG tablet Take 1 tablet (40 mg total) by mouth daily. 90 tablet 0  . predniSONE (DELTASONE) 10 MG tablet Take 1 tablet (10 mg total) by mouth daily. 90 tablet 1  . traZODone (DESYREL) 50 MG tablet TAKE 1 TABLET (50 MG TOTAL) BY MOUTH AT BEDTIME AS NEEDED FOR SLEEP. 90 tablet 1   No current facility-administered medications for this visit.     Allergies:   Metformin and related; Lisinopril; and Pollen extract   Social History:  The patient  reports that she has quit smoking. She has never used smokeless tobacco. She reports that she does not drink alcohol or use drugs.   Family History:  The patient's  family history includes Heart disease in her mother.    ROS:  Please see the history of present illness.  Otherwise, review of systems is positive for leg weakness.  All other systems are reviewed and negative.    PHYSICAL EXAM: VS:  BP 134/80   Pulse 82   Ht 5' 10.5" (1.791 m)   Wt 79.3 kg (174 lb 12.8 oz)   BMI 24.73 kg/m  , BMI Body mass index is 24.73 kg/m. GEN: Well nourished, well developed, in no acute distress  HEENT: normal  Neck: no JVD, no masses. No carotid bruits Cardiac: irregularly irregular with 3/6 systolic murmur at the apex               Respiratory:  clear to auscultation bilaterally, normal work of breathing GI: soft, nontender, nondistended, + BS MS: no deformity or atrophy  Ext: 1+ bilateral pretibial edema Skin: warm and dry, no rash Neuro:  Strength and sensation are intact Psych: euthymic mood, full affect  EKG:  EKG is not ordered today.  Recent Labs: 12/04/2015: Brain Natriuretic Peptide  235.1 05/04/2016: BUN 12; Creat 0.80; Hemoglobin 12.7; Platelets 139; Potassium 3.4; Sodium 147   Lipid Panel     Component Value Date/Time   CHOL 148 06/04/2014 1418   TRIG 147 06/04/2014 1418   HDL 47 06/04/2014 1418   CHOLHDL 3.1 06/04/2014 1418   VLDL 29 06/04/2014 1418   LDLCALC 72 06/04/2014 1418      Wt Readings from Last 3 Encounters:  05/23/16 79.3 kg (174 lb 12.8 oz)  05/05/16 82.3 kg (181 lb 8 oz)  03/15/16 84.9 kg (187 lb 1.9 oz)     Cardiac Studies Reviewed: TEE 12/09/2015: Study Conclusions  - Left ventricle: The cavity size was normal. Wall thickness was  increased in a pattern of mild LVH. Systolic function was normal.  The estimated ejection fraction was in the range  of 60% to 65%.  Wall motion was normal; there were no regional wall motion  abnormalities. - Aortic valve: There was no stenosis. - Aorta: Normal caliber aorta with mild plaque. - Mitral valve: There was severe mitral regurgitation with two jets  noted, one larger than the other. PISA was done on larger jet,  ERO 0.42 cm^2. There was severe prolapse but not flail of the  A1/A2 segments of the anterior leaflet. This is where the mitral  regurgitation originated. There was systolic flattening of the  pulmonary vein PW doppler tracing. - Left atrium: The atrium was severely dilated. No evidence of  thrombus in the atrial cavity or appendage. - Right ventricle: The cavity size was normal. Systolic function  was normal. - Right atrium: The atrium was mildly dilated. - Atrial septum: No defect or patent foramen ovale was identified.  Echo contrast study showed no right-to-left atrial level shunt,  at baseline or with provocation. - Tricuspid valve: There was moderate regurgitation. Peak RV-RA  gradient (S): 51 mm Hg.  Cardiac Cath:    Conclusion     Mid RCA lesion, 20% stenosed.  Ost LAD to Prox LAD lesion, 25% stenosed.  1. Patent coronary arteries with mild  nonobstructive CAD 2. Normal LV systolic function 3. Severe mitral regurgitation 4. Moderate pulmonary HTN 5. Patent abdominal aorta and iliac arteries (severe scoliosis noted)  Continue evaluation for mitral valve surgery versus Mitra-Clip   CT angio: IMPRESSION: Left and right atrial enlargement with enlargement and left atrial appendage. This is consistent with the given clinical history. Changes of pulmonary arterial hypertension are also noted.  No thoracic aortic aneurysm is noted. No pulmonary emboli are noted.  The emphysematous changes with some stable nodular changes on the right although there are new nodular changes along the major fissure. The largest of these measures 1.3 cm. Some associated smaller nodules are identified varying in size from 5 to 9 mm. Neoplasm deserves consideration and further workup is recommended. PET-CT may be helpful in this regard.  Atherosclerotic changes in the abdominal aorta and iliac vessels without significant stenosis.  Post catheterization hematoma in the right inguinal region.  ASSESSMENT AND PLAN: 1.  Chronic systolic heart failure, NYHA functional class IIb: Patient's symptoms are improved on medical therapy. Mitral clip was unsuccessful because of anatomic issues unrelated to her mitral valve. Will continue current management and arrange follow-up in 3 months.  2. Chronic atrial fibrillation: The patient is tolerating Eliquis without bleeding problems. She was unable to take warfarin. Continue rate control and anticoagulation.  3. Mild nonobstructive CAD: No anginal symptoms.  Current medicines are reviewed with the patient today.  The patient does not have concerns regarding medicines.  Labs/ tests ordered today include:  No orders of the defined types were placed in this encounter.   Disposition:   FU 3 months with APP  Signed, [name], MD  05/23/2016 11:57 AM    [facility] [address] [place], [place], [place]  [address] Phone: [phone]; Fax: [phone]

## 2016-05-23 NOTE — Patient Instructions (Signed)
Medication Instructions:  Your physician recommends that you continue on your current medications as directed. Please refer to the Current Medication list given to you today.  Labwork: No new orders.   Testing/Procedures: No new orders.   Follow-Up: Your physician recommends that you schedule a follow-up appointment in: 3 MONTHS with Richardson Dopp PA-C  Your physician wants you to follow-up in: 6 MONTHS with Dr Burt Knack.  You will receive a reminder letter in the mail two months in advance. If you don't receive a letter, please call our office to schedule the follow-up appointment.   Any Other Special Instructions Will Be Listed Below (If Applicable).     If you need a refill on your cardiac medications before your next appointment, please call your pharmacy.

## 2016-06-07 ENCOUNTER — Ambulatory Visit: Payer: Self-pay | Admitting: Student-PharmD

## 2016-06-07 NOTE — Progress Notes (Unsigned)
Medication Samples have been provided to the patient.  Drug name: ELIQUIS (APIXABAN)       Strength: 5 MG        Qty: 28 tab  LOT: [id number] Exp.Date: SEP 2019  Dosing instructions: TAKE 1 TABLET IN THE MORNING AND 1 TABLET IN THE EVENING.  The patient has been instructed regarding the correct time, dose, and frequency of taking this medication, including desired effects and most common side effects.   Terald Sleeper 4:39 PM 06/07/2016

## 2016-06-22 ENCOUNTER — Telehealth: Payer: Self-pay | Admitting: Pharmacist

## 2016-06-22 ENCOUNTER — Other Ambulatory Visit: Payer: Self-pay | Admitting: Pharmacist

## 2016-06-22 ENCOUNTER — Ambulatory Visit (INDEPENDENT_AMBULATORY_CARE_PROVIDER_SITE_OTHER): Payer: Medicare Other | Admitting: Pharmacist

## 2016-06-22 DIAGNOSIS — Z7189 Other specified counseling: Secondary | ICD-10-CM | POA: Diagnosis not present

## 2016-06-22 DIAGNOSIS — Z7901 Long term (current) use of anticoagulants: Secondary | ICD-10-CM

## 2016-06-22 DIAGNOSIS — I10 Essential (primary) hypertension: Secondary | ICD-10-CM

## 2016-06-22 DIAGNOSIS — E119 Type 2 diabetes mellitus without complications: Secondary | ICD-10-CM

## 2016-06-22 DIAGNOSIS — Z79899 Other long term (current) drug therapy: Secondary | ICD-10-CM | POA: Diagnosis not present

## 2016-06-22 MED ORDER — GLUCOSE BLOOD VI STRP: ORAL_STRIP | 3 refills | Status: DC

## 2016-06-22 MED ORDER — APIXABAN 5 MG PO TABS: 5.0000 mg | ORAL_TABLET | Freq: Two times a day (BID) | ORAL | 1 refills | Status: DC

## 2016-06-22 MED ORDER — PRAVASTATIN SODIUM 40 MG PO TABS: 40.0000 mg | ORAL_TABLET | Freq: Every day | ORAL | 0 refills | Status: DC

## 2016-06-22 NOTE — Progress Notes (Signed)
S: Stephanie Horne is a 75 y.o. female reports to clinical pharmacist appointment for medication management.   Allergies  Allergen Reactions  . Metformin And Related     Diarrhea that was felt to be 2/2 metformin so trial of it was attempted.  . Lisinopril     cough  . Pollen Extract Other (See Comments)    Congestion, runny nose, phlegm   Medication Sig  apixaban (ELIQUIS) 5 MG TABS tablet Take 1 tablet (5 mg total) by mouth 2 (two) times daily.  Calcium Carbonate-Vitamin D (CALTRATE 600+D) 600-400 MG-UNIT per tablet Take 1 tablet by mouth 3 (three) times daily with meals.  diclofenac sodium (VOLTAREN) 1 % GEL Apply 1 application topically 2 (two) times daily as needed. For leg, left hip and foot pain  furosemide (LASIX) 40 MG tablet TAKE 1 TABLET (40 MG TOTAL) BY MOUTH DAILY.  glucose blood (ONE TOUCH ULTRA TEST) test strip Use to check blood sugar 1 to 2 times daily. diag code E11.9. Non insulin dependent  losartan (COZAAR) 100 MG tablet Take 1 tablet (100 mg total) by mouth daily.  metoprolol (LOPRESSOR) 50 MG tablet TAKE 1 TABLET (50 MG TOTAL) BY MOUTH 2 (TWO) TIMES DAILY.  nitroGLYCERIN (NITROSTAT) 0.4 MG SL tablet Place 1 tablet (0.4 mg total) under the tongue every 5 (five) minutes as needed for chest pain.  ONETOUCH DELICA LANCETS FINE MISC 1 each by Does not apply route daily.  Polyethyl Glycol-Propyl Glycol (SYSTANE OP) Place 1 drop into both eyes daily.  potassium chloride (K-DUR) 10 MEQ tablet Take 1 tablet (10 mEq total) by mouth daily.  pravastatin (PRAVACHOL) 40 MG tablet Take 1 tablet (40 mg total) by mouth daily.  predniSONE (DELTASONE) 10 MG tablet Take 1 tablet (10 mg total) by mouth daily.  traZODone (DESYREL) 50 MG tablet TAKE 1 TABLET (50 MG TOTAL) BY MOUTH AT BEDTIME AS NEEDED FOR SLEEP.   Past Medical History:  Diagnosis Date  . AIHA (autoimmune hemolytic anemia) ([place])    12/08 onset  . Atrial fibrillation ([place])    not on coumidin because AIHA and major  bleeding complications  . Axillary lymphadenopathy    left negative MMG 1/08  . Back pain, chronic   . Complicated grief [id number]   Death of husband in Jan 04, 2011   . Diabetes mellitus   . Hyperlipidemia   . Hypertension   . Lupus ([place])    Treated with prednisone indefinitely   . Mitral regurgitation   . Mitral regurgitation 10/24/2006   2d Echo from 04-Jan-2008 - EF 60%, Moderated MR    . Seasonal allergies    Social History   Social History  . Marital status: Married    Spouse name: N/A  . Number of children: N/A  . Years of education: N/A   Social History Main Topics  . Smoking status: Former Research scientist (life sciences)  . Smokeless tobacco: Never Used  . Alcohol use No  . Drug use: No  . Sexual activity: Not on file     Comment: married, retired Animal nutritionist, no smoking, alochol, illegal drug, her husband was clinic patient and died of lung cancer   Other Topics Concern  . Not on file   Social History Narrative  . No narrative on file   Family History  Problem Relation Age of Onset  . Heart disease Mother     O:    Component Value Date/Time   CHOL 148 06/04/2014 1418   HDL 47 06/04/2014 1418   TRIG  147 06/04/2014 1418   AST 19 06/04/2014 1418   ALT 14 06/04/2014 1418   NA 147 (H) 05/04/2016 1340   K 3.4 (L) 05/04/2016 1340   CL 111 (H) 05/04/2016 1340   CO2 24 05/04/2016 1340   GLUCOSE 132 (H) 05/04/2016 1340   HGBA1C 6.4 03/03/2016 1010   HGBA1C 6.6 11/24/2009 1456   BUN 12 05/04/2016 1340   CREATININE 0.80 05/04/2016 1340   CALCIUM 8.8 05/04/2016 1340   GFRAA >60 01/07/2016 0909   GFRAA >89 01/15/2015 1222   WBC 3.7 (L) 05/04/2016 1340   HGB 12.7 05/04/2016 1340   HGB 14.4 10/07/2008 1419   HCT 38.5 05/04/2016 1340   HCT 43.3 10/07/2008 1419   PLT 139 (L) 05/04/2016 1340   PLT 150 10/07/2008 1419   TSH 0.604 06/28/2011 0847   Ht Readings from Last 2 Encounters:  05/23/16 5' 10.5" (1.791 m)  05/05/16 5' 10.5" (1.791 m)   Wt Readings from Last 2 Encounters:  05/23/16  174 lb 12.8 oz (79.3 kg)  05/05/16 181 lb 8 oz (82.3 kg)   There is no height or weight on file to calculate BMI. BP Readings from Last 3 Encounters:  05/23/16 134/80  05/05/16 (!) 166/93  03/15/16 (!) 150/88   A/P:  Patient states she is having trouble affording test strips. Patient had a hyperglycemic event and would like to continue monitoring blood glucose. Advised patient to reduce frequency of glucose monitoring due to A1C being at goal.  Patient states she ran out of apixaban yesterday. [facility] from [facility] to [facility], free trial voucher given to patient.  Medication Samples have been provided to the patient. Drug: apixaban (Eliquis) Strength: 5 mg Qty: 10 boxes LOT: [id number] Exp.Date: 9/19 Dosing instructions: 1 tablet BID The patient has been instructed regarding the correct time, dose, and frequency of taking this medication, including desired effects and most common side effects.     Patient also reports that none of her medications were received in the mail from [facility]. Called pharmacy with patient to arrange shipment. Verified $0 copay on generic agents.  An after visit summary was provided and patient advised to follow up if any changes in condition or questions regarding medications arise.   The patient verbalized understanding of information provided by repeating back concepts discussed.   30 minutes spent face-to-face with the patient during the encounter. 50% of time spent on education. 50% of time was spent on assessment and plan.

## 2016-06-22 NOTE — Telephone Encounter (Signed)
Patient states she needs further help with apixaban access. She states she applied for multiple assistance programs and has been denied due to Medicare coverage but is unable to afford ~$45 copay per month. Yearly income estimated $18,000. Appointment scheduled with patient

## 2016-06-23 ENCOUNTER — Encounter: Payer: Self-pay | Admitting: Pharmacist

## 2016-06-23 NOTE — Patient Instructions (Signed)
Patient educated about medication as defined in this encounter and verbalized understanding by repeating back instructions provided.   

## 2016-07-22 ENCOUNTER — Telehealth: Payer: Self-pay | Admitting: Pharmacist

## 2016-07-27 ENCOUNTER — Ambulatory Visit (INDEPENDENT_AMBULATORY_CARE_PROVIDER_SITE_OTHER): Payer: Medicare Other | Admitting: Pharmacist

## 2016-07-27 DIAGNOSIS — Z888 Allergy status to other drugs, medicaments and biological substances status: Secondary | ICD-10-CM

## 2016-07-27 DIAGNOSIS — Z7901 Long term (current) use of anticoagulants: Secondary | ICD-10-CM

## 2016-07-27 DIAGNOSIS — Z91048 Other nonmedicinal substance allergy status: Secondary | ICD-10-CM

## 2016-07-27 DIAGNOSIS — Z79899 Other long term (current) drug therapy: Secondary | ICD-10-CM

## 2016-07-27 DIAGNOSIS — Z7189 Other specified counseling: Secondary | ICD-10-CM | POA: Diagnosis not present

## 2016-07-27 NOTE — Progress Notes (Signed)
Patient called requesting an appointment for medication review. Appointment scheduled.

## 2016-07-28 ENCOUNTER — Encounter: Payer: Self-pay | Admitting: Pharmacist

## 2016-07-28 NOTE — Progress Notes (Signed)
S: Stephanie Horne is a 75 y.o. female reports to clinical pharmacist appointment for medication review. Patient did not bring medication bottles. Patient is accompanied by a friend, who assist at home with medication management.  Allergies  Allergen Reactions  . Metformin And Related     Diarrhea that was felt to be 2/2 metformin so trial of it was attempted.  . Lisinopril     cough  . Pollen Extract Other (See Comments)    Congestion, runny nose, phlegm   Medication Sig  apixaban (ELIQUIS) 5 MG TABS tablet Take 1 tablet (5 mg total) by mouth 2 (two) times daily.  Calcium Carbonate-Vitamin D (CALTRATE 600+D) 600-400 MG-UNIT per tablet Take 1 tablet by mouth 3 (three) times daily with meals.  diclofenac sodium (VOLTAREN) 1 % GEL Apply 1 application topically 2 (two) times daily as needed. For leg, left hip and foot pain  furosemide (LASIX) 40 MG tablet TAKE 1 TABLET (40 MG TOTAL) BY MOUTH DAILY.  glucose blood (ONE TOUCH ULTRA TEST) test strip Use to check blood sugar 1 to 2 times daily. diag code E11.9. Non insulin dependent  losartan (COZAAR) 100 MG tablet Take 1 tablet (100 mg total) by mouth daily.  metoprolol (LOPRESSOR) 50 MG tablet TAKE 1 TABLET (50 MG TOTAL) BY MOUTH 2 (TWO) TIMES DAILY.  nitroGLYCERIN (NITROSTAT) 0.4 MG SL tablet Place 1 tablet (0.4 mg total) under the tongue every 5 (five) minutes as needed for chest pain.  ONETOUCH DELICA LANCETS FINE MISC 1 each by Does not apply route daily.  Polyethyl Glycol-Propyl Glycol (SYSTANE OP) Place 1 drop into both eyes daily.  potassium chloride (K-DUR) 10 MEQ tablet Take 1 tablet (10 mEq total) by mouth daily.  pravastatin (PRAVACHOL) 40 MG tablet Take 1 tablet (40 mg total) by mouth daily.  predniSONE (DELTASONE) 10 MG tablet Take 1 tablet (10 mg total) by mouth daily.  traZODone (DESYREL) 50 MG tablet TAKE 1 TABLET (50 MG TOTAL) BY MOUTH AT BEDTIME AS NEEDED FOR SLEEP.   Past Medical History:  Diagnosis Date  . AIHA  (autoimmune hemolytic anemia) ([place])    12/08 onset  . Atrial fibrillation ([place])    not on coumidin because AIHA and major bleeding complications  . Axillary lymphadenopathy    left negative MMG 1/08  . Back pain, chronic   . Complicated grief [id number]   Death of husband in 01/25/2011   . Diabetes mellitus   . Hyperlipidemia   . Hypertension   . Lupus    Treated with prednisone indefinitely   . Mitral regurgitation   . Mitral regurgitation 10/24/2006   2d Echo from 01/25/2008 - EF 60%, Moderated MR    . Seasonal allergies    Social History   Social History  . Marital status: Married    Spouse name: N/A  . Number of children: N/A  . Years of education: N/A   Social History Main Topics  . Smoking status: Former Research scientist (life sciences)  . Smokeless tobacco: Never Used  . Alcohol use No  . Drug use: No  . Sexual activity: Not on file     Comment: married, retired Animal nutritionist, no smoking, alochol, illegal drug, her husband was clinic patient and died of lung cancer   Other Topics Concern  . Not on file   Social History Narrative  . No narrative on file   Family History  Problem Relation Age of Onset  . Heart disease Mother    O: Component Value Date/Time  CHOL 148 06/04/2014 1418   HDL 47 06/04/2014 1418   TRIG 147 06/04/2014 1418   AST 19 06/04/2014 1418   ALT 14 06/04/2014 1418   NA 147 (H) 05/04/2016 1340   K 3.4 (L) 05/04/2016 1340   CL 111 (H) 05/04/2016 1340   CO2 24 05/04/2016 1340   GLUCOSE 132 (H) 05/04/2016 1340   HGBA1C 6.4 03/03/2016 1010   HGBA1C 6.6 11/24/2009 1456   BUN 12 05/04/2016 1340   CREATININE 0.80 05/04/2016 1340   CALCIUM 8.8 05/04/2016 1340   GFRAA >60 01/07/2016 0909   GFRAA >89 01/15/2015 1222   WBC 3.7 (L) 05/04/2016 1340   HGB 12.7 05/04/2016 1340   HGB 14.4 10/07/2008 1419   HCT 38.5 05/04/2016 1340   HCT 43.3 10/07/2008 1419   PLT 139 (L) 05/04/2016 1340   PLT 150 10/07/2008 1419   TSH 0.604 06/28/2011 0847   Ht Readings from Last 2  Encounters:  05/23/16 5' 10.5" (1.791 m)  05/05/16 5' 10.5" (1.791 m)   Wt Readings from Last 2 Encounters:  05/23/16 174 lb 12.8 oz (79.3 kg)  05/05/16 181 lb 8 oz (82.3 kg)   There is no height or weight on file to calculate BMI. BP Readings from Last 3 Encounters:  05/23/16 134/80  05/05/16 (!) 166/93  03/15/16 (!) 150/88    A/P: Medications were reviewed with the patient, including name, instructions, indication, goals of therapy, potential side effects, importance of adherence, and safe use. Patient reports confusion about how to order refills. Education provided. The following medications were refilled today due to patient being out: pravastatin, metoprolol, trazodone Pill box provided for patient today Medication Samples have been provided to the patient.  Drug: apixaban (Eliquis) Strength: 5 mg Qty: 4 LOT: [id number] Exp.Date: Sep 2019  Dosing instructions: 1 tablet BID. The patient has been instructed regarding the correct time, dose, and frequency of taking this medication, including desired effects and most common side effects.   BP and A1C at goal, labs WNL, patient reports no symptoms of concern today.  Patient was advised to follow up if any changes in condition or questions regarding medications arise.   The patient and and friend verbalized understanding of information provided by repeating back concepts discussed.  30 minutes spent with patient, 50% on patient education, 50% on assessment, plan, coordination of care

## 2016-07-28 NOTE — Patient Instructions (Signed)
Patient educated about medication as defined in this encounter and verbalized understanding by repeating back instructions provided.   

## 2016-07-30 ENCOUNTER — Other Ambulatory Visit: Payer: Self-pay | Admitting: Internal Medicine

## 2016-08-02 NOTE — Telephone Encounter (Signed)
Levocetirizine (xyzal) is no longer on medication list.  Will forward refill request to pcp for review.Regenia Skeeter, [place] Bartel Cassady10/31/201711:01 AM

## 2016-08-22 NOTE — Progress Notes (Deleted)
Cardiology Office Note:    Date:  08/22/2016   ID:  Stephanie Horne, DOB 13-Oct-1940, MRN [id number]  PCP:  Stephanie Delaine, MD  Cardiologist:  Dr. Sherren Horne   Electrophysiologist:  n/a  Referring MD: Stephanie Raring, MD   No chief complaint on file. ***  History of Present Illness:    Stephanie Horne is a 75 y.o. female with a hx of chronic atrial fibrillation, chronic diastolic heart failure, and severe mitral regurgitation. She was noncompliant with warfarin and is now anticoagulated with Eliquis. Over the last 2 years she's developed progressive symptoms of heart failure. She has not required hospitalization, but has been significantly limited. When she was seen in January 14, 2015, she did not wish to pursue any further evaluation for her severe mitral regurgitation. However, at her visit earlier this year, she was willing to consider referral to a tertiary center for consideration of percutaneous mitral valve therapy. She subsequently has undergone TEE, right and left heart catheterization, formal physical therapy evaluation, CT scans of the chest, and formal cardiac surgical evaluation by Dr. Roxy Horne. She was ultimately referred to Dr Stephanie Horne in [place], [place] for Mitra-Clip but the procedure could not be completed because of anatomic issues with trans-septal puncture related to the patient's severe kyphoscoliosis. Last seen in 8/17.  She returns for follow up.  ***  Prior CV studies that were reviewed today include:    LHC 4/17 RCA mid 20 LAD ostial 25 1. Patent coronary arteries with mild nonobstructive CAD 2. Normal LV systolic function 3. Severe mitral regurgitation 4. Moderate pulmonary HTN 5. Patent abdominal aorta and iliac arteries (severe scoliosis noted)  TEE 3/17 Mild LVH, EF 60-65, no RWMA, severe MR with 2 jets - one larger than the other, severe prolapse of A1/A2 segments of ant leaflet, severe LAE, mild RAE, mod TR, peak RV-RA 51  Echo 10/15 Mild focal basal septal  hypertrophy, EF 60-65, no RWMA, elevated filling pressures, myxomatous anterior MV leaflet with MVP, mod to probably severe MR, severe LAE, mild reduced RVSF, severe RAE, mild to mod TR, PASP 62  Past Medical History:  Diagnosis Date  . AIHA (autoimmune hemolytic anemia) ([place])    12/08 onset  . Atrial fibrillation ([place])    not on coumidin because AIHA and major bleeding complications  . Axillary lymphadenopathy    left negative MMG 1/08  . Back pain, chronic   . Complicated grief [id number]   Death of husband in 01-14-2011   . Diabetes mellitus   . Hyperlipidemia   . Hypertension   . Lupus    Treated with prednisone indefinitely   . Mitral regurgitation   . Mitral regurgitation 10/24/2006   2d Echo from 01/14/08 - EF 60%, Moderated MR    . Seasonal allergies     Past Surgical History:  Procedure Laterality Date  . CARDIAC CATHETERIZATION N/A 01/07/2016   Procedure: Right/Left Heart Cath and Coronary Angiography;  Surgeon: Stephanie Mocha, MD;  Location: [facility];  Service: Cardiovascular;  Laterality: N/A;  . EYE SURGERY     cataract in both eyes  . TEE WITHOUT CARDIOVERSION N/A 12/09/2015   Procedure: TRANSESOPHAGEAL ECHOCARDIOGRAM (TEE);  Surgeon: [name], MD;  Location: [facility]COPY;  Service: Cardiovascular;  Laterality: N/A;    Current Medications: No outpatient prescriptions have been marked as taking for the 08/23/16 encounter (Appointment) with Stephanie Shi, PA-C.     Allergies:   Metformin and related; Lisinopril; and Pollen extract   Social History  Social History  . Marital status: Married    Spouse name: N/A  . Number of children: N/A  . Years of education: N/A   Social History Main Topics  . Smoking status: Former Research scientist (life sciences)  . Smokeless tobacco: Never Used  . Alcohol use No  . Drug use: No  . Sexual activity: Not on file     Comment: married, retired Animal nutritionist, no smoking, alochol, illegal drug, her husband was clinic patient and died of lung  cancer   Other Topics Concern  . Not on file   Social History Narrative  . No narrative on file     Family History:  The patient's ***family history includes Heart disease in her mother.   ROS:   Please see the history of present illness.    ROS All other systems reviewed and are negative.   EKGs/Labs/Other Test Reviewed:    EKG:  EKG is *** ordered today.  The ekg ordered today demonstrates ***  Recent Labs: 12/04/2015: Brain Natriuretic Peptide 235.1 05/04/2016: BUN 12; Creat 0.80; Hemoglobin 12.7; Platelets 139; Potassium 3.4; Sodium 147   Recent Lipid Panel    Component Value Date/Time   CHOL 148 06/04/2014 1418   TRIG 147 06/04/2014 1418   HDL 47 06/04/2014 1418   CHOLHDL 3.1 06/04/2014 1418   VLDL 29 06/04/2014 1418   LDLCALC 72 06/04/2014 1418     Physical Exam:    VS:  There were no vitals taken for this visit.    Wt Readings from Last 3 Encounters:  05/23/16 174 lb 12.8 oz (79.3 kg)  05/05/16 181 lb 8 oz (82.3 kg)  03/15/16 187 lb 1.9 oz (84.9 kg)     ***Physical Exam  ASSESSMENT:    No diagnosis found. PLAN:    In order of problems listed above:  ***   Medication Adjustments/Labs and Tests Ordered: Current medicines are reviewed at length with the patient today.  Concerns regarding medicines are outlined above.  Medication changes, Labs and Tests ordered today are outlined in the Patient Instructions noted below. There are no Patient Instructions on file for this visit. Signed, Stephanie Dopp, PA-C  08/22/2016 10:03 PM    [facility] [address], [place], [place]  [address] Phone: [phone]; Fax: [phone]

## 2016-08-23 ENCOUNTER — Ambulatory Visit: Payer: Self-pay | Admitting: Physician Assistant

## 2016-08-30 ENCOUNTER — Encounter: Payer: Self-pay | Admitting: Physician Assistant

## 2016-09-01 ENCOUNTER — Other Ambulatory Visit: Payer: Self-pay | Admitting: Internal Medicine

## 2016-09-09 NOTE — Telephone Encounter (Signed)
Dr Jamse Arn first appt is March. Insomnia not addressed since Jan 2016 so needs appt not just for insomnia but uncontrolled HTN, controlled DM. Needs appt [facility] next 90 days.

## 2016-09-14 ENCOUNTER — Other Ambulatory Visit: Payer: Self-pay | Admitting: Cardiovascular Disease

## 2016-09-28 ENCOUNTER — Telehealth: Payer: Self-pay

## 2016-09-28 ENCOUNTER — Other Ambulatory Visit: Payer: Self-pay

## 2016-09-28 DIAGNOSIS — I10 Essential (primary) hypertension: Secondary | ICD-10-CM

## 2016-09-28 NOTE — Telephone Encounter (Signed)
Samples of Eliquis 5mg  1 box provided, per request. Patient usually gets them from [facility], but they are not returning the daughter's call.

## 2016-09-28 NOTE — Telephone Encounter (Signed)
apixaban (ELIQUIS) 5 MG TABS tablet, refill request. Please call pt back.

## 2016-10-04 ENCOUNTER — Ambulatory Visit: Payer: Self-pay | Admitting: Pharmacist

## 2016-10-04 NOTE — Progress Notes (Signed)
Patient states she ran out of apixaban (Eliquis), advised [facility] due to possibly lower copay with the new year. Patient verbalized agreement and understanding.  Medication Samples have been provided to the patient.  Drug: apixaban Strength: 5 mg Qty: 56 LOT: [id number] Exp.Date: JAN 2020 Dosing instructions: 1 tablet BID  The patient has been instructed regarding the correct time, dose, and frequency of taking this medication, including desired effects and most common side effects.   Elivia Robotham J 11:12 AM 10/04/2016

## 2016-10-05 MED ORDER — APIXABAN 5 MG PO TABS: 5.0000 mg | ORAL_TABLET | Freq: Two times a day (BID) | ORAL | 1 refills | Status: DC

## 2016-10-14 ENCOUNTER — Telehealth: Payer: Self-pay | Admitting: Internal Medicine

## 2016-10-14 NOTE — Telephone Encounter (Signed)
APT. REMINDER CALL, LMTCB °

## 2016-10-18 ENCOUNTER — Ambulatory Visit (INDEPENDENT_AMBULATORY_CARE_PROVIDER_SITE_OTHER): Payer: Medicare Other | Admitting: Internal Medicine

## 2016-10-18 ENCOUNTER — Encounter: Payer: Self-pay | Admitting: Internal Medicine

## 2016-10-18 VITALS — BP 118/66 | HR 66 | Temp 98.1°F | Wt 166.4 lb

## 2016-10-18 DIAGNOSIS — Z7952 Long term (current) use of systemic steroids: Secondary | ICD-10-CM

## 2016-10-18 DIAGNOSIS — J209 Acute bronchitis, unspecified: Secondary | ICD-10-CM

## 2016-10-18 DIAGNOSIS — Z87891 Personal history of nicotine dependence: Secondary | ICD-10-CM

## 2016-10-18 DIAGNOSIS — R05 Cough: Secondary | ICD-10-CM | POA: Diagnosis not present

## 2016-10-18 DIAGNOSIS — R0982 Postnasal drip: Secondary | ICD-10-CM

## 2016-10-18 DIAGNOSIS — M329 Systemic lupus erythematosus, unspecified: Secondary | ICD-10-CM

## 2016-10-18 DIAGNOSIS — Z23 Encounter for immunization: Secondary | ICD-10-CM

## 2016-10-18 MED ORDER — HYDROCOD POLST-CPM POLST ER 10-8 MG/5ML PO SUER: 5.0000 mL | Freq: Two times a day (BID) | ORAL | Status: DC | PRN

## 2016-10-18 MED ORDER — HYDROCOD POLST-CPM POLST ER 10-8 MG/5ML PO SUER: 5.0000 mL | Freq: Two times a day (BID) | ORAL | 0 refills | Status: DC | PRN

## 2016-10-18 NOTE — Patient Instructions (Signed)
I have prescribed a cough syrup which you can use as needed twice daily. Your symptoms may take several weeks to resolve, but if you notice that you begin to get worse, please call the clinic for another evaluation as this may be a sign that you have contracted a secondary infection.

## 2016-10-18 NOTE — Progress Notes (Signed)
   CC: cough HPI: [name] is a 76 y.o. female with a h/o of SLE on chronic steroids, t2DM, HTN, HLD, chronic AFib on eliquis, severe mitral regurgitation, NYHA class IIb HF who presents URI sx.  Please see Problem-based charting for HPI and the status of patient's chronic medical conditions.  Past Medical History:  Diagnosis Date  . AIHA (autoimmune hemolytic anemia) ([place])    12/08 onset  . Atrial fibrillation ([place])    not on coumidin because AIHA and major bleeding complications  . Axillary lymphadenopathy    left negative MMG 1/08  . Back pain, chronic   . Complicated grief [id number]   Death of husband in 07-Jan-2011   . Diabetes mellitus   . Hyperlipidemia   . Hypertension   . Lupus    Treated with prednisone indefinitely   . Mitral regurgitation   . Mitral regurgitation 10/24/2006   2d Echo from 07-Jan-2008 - EF 60%, Moderated MR    . Seasonal allergies    Social Hx: Social History  Substance Use Topics  . Smoking status: Former Research scientist (life sciences)  . Smokeless tobacco: Never Used  . Alcohol use No   Review of Systems: ROS in HPI. Otherwise: Review of Systems  Constitutional: Negative for chills, fever, malaise/fatigue and weight loss.  HENT: Negative for congestion, ear discharge, ear pain, hearing loss, nosebleeds, sinus pain, sore throat and tinnitus.   Respiratory: Positive for cough. Negative for sputum production, shortness of breath, wheezing and stridor.   Cardiovascular: Negative for chest pain and leg swelling.  Gastrointestinal: Negative for abdominal pain, constipation, diarrhea, nausea and vomiting.  Genitourinary: Negative for dysuria, frequency and urgency.  Musculoskeletal: Negative for myalgias.  Neurological: Negative for dizziness and headaches.    Physical Exam: Vitals:   10/18/16 0938  BP: 118/66  Pulse: 66  Temp: 98.1 F (36.7 C)  TempSrc: Oral  SpO2: 98%  Weight: 166 lb 6.4 oz (75.5 kg)   Physical Exam  Constitutional: She appears well-developed.  She is cooperative. No distress.  Cardiovascular: Normal rate, regular rhythm, normal heart sounds and normal pulses.  Exam reveals no gallop.   No murmur heard. Pulmonary/Chest: Effort normal and breath sounds normal. No respiratory distress. She has no wheezes. She has no rhonchi. She has no rales. Breasts are symmetrical.  Abdominal: Soft. Bowel sounds are normal. There is no tenderness.  Musculoskeletal: She exhibits no edema.    Assessment & Plan:  See encounters tab for problem based medical decision making. Patient discussed with Dr. Eppie Gibson  Acute bronchitis Patient presents with a three-week history of productive cough. She reports that she occasionally gets some clear sputum. She denies any upper respiratory congestion or sinus pain. She does note mild amount of postnasal drip. She reports no shortness of breath, no chest pain, no fevers or chills, no headaches. She denies any recent sick contacts. She has no myalgias.  On exam patient's lungs are clear to auscultation, she has no lymphadenopathy, she has no tenderness to palpation over the maxillary or frontal sinuses.  I feel the patient's symptoms are most consistent with a viral bronchitis. She has tried Robitussin without significant relief. Patient is on chronic oral prednisone for lupus. At this point I do not see any warning signs of bacterial infection.  Plan: Tussionex for symptomatically management of cough. Anticipatory guidance given for concern of secondary infection.   Signed: Holley Raring, MD 10/18/2016, 10:04 AM  Pager: [phone]

## 2016-10-18 NOTE — Progress Notes (Signed)
Case discussed with Dr. Gay Filler at the time of the visit. We reviewed the resident's history and exam and pertinent patient test results. I agree with the assessment, diagnosis, and plan of care documented in the resident's note.  She now technically has a sub-acute cough.  The cause of acute/subacute coughs are generally not subtle.  In her case an obvious cause is unclear as her cardiac history does not seem to be playing a role her per examination.  If this is not a viral bronchitis then keep in the differential an upper airway cough syndrome related tot he recent discontinuation of the nasal steroids.  An alternative would be to add a first generation antihistamine for the anticholinergic properties if this was felt to be necessary where the symptoms to persist.

## 2016-10-18 NOTE — Assessment & Plan Note (Signed)
Patient presents with a three-week history of productive cough. She reports that she occasionally gets some clear sputum. She denies any upper respiratory congestion or sinus pain. She does note mild amount of postnasal drip. She reports no shortness of breath, no chest pain, no fevers or chills, no headaches. She denies any recent sick contacts. She has no myalgias.  On exam patient's lungs are clear to auscultation, she has no lymphadenopathy, she has no tenderness to palpation over the maxillary or frontal sinuses.  I feel the patient's symptoms are most consistent with a viral bronchitis. She has tried Robitussin without significant relief. Patient is on chronic oral prednisone for lupus. At this point I do not see any warning signs of bacterial infection.  Plan: Tussionex for symptomatically management of cough. Anticipatory guidance given for concern of secondary infection.

## 2016-10-31 ENCOUNTER — Telehealth: Payer: Self-pay

## 2016-10-31 ENCOUNTER — Telehealth: Payer: Self-pay | Admitting: Cardiovascular Disease

## 2016-10-31 NOTE — Telephone Encounter (Signed)
New Message  Pt c/o medication issue:  1. Name of Medication: chlorpheniramine-Hydrocodone (Tussion-EX Pennikinetic ER) 10-8 mg/19ml suer  2. How are you currently taking this medication (dosage and times per day)? Take 5 ml every 12 hrs  3. Are you having a reaction (difficulty breathing--STAT)? N/A  4. What is your medication issue? Pt voiced has bad cough with runny nose.   Went to [facility] and prescribed cough syrup and pt will not take it.  Pt voiced since pt wouldn't take it, her cough is getting worse.  Pts daughter voiced wanting to know what she should do.  Please f/u

## 2016-10-31 NOTE — Telephone Encounter (Signed)
I spoke with Stephanie Horne and advised her that she needs to touch base with the pt's PCP to make them aware that the pt's symptoms have worsened and she does not want to take cough syrup.  The pt is also due to schedule follow-up in February with Dr Burt Knack and appointment has been arranged. Stephanie Horne agreed to call PCP.

## 2016-10-31 NOTE — Telephone Encounter (Signed)
Needs to speak with a nurse regarding apixaban (ELIQUIS) 5 MG TABS tablet. Please call back

## 2016-11-01 NOTE — Telephone Encounter (Signed)
Called back, transfer to dr Maudie Mercury

## 2016-11-02 ENCOUNTER — Ambulatory Visit (INDEPENDENT_AMBULATORY_CARE_PROVIDER_SITE_OTHER): Payer: Medicare Other | Admitting: Pharmacist

## 2016-11-02 ENCOUNTER — Other Ambulatory Visit: Payer: Self-pay | Admitting: Pharmacist

## 2016-11-02 DIAGNOSIS — Z7901 Long term (current) use of anticoagulants: Secondary | ICD-10-CM

## 2016-11-02 DIAGNOSIS — Z7952 Long term (current) use of systemic steroids: Secondary | ICD-10-CM | POA: Diagnosis not present

## 2016-11-02 DIAGNOSIS — Z87891 Personal history of nicotine dependence: Secondary | ICD-10-CM

## 2016-11-02 DIAGNOSIS — Z7189 Other specified counseling: Secondary | ICD-10-CM

## 2016-11-02 DIAGNOSIS — Z91048 Other nonmedicinal substance allergy status: Secondary | ICD-10-CM

## 2016-11-02 DIAGNOSIS — Z8249 Family history of ischemic heart disease and other diseases of the circulatory system: Secondary | ICD-10-CM

## 2016-11-02 DIAGNOSIS — Z79899 Other long term (current) drug therapy: Secondary | ICD-10-CM

## 2016-11-02 DIAGNOSIS — I10 Essential (primary) hypertension: Secondary | ICD-10-CM

## 2016-11-02 DIAGNOSIS — Z888 Allergy status to other drugs, medicaments and biological substances status: Secondary | ICD-10-CM

## 2016-11-02 MED ORDER — LEVOCETIRIZINE DIHYDROCHLORIDE 5 MG PO TABS: ORAL_TABLET | ORAL | 1 refills | Status: DC

## 2016-11-02 MED ORDER — LOSARTAN POTASSIUM 100 MG PO TABS: 100.0000 mg | ORAL_TABLET | Freq: Every day | ORAL | 1 refills | Status: DC

## 2016-11-02 MED ORDER — PRAVASTATIN SODIUM 40 MG PO TABS: 40.0000 mg | ORAL_TABLET | Freq: Every day | ORAL | 0 refills | Status: DC

## 2016-11-02 MED ORDER — POTASSIUM CHLORIDE ER 10 MEQ PO TBCR: 10.0000 meq | EXTENDED_RELEASE_TABLET | Freq: Every day | ORAL | 3 refills | Status: DC

## 2016-11-02 NOTE — Progress Notes (Signed)
S: Stephanie Horne is a 76 y.o. female reports to clinical pharmacist appointment for medication management. Patient did  bring medication bottles. Patient is accompanied by loved one, who assist at home with medication management.  Allergies  Allergen Reactions  . Metformin And Related     Diarrhea that was felt to be 2/2 metformin so trial of it was attempted.  . Lisinopril     cough  . Pollen Extract Other (See Comments)    Congestion, runny nose, phlegm   Medication Sig  apixaban (ELIQUIS) 5 MG TABS tablet Take 1 tablet (5 mg total) by mouth 2 (two) times daily.  Calcium Carbonate-Vitamin D (CALTRATE 600+D) 600-400 MG-UNIT per tablet Take 1 tablet by mouth 3 (three) times daily with meals.  diclofenac sodium (VOLTAREN) 1 % GEL Apply 1 application topically 2 (two) times daily as needed. For leg, left hip and foot pain  furosemide (LASIX) 40 MG tablet TAKE 1 TABLET (40 MG TOTAL) BY MOUTH DAILY.  glucose blood (ONE TOUCH ULTRA TEST) test strip Use to check blood sugar 1 to 2 times daily. diag code E11.9. Non insulin dependent  levocetirizine (XYZAL) 5 MG tablet TAKE 0.5 TABLETS (2.5 MG TOTAL) BY MOUTH EVERY EVENING.  losartan (COZAAR) 100 MG tablet Take 1 tablet (100 mg total) by mouth daily.  metoprolol (LOPRESSOR) 50 MG tablet TAKE 1 TABLET (50 MG TOTAL) BY MOUTH 2 (TWO) TIMES DAILY.  nitroGLYCERIN (NITROSTAT) 0.4 MG SL tablet Place 1 tablet (0.4 mg total) under the tongue every 5 (five) minutes as needed for chest pain.  ONETOUCH DELICA LANCETS FINE MISC 1 each by Does not apply route daily.  Polyethyl Glycol-Propyl Glycol (SYSTANE OP) Place 1 drop into both eyes daily.  potassium chloride (K-DUR,KLOR-CON) 10 MEQ tablet TAKE 1 TABLET BY MOUTH  DAILY  pravastatin (PRAVACHOL) 40 MG tablet Take 1 tablet (40 mg total) by mouth daily.  predniSONE (DELTASONE) 10 MG tablet Take 1 tablet (10 mg total) by mouth daily.  traZODone (DESYREL) 50 MG tablet TAKE 1 TABLET (50 MG TOTAL) BY MOUTH AT  BEDTIME AS NEEDED FOR SLEEP.   Past Medical History:  Diagnosis Date  . AIHA (autoimmune hemolytic anemia) ([place])    12/08 onset  . Atrial fibrillation ([place])    not on coumidin because AIHA and major bleeding complications  . Axillary lymphadenopathy    left negative MMG 1/08  . Back pain, chronic   . Complicated grief [id number]   Death of husband in 01/22/2011   . Diabetes mellitus   . Hyperlipidemia   . Hypertension   . Lupus    Treated with prednisone indefinitely   . Mitral regurgitation   . Mitral regurgitation 10/24/2006   2d Echo from 22-Jan-2008 - EF 60%, Moderated MR    . Seasonal allergies    Social History   Social History  . Marital status: Married    Spouse name: N/A  . Number of children: N/A  . Years of education: N/A   Social History Main Topics  . Smoking status: Former Research scientist (life sciences)  . Smokeless tobacco: Never Used  . Alcohol use No  . Drug use: No  . Sexual activity: Not on file     Comment: married, retired Animal nutritionist, no smoking, alochol, illegal drug, her husband was clinic patient and died of lung cancer   Other Topics Concern  . Not on file   Social History Narrative  . No narrative on file   Family History  Problem Relation Age of Onset  .  Heart disease Mother    O: Component Value Date/Time   CHOL 148 06/04/2014 1418   HDL 47 06/04/2014 1418   TRIG 147 06/04/2014 1418   AST 19 06/04/2014 1418   ALT 14 06/04/2014 1418   NA 147 (H) 05/04/2016 1340   K 3.4 (L) 05/04/2016 1340   CL 111 (H) 05/04/2016 1340   CO2 24 05/04/2016 1340   GLUCOSE 132 (H) 05/04/2016 1340   HGBA1C 6.4 03/03/2016 1010   HGBA1C 6.6 11/24/2009 1456   BUN 12 05/04/2016 1340   CREATININE 0.80 05/04/2016 1340   CALCIUM 8.8 05/04/2016 1340   GFRAA >60 01/07/2016 0909   GFRAA >89 01/15/2015 1222   WBC 3.7 (L) 05/04/2016 1340   HGB 12.7 05/04/2016 1340   HGB 14.4 10/07/2008 1419   HCT 38.5 05/04/2016 1340   HCT 43.3 10/07/2008 1419   PLT 139 (L) 05/04/2016 1340   PLT 150  10/07/2008 1419   TSH 0.604 06/28/2011 0847   Ht Readings from Last 2 Encounters:  05/23/16 5' 10.5" (1.791 m)  05/05/16 5' 10.5" (1.791 m)   Wt Readings from Last 2 Encounters:  10/18/16 166 lb 6.4 oz (75.5 kg)  05/23/16 174 lb 12.8 oz (79.3 kg)   There is no height or weight on file to calculate BMI. BP Readings from Last 3 Encounters:  10/18/16 118/66  05/23/16 134/80  05/05/16 (!) 166/93   A/P:  Patient presented with empty bottles of pravastatin, losartan, potassium, and levocetirizine. Refill requests processed.  Patient also presented with a bottle of cough medicine for clinic disposal. She states her cough has almost resolved and she is feeling much better. Disposed of hydrocodone-chlorpheniramine with Richard Miu, pharmacy student. Medication Samples have been provided to the patient.  Drug: apixaban (Eliquis) Strength: 2.5 mg Qty: 10 [id number] Exp.Date: 5/18  Dosing instructions: 2 tablets BID  The patient has been instructed regarding the correct time, dose, and frequency of taking this medication, including desired effects and most common side effects.   An after visit summary was provided and patient advised to follow up if any changes in condition or questions regarding medications arise.   The patient verbalized understanding of information provided by repeating back concepts discussed.

## 2016-11-14 ENCOUNTER — Encounter: Payer: Self-pay | Admitting: Cardiovascular Disease

## 2016-11-21 ENCOUNTER — Ambulatory Visit (INDEPENDENT_AMBULATORY_CARE_PROVIDER_SITE_OTHER): Payer: Medicare Other | Admitting: Cardiovascular Disease

## 2016-11-21 ENCOUNTER — Encounter: Payer: Self-pay | Admitting: Cardiovascular Disease

## 2016-11-21 ENCOUNTER — Encounter (INDEPENDENT_AMBULATORY_CARE_PROVIDER_SITE_OTHER): Payer: Self-pay

## 2016-11-21 VITALS — BP 122/80 | HR 64 | Ht 70.5 in | Wt 179.4 lb

## 2016-11-21 DIAGNOSIS — R918 Other nonspecific abnormal finding of lung field: Secondary | ICD-10-CM

## 2016-11-21 DIAGNOSIS — I059 Rheumatic mitral valve disease, unspecified: Secondary | ICD-10-CM | POA: Diagnosis not present

## 2016-11-21 DIAGNOSIS — I4821 Permanent atrial fibrillation: Secondary | ICD-10-CM

## 2016-11-21 DIAGNOSIS — I482 Chronic atrial fibrillation: Secondary | ICD-10-CM | POA: Diagnosis not present

## 2016-11-21 NOTE — Progress Notes (Signed)
Cardiology Office Note Date:  11/24/2016   ID:  Stephanie, Horne March 04, 1941, MRN [id number]  PCP:  [name], MD  Cardiologist:  [name], MD    Chief Complaint  Patient presents with  . Shortness of Breath   History of Present Illness: Stephanie Horne is a 76 y.o. female who presents for follow-up of mitral valve disease and permanent atrial fibrillation. She is here with a friend today.   She is no longer active, but has no specific complaints. No problems with sleep. Today, she denies symptoms of palpitations, chest pain, shortness of breath, orthopnea, PND, lower extremity edema, dizziness, or syncope.  Past Medical History:  Diagnosis Date  . AIHA (autoimmune hemolytic anemia) ([place])    12/08 onset  . Atrial fibrillation ([place])    not on coumidin because AIHA and major bleeding complications  . Axillary lymphadenopathy    left negative MMG 1/08  . Back pain, chronic   . Complicated grief [id number]   Death of husband in 2011/01/10   . Diabetes mellitus   . Hyperlipidemia   . Hypertension   . Lupus    Treated with prednisone indefinitely   . Mitral regurgitation   . Mitral regurgitation 10/24/2006   2d Echo from 2008/01/10 - EF 60%, Moderated MR    . Seasonal allergies     Past Surgical History:  Procedure Laterality Date  . CARDIAC CATHETERIZATION N/A 01/07/2016   Procedure: Right/Left Heart Cath and Coronary Angiography;  Surgeon: [name], MD;  Location: [facility];  Service: Cardiovascular;  Laterality: N/A;  . EYE SURGERY     cataract in both eyes  . TEE WITHOUT CARDIOVERSION N/A 12/09/2015   Procedure: TRANSESOPHAGEAL ECHOCARDIOGRAM (TEE);  Surgeon: [name], MD;  Location: [facility] ENDOSCOPY;  Service: Cardiovascular;  Laterality: N/A;    Current Outpatient Prescriptions  Medication Sig Dispense Refill  . apixaban (ELIQUIS) 5 MG TABS tablet Take 1 tablet (5 mg total) by mouth 2 (two) times daily. 180 tablet 1  . Calcium Carbonate-Vitamin D  (CALTRATE 600+D) 600-400 MG-UNIT per tablet Take 1 tablet by mouth 3 (three) times daily with meals. 90 tablet 5  . diclofenac sodium (VOLTAREN) 1 % GEL Apply 1 application topically 2 (two) times daily as needed. For leg, left hip and foot pain 1 Tube 5  . furosemide (LASIX) 40 MG tablet TAKE 1 TABLET (40 MG TOTAL) BY MOUTH DAILY. 90 tablet 1  . glucose blood (ONE TOUCH ULTRA TEST) test strip Use to check blood sugar 1 to 2 times daily. diag code E11.9. Non insulin dependent 100 each 3  . levocetirizine (XYZAL) 5 MG tablet TAKE 0.5 TABLETS (2.5 MG TOTAL) BY MOUTH EVERY EVENING. 30 tablet 1  . losartan (COZAAR) 100 MG tablet Take 1 tablet (100 mg total) by mouth daily. 90 tablet 1  . metoprolol (LOPRESSOR) 50 MG tablet TAKE 1 TABLET (50 MG TOTAL) BY MOUTH 2 (TWO) TIMES DAILY. 180 tablet 1  . nitroGLYCERIN (NITROSTAT) 0.4 MG SL tablet Place 1 tablet (0.4 mg total) under the tongue every 5 (five) minutes as needed for chest pain. 30 tablet 0  . ONETOUCH DELICA LANCETS FINE MISC 1 each by Does not apply route daily. 100 each 5  . Polyethyl Glycol-Propyl Glycol (SYSTANE OP) Place 1 drop into both eyes daily.    . potassium chloride (K-DUR) 10 MEQ tablet Take 1 tablet (10 mEq total) by mouth daily. 30 tablet 3  . potassium chloride (K-DUR,KLOR-CON) 10 MEQ  tablet TAKE 1 TABLET BY MOUTH  DAILY 90 tablet 2  . pravastatin (PRAVACHOL) 40 MG tablet Take 1 tablet (40 mg total) by mouth daily. 90 tablet 0  . predniSONE (DELTASONE) 10 MG tablet Take 1 tablet (10 mg total) by mouth daily. 90 tablet 1  . traZODone (DESYREL) 50 MG tablet TAKE 1 TABLET (50 MG TOTAL) BY MOUTH AT BEDTIME AS NEEDED FOR SLEEP. 90 tablet 0   No current facility-administered medications for this visit.     Allergies:   Metformin and related; Lisinopril; and Pollen extract   Social History:  The patient  reports that she has quit smoking. She has never used smokeless tobacco. She reports that she does not drink alcohol or use drugs.    Family History:  The patient's  family history includes Heart disease in her mother.    ROS:  Please see the history of present illness.  Otherwise, review of systems is positive for cough, muscle aches.  All other systems are reviewed and negative.    PHYSICAL EXAM: VS:  BP 122/80   Pulse 64   Ht 5' 10.5" (1.791 m)   Wt 179 lb 6.4 oz (81.4 kg)   BMI 25.38 kg/m  , BMI Body mass index is 25.38 kg/m. GEN: Well nourished, well developed, in no acute distress  HEENT: normal  Neck: no JVD, no masses. No carotid bruits Cardiac: irregular with 3/6 holosystolic murmur at the apex, no diastolic murmur               Respiratory:  clear to auscultation bilaterally, normal work of breathing GI: soft, nontender, nondistended, + BS MS: no deformity or atrophy  Ext: trace bilateral pretibial edema, pedal pulses 2+= bilaterally Skin: warm and dry, no rash Neuro:  Strength and sensation are intact Psych: euthymic mood, full affect  EKG:  EKG is ordered today. The ekg ordered today shows atrial fibrillation 64 bpm, PVC, possible age-indeterminate septal infarct.   Recent Labs: 12/04/2015: Brain Natriuretic Peptide 235.1 05/04/2016: BUN 12; Creat 0.80; Hemoglobin 12.7; Platelets 139; Potassium 3.4; Sodium 147   Lipid Panel     Component Value Date/Time   CHOL 148 06/04/2014 1418   TRIG 147 06/04/2014 1418   HDL 47 06/04/2014 1418   CHOLHDL 3.1 06/04/2014 1418   VLDL 29 06/04/2014 1418   LDLCALC 72 06/04/2014 1418      Wt Readings from Last 3 Encounters:  11/21/16 179 lb 6.4 oz (81.4 kg)  10/18/16 166 lb 6.4 oz (75.5 kg)  05/23/16 174 lb 12.8 oz (79.3 kg)     Cardiac Studies Reviewed: Cardiac Cath 01/07/2016: Conclusion    Mid RCA lesion, 20% stenosed.  Ost LAD to Prox LAD lesion, 25% stenosed.   1. Patent coronary arteries with mild nonobstructive CAD 2. Normal LV systolic function 3. Severe mitral regurgitation 4. Moderate pulmonary HTN 5. Patent abdominal aorta and iliac  arteries (severe scoliosis noted)  Continue evaluation for mitral valve surgery versus Mitra-Clip   TEE 12/09/2015: Study Conclusions  - Left ventricle: The cavity size was normal. Wall thickness was   increased in a pattern of mild LVH. Systolic function was normal.   The estimated ejection fraction was in the range of 60% to 65%.   Wall motion was normal; there were no regional wall motion   abnormalities. - Aortic valve: There was no stenosis. - Aorta: Normal caliber aorta with mild plaque. - Mitral valve: There was severe mitral regurgitation with two jets   noted, one  larger than the other. PISA was done on larger jet,   ERO 0.42 cm^2. There was severe prolapse but not flail of the   A1/A2 segments of the anterior leaflet. This is where the mitral   regurgitation originated. There was systolic flattening of the   pulmonary vein PW doppler tracing. - Left atrium: The atrium was severely dilated. No evidence of   thrombus in the atrial cavity or appendage. - Right ventricle: The cavity size was normal. Systolic function   was normal. - Right atrium: The atrium was mildly dilated. - Atrial septum: No defect or patent foramen ovale was identified.   Echo contrast study showed no right-to-left atrial level shunt,   at baseline or with provocation. - Tricuspid valve: There was moderate regurgitation. Peak RV-RA   gradient (S): 51 mm Hg.  ASSESSMENT AND PLAN: 1.  Severe mitral regurgitation: Have attempted mitral clip but this was technically unsuccessful. The patient will continue with medical management. She is not a surgical candidate.  2. Permanent atrial fibrillation: Anticoagulated with Apixaban. Even now she likely has valvular atrial fibrillation, she was never able to be compliant with warfarin. She is at high stroke risk and it is appropriate for her to be anticoagulated. She has not had any bleeding problems.  3. Abnormal CT scan: the patient had an abnormal CT with  nodular lung densities identified. A PET-CT scan was recommended. Discussed with patient - this test will be ordered.   Current medicines are reviewed with the patient today.  The patient does not have concerns regarding medicines.  Labs/ tests ordered today include:   Orders Placed This Encounter  Procedures  . NM PET Image Initial (PI) Skull Base To Thigh  . EKG 12-Lead    Disposition:   FU 6 months  Signed, [name], MD  11/24/2016 12:04 PM    [facility] [address], [place], [place]  [address] Phone: [phone]; Fax: [phone]

## 2016-11-21 NOTE — Patient Instructions (Signed)
Medication Instructions:  Your physician recommends that you continue on your current medications as directed. Please refer to the Current Medication list given to you today.  Labwork: No new orders.   Testing/Procedures: Your physician has recommended a PET scan.   Follow-Up: Your physician wants you to follow-up in: 6 MONTHS with Dr Burt Knack.  You will receive a reminder letter in the mail two months in advance. If you don't receive a letter, please call our office to schedule the follow-up appointment.  Any Other Special Instructions Will Be Listed Below (If Applicable).     If you need a refill on your cardiac medications before your next appointment, please call your pharmacy.

## 2016-11-22 ENCOUNTER — Telehealth: Payer: Self-pay | Admitting: Cardiovascular Disease

## 2016-11-22 NOTE — Telephone Encounter (Signed)
New Message     Returning call about test results done and scheduling PET test

## 2016-11-22 NOTE — Telephone Encounter (Signed)
I spoke with the pt and she states she was called by the hospital today but was confused about the appointment and the person from the hospital told the pt that she would contact our office.  I advised the pt that at this time I have not received a phone call and the pt cannot give me the number that she called.  I told her that the hospital should be trying to reach her in regards to scheduling PET scan.  The pt will await a return call from the hospital.

## 2016-11-29 ENCOUNTER — Other Ambulatory Visit: Payer: Self-pay | Admitting: Internal Medicine

## 2016-12-08 ENCOUNTER — Encounter: Payer: Self-pay | Admitting: Internal Medicine

## 2016-12-08 ENCOUNTER — Ambulatory Visit (INDEPENDENT_AMBULATORY_CARE_PROVIDER_SITE_OTHER): Payer: Medicare Other | Admitting: Internal Medicine

## 2016-12-08 VITALS — BP 126/45 | HR 68 | Temp 97.7°F | Wt 177.4 lb

## 2016-12-08 DIAGNOSIS — E785 Hyperlipidemia, unspecified: Secondary | ICD-10-CM | POA: Diagnosis not present

## 2016-12-08 DIAGNOSIS — E119 Type 2 diabetes mellitus without complications: Secondary | ICD-10-CM

## 2016-12-08 DIAGNOSIS — E538 Deficiency of other specified B group vitamins: Secondary | ICD-10-CM

## 2016-12-08 DIAGNOSIS — I10 Essential (primary) hypertension: Secondary | ICD-10-CM

## 2016-12-08 DIAGNOSIS — Z23 Encounter for immunization: Secondary | ICD-10-CM | POA: Diagnosis not present

## 2016-12-08 DIAGNOSIS — Z87891 Personal history of nicotine dependence: Secondary | ICD-10-CM | POA: Diagnosis not present

## 2016-12-08 DIAGNOSIS — Z7952 Long term (current) use of systemic steroids: Secondary | ICD-10-CM | POA: Diagnosis not present

## 2016-12-08 DIAGNOSIS — Z79899 Other long term (current) drug therapy: Secondary | ICD-10-CM

## 2016-12-08 DIAGNOSIS — I482 Chronic atrial fibrillation: Secondary | ICD-10-CM | POA: Diagnosis not present

## 2016-12-08 DIAGNOSIS — Z7901 Long term (current) use of anticoagulants: Secondary | ICD-10-CM

## 2016-12-08 DIAGNOSIS — I4821 Permanent atrial fibrillation: Secondary | ICD-10-CM

## 2016-12-08 DIAGNOSIS — M818 Other osteoporosis without current pathological fracture: Secondary | ICD-10-CM

## 2016-12-08 DIAGNOSIS — I340 Nonrheumatic mitral (valve) insufficiency: Secondary | ICD-10-CM

## 2016-12-08 DIAGNOSIS — M329 Systemic lupus erythematosus, unspecified: Secondary | ICD-10-CM | POA: Diagnosis not present

## 2016-12-08 DIAGNOSIS — M810 Age-related osteoporosis without current pathological fracture: Secondary | ICD-10-CM | POA: Diagnosis not present

## 2016-12-08 DIAGNOSIS — E118 Type 2 diabetes mellitus with unspecified complications: Secondary | ICD-10-CM

## 2016-12-08 DIAGNOSIS — Z8249 Family history of ischemic heart disease and other diseases of the circulatory system: Secondary | ICD-10-CM

## 2016-12-08 LAB — POCT GLYCOSYLATED HEMOGLOBIN (HGB A1C): Hemoglobin A1C: 6.5

## 2016-12-08 LAB — GLUCOSE, CAPILLARY: Glucose-Capillary: 153 mg/dL — ABNORMAL HIGH (ref 65–99)

## 2016-12-08 MED ORDER — METOPROLOL TARTRATE 50 MG PO TABS: ORAL_TABLET | ORAL | 1 refills | Status: DC

## 2016-12-08 MED ORDER — PREDNISONE 10 MG PO TABS: 10.0000 mg | ORAL_TABLET | Freq: Every day | ORAL | 1 refills | Status: DC

## 2016-12-08 MED ORDER — LEVOCETIRIZINE DIHYDROCHLORIDE 5 MG PO TABS: ORAL_TABLET | ORAL | 1 refills | Status: DC

## 2016-12-08 MED ORDER — OMEPRAZOLE 20 MG PO CPDR: 20.0000 mg | DELAYED_RELEASE_CAPSULE | Freq: Two times a day (BID) | ORAL | 1 refills | Status: DC

## 2016-12-08 MED ORDER — APIXABAN 5 MG PO TABS
5.0000 mg | ORAL_TABLET | Freq: Two times a day (BID) | ORAL | 1 refills | Status: DC
Start: 2016-12-08 — End: 2017-04-06

## 2016-12-08 MED ORDER — APIXABAN 5 MG PO TABS: 5.0000 mg | ORAL_TABLET | Freq: Two times a day (BID) | ORAL | 1 refills | Status: DC

## 2016-12-08 NOTE — Patient Instructions (Signed)
Your blood sugar and blood pressure is excellent. Keep up the good work.  We will start a new medication for your upper airwair cough and irritation called omeprazole.  I will follow up on your CT scan results.

## 2016-12-08 NOTE — Progress Notes (Signed)
Case discussed with Dr. Gay Filler at the time of the visit.  We reviewed the resident's history and exam and pertinent patient test results.  I agree with the assessment, diagnosis and plan of care documented in the resident's note.

## 2016-12-08 NOTE — Assessment & Plan Note (Signed)
Patient is a history of vitamin D deficiency and osteoporosis on DEXA scan in 2014. She is currently on chronic glucocorticoid therapy which will likely exacerbate this. Patient has not been on bisphosphonates for several years.  Patient has had one fall over the last year which think we do not result in fracture. Patient is at high fracture risk given her osteoporosis. Would like to reevaluate with DEXA scan and plan for reinitiation of bisphosphonate therapy if appropriate.  Assessment: Osteoporosis, currently on bisphosphonate holiday Vitamin D deficiency  Plan: DEXA scan, plan for bisphosphonate therapy as appropriate

## 2016-12-08 NOTE — Assessment & Plan Note (Signed)
Blood pressure currently well controlled on metoprolol and losartan. Patient denies any headache, chest pain, short of breath. Blood pressure today is 126/45.  Assessment: Well-controlled hypertension  Plan: Continue current therapy

## 2016-12-08 NOTE — Assessment & Plan Note (Addendum)
Patient has a history of diabetes but is currently diet controlled. She is on chronic steroids prednisone 10 mg. Her last random blood glucose was 132 in August 2017. In spite of her glucocorticoid therapy she seems to be maintaining her blood sugar without medication. This has been a question her diagnosis of diabetes. Upon chart review, she does have a hemoglobin A1c ranging from 6.4-7.2 over the last several years.  Patient's point-of-care hemoglobin A1c today is 6.5  Patient denies any polydipsia or polyuria.  Assessment: Well controlled diabetes mellitus  Plan: Continue diet modification

## 2016-12-08 NOTE — Assessment & Plan Note (Addendum)
Patient is a history of atrial fibrillation which bleed permanent secondary valvular dysfunction. She is followed by cardiology. Currently on rate control with metoprolol and anticoagulation with Elequis 5 mg twice a day. Patient has had no signs or symptoms of bleeding. She denies any symptoms of palpitation, shortness of breath, lightheaded or dizziness.  Assessment: Permanent atrial fibrillation secondary valvular dysfunction  Plan: Continue current therapy Sample of Eliquis 5 mg given today to hold her over for refill of Elequis is from her mail-in pharmacy.

## 2016-12-08 NOTE — Assessment & Plan Note (Signed)
Patient is followed by cardiology for severe mitral regurgitation. Cardiology feels that her atrial fibrillation is permanent likely related to this evaluated dysfunction. She was evaluated for surgical management but was not a good candidate. She is currently not symptomatically with no dyspnea or shortness of breath. Patient is not very active and lives a sedentary lifestyle currently.  Assessment: Stable mitral regurg, not a surgical candidate  Plan: Continue furosemide and potassium

## 2016-12-08 NOTE — Progress Notes (Signed)
CC: routine DM and HTN f/u HPI: [name] is a 76 y.o. female with SLE on chronic steroids, t2DM, HTN, HLD, chronic AFib on eliquis, severe mitral regurgitation, NYHA class IIb HF who presents for routine f/u of her chronic conditions above  Please see Problem-based charting for HPI and the status of patient's chronic medical conditions.  Past Medical History:  Diagnosis Date  . AIHA (autoimmune hemolytic anemia) ([place])    12/08 onset  . Atrial fibrillation ([place])    not on coumidin because AIHA and major bleeding complications  . Axillary lymphadenopathy    left negative MMG 1/08  . Back pain, chronic   . Complicated grief 4/31/5400   Death of husband in 01-09-11   . Diabetes mellitus   . Hyperlipidemia   . Hypertension   . Lupus    Treated with prednisone indefinitely   . Mitral regurgitation   . Mitral regurgitation 10/24/2006   2d Echo from Jan 09, 2008 - EF 60%, Moderated MR    . Seasonal allergies    Social History  Substance Use Topics  . Smoking status: Former Research scientist (life sciences)  . Smokeless tobacco: Never Used  . Alcohol use No   Family History  Problem Relation Age of Onset  . Heart disease Mother    Review of Systems: ROS in HPI. Otherwise: Review of Systems  Constitutional: Negative for chills, fever and weight loss.  Respiratory: Negative for cough and shortness of breath.   Cardiovascular: Negative for chest pain and leg swelling.  Gastrointestinal: Negative for abdominal pain, constipation, diarrhea, nausea and vomiting.  Genitourinary: Negative for dysuria, frequency and urgency.   Physical Exam: Vitals:   12/08/16 1524  BP: (!) 126/45  Pulse: 68  Temp: 97.7 F (36.5 C)  TempSrc: Oral  SpO2: 99%  Weight: 177 lb 6.4 oz (80.5 kg)   Physical Exam  Constitutional: She appears well-developed. She is cooperative. No distress.  HENT:  Mouth/Throat: Posterior oropharyngeal erythema present.  Cardiovascular: Normal rate, regular rhythm, normal heart sounds and  normal pulses.  Exam reveals no gallop.   No murmur heard. Pulmonary/Chest: Effort normal and breath sounds normal. No respiratory distress. She has no wheezes. She has no rhonchi. She has no rales. Breasts are symmetrical.  Abdominal: Soft. Bowel sounds are normal. There is no tenderness.  Musculoskeletal: She exhibits no edema.    Assessment & Plan:  See encounters tab for problem based medical decision making. Patient discussed with Dr. Eppie Gibson  Diabetes [facility]) Patient has a history of diabetes but is currently diet controlled. She is on chronic steroids prednisone 10 mg. Her last random blood glucose was 132 in August 2017. In spite of her glucocorticoid therapy she seems to be maintaining her blood sugar without medication. This has been a question her diagnosis of diabetes. Upon chart review, she does have a hemoglobin A1c ranging from 6.4-7.2 over the last several years.  Patient's point-of-care hemoglobin A1c today is 6.5  Patient denies any polydipsia or polyuria.  Assessment: Well controlled diabetes mellitus  Plan: Continue diet modification  Lupus ([place]) Patient is a history of SLE with low titers of dsDNA in the past. She is on chronic prednisone therapy in part for her lupus as well as for her history of autoimmune hemolytic anemia. She had CBC testing at her last visit in August 2017 which was within normal limits showing no signs of anemia. She denies any current manifestations of her lupus which were primarily arthritic in the past. Patient has not  had a Rhuem in several years, and reports that she has difficulty affording the specialists co-pays. We have made attempts to refer her to rheumatology during the last year which have been unsuccessful.  Assessment: Well controlled SLE  Plan: Continue prednisone 10 mg daily Continue vitamin D supplementation for concern of osteoporosis DEXA scan  Osteoporosis Patient is a history of vitamin D deficiency and osteoporosis on  DEXA scan in 2014. She is currently on chronic glucocorticoid therapy which will likely exacerbate this. Patient has not been on bisphosphonates for several years.  Patient has had one fall over the last year which think we do not result in fracture. Patient is at high fracture risk given her osteoporosis. Would like to reevaluate with DEXA scan and plan for reinitiation of bisphosphonate therapy if appropriate.  Assessment: Osteoporosis, currently on bisphosphonate holiday Vitamin D deficiency  Plan: DEXA scan, plan for bisphosphonate therapy as appropriate  Mitral regurgitation Patient is followed by cardiology for severe mitral regurgitation. Cardiology feels that her atrial fibrillation is permanent likely related to this evaluated dysfunction. She was evaluated for surgical management but was not a good candidate. She is currently not symptomatically with no dyspnea or shortness of breath. Patient is not very active and lives a sedentary lifestyle currently.  Assessment: Stable mitral regurg, not a surgical candidate  Plan: Continue furosemide and potassium    Essential hypertension Blood pressure currently well controlled on metoprolol and losartan. Patient denies any headache, chest pain, short of breath. Blood pressure today is 126/45.  Assessment: Well-controlled hypertension  Plan: Continue current therapy  Atrial fibrillation [facility]) Patient is a history of atrial fibrillation which bleed permanent secondary valvular dysfunction. She is followed by cardiology. Currently on rate control with metoprolol and anticoagulation with Elequis 5 mg twice a day. Patient has had no signs or symptoms of bleeding. She denies any symptoms of palpitation, shortness of breath, lightheaded or dizziness.  Assessment: Permanent atrial fibrillation secondary valvular dysfunction  Plan: Continue current therapy Sample of Eliquis 5 mg given today to hold her over for refill of Elequis is  from her mail-in pharmacy.   Signed: Holley Raring, MD 12/08/2016, 4:19 PM  Pager: [phone]

## 2016-12-08 NOTE — Assessment & Plan Note (Signed)
Patient is a history of SLE with low titers of dsDNA in the past. She is on chronic prednisone therapy in part for her lupus as well as for her history of autoimmune hemolytic anemia. She had CBC testing at her last visit in August 2017 which was within normal limits showing no signs of anemia. She denies any current manifestations of her lupus which were primarily arthritic in the past. Patient has not had a Rhuem in several years, and reports that she has difficulty affording the specialists co-pays. We have made attempts to refer her to rheumatology during the last year which have been unsuccessful.  Assessment: Well controlled SLE  Plan: Continue prednisone 10 mg daily Continue vitamin D supplementation for concern of osteoporosis DEXA scan

## 2016-12-09 ENCOUNTER — Encounter (HOSPITAL_COMMUNITY)
Admission: RE | Admit: 2016-12-09 | Discharge: 2016-12-09 | Disposition: A | Discharge status: Home / Self Care | Payer: Medicare Other | Source: Ambulatory Visit | Attending: Cardiovascular Disease | Admitting: Cardiovascular Disease

## 2016-12-09 DIAGNOSIS — R918 Other nonspecific abnormal finding of lung field: Secondary | ICD-10-CM | POA: Insufficient documentation

## 2016-12-09 LAB — GLUCOSE, CAPILLARY: Glucose-Capillary: 119 mg/dL — ABNORMAL HIGH (ref 65–99)

## 2016-12-09 MED ORDER — FLUDEOXYGLUCOSE F - 18 (FDG) INJECTION
8.8200 | Freq: Once | INTRAVENOUS | Status: AC | PRN
  Administered 2016-12-09: 8.82 via INTRAVENOUS

## 2016-12-21 ENCOUNTER — Other Ambulatory Visit: Payer: Self-pay | Admitting: Internal Medicine

## 2016-12-21 DIAGNOSIS — I10 Essential (primary) hypertension: Secondary | ICD-10-CM

## 2016-12-26 ENCOUNTER — Telehealth: Payer: Self-pay | Admitting: *Deleted

## 2016-12-26 NOTE — Telephone Encounter (Signed)
Pt calls and is worried about her meds, I called [facility] and they state the scripts were put on file, they are filling now for rush delivery but it may be 7 days, pt states she will be out of eliquis today Spoke w/ dr kim she will help pt with problem

## 2017-01-16 ENCOUNTER — Encounter: Payer: Self-pay | Admitting: Pharmacist

## 2017-01-16 NOTE — Progress Notes (Signed)
Medication Samples have been provided to the patient.  Drug: apixaban (Eliquis) Strength: 5 mg Qty: 56 LOT: [id number] Exp.Date: 1/20 Dosing instructions: 1 tablet BID  The patient has been instructed regarding the correct time, dose, and frequency of taking this medication, including desired effects and most common side effects.   Stephanie Horne 4:51 PM 01/16/2017

## 2017-02-15 ENCOUNTER — Encounter: Payer: Self-pay | Admitting: Pharmacist

## 2017-02-15 NOTE — Progress Notes (Signed)
Medication Samples have been provided to the patient.  Drug: apixaban (Eliquis) Strength: 5 mg Qty: 4 LOT: [id number] Exp.Date: January 2020 Dosing instructions: 1 tablet BID  The patient has been instructed regarding the correct time, dose, and frequency of taking this medication, including desired effects and most common side effects.   Stephanie Horne 5:20 PM 02/15/2017

## 2017-03-10 ENCOUNTER — Encounter: Payer: Self-pay | Admitting: *Deleted

## 2017-03-14 ENCOUNTER — Encounter: Payer: Self-pay | Admitting: Pharmacist

## 2017-03-14 ENCOUNTER — Other Ambulatory Visit: Payer: Self-pay | Admitting: Pharmacist

## 2017-03-14 ENCOUNTER — Ambulatory Visit: Payer: Self-pay | Admitting: Pharmacist

## 2017-03-14 DIAGNOSIS — I10 Essential (primary) hypertension: Secondary | ICD-10-CM

## 2017-03-14 DIAGNOSIS — E876 Hypokalemia: Secondary | ICD-10-CM

## 2017-03-14 NOTE — Progress Notes (Signed)
Stephanie Horne is a 76 y.o. female who reports to the clinic for monitoring of apixaban (Eliquis) therapy.    ASSESSMENT Indication(s): atrial fibrillation Duration: indefinite  Labs: Component Value Date/Time   AST 19 06/04/2014 1418   ALT 14 06/04/2014 1418   NA 147 (H) 05/04/2016 1340   K 3.4 (L) 05/04/2016 1340   CL 111 (H) 05/04/2016 1340   CO2 24 05/04/2016 1340   GLUCOSE 132 (H) 05/04/2016 1340   HGBA1C 6.5 12/08/2016 1542   HGBA1C 6.6 11/24/2009 1456   BUN 12 05/04/2016 1340   CREATININE 0.80 05/04/2016 1340   CALCIUM 8.8 05/04/2016 1340   GFRAA >60 01/07/2016 0909   GFRAA >89 01/15/2015 1222   WBC 3.7 (L) 05/04/2016 1340   HGB 12.7 05/04/2016 1340   HGB 14.4 10/07/2008 1419   HCT 38.5 05/04/2016 1340   HCT 43.3 10/07/2008 1419   PLT 139 (L) 05/04/2016 1340   PLT 150 10/07/2008 1419   apixaban (Eliquis) Dose: 5 mg BID  Safety: Patient has not had recent bleeding/thromboembolic events. Patient reports no recent signs or symptoms of bleeding, no signs of symptoms of thromboembolism. Medication changes: no.  Renal/hepatic/drug interaction concerns: no.  Adherence: Patient does correctly recite the dose. Patient reports being unable to afford the medication. Applied for tier exception on Eliquis to help with affordability for patient. Awaiting response from insurance.  Patient Instructions: Patient advised to contact clinic or seek medical attention if signs/symptoms of bleeding or thromboembolism occur. Patient verbalized understanding by repeating back information.  Follow-up Recommended labs to consider: CBC renal and hepatic function Patient is due for follow up, will try to coordinate appointment.  [name] PharmD Candidate  03/14/2017, 11:22 AM

## 2017-03-15 MED ORDER — FUROSEMIDE 40 MG PO TABS: ORAL_TABLET | ORAL | 1 refills | Status: DC

## 2017-03-15 MED ORDER — PRAVASTATIN SODIUM 40 MG PO TABS: 40.0000 mg | ORAL_TABLET | Freq: Every day | ORAL | 3 refills | Status: DC

## 2017-03-15 MED ORDER — POTASSIUM CHLORIDE ER 10 MEQ PO TBCR: 10.0000 meq | EXTENDED_RELEASE_TABLET | Freq: Every day | ORAL | 3 refills | Status: DC

## 2017-04-06 ENCOUNTER — Encounter: Payer: Self-pay | Admitting: Internal Medicine

## 2017-04-06 ENCOUNTER — Ambulatory Visit (INDEPENDENT_AMBULATORY_CARE_PROVIDER_SITE_OTHER): Payer: Medicare Other | Admitting: Internal Medicine

## 2017-04-06 ENCOUNTER — Other Ambulatory Visit: Payer: Self-pay | Admitting: Internal Medicine

## 2017-04-06 VITALS — BP 141/73 | HR 83 | Temp 98.1°F | Ht 70.0 in | Wt 180.0 lb

## 2017-04-06 DIAGNOSIS — M810 Age-related osteoporosis without current pathological fracture: Secondary | ICD-10-CM

## 2017-04-06 DIAGNOSIS — I482 Chronic atrial fibrillation: Secondary | ICD-10-CM

## 2017-04-06 DIAGNOSIS — Z79899 Other long term (current) drug therapy: Secondary | ICD-10-CM

## 2017-04-06 DIAGNOSIS — E785 Hyperlipidemia, unspecified: Secondary | ICD-10-CM | POA: Diagnosis not present

## 2017-04-06 DIAGNOSIS — M329 Systemic lupus erythematosus, unspecified: Secondary | ICD-10-CM

## 2017-04-06 DIAGNOSIS — M818 Other osteoporosis without current pathological fracture: Secondary | ICD-10-CM

## 2017-04-06 DIAGNOSIS — E118 Type 2 diabetes mellitus with unspecified complications: Secondary | ICD-10-CM

## 2017-04-06 DIAGNOSIS — I10 Essential (primary) hypertension: Secondary | ICD-10-CM

## 2017-04-06 DIAGNOSIS — E559 Vitamin D deficiency, unspecified: Secondary | ICD-10-CM | POA: Diagnosis not present

## 2017-04-06 DIAGNOSIS — Z862 Personal history of diseases of the blood and blood-forming organs and certain disorders involving the immune mechanism: Secondary | ICD-10-CM | POA: Diagnosis not present

## 2017-04-06 DIAGNOSIS — Z7952 Long term (current) use of systemic steroids: Secondary | ICD-10-CM

## 2017-04-06 DIAGNOSIS — I700 Atherosclerosis of aorta: Secondary | ICD-10-CM | POA: Diagnosis not present

## 2017-04-06 DIAGNOSIS — I340 Nonrheumatic mitral (valve) insufficiency: Secondary | ICD-10-CM

## 2017-04-06 DIAGNOSIS — E784 Other hyperlipidemia: Secondary | ICD-10-CM | POA: Diagnosis not present

## 2017-04-06 DIAGNOSIS — I272 Pulmonary hypertension, unspecified: Secondary | ICD-10-CM

## 2017-04-06 DIAGNOSIS — I2729 Other secondary pulmonary hypertension: Secondary | ICD-10-CM | POA: Diagnosis not present

## 2017-04-06 DIAGNOSIS — E1169 Type 2 diabetes mellitus with other specified complication: Secondary | ICD-10-CM

## 2017-04-06 DIAGNOSIS — Z7901 Long term (current) use of anticoagulants: Secondary | ICD-10-CM

## 2017-04-06 DIAGNOSIS — Z87891 Personal history of nicotine dependence: Secondary | ICD-10-CM

## 2017-04-06 DIAGNOSIS — I4821 Permanent atrial fibrillation: Secondary | ICD-10-CM

## 2017-04-06 LAB — POCT GLYCOSYLATED HEMOGLOBIN (HGB A1C): Hemoglobin A1C: 6.9

## 2017-04-06 LAB — GLUCOSE, CAPILLARY: Glucose-Capillary: 161 mg/dL — ABNORMAL HIGH (ref 65–99)

## 2017-04-06 MED ORDER — PREDNISONE 5 MG PO TABS: 7.5000 mg | ORAL_TABLET | Freq: Every day | ORAL | 0 refills | Status: DC

## 2017-04-06 MED ORDER — APIXABAN 5 MG PO TABS: 5.0000 mg | ORAL_TABLET | Freq: Two times a day (BID) | ORAL | 11 refills | Status: DC

## 2017-04-06 MED ORDER — LEVOCETIRIZINE DIHYDROCHLORIDE 5 MG PO TABS: ORAL_TABLET | ORAL | 3 refills | Status: DC

## 2017-04-06 MED ORDER — OMEPRAZOLE 20 MG PO CPDR: 20.0000 mg | DELAYED_RELEASE_CAPSULE | Freq: Every day | ORAL | 3 refills | Status: DC

## 2017-04-06 MED ORDER — TRAZODONE HCL 50 MG PO TABS: ORAL_TABLET | ORAL | 3 refills | Status: DC

## 2017-04-06 MED ORDER — METOPROLOL TARTRATE 50 MG PO TABS: ORAL_TABLET | ORAL | 3 refills | Status: DC

## 2017-04-06 NOTE — Patient Instructions (Signed)
I want you to decrease the prednisone to 7.5 mg daily (1 and 1/2 of the 5mg  pills). I have referred you to a rheumatologist to discuss the Lupus diagnosis. I am checking your labs, I will give you a call with the results. I would also like you to complete the DEXA scan for your bone strength!

## 2017-04-10 LAB — BMP8+ANION GAP
Anion Gap: 19 mmol/L — ABNORMAL HIGH (ref 10.0–18.0)
BUN / CREAT RATIO: 15 (ref 12–28)
BUN: 13 mg/dL (ref 8–27)
CALCIUM: 9.7 mg/dL (ref 8.7–10.3)
CHLORIDE: 102 mmol/L (ref 96–106)
CO2: 18 mmol/L — ABNORMAL LOW (ref 20–29)
CREATININE: 0.87 mg/dL (ref 0.57–1.00)
GFR, EST AFRICAN AMERICAN: 74 mL/min/{1.73_m2} (ref >59)
GFR, EST NON AFRICAN AMERICAN: 64 mL/min/{1.73_m2} (ref >59)
GLUCOSE: 142 mg/dL — AB (ref 65–99)
Potassium: 5.3 mmol/L — ABNORMAL HIGH (ref 3.5–5.2)
Sodium: 139 mmol/L (ref 134–144)

## 2017-04-10 LAB — CBC WITH DIFFERENTIAL/PLATELET
BASOS ABS: 0 10*3/uL (ref 0.0–0.2)
Basos: 0 %
EOS (ABSOLUTE): 0 10*3/uL (ref 0.0–0.4)
EOS: 1 %
HEMATOCRIT: 46.3 % (ref 34.0–46.6)
HEMOGLOBIN: 15.5 g/dL (ref 11.1–15.9)
IMMATURE GRANS (ABS): 0 10*3/uL (ref 0.0–0.1)
Immature Granulocytes: 1 %
LYMPHS: 14 %
Lymphocytes Absolute: 0.9 10*3/uL (ref 0.7–3.1)
MCH: 31.3 pg (ref 26.6–33.0)
MCHC: 33.5 g/dL (ref 31.5–35.7)
MCV: 93 fL (ref 79–97)
MONOCYTES: 4 %
Monocytes Absolute: 0.2 10*3/uL (ref 0.1–0.9)
Neutrophils Absolute: 5.2 10*3/uL (ref 1.4–7.0)
Neutrophils: 80 %
Platelets: 158 10*3/uL (ref 150–379)
RBC: 4.96 x10E6/uL (ref 3.77–5.28)
RDW: 14 % (ref 12.3–15.4)
WBC: 6.4 10*3/uL (ref 3.4–10.8)

## 2017-04-10 LAB — LIPID PANEL
CHOL/HDL RATIO: 2.4 ratio (ref 0.0–4.4)
Cholesterol, Total: 182 mg/dL (ref 100–199)
HDL: 77 mg/dL (ref >39)
LDL Calculated: 83 mg/dL (ref 0–99)
TRIGLYCERIDES: 110 mg/dL (ref 0–149)
VLDL CHOLESTEROL CAL: 22 mg/dL (ref 5–40)

## 2017-04-10 LAB — VITAMIN D 25 HYDROXY (VIT D DEFICIENCY, FRACTURES): Vit D, 25-Hydroxy: 8 ng/mL — ABNORMAL LOW (ref 30.0–100.0)

## 2017-04-10 LAB — ANTI-DNA ANTIBODY, DOUBLE-STRANDED: DSDNA AB: 11 [IU]/mL — AB (ref 0–9)

## 2017-04-10 LAB — ANTINUCLEAR ANTIBODIES, IFA: ANA TITER 1: NEGATIVE

## 2017-04-10 MED ORDER — VITAMIN D (ERGOCALCIFEROL) 1.25 MG (50000 UNIT) PO CAPS: 50000.0000 [IU] | ORAL_CAPSULE | ORAL | 0 refills | Status: DC

## 2017-04-11 DIAGNOSIS — I700 Atherosclerosis of aorta: Secondary | ICD-10-CM | POA: Insufficient documentation

## 2017-04-11 NOTE — Assessment & Plan Note (Signed)
HPI: History of Vit D deficency  A: Vit D deficency  P: Vit D level returned at 8, I called her will have her take 50000 units of Vit D2 weekly for 12 weeks.

## 2017-04-11 NOTE — Assessment & Plan Note (Signed)
HPI: she has a eliquis savings card with her today, she reports she needs a new prescription to use it.  Otherwise she has no complaints.  A: Permanent Afib  P: Eliquis 5mg  BID for A/C Rate control with metoprolol 50mg  BID

## 2017-04-11 NOTE — Assessment & Plan Note (Signed)
Noted on previous CTA of chest/abdomen in may 2017.  Currently on pravastatin.

## 2017-04-11 NOTE — Assessment & Plan Note (Signed)
HPI: she has a history in her chart of Osteoporosis, per chart she received 5 years of bisphoshphonate therapy which ended in 2014.  Due to change in EMR during this time it is difficult to confirm all of this.  Per my review she had a DEXA scan in 2005 which revealed a left forearm T score of -2.4 (osteopenia) a repeat DEXA in 2014 showed the left forarm T score was -2.3.  A: Hx of Osteoporosis, on chronic prednisone therapy  P: Overall it appears she completed 5 years of bisphosphonate therapy. Given she remains at risk with her chronic prednisone use I have discussed the importance of obtaining a repeat DEXA scan today.

## 2017-04-11 NOTE — Progress Notes (Signed)
[facility] Subjective:  HPI: Stephanie Horne is a 76 y.o. female who presents for follow up of multiple chronic medical issues including Lupus, T2DM, HTN.  I have recently been reassigned as her PCP.  She is accompanied by a church friend today.  Please see Assessment and Plan below for the status of her chronic medical problems.  Review of Systems: Review of Systems  Constitutional: Negative for fever.  HENT: Negative for hearing loss.   Eyes: Negative for blurred vision.  Respiratory: Negative for cough.   Cardiovascular: Negative for chest pain.  Gastrointestinal: Negative for abdominal pain.  Genitourinary: Negative for dysuria.  Musculoskeletal: Negative for falls and joint pain.  Skin: Negative for rash.  Neurological: Negative for dizziness.  Endo/Heme/Allergies: Negative for polydipsia.  Psychiatric/Behavioral: Negative for depression and memory loss.    Objective:  Physical Exam: Vitals:   04/06/17 1031  BP: (!) 141/73  Pulse: 83  Temp: 98.1 F (36.7 C)  TempSrc: Other (Comment)  SpO2: 98%  Weight: 180 lb (81.6 kg)  Height: 5\' 10"  (1.778 m)  Physical Exam  Constitutional: She is well-developed, well-nourished, and in no distress.  Cardiovascular: An irregularly irregular rhythm present.  No murmur heard. Pulmonary/Chest: Effort normal and breath sounds normal.  Abdominal: Soft. Bowel sounds are normal.  Musculoskeletal: She exhibits edema (trace bilateral pedal edema).  Skin: Skin is warm and dry. No rash noted.  Nursing note and vitals reviewed.  Assessment & Plan:  Essential hypertension HPI: No complaints, reports compliance with medications.  A: Essential HTN at goal  P: Continue metoprolol 50mg  BID Continue losartan 100mg  daily   Atrial fibrillation (HCC) HPI: she has a eliquis savings card with her today, she reports she needs a new prescription to use it.  Otherwise she has no complaints.  A: Permanent  Afib  P: Eliquis 5mg  BID for A/C Rate control with metoprolol 50mg  BID  Pulmonary hypertension (HCC) HPI: She has a history of moderate pulmonary HTN by right heart cath which is likely secondary to severe mitral regurgitation.  A: Moderate Pulmonary HTN  P: Denies significant sob today.  She is not a surgical canddiate for mitral valve surgery, she failed mitral valve clip placement in 2017.  Severe mitral regurgitation Follows with Dr Burt Knack, failed Mitral valve clip placement in 2017.  Not a surgical candidate.   Diabetes ([place]) HPI: she has diet controlled DM which is likely contributed by her chronic steroid use.    A: Well controlled Type 2 DM without complication.  P: A1c today 6.9 % Continue diet control  Hyperlipidemia associated with type 2 diabetes mellitus (HCC) HPI: Currently on pravastatin 40mg  daily  A: Hyperlipidemia  P: Repeat lipid panel Continue pravastatin 40mg  daily.  Osteoporosis HPI: she has a history in her chart of Osteoporosis, per chart she received 5 years of bisphoshphonate therapy which ended in 2014.  Due to change in EMR during this time it is difficult to confirm all of this.  Per my review she had a DEXA scan in 2005 which revealed a left forearm T score of -2.4 (osteopenia) a repeat DEXA in 2014 showed the left forarm T score was -2.3.  A: Hx of Osteoporosis, on chronic prednisone therapy  P: Overall it appears she completed 5 years of bisphosphonate therapy. Given she remains at risk with her chronic prednisone use I have discussed the importance of obtaining a repeat DEXA scan today.  History of autoimmune hemolytic anemia HPI: She has a of autoimmune  hemolytic anemia, which per my review of the chart appears to be the main presentation of her lupus. In my review of her chart she is noted to have autoimmune hemolytic anemia in 2009, it is noted that this returned in 2012 when her prednisone was tapered. There is some reference to a Dr.  Beryle Beams note on 02/04/2011 and subsequent notes have stated that she is to remain on prednisone 10 mg indefinitely.  Unfortunately I am not able to find this note in the chart. I do see Dr. Azucena Freed consult note on 02/02/2011, however it makes no mention of her lifelong prednisone recommendation.  Furthermore complicates his case is that her lupus diagnosis is not very clear and it appears that previous PCPs have tried to refer her multiple times to rheumatology but this has not happened.  Assessment:  History of autoimmune hemolytic anemia  Plan Lee C Lupus for further details however we will try to taper her prednisone slightly to 7.5 mg daily as well as obtain a rheumatology consult.  Vitamin D deficiency HPI: History of Vit D deficency  A: Vit D deficency  P: Vit D level returned at 8, I called her will have her take 50000 units of Vit D2 weekly for 12 weeks.  Lupus ([place]) History of present illness: She is a history of lupus documented in her chart. He reports that she used to see and now retired rheumatologist Dr. Justine Null. She notes that she was diagnosed many years ago, she thinks that she may have had a positive ANAs as well as a butterfly rash. She does not remember much else as far as other symptoms from her lupus. In her chart she has 2 cases of autoimmune hemolytic anemia which have been attributed to her lupus. It appears that she was treated with Imuran and prednisone during this cases and eventually it was decided that she will continue on prednisone 10 mg indefinitely. Unfortunately I cannot find additional documentation and notes from Dr. Justine Null, there are numerous of her clinic notes did note that there is a question of the diagnosis of lupus. Of note she does have a negative AMA on 2 occasions in the past as well as a low double-stranded DNA titer.  Recently it appears she was referred to establish with a new rheumatologist however she did not go. In asking her about all this she  is unclear about her diagnosis of lupus states to me that she wonders if she has lupus at all and questions what I think about her diagnosis. I told her that at this time it's hard for me to tell exactly what is going on with this diagnosis.  Assessment lupus  Plan: Our plan is to repeat in ANA as well as the extremity DNA this is again has returned as an negative ANA and a very low titer of double-stranded DNA. I do wonder about the diagnosis of lupus although this does essentially sound very reasonable given she did have autoimmune hemolytic anemia.  She is concerned about the prednisone and potential adverse effects of this.  At this point I think it will be OK to decrease her prednisone to 7.5mg  daily until I see her next and hopefully sort more of this out. I will make sure her vitamin D level is normal and repeat a DEXA scan. -Will place a referral to Rheumology for assistance with the diagnosis of Lupus and if the best strategy of treatment is continued prednisone versus other steroid sparing options.  Abdominal aortic  atherosclerosis ([place]) Noted on previous CTA of chest/abdomen in may 2017.  Currently on pravastatin.   Medications Ordered Meds ordered this encounter  Medications  . apixaban (ELIQUIS) 5 MG TABS tablet    Sig: Take 1 tablet (5 mg total) by mouth 2 (two) times daily.    Dispense:  60 tablet    Refill:  11  . metoprolol tartrate (LOPRESSOR) 50 MG tablet    Sig: TAKE 1 TABLET (50 MG TOTAL) BY MOUTH 2 (TWO) TIMES DAILY.    Dispense:  180 tablet    Refill:  3  . traZODone (DESYREL) 50 MG tablet    Sig: TAKE 1 TABLET (50 MG TOTAL) BY MOUTH AT BEDTIME AS NEEDED FOR SLEEP.    Dispense:  30 tablet    Refill:  3  . omeprazole (PRILOSEC) 20 MG capsule    Sig: Take 1 capsule (20 mg total) by mouth daily.    Dispense:  90 capsule    Refill:  3  . levocetirizine (XYZAL) 5 MG tablet    Sig: TAKE 0.5 TABLETS (2.5 MG TOTAL) BY MOUTH EVERY EVENING.    Dispense:  45 tablet     Refill:  3  . predniSONE (DELTASONE) 5 MG tablet    Sig: Take 1.5 tablets (7.5 mg total) by mouth daily.    Dispense:  135 tablet    Refill:  0  . Vitamin D, Ergocalciferol, (DRISDOL) 50000 units CAPS capsule    Sig: Take 1 capsule (50,000 Units total) by mouth every 7 (seven) days.    Dispense:  12 capsule    Refill:  0   Other Orders Orders Placed This Encounter  Procedures  . Glucose, capillary  . Vitamin D (25 hydroxy)  . BMP8+Anion Gap  . CBC with Diff  . ANA, IFA (with reflex)  . Anti-DNA antibody, Native double-stranded  . Lipid Profile  . Ambulatory referral to Rheumatology    Referral Priority:   Routine    Referral Type:   Consultation    Referral Reason:   Specialty Services Required    Requested Specialty:   Rheumatology    Number of Visits Requested:   1  . POC Hbg A1C   Follow Up: Return in about 3 months (around 07/07/2017), or if symptoms worsen or fail to improve.

## 2017-04-11 NOTE — Assessment & Plan Note (Signed)
HPI: she has diet controlled DM which is likely contributed by her chronic steroid use.    A: Well controlled Type 2 DM without complication.  P: A1c today 6.9 % Continue diet control

## 2017-04-11 NOTE — Assessment & Plan Note (Signed)
HPI: No complaints, reports compliance with medications.  A: Essential HTN at goal  P: Continue metoprolol 50mg  BID Continue losartan 100mg  daily

## 2017-04-11 NOTE — Assessment & Plan Note (Signed)
Follows with Dr Burt Knack, failed Mitral valve clip placement in 2017.  Not a surgical candidate.

## 2017-04-11 NOTE — Assessment & Plan Note (Signed)
HPI: She has a of autoimmune hemolytic anemia, which per my review of the chart appears to be the main presentation of her lupus. In my review of her chart she is noted to have autoimmune hemolytic anemia in 2009, it is noted that this returned in 2012 when her prednisone was tapered. There is some reference to a Dr. Beryle Beams note on 02/04/2011 and subsequent notes have stated that she is to remain on prednisone 10 mg indefinitely.  Unfortunately I am not able to find this note in the chart. I do see Dr. Azucena Freed consult note on 02/02/2011, however it makes no mention of her lifelong prednisone recommendation.  Furthermore complicates his case is that her lupus diagnosis is not very clear and it appears that previous PCPs have tried to refer her multiple times to rheumatology but this has not happened.  Assessment:  History of autoimmune hemolytic anemia  Plan Lee C Lupus for further details however we will try to taper her prednisone slightly to 7.5 mg daily as well as obtain a rheumatology consult.

## 2017-04-11 NOTE — Assessment & Plan Note (Signed)
HPI: She has a history of moderate pulmonary HTN by right heart cath which is likely secondary to severe mitral regurgitation.  A: Moderate Pulmonary HTN  P: Denies significant sob today.  She is not a surgical canddiate for mitral valve surgery, she failed mitral valve clip placement in 2017.

## 2017-04-11 NOTE — Assessment & Plan Note (Signed)
HPI: Currently on pravastatin 40mg  daily  A: Hyperlipidemia  P: Repeat lipid panel Continue pravastatin 40mg  daily.

## 2017-04-11 NOTE — Assessment & Plan Note (Signed)
History of present illness: She is a history of lupus documented in her chart. He reports that she used to see and now retired rheumatologist Dr. Justine Null. She notes that she was diagnosed many years ago, she thinks that she may have had a positive ANAs as well as a butterfly rash. She does not remember much else as far as other symptoms from her lupus. In her chart she has 2 cases of autoimmune hemolytic anemia which have been attributed to her lupus. It appears that she was treated with Imuran and prednisone during this cases and eventually it was decided that she will continue on prednisone 10 mg indefinitely. Unfortunately I cannot find additional documentation and notes from Dr. Justine Null, there are numerous of her clinic notes did note that there is a question of the diagnosis of lupus. Of note she does have a negative AMA on 2 occasions in the past as well as a low double-stranded DNA titer.  Recently it appears she was referred to establish with a new rheumatologist however she did not go. In asking her about all this she is unclear about her diagnosis of lupus states to me that she wonders if she has lupus at all and questions what I think about her diagnosis. I told her that at this time it's hard for me to tell exactly what is going on with this diagnosis.  Assessment lupus  Plan: Our plan is to repeat in ANA as well as the extremity DNA this is again has returned as an negative ANA and a very low titer of double-stranded DNA. I do wonder about the diagnosis of lupus although this does essentially sound very reasonable given she did have autoimmune hemolytic anemia.  She is concerned about the prednisone and potential adverse effects of this.  At this point I think it will be OK to decrease her prednisone to 7.5mg  daily until I see her next and hopefully sort more of this out. I will make sure her vitamin D level is normal and repeat a DEXA scan. -Will place a referral to Rheumology for assistance with  the diagnosis of Lupus and if the best strategy of treatment is continued prednisone versus other steroid sparing options.

## 2017-04-18 ENCOUNTER — Encounter: Payer: Self-pay | Admitting: Internal Medicine

## 2017-04-18 ENCOUNTER — Ambulatory Visit
Admission: RE | Admit: 2017-04-18 | Discharge: 2017-04-18 | Disposition: A | Discharge status: Home / Self Care | Payer: Medicare Other | Source: Ambulatory Visit | Attending: Internal Medicine | Admitting: Internal Medicine

## 2017-04-18 DIAGNOSIS — M810 Age-related osteoporosis without current pathological fracture: Secondary | ICD-10-CM | POA: Diagnosis not present

## 2017-04-18 DIAGNOSIS — Z780 Asymptomatic menopausal state: Secondary | ICD-10-CM | POA: Diagnosis not present

## 2017-04-21 ENCOUNTER — Telehealth: Payer: Self-pay

## 2017-04-21 NOTE — Telephone Encounter (Signed)
Called and discussed DEXA results, she will likely benefit from restarting alendronate, but will wait to do this until our next appointment when I can recheck her Vit D level. Could we please schedule her to see me back in about 6-8 weeks.  I also reminded her I am referring her to rheumatology.  She reports she has not heard anything yet, do we know where this referral stands?

## 2017-04-21 NOTE — Telephone Encounter (Signed)
Requesting test results. Please call pt back.  

## 2017-04-24 NOTE — Telephone Encounter (Signed)
Dr Angelia Mould wants to see pt in 6 to 8 weeks

## 2017-05-19 ENCOUNTER — Encounter: Payer: Self-pay | Admitting: Physician Assistant

## 2017-05-19 ENCOUNTER — Ambulatory Visit (INDEPENDENT_AMBULATORY_CARE_PROVIDER_SITE_OTHER): Payer: Medicare Other | Admitting: Physician Assistant

## 2017-05-19 VITALS — BP 146/74 | HR 78 | Ht 70.5 in | Wt 154.0 lb

## 2017-05-19 DIAGNOSIS — I340 Nonrheumatic mitral (valve) insufficiency: Secondary | ICD-10-CM

## 2017-05-19 DIAGNOSIS — I10 Essential (primary) hypertension: Secondary | ICD-10-CM

## 2017-05-19 DIAGNOSIS — R634 Abnormal weight loss: Secondary | ICD-10-CM | POA: Diagnosis not present

## 2017-05-19 DIAGNOSIS — I700 Atherosclerosis of aorta: Secondary | ICD-10-CM

## 2017-05-19 DIAGNOSIS — I4821 Permanent atrial fibrillation: Secondary | ICD-10-CM

## 2017-05-19 DIAGNOSIS — I38 Endocarditis, valve unspecified: Secondary | ICD-10-CM | POA: Diagnosis not present

## 2017-05-19 DIAGNOSIS — I482 Chronic atrial fibrillation: Secondary | ICD-10-CM | POA: Diagnosis not present

## 2017-05-19 DIAGNOSIS — I5032 Chronic diastolic (congestive) heart failure: Secondary | ICD-10-CM | POA: Diagnosis not present

## 2017-05-19 NOTE — Progress Notes (Signed)
Cardiology Office Note:    Date:  05/19/2017   ID:  Stephanie Horne, DOB 05/28/1941, MRN [id number]  PCP:  Stephanie Groves, DO  Cardiologist:  Dr. Sherren Horne    Referring MD: Stephanie Groves, DO   Chief Complaint  Patient presents with  . Follow-up    Heart failure, atrial fibrillation, mitral valve disease    History of Present Illness:    Stephanie Horne is a 76 y.o. female with a hx of permanent AF, mitral valve regurgitation, diastolic HF, DM, HTN, HL, SLE, autoimmune hemolytic anemia.  Stephanie Horne has been nonadherent warfarin in the past. Stephanie Horne is anticoagulated with Apixaban. Stephanie Horne has seen Dr. Roxy Horne in the past. Stephanie Horne is felt to be high risk for conventional surgical mitral valve repair/replacement. Stephanie H[place]. The procedure could not be completed due to anatomical issues with transseptal puncture related to the patient's severe kyphoscoliosis.  Last seen by Dr. Sherren Horne in 2/18.    Stephanie Horne returns for Cardiology follow up.  Stephanie Horne is here with Stephanie Horne friend Stephanie Horne).  Stephanie Horne denies any change in Stephanie Horne breathing.  Stephanie Horne is NYHA 2b.  Stephanie Horne is mainly limited by Stephanie Horne balance and concerns for falling.  Stephanie Horne denies chest pain, paroxysmal nocturnal dyspnea, syncope, cough, wheezing.  Stephanie Horne denies any bleeding.  Stephanie Horne has chronic LE edema.  Stephanie Horne weight is down ~ 30 lbs since July.  Stephanie Horne has been dieting and has been under a lot of stress.  Stephanie Horne does not have much of an appetite.    Prior CV studies:   The following studies were reviewed today:  PET Scan 21-Jan-2017 IMPRESSION: Negative PET-CT. No worrisome hypermetabolic pulmonary lesions to suggest neoplastic process.  Chest CTA 5/17 IMPRESSION: Left and right atrial enlargement with enlargement and left atrial appendage. This is consistent with the given clinical history. Changes of pulmonary arterial hypertension are also noted. No thoracic aortic aneurysm is noted. No pulmonary  emboli are noted. The emphysematous changes with some stable nodular changes on the right although there are new nodular changes along the major fissure. The largest of these measures 1.3 cm. Some associated smaller nodules are identified varying in size from 5 to 9 mm. Neoplasm deserves consideration and further workup is recommended. PET-CT may be helpful in this regard. Atherosclerotic changes in the abdominal aorta and iliac vessels without significant stenosis. Post catheterization hematoma in the right inguinal region.  R/L Cardiac Catheterization 01/2016 LAD ostial 25 RCA mid 20 4+ MR Mean RA 9, PASP 56, mean PA 37, mean PCWP 17, LVEDP 17  TEE January 22, 2016 Mild LVH, EF 60-65, no RWMA, severe MR, severe prolapse of A1/A2 segments of ant leaflet, severe LAE, mild RAE, mod TR, RV-RA gradient 51  Echo 07/2014 Mild focal basal septal hypertrophy, EF 60-65, no RWMA, myxomatous MV w/ prolapse, mod to severe MR, severe LAE, mild reduced RVSF, severe RAE, mild to mod TR, PASP 62    Past Medical History:  Diagnosis Date  . AIHA (autoimmune hemolytic anemia) (Stephanie Horne)    12/08 onset  . Atrial fibrillation ([place])    not on coumidin because AIHA and major bleeding complications  . Axillary lymphadenopathy    left negative MMG 1/08  . Back pain, chronic   . Complicated grief 3/47/4259   Death of husband in January 22, 2011   . Diabetes mellitus   . Hyperlipidemia   . Hypertension   . Lupus    Treated with prednisone  indefinitely   . Mitral regurgitation   . Mitral regurgitation 10/24/2006   2d Echo from 2009 - EF 60%, Moderated MR    . Seasonal allergies     Past Surgical History:  Procedure Laterality Date  . CARDIAC CATHETERIZATION N/A 01/07/2016   Procedure: Right/Left Heart Cath and Coronary Angiography;  Surgeon: Stephanie Mocha, MD;  Location: [facility];  Service: Cardiovascular;  Laterality: N/A;  . EYE SURGERY     cataract in both eyes  . TEE WITHOUT CARDIOVERSION N/A 12/09/2015    Procedure: TRANSESOPHAGEAL ECHOCARDIOGRAM (TEE);  Surgeon: [name], MD;  Location: [facility] ENDOSCOPY;  Service: Cardiovascular;  Laterality: N/A;    Current Medications: Current Meds  Medication Sig  . apixaban (ELIQUIS) 5 MG TABS tablet Take 1 tablet (5 mg total) by mouth 2 (two) times daily.  . Calcium Carbonate-Vitamin D (CALTRATE 600+D) 600-400 MG-UNIT per tablet Take 1 tablet by mouth 3 (three) times daily with meals.  . diclofenac sodium (VOLTAREN) 1 % GEL Apply 1 application topically 2 (two) times daily as needed. For leg, left hip and foot pain  . furosemide (LASIX) 40 MG tablet TAKE 1 TABLET (40 MG TOTAL) BY MOUTH DAILY.  Marland Kitchen glucose blood (ONE TOUCH ULTRA TEST) test strip Use to check blood sugar 1 to 2 times daily. diag code E11.9. Non insulin dependent  . levocetirizine (XYZAL) 5 MG tablet TAKE 0.5 TABLETS (2.5 MG TOTAL) BY MOUTH EVERY EVENING.  Marland Kitchen losartan (COZAAR) 100 MG tablet Take 1 tablet (100 mg total) by mouth daily.  . metoprolol tartrate (LOPRESSOR) 50 MG tablet TAKE 1 TABLET (50 MG TOTAL) BY MOUTH 2 (TWO) TIMES DAILY.  . nitroGLYCERIN (NITROSTAT) 0.4 MG SL tablet Place 1 tablet (0.4 mg total) under the tongue every 5 (five) minutes as needed for chest pain.  Marland Kitchen omeprazole (PRILOSEC) 20 MG capsule Take 1 capsule (20 mg total) by mouth daily.  Glory Rosebush DELICA LANCETS FINE MISC 1 each by Does not apply route daily.  Vladimir Faster Glycol-Propyl Glycol (SYSTANE OP) Place 1 drop into both eyes daily.  . potassium chloride (K-DUR) 10 MEQ tablet Take 1 tablet (10 mEq total) by mouth daily.  . potassium chloride (K-DUR,KLOR-CON) 10 MEQ tablet TAKE 1 TABLET BY MOUTH  DAILY  . pravastatin (PRAVACHOL) 40 MG tablet Take 1 tablet (40 mg total) by mouth daily.  . predniSONE (DELTASONE) 5 MG tablet Take 1.5 tablets (7.5 mg total) by mouth daily.  . traZODone (DESYREL) 50 MG tablet TAKE 1 TABLET (50 MG TOTAL) BY MOUTH AT BEDTIME AS NEEDED FOR SLEEP.  . Vitamin D, Ergocalciferol, (DRISDOL)  50000 units CAPS capsule Take 1 capsule (50,000 Units total) by mouth every 7 (seven) days.     Allergies:   Metformin and related; Lisinopril; and Pollen extract   Social History   Social History  . Marital status: Married    Spouse name: N/A  . Number of children: N/A  . Years of education: N/A   Social History Main Topics  . Smoking status: Former Research scientist (life sciences)  . Smokeless tobacco: Never Used  . Alcohol use No  . Drug use: No  . Sexual activity: Not Asked     Comment: married, retired Animal nutritionist, no smoking, alochol, illegal drug, Stephanie Horne husband was clinic patient and died of lung cancer   Other Topics Concern  . None   Social History Narrative  . None     Family Hx: The patient's family history includes Heart disease in Stephanie Horne mother.  ROS:   Please see the history of present illness.    Review of Systems  Constitution: Positive for decreased appetite.  Cardiovascular: Positive for leg swelling.  Psychiatric/Behavioral: The patient is nervous/anxious.    All other systems reviewed and are negative.   EKGs/Labs/Other Test Reviewed:    EKG:  EKG is  ordered today.  The ekg ordered today demonstrates Atrial fibrillation, HR 78, normal axis, nonspecific ST-T wave changes, PVC versus aberrancy, QTc 428 ms, no change from prior tracing  Recent Labs: 04/06/2017: BUN 13; Creatinine, Ser 0.87; Hemoglobin 15.5; Platelets 158; Potassium 5.3; Sodium 139   Recent Lipid Panel Lab Results  Component Value Date/Time   CHOL 182 04/06/2017 11:29 AM   TRIG 110 04/06/2017 11:29 AM   HDL 77 04/06/2017 11:29 AM   CHOLHDL 2.4 04/06/2017 11:29 AM   CHOLHDL 3.1 06/04/2014 02:18 PM   LDLCALC 83 04/06/2017 11:29 AM    Physical Exam:    VS:  BP (!) 146/74   Pulse 78   Ht 5' 10.5" (1.791 m)   Wt 154 lb (69.9 kg)   BMI 21.78 kg/m     Wt Readings from Last 3 Encounters:  05/19/17 154 lb (69.9 kg)  04/06/17 180 lb (81.6 kg)  12/08/16 177 lb 6.4 oz (80.5 kg)     Physical Exam    Constitutional: Stephanie Horne is oriented to person, place, and time. Stephanie Horne appears well-developed and well-nourished. No distress.  HENT:  Head: Normocephalic and atraumatic.  Eyes: No scleral icterus.  Neck: No JVD present.  Cardiovascular: Normal rate and regular rhythm.   Murmur heard.  Holosystolic murmur is present with a grade of 3/6  at the lower left sternal border Pulmonary/Chest: Effort normal. Stephanie Horne has no rales.  Abdominal: Soft.  Musculoskeletal: Stephanie Horne exhibits edema (1+ bilateral LE edema).  Neurological: Stephanie Horne is alert and oriented to person, place, and time.  Skin: Skin is warm and dry.  Psychiatric: Stephanie Horne has a normal mood and affect.    ASSESSMENT:    1. Permanent atrial fibrillation ([place])   2. Severe mitral regurgitation   3. Heart failure due to valvular disease, chronic, diastolic ([place])   4. Essential hypertension   5. Abdominal aortic atherosclerosis ([place])   6. Weight loss    PLAN:    In order of problems listed above:  1.Permanent atrial fibrillation (H[facility]) Rate is controlled.  Stephanie Horne is tolerating anticoagulation with Apixaban. Recent Creatinine, hemoglobin was normal.  CHADS2-VASc=6 (HTN, DM, female, 76 yo, CHF).  Continue long term anticoagulation.    2. Severe mitral regurgitation Stephanie Horne has a hx of failed attempt at Mitral-clip.  Stephanie Horne is overall stable.  Stephanie Horne is not a surgical candidate.  3. Heart failure due to valvular disease, chronic, diastolic (HCC) Volume stable.  Stephanie Horne is NYHA 2b.  Stephanie Horne has had significant weight loss in last 45 days.  Continue current dose of Furosemide, Losartan, Metoprolol.  4.Essential hypertension Fair control.  With weight loss, will continue to monitor for now.   5. Abdominal aortic atherosclerosis (HCC) Continue statin.  Stephanie Horne is not on ASA as Stephanie Horne is on Apixaban.  6. Weight loss Etiology not clear.  May be due to dieting and poor appetite.  I have asked Stephanie Horne to follow up with Stephanie Horne PCP next week at Stephanie Horne regular appointment to discuss Stephanie Horne weight  loss.  Dispo:  Return in about 6 months (around 11/19/2017) for Routine Follow Up, w/ Dr. Burt Knack.   Medication Adjustments/Labs and Tests Ordered: Current medicines are reviewed at  length with the patient today.  Concerns regarding medicines are outlined above.  Tests Ordered: No orders of the defined types were placed in this encounter.  Medication Changes: No orders of the defined types were placed in this encounter.   Signed, Richardson Dopp, PA-C  05/19/2017 11:25 AM    [facility] [address] [place], [place], [place]  [address] Phone: [phone]; Fax: [phone]

## 2017-05-19 NOTE — Patient Instructions (Signed)
Medication Instructions: - Your physician recommends that you continue on your current medications as directed. Please refer to the Current Medication list given to you today.  Labwork: - None Ordered  Procedures/Testing: - None Ordered  Follow-Up: - Your physician wants you to follow-up in: 6 MONTHS with Dr. Burt Knack. You will receive a reminder letter in the mail two months in advance. If you don't receive a letter, please call our office to schedule the follow-up appointment.   Any Additional Special Instructions Will Be Listed Below (If Applicable).  - Please discuss weight loss with your Primary Care Physician.

## 2017-05-22 NOTE — Addendum Note (Signed)
Addended by: Briant Cedar on: 05/22/2017 11:33 AM   Modules accepted: Orders

## 2017-05-25 ENCOUNTER — Ambulatory Visit (INDEPENDENT_AMBULATORY_CARE_PROVIDER_SITE_OTHER): Payer: Medicare Other | Admitting: Internal Medicine

## 2017-05-25 ENCOUNTER — Encounter: Payer: Self-pay | Admitting: Internal Medicine

## 2017-05-25 VITALS — BP 123/71 | HR 57 | Temp 97.9°F | Ht 70.5 in | Wt 183.2 lb

## 2017-05-25 DIAGNOSIS — M810 Age-related osteoporosis without current pathological fracture: Secondary | ICD-10-CM | POA: Diagnosis not present

## 2017-05-25 DIAGNOSIS — I10 Essential (primary) hypertension: Secondary | ICD-10-CM

## 2017-05-25 DIAGNOSIS — Z7952 Long term (current) use of systemic steroids: Secondary | ICD-10-CM | POA: Diagnosis not present

## 2017-05-25 DIAGNOSIS — Z79899 Other long term (current) drug therapy: Secondary | ICD-10-CM | POA: Diagnosis not present

## 2017-05-25 DIAGNOSIS — M329 Systemic lupus erythematosus, unspecified: Secondary | ICD-10-CM | POA: Diagnosis not present

## 2017-05-25 DIAGNOSIS — Z87891 Personal history of nicotine dependence: Secondary | ICD-10-CM

## 2017-05-25 DIAGNOSIS — Z8249 Family history of ischemic heart disease and other diseases of the circulatory system: Secondary | ICD-10-CM

## 2017-05-25 DIAGNOSIS — E119 Type 2 diabetes mellitus without complications: Secondary | ICD-10-CM | POA: Diagnosis not present

## 2017-05-25 DIAGNOSIS — E559 Vitamin D deficiency, unspecified: Secondary | ICD-10-CM

## 2017-05-25 DIAGNOSIS — E118 Type 2 diabetes mellitus with unspecified complications: Secondary | ICD-10-CM

## 2017-05-25 MED ORDER — PREDNISONE 5 MG PO TABS: 7.5000 mg | ORAL_TABLET | Freq: Every day | ORAL | 0 refills | Status: DC

## 2017-05-25 NOTE — Assessment & Plan Note (Signed)
HPI: Brings meter, has been checking AM sugars, most readings are in euglycemic range, some mildly elevated but all <180.  A: DM well controlled with diet  P - Continue PRN glucose checking, continue diet control - Referral placed for DM exam, optho

## 2017-05-25 NOTE — Progress Notes (Signed)
[facility] Subjective:  HPI: [name] is a 76 y.o. female who presents for f/u of Lupus  Please see Assessment and Plan below for the status of her chronic medical problems.  Review of Systems: Review of Systems  Constitutional: Negative for malaise/fatigue.  Eyes: Negative for blurred vision.  Respiratory: Negative for shortness of breath.   Cardiovascular: Negative for chest pain, palpitations and leg swelling.  Gastrointestinal: Negative for abdominal pain and diarrhea.  Genitourinary: Negative for frequency.  Musculoskeletal: Negative for falls.  Skin: Negative for rash.  Neurological: Negative for focal weakness and weakness.  Endo/Heme/Allergies: Negative for polydipsia.  Psychiatric/Behavioral: Negative for depression.    Objective:  Physical Exam: Vitals:   05/25/17 0953  BP: 123/71  Pulse: (!) 57  Temp: 97.9 F (36.6 C)  TempSrc: Oral  SpO2: 96%  Weight: 183 lb 3.2 oz (83.1 kg)  Height: 5' 10.5" (1.791 m)  Physical Exam  Constitutional: She is well-developed, well-nourished, and in no distress.  Eyes: Conjunctivae are normal.  Cardiovascular: An irregularly irregular rhythm present.  Pulmonary/Chest: Effort normal and breath sounds normal. No respiratory distress. She has no wheezes.  Musculoskeletal: She exhibits no edema.       Right shoulder: She exhibits normal range of motion, no tenderness, no swelling and no effusion.       Left shoulder: She exhibits normal range of motion, no swelling and no effusion.       Right wrist: She exhibits normal range of motion, no tenderness and no swelling.       Left wrist: She exhibits normal range of motion, no tenderness and no effusion.       Right knee: She exhibits normal range of motion, no swelling and no effusion.       Left knee: She exhibits normal range of motion, no swelling and no effusion.  Skin: Skin is warm.  Psychiatric: Affect and judgment normal.  Nursing note and  vitals reviewed.  Assessment & Plan:  Osteoporosis of forearm without pathological fracture HPI: Discussed DEXA results with patient, overall FRAX is low and Hip T score is in osteopenic range.  She still is on long term prednisone and has vitamin d deficiency. No fractures  A: Osteoporosis  P: Currently repleting Vitmain D, discussed possibility of restarting bisphosphonate therapy as she had some stability/improvement on bisphosphonate therapy but will first get Vit D levels up.   Diabetes ([place]) HPI: Brings meter, has been checking AM sugars, most readings are in euglycemic range, some mildly elevated but all <180.  A: DM well controlled with diet  P - Continue PRN glucose checking, continue diet control - Referral placed for DM exam, optho  Essential hypertension -Well controlled no issues.  On Losartan 100mg  daily and metoprolol 50mg  BID.  Vitamin D deficiency HPI: She has been taking 50,000IU of vitamin D2 weekly for last 7 weeks.  Has 5 pills left in prescription  A: Vitamin D deficiency  P: Recheck vitamin d level today Discussed transitioning after vitamin d2 is finished to 2000-5000 IU of VItamin D 3 daily.  Lupus ([place]) HPI: From EMR it appears there was some difficulty in reaching [place] to schedule appointemtn however she reports to me that she did call back and now has appointment for 10/26.  She has been taking 7.5mg  of prednisone daily, no worsening of symptoms on this dose.  She is having some mild right shoulder pain but that is chronic and worsened by overuse.  Otherwise she  reports she is doing "better" on the 7.5mg  dose.  A: Lupus  P: Continue prednisone 7.5mg  daily pending rheumatology evaluation.   Medications Ordered Meds ordered this encounter  Medications  . predniSONE (DELTASONE) 5 MG tablet    Sig: Take 1.5 tablets (7.5 mg total) by mouth daily.    Dispense:  135 tablet    Refill:  0    Place on file   Other Orders Orders Placed This  Encounter  Procedures  . Vitamin D (25 hydroxy)  . Ambulatory referral to Ophthalmology    Referral Priority:   Routine    Referral Type:   Consultation    Referral Reason:   Specialty Services Required    Requested Specialty:   Ophthalmology    Number of Visits Requested:   1   Follow Up: Return in about 6 months (around 11/25/2017), or if symptoms worsen or fail to improve.

## 2017-05-25 NOTE — Assessment & Plan Note (Signed)
HPI: From EMR it appears there was some difficulty in reaching [place] to schedule appointemtn however she reports to me that she did call back and now has appointment for 10/26.  She has been taking 7.5mg  of prednisone daily, no worsening of symptoms on this dose.  She is having some mild right shoulder pain but that is chronic and worsened by overuse.  Otherwise she reports she is doing "better" on the 7.5mg  dose.  A: Lupus  P: Continue prednisone 7.5mg  daily pending rheumatology evaluation.

## 2017-05-25 NOTE — Assessment & Plan Note (Signed)
HPI: Discussed DEXA results with patient, overall FRAX is low and Hip T score is in osteopenic range.  She still is on long term prednisone and has vitamin d deficiency. No fractures  A: Osteoporosis  P: Currently repleting Vitmain D, discussed possibility of restarting bisphosphonate therapy as she had some stability/improvement on bisphosphonate therapy but will first get Vit D levels up.

## 2017-05-25 NOTE — Assessment & Plan Note (Signed)
-  Well controlled no issues.  On Losartan 100mg  daily and metoprolol 50mg  BID.

## 2017-05-25 NOTE — Assessment & Plan Note (Signed)
HPI: She has been taking 50,000IU of vitamin D2 weekly for last 7 weeks.  Has 5 pills left in prescription  A: Vitamin D deficiency  P: Recheck vitamin d level today Discussed transitioning after vitamin d2 is finished to 2000-5000 IU of VItamin D 3 daily.

## 2017-05-25 NOTE — Patient Instructions (Signed)
I want you to continue taking the 50,000 units of Vitamin D2 once a week until you run out.  After that I would like you to switch to over the counter Vitamin D3.  Please pick up a bottle of between 2,000 to 5,000 units of Vitamin D3 to take daily.

## 2017-05-26 ENCOUNTER — Telehealth: Payer: Self-pay | Admitting: *Deleted

## 2017-05-26 ENCOUNTER — Encounter: Payer: Self-pay | Admitting: Internal Medicine

## 2017-05-26 LAB — VITAMIN D 25 HYDROXY (VIT D DEFICIENCY, FRACTURES): VIT D 25 HYDROXY: 34.2 ng/mL (ref 30.0–100.0)

## 2017-05-26 NOTE — Telephone Encounter (Signed)
5mg  eliquis given from samples, #14

## 2017-05-29 NOTE — Telephone Encounter (Signed)
Thank you Bonnita Nasuti, will work on it.

## 2017-07-07 ENCOUNTER — Other Ambulatory Visit: Payer: Self-pay | Admitting: Pharmacist

## 2017-07-07 DIAGNOSIS — I10 Essential (primary) hypertension: Secondary | ICD-10-CM

## 2017-07-07 MED ORDER — LOSARTAN POTASSIUM 100 MG PO TABS: 100.0000 mg | ORAL_TABLET | Freq: Every day | ORAL | 3 refills | Status: DC

## 2017-07-24 NOTE — Progress Notes (Deleted)
Office Visit Note  Patient: Stephanie Horne             Date of Birth: May 31, 1941           MRN: [id number]             PCP: Lucious Groves, DO Referring: Lucious Groves, DO Visit Date: 07/28/2017 Occupation: @GUAROCC @    Subjective:  No chief complaint on file.   History of Present Illness: Stephanie Horne is a 76 y.o. female ***   Activities of Daily Living:  Patient reports morning stiffness for *** {minute/hour:19697}.   Patient {ACTIONS;DENIES/REPORTS:21021675::"Denies"} nocturnal pain.  Difficulty dressing/grooming: {ACTIONS;DENIES/REPORTS:21021675::"Denies"} Difficulty climbing stairs: {ACTIONS;DENIES/REPORTS:21021675::"Denies"} Difficulty getting out of chair: {ACTIONS;DENIES/REPORTS:21021675::"Denies"} Difficulty using hands for taps, buttons, cutlery, and/or writing: {ACTIONS;DENIES/REPORTS:21021675::"Denies"}   No Rheumatology ROS completed.   PMFS History:  Patient Active Problem List   Diagnosis Date Noted  . Heart failure due to valvular disease, chronic, diastolic ([place]) 65/12/5463  . Abdominal aortic atherosclerosis ([place]) 04/11/2017  . Acute bronchitis 10/18/2016  . Allergic rhinitis 05/07/2015  . Pulmonary hypertension ([place]) 10/09/2014  . Severe mitral regurgitation 10/09/2014  . Osteoporosis of forearm without pathological fracture 05/29/2013  . Insomnia 05/29/2013  . Vitamin D deficiency 06/30/2011  . Diabetes ([place]) 02/27/2008  . History of autoimmune hemolytic anemia 10/12/2007  . Atrial fibrillation ([place]) 10/24/2006  . Hyperlipidemia associated with type 2 diabetes mellitus ([place]) 08/02/2006  . Essential hypertension 08/02/2006  . Lupus ([place]) 08/02/2006  . Low back pain 08/02/2006    Past Medical History:  Diagnosis Date  . AIHA (autoimmune hemolytic anemia) ([place])    12/08 onset  . Atrial fibrillation ([place])    not on coumidin because AIHA and major bleeding complications  . Axillary lymphadenopathy    left negative MMG 1/08  . Back pain,  chronic   . Complicated grief 6/81/2751   Death of husband in 01-12-2011   . Diabetes mellitus   . Hyperlipidemia   . Hypertension   . Lupus    Treated with prednisone indefinitely   . Mitral regurgitation   . Mitral regurgitation 10/24/2006   2d Echo from 2008/01/12 - EF 60%, Moderated MR    . Seasonal allergies     Family History  Problem Relation Age of Onset  . Heart disease Mother    Past Surgical History:  Procedure Laterality Date  . CARDIAC CATHETERIZATION N/A 01/07/2016   Procedure: Right/Left Heart Cath and Coronary Angiography;  Surgeon: [name], MD;  Location: [facility];  Service: Cardiovascular;  Laterality: N/A;  . EYE SURGERY     cataract in both eyes  . TEE WITHOUT CARDIOVERSION N/A 12/09/2015   Procedure: TRANSESOPHAGEAL ECHOCARDIOGRAM (TEE);  Surgeon: [name], MD;  Location: [facility];  Service: Cardiovascular;  Laterality: N/A;   Social History   Social History Narrative  . No narrative on file     Objective: Vital Signs: There were no vitals taken for this visit.   Physical Exam   Musculoskeletal Exam: ***  CDAI Exam: No CDAI exam completed.    Investigation: No additional findings. 04/16/2017 CBC normal, CMP potassium 5.3, ANA negative, DS DNA 11 (positive more than 9) 05/25/2017 vitamin D 34.2  Imaging: No results found.  Speciality Comments: No specialty comments available.    Procedures:  No procedures performed Allergies: Metformin and related; Lisinopril; and Pollen extract   Assessment / Plan:     Visit Diagnoses: No diagnosis found.    Orders: No orders of  the defined types were placed in this encounter.  No orders of the defined types were placed in this encounter.   Face-to-face time spent with patient was *** minutes. 50% of time was spent in counseling and coordination of care.  Follow-Up Instructions: No Follow-up on file.   Bo Merino, MD  Note - This record has been created using Radio producer.  Chart creation errors have been sought, but may not always  have been located. Such creation errors do not reflect on  the standard of medical care.

## 2017-07-28 ENCOUNTER — Ambulatory Visit: Payer: Self-pay | Admitting: Rheumatology

## 2017-09-08 ENCOUNTER — Other Ambulatory Visit: Payer: Self-pay | Admitting: *Deleted

## 2017-09-08 MED ORDER — TRAZODONE HCL 50 MG PO TABS: ORAL_TABLET | ORAL | 3 refills | Status: DC

## 2017-09-08 MED ORDER — PREDNISONE 5 MG PO TABS: 7.5000 mg | ORAL_TABLET | Freq: Every day | ORAL | 0 refills | Status: DC

## 2017-09-14 ENCOUNTER — Ambulatory Visit: Payer: Self-pay | Admitting: Rheumatology

## 2017-09-21 ENCOUNTER — Ambulatory Visit (INDEPENDENT_AMBULATORY_CARE_PROVIDER_SITE_OTHER): Payer: Medicare Other | Admitting: Internal Medicine

## 2017-09-21 ENCOUNTER — Encounter (INDEPENDENT_AMBULATORY_CARE_PROVIDER_SITE_OTHER): Payer: Self-pay

## 2017-09-21 ENCOUNTER — Ambulatory Visit (HOSPITAL_COMMUNITY)
Admission: RE | Admit: 2017-09-21 | Discharge: 2017-09-21 | Disposition: A | Discharge status: Home / Self Care | Payer: Medicare Other | Source: Ambulatory Visit | Attending: Internal Medicine | Admitting: Internal Medicine

## 2017-09-21 VITALS — BP 152/80 | HR 80 | Temp 98.6°F | Wt 187.8 lb

## 2017-09-21 DIAGNOSIS — D591 Other autoimmune hemolytic anemias: Secondary | ICD-10-CM | POA: Diagnosis not present

## 2017-09-21 DIAGNOSIS — I38 Endocarditis, valve unspecified: Secondary | ICD-10-CM | POA: Diagnosis not present

## 2017-09-21 DIAGNOSIS — I4891 Unspecified atrial fibrillation: Secondary | ICD-10-CM | POA: Diagnosis not present

## 2017-09-21 DIAGNOSIS — I288 Other diseases of pulmonary vessels: Secondary | ICD-10-CM | POA: Insufficient documentation

## 2017-09-21 DIAGNOSIS — R0602 Shortness of breath: Secondary | ICD-10-CM | POA: Diagnosis present

## 2017-09-21 DIAGNOSIS — I272 Pulmonary hypertension, unspecified: Secondary | ICD-10-CM

## 2017-09-21 DIAGNOSIS — I10 Essential (primary) hypertension: Secondary | ICD-10-CM

## 2017-09-21 DIAGNOSIS — I517 Cardiomegaly: Secondary | ICD-10-CM | POA: Insufficient documentation

## 2017-09-21 DIAGNOSIS — I5032 Chronic diastolic (congestive) heart failure: Principal | ICD-10-CM

## 2017-09-21 DIAGNOSIS — I340 Nonrheumatic mitral (valve) insufficiency: Secondary | ICD-10-CM

## 2017-09-21 DIAGNOSIS — Z9114 Patient's other noncompliance with medication regimen: Secondary | ICD-10-CM | POA: Diagnosis not present

## 2017-09-21 DIAGNOSIS — R918 Other nonspecific abnormal finding of lung field: Secondary | ICD-10-CM | POA: Insufficient documentation

## 2017-09-21 DIAGNOSIS — I700 Atherosclerosis of aorta: Secondary | ICD-10-CM | POA: Insufficient documentation

## 2017-09-21 DIAGNOSIS — J90 Pleural effusion, not elsewhere classified: Secondary | ICD-10-CM | POA: Diagnosis not present

## 2017-09-21 DIAGNOSIS — Z79899 Other long term (current) drug therapy: Secondary | ICD-10-CM | POA: Diagnosis not present

## 2017-09-21 DIAGNOSIS — M329 Systemic lupus erythematosus, unspecified: Secondary | ICD-10-CM | POA: Diagnosis not present

## 2017-09-21 DIAGNOSIS — I5033 Acute on chronic diastolic (congestive) heart failure: Secondary | ICD-10-CM | POA: Diagnosis not present

## 2017-09-21 DIAGNOSIS — Z862 Personal history of diseases of the blood and blood-forming organs and certain disorders involving the immune mechanism: Secondary | ICD-10-CM

## 2017-09-21 DIAGNOSIS — I110 Hypertensive heart disease with heart failure: Secondary | ICD-10-CM

## 2017-09-21 DIAGNOSIS — Z7901 Long term (current) use of anticoagulants: Secondary | ICD-10-CM | POA: Diagnosis not present

## 2017-09-21 DIAGNOSIS — R05 Cough: Secondary | ICD-10-CM | POA: Diagnosis not present

## 2017-09-21 LAB — CBC
HCT: 38.7 % (ref 36.0–46.0)
Hemoglobin: 12 g/dL (ref 12.0–15.0)
MCH: 31 pg (ref 26.0–34.0)
MCHC: 31 g/dL (ref 30.0–36.0)
MCV: 100 fL (ref 78.0–100.0)
PLATELETS: 139 10*3/uL — AB (ref 150–400)
RBC: 3.87 MIL/uL (ref 3.87–5.11)
RDW: 16 % — ABNORMAL HIGH (ref 11.5–15.5)
WBC: 4.7 10*3/uL (ref 4.0–10.5)

## 2017-09-21 LAB — COMPREHENSIVE METABOLIC PANEL
ALT: 14 U/L (ref 14–54)
AST: 27 U/L (ref 15–41)
Albumin: 3.8 g/dL (ref 3.5–5.0)
Alkaline Phosphatase: 65 U/L (ref 38–126)
Anion gap: 9 (ref 5–15)
BUN: 7 mg/dL (ref 6–20)
CHLORIDE: 112 mmol/L — AB (ref 101–111)
CO2: 22 mmol/L (ref 22–32)
CREATININE: 0.72 mg/dL (ref 0.44–1.00)
Calcium: 8.8 mg/dL — ABNORMAL LOW (ref 8.9–10.3)
Glucose, Bld: 168 mg/dL — ABNORMAL HIGH (ref 65–99)
POTASSIUM: 3.5 mmol/L (ref 3.5–5.1)
Sodium: 143 mmol/L (ref 135–145)
Total Bilirubin: 0.9 mg/dL (ref 0.3–1.2)
Total Protein: 6.8 g/dL (ref 6.5–8.1)

## 2017-09-21 LAB — BRAIN NATRIURETIC PEPTIDE: B NATRIURETIC PEPTIDE 5: 598.7 pg/mL — AB (ref 0.0–100.0)

## 2017-09-21 NOTE — Progress Notes (Signed)
   CC: Shortness of breath, swelling 2 months  HPI:  [name] is a 76 y.o. female with extensive medical history including chronic diastolic congestive heart failure, severe mitral regurg, atrial fibrillation, pulmonary hypertension, lupus and hypertension here with her daughter. Patient notes a 2 month history of shortness of breath and worsening bilateral lower extremity swelling. Patient is prescribed 40 mg Lasix daily however has not taken this medication in some time as she does not like to frequent to make trips to the bathroom. Chest notes that Lasix causes her to have diarrhea as well. She has shortness of breath with activity and does not experience it at rest.  Past Medical History:  Diagnosis Date  . AIHA (autoimmune hemolytic anemia) ([place])    12/08 onset  . Atrial fibrillation ([place])    not on coumidin because AIHA and major bleeding complications  . Axillary lymphadenopathy    left negative MMG 1/08  . Back pain, chronic   . Complicated grief 4/74/2595   Death of husband in 01/28/2011   . Diabetes mellitus   . Hyperlipidemia   . Hypertension   . Lupus    Treated with prednisone indefinitely   . Mitral regurgitation   . Mitral regurgitation 10/24/2006   2d Echo from 2008-01-28 - EF 60%, Moderated MR    . Seasonal allergies    Review of Systems:   General: Denies fevers, chills, weight loss HEENT: Denies sore throat, dysphagia Cardiac: +SOB with activity. Denies CP, palpitations Pulmonary: Denies cough, wheezes, PND Abd: Denies changes in bowels Extremities: +swelling  Physical Exam: General: Alert, in no acute distress. Pleasant and conversant. Sitting in wheelchair. HEENT: Prominent dark circles under eyes. No icterus, injection or ptosis.  Cardiac: Irregularly irregular, normal rate Pulmonary: Diminished breath sounds however grossly clear. Abd: Soft, non-tender. +bs Extremities: Warm, perfused. 2+ pitting edema bilateral lower extremities to knees    Vitals:   09/21/17 1419  BP: (!) 152/80  Pulse: 80  Temp: 98.6 F (37 C)  TempSrc: Oral  SpO2: 93%  Weight: 187 lb 12.8 oz (85.2 kg)   Assessment & Plan:   See Encounters Tab for problem based charting.  Patient seen with Dr. Angelia Mould

## 2017-09-21 NOTE — Assessment & Plan Note (Addendum)
Assessment: 76 year old female with chronic systolic heart failure noncompliant with her Lasix regimen due to frequent urination. She is here with a 2 month history of shortness of breath with activity  since discontinuing this medicine. she also endorses worsening bilateral lower extremity swelling during this time as well. She denies any fevers, chills, chest pain, cough, recent illness, sore throat or diarrhea. She does not have crackles on examination although she does appear grossly volume overloaded as evidenced by 2+ pitting edema to knees bilaterally. Oxygen 92% at rest and 88% percent with ambulation. Plan: Restart Lasix 40 mg twice daily 3 days then once daily following this. Patient instructed to return to clinic in 1 week for close follow-up. -Metabolic panel performed today shows intact renal function. -Discuss scheduled bathroom visits to reduce rushing to the restroom. Patient reassured that while she will be urinating quite frequently over the next few days, that will improve. -Ordered two-view chest x-ray  ADDENDUM: Metabolic panel with low-normal potassium. Calling daughter to update on adding potassium to treatment plan. Prescribing Kdur 14mEq tabs with instructions to take: -2 tablets (38mEq) while taking lasix twice daily -1 tablet (10 mEq) while taking lasix once daily -Please repeat BMET at follow-up in 1 week

## 2017-09-21 NOTE — Assessment & Plan Note (Signed)
Assessment: She has a history of autoimmune hemolytic anemia previously on 10 mg by mouth indefinitely. This is recently taper to 7.5 mg at her PCP appointment 7/18. Recurrent anemia could contribute to shortness of breath. Plan: CBC obtained today does not show any evidence for hemolysis. Shortness of breath likely explained by CHF exacerbation alone.

## 2017-09-21 NOTE — Progress Notes (Signed)
SATURATION QUALIFICATIONS:   Patient Saturations on Room Air at Rest = 92%  Patient Saturations on Room Air while Ambulating = 88%    Pt gave verbal consent to release any health info pertaining to her (the patient) to be given to her daughter [name] ph# [phone].  Family was given info packet on Advance Directives.[name]12/20/20184:15 PM

## 2017-09-21 NOTE — Patient Instructions (Addendum)
It was good seeing you today. I'm sorry you are short of breath.   I believe you are short of breath because of the extra fluid you have accumulated. This is likely because you have several conditions which cause this and you have been off your Lasix.  I understand it's frustrating to go to the bathroom so often, but I think it's important that we get the fluid off of you.  Please take Lasix 40mg  twice daily for 3 days, followed by 40mg  daily. For the first few days you will be urinating often, but this will get better as we get you back to a more normal volume status.  I am going to check some labs on you as well today. I am also ordering a chest x-ray.  Please follow-up here in the clinic in 1 week. Please call the clinic OR go to the ED if your symptoms worsen!

## 2017-09-22 ENCOUNTER — Telehealth: Payer: Self-pay | Admitting: Internal Medicine

## 2017-09-22 MED ORDER — FUROSEMIDE 40 MG PO TABS: ORAL_TABLET | ORAL | 1 refills | Status: DC

## 2017-09-22 MED ORDER — POTASSIUM CHLORIDE CRYS ER 10 MEQ PO TBCR: 10.0000 meq | EXTENDED_RELEASE_TABLET | Freq: Every day | ORAL | 2 refills | Status: DC

## 2017-09-22 NOTE — Addendum Note (Signed)
Addended by: Tamsen Roers on: 09/22/2017 11:38 AM   Modules accepted: Orders

## 2017-09-22 NOTE — Progress Notes (Signed)
Internal Medicine Clinic Attending  I saw and evaluated the patient.  I personally confirmed the key portions of the history and exam documented by Dr. Danford Bad and I reviewed pertinent patient test results.  The assessment, diagnosis, and plan were formulated together and I agree with the documentation in the resident's note. From exam suspected acute on chronic diastolic CHF exacerbation due to d/c of lasix (?due to diarrhea).  We discussed resumption of dosing and close follow up in 1 week.  Daughter was present for this.  If diarrhea resumes with lasix resumption would consider changing to torsemide to see if that improves diarrhea.

## 2017-09-22 NOTE — Telephone Encounter (Signed)
Called patients daughter, [name], who patient authorized to receive her medical information. The results of the labs and CXR. CXR indicated bibasilar pleural effusions, cardiomegaly and pulmonary arterial hypertension. There was no evidence for hemolysis and her BNP was elevated. Daughter was pleased about the results and noted her mother took her lasix last night. She tells me her breathing is the same as it was here in clinic. I encouraged her to encourage her mother to continue to take the lasix and to return to clinic in 1 week for check-in.

## 2017-09-28 ENCOUNTER — Ambulatory Visit (INDEPENDENT_AMBULATORY_CARE_PROVIDER_SITE_OTHER): Payer: Medicare Other | Admitting: Internal Medicine

## 2017-09-28 VITALS — BP 125/63 | HR 94 | Temp 97.7°F | Ht 70.5 in | Wt 176.8 lb

## 2017-09-28 DIAGNOSIS — M329 Systemic lupus erythematosus, unspecified: Secondary | ICD-10-CM

## 2017-09-28 DIAGNOSIS — R350 Frequency of micturition: Secondary | ICD-10-CM | POA: Diagnosis not present

## 2017-09-28 DIAGNOSIS — E119 Type 2 diabetes mellitus without complications: Secondary | ICD-10-CM

## 2017-09-28 DIAGNOSIS — I38 Endocarditis, valve unspecified: Secondary | ICD-10-CM | POA: Diagnosis not present

## 2017-09-28 DIAGNOSIS — I5032 Chronic diastolic (congestive) heart failure: Secondary | ICD-10-CM

## 2017-09-28 DIAGNOSIS — I272 Pulmonary hypertension, unspecified: Secondary | ICD-10-CM

## 2017-09-28 DIAGNOSIS — Z79899 Other long term (current) drug therapy: Secondary | ICD-10-CM | POA: Diagnosis not present

## 2017-09-28 DIAGNOSIS — Z7901 Long term (current) use of anticoagulants: Secondary | ICD-10-CM

## 2017-09-28 DIAGNOSIS — E785 Hyperlipidemia, unspecified: Secondary | ICD-10-CM | POA: Diagnosis not present

## 2017-09-28 DIAGNOSIS — I482 Chronic atrial fibrillation: Secondary | ICD-10-CM

## 2017-09-28 DIAGNOSIS — I110 Hypertensive heart disease with heart failure: Secondary | ICD-10-CM | POA: Diagnosis not present

## 2017-09-28 DIAGNOSIS — R011 Cardiac murmur, unspecified: Secondary | ICD-10-CM

## 2017-09-28 DIAGNOSIS — I340 Nonrheumatic mitral (valve) insufficiency: Secondary | ICD-10-CM | POA: Diagnosis not present

## 2017-09-28 NOTE — Patient Instructions (Addendum)
It was a pleasure to see you Stephanie Horne.  I am glad you are feeling better.  Please continue your Furosemide (lasix) 40 mg once a day.  Continue your Potassium supplement once daily.  Continue your other medications as prescribed.  We are checking blood work today to assess your kidney and potassium levels.  Please try to check your weights at home daily. - Please call us if you see your weight go up 3 lbs in one day or 5 lbs in one week  Please follow up with [place] again in about 4 weeks and then again with Stephanie Horne as scheduled on 11/23/17. Or see [place] sooner if needed.

## 2017-09-28 NOTE — Assessment & Plan Note (Addendum)
Ms. Haseman was last seen in clinic on 09/21/17 with 2 months symptoms of dyspnea and lower extremity edema in the setting of stopping Lasix. She was thought to be in acute on chronic diastolic CHF exacerbation and advised to resume her lasix at 40 mg twice daily for 3 days then once daily afterwards. Her weight was 187 lbs.  Lab work was notable for a BNP of 598.7, K 3.5, and creatinine of 0.72. She was started on potassium supplementation with lasix. She had a 2 view CXR on 09/22/17 which showed cardiomegaly and bilateral pleural effusions.  She returns today for follow up of her heart failure. She reports feeling much better without any symptoms of dyspnea and says her leg swelling is essentially gone. She reports adherence to Lasix 40 mg once daily and KDur 10 mEq once daily. Her weight this visit is 176 lbs. She denies any chest pain, palpitations, or feeling more thirsty than usual. A/P: Patient's chronic diastolic heart failure is stable without acute exacerbation. She has trace lower extremity edema at her ankles and faint crackles in her left lower lung field. She is tolerating the lasix well as far as voiding in the 6-8 hours after she takes it. She does not have a scale at home but will try to obtain one.  - Continue Lasix 40 mg daily - Continue KDur 10 mEq daily - Continue Losartan 100 mg daily - Continue Lopressor 50 mg BID - Check BMET today; replete/increase K supplement if needed; consider checking Mg if persistently low - Check weights at home; call if weight increases 3 lbs in one day or 5 lbs in a week - f/u in about 4 weeks for a recheck and again with PCP as scheduled

## 2017-09-28 NOTE — Progress Notes (Signed)
CC: CHF  HPI:  Stephanie Horne is a 76 y.o. female with PMH as listed below including Chronic Diastolic CHF (EF 14-97%) with severe Mitral Regurgitation and moderate pulmonary HTN, Permanent Afib on Eliquis, T2DM, HTN, HLD, and Lupus who presents for follow up management of her heart failure. She is accompanied by her daughter Stephanie Horne.  Heart failure due to valvular disease, chronic, diastolic ([place]) Stephanie Horne was last seen in clinic on 09/21/17 with 2 months symptoms of dyspnea and lower extremity edema in the setting of stopping Lasix. She was thought to be in acute on chronic diastolic CHF exacerbation and advised to resume her lasix at 40 mg twice daily for 3 days then once daily afterwards. Her weight was 187 lbs.  Lab work was notable for a BNP of 598.7, K 3.5, and creatinine of 0.72. She was started on potassium supplementation with lasix. She had a 2 view CXR on 09/22/17 which showed cardiomegaly and bilateral pleural effusions.  She returns today for follow up of her heart failure. She reports feeling much better without any symptoms of dyspnea and says her leg swelling is essentially gone. She reports adherence to Lasix 40 mg once daily and KDur 10 mEq once daily. Her weight this visit is 176 lbs. She denies any chest pain, palpitations, or feeling more thirsty than usual. A/P: Patient's chronic diastolic heart failure is stable without acute exacerbation. She has trace lower extremity edema at her ankles and faint crackles in her left lower lung field. She is tolerating the lasix well as far as voiding in the 6-8 hours after she takes it. She does not have a scale at home but will try to obtain one.  - Continue Lasix 40 mg daily - Continue KDur 10 mEq daily - Continue Losartan 100 mg daily - Continue Lopressor 50 mg BID - Check BMET today; replete/increase K supplement if needed; consider checking Mg if persistently low - Check weights at home; call if weight increases 3 lbs in one  day or 5 lbs in a week - f/u in about 4 weeks for a recheck and again with PCP as scheduled      Past Medical History:  Diagnosis Date  . AIHA (autoimmune hemolytic anemia) ([place])    12/08 onset  . Atrial fibrillation ([place])    not on coumidin because AIHA and major bleeding complications  . Axillary lymphadenopathy    left negative MMG 1/08  . Back pain, chronic   . Complicated grief 0/26/3785   Death of husband in 01-21-2011   . Diabetes mellitus   . Hyperlipidemia   . Hypertension   . Lupus    Treated with prednisone indefinitely   . Mitral regurgitation   . Mitral regurgitation 10/24/2006   2d Echo from 2008/01/21 - EF 60%, Moderated MR    . Seasonal allergies    Review of Systems:   Review of Systems  Constitutional: Negative for fever.  Respiratory: Negative for shortness of breath.   Cardiovascular: Negative for chest pain, palpitations and leg swelling.  Genitourinary: Positive for frequency. Negative for dysuria and urgency.  Musculoskeletal: Negative for falls and myalgias.  Neurological: Negative for loss of consciousness.     Physical Exam:  Vitals:   09/28/17 1544  BP: 125/63  Pulse: 94  Temp: 97.7 F (36.5 C)  TempSrc: Oral  SpO2: 96%  Weight: 176 lb 12.8 oz (80.2 kg)  Height: 5' 10.5" (1.791 m)   Physical Exam  Constitutional: She is oriented  to person, place, and time. No distress.  Resting in a wheelchair  Cardiovascular:  Irregular rhythm, borderline tachycardic, systolic murmur at left sternal border  Pulmonary/Chest: Effort normal. No respiratory distress. She has no wheezes.  Faint inspiratory crackles left lower lung field  Abdominal: Soft. She exhibits no distension.  Musculoskeletal:  Trace edema at both ankles  Neurological: She is alert and oriented to person, place, and time.  Skin: Skin is warm. She is not diaphoretic.    Assessment & Plan:   See Encounters Tab for problem based charting.  Patient discussed with Dr. Rebeca Alert

## 2017-09-29 LAB — BMP8+ANION GAP
Anion Gap: 20 mmol/L — ABNORMAL HIGH (ref 10.0–18.0)
BUN/Creatinine Ratio: 17 (ref 12–28)
BUN: 14 mg/dL (ref 8–27)
CO2: 28 mmol/L (ref 20–29)
Calcium: 9.3 mg/dL (ref 8.7–10.3)
Chloride: 100 mmol/L (ref 96–106)
Creatinine, Ser: 0.84 mg/dL (ref 0.57–1.00)
GFR calc Af Amer: 77 mL/min/{1.73_m2} (ref >59)
GFR calc non Af Amer: 67 mL/min/{1.73_m2} (ref >59)
Glucose: 218 mg/dL — ABNORMAL HIGH (ref 65–99)
Potassium: 3 mmol/L — ABNORMAL LOW (ref 3.5–5.2)
Sodium: 148 mmol/L — ABNORMAL HIGH (ref 134–144)

## 2017-09-29 NOTE — Progress Notes (Signed)
Internal Medicine Clinic Attending  Case discussed with Dr. Rathore  at the time of the visit.  We reviewed the resident's history and exam and pertinent patient test results.  I agree with the assessment, diagnosis, and plan of care documented in the resident's note.  Alexander N Raines, MD  

## 2017-10-04 ENCOUNTER — Other Ambulatory Visit: Payer: Self-pay | Admitting: Internal Medicine

## 2017-10-04 DIAGNOSIS — I5032 Chronic diastolic (congestive) heart failure: Principal | ICD-10-CM

## 2017-10-04 DIAGNOSIS — I38 Endocarditis, valve unspecified: Secondary | ICD-10-CM

## 2017-10-04 MED ORDER — POTASSIUM CHLORIDE CRYS ER 20 MEQ PO TBCR: 20.0000 meq | EXTENDED_RELEASE_TABLET | Freq: Every day | ORAL | 0 refills | Status: DC

## 2017-10-05 ENCOUNTER — Other Ambulatory Visit: Payer: Self-pay

## 2017-10-05 DIAGNOSIS — E119 Type 2 diabetes mellitus without complications: Secondary | ICD-10-CM

## 2017-10-05 NOTE — Telephone Encounter (Signed)
glucose blood (ONE TOUCH ULTRA TEST) test strip, refill request @ [facility] on [address].

## 2017-10-06 ENCOUNTER — Other Ambulatory Visit: Payer: Self-pay | Admitting: Internal Medicine

## 2017-10-06 DIAGNOSIS — E119 Type 2 diabetes mellitus without complications: Secondary | ICD-10-CM

## 2017-10-06 MED ORDER — GLUCOSE BLOOD VI STRP: ORAL_STRIP | 3 refills | Status: DC

## 2017-10-06 MED ORDER — ONETOUCH DELICA LANCETS FINE MISC: 1.0000 | Freq: Every day | 3 refills | Status: DC

## 2017-10-06 NOTE — Telephone Encounter (Signed)
Patient requesting test strips sent to [facility]

## 2017-10-09 NOTE — Telephone Encounter (Signed)
Rx sent to [facility] 10/06/17, [facility] wanted to [facility]. Patient's dtr [name] [phone]) called & stated [facility] can not dispense. Called [facility], was informed 1st shipment sent today & will be received w/in 4-5 days. [facility] will need to call insurance helpdesk [phone] to request short supply override. Informed patient's dtr.  Also cancelled the rest of test strips from [facility], dtr agreed.

## 2017-11-23 ENCOUNTER — Telehealth: Payer: Self-pay | Admitting: Internal Medicine

## 2017-11-23 ENCOUNTER — Ambulatory Visit (INDEPENDENT_AMBULATORY_CARE_PROVIDER_SITE_OTHER): Payer: Medicare Other | Admitting: Internal Medicine

## 2017-11-23 ENCOUNTER — Encounter: Payer: Self-pay | Admitting: Internal Medicine

## 2017-11-23 ENCOUNTER — Encounter (INDEPENDENT_AMBULATORY_CARE_PROVIDER_SITE_OTHER): Payer: Self-pay

## 2017-11-23 ENCOUNTER — Other Ambulatory Visit: Payer: Self-pay

## 2017-11-23 VITALS — BP 124/73 | HR 70 | Temp 97.8°F | Ht 70.0 in | Wt 172.3 lb

## 2017-11-23 DIAGNOSIS — I482 Chronic atrial fibrillation: Secondary | ICD-10-CM

## 2017-11-23 DIAGNOSIS — I38 Endocarditis, valve unspecified: Secondary | ICD-10-CM

## 2017-11-23 DIAGNOSIS — I700 Atherosclerosis of aorta: Secondary | ICD-10-CM

## 2017-11-23 DIAGNOSIS — E118 Type 2 diabetes mellitus with unspecified complications: Secondary | ICD-10-CM

## 2017-11-23 DIAGNOSIS — R05 Cough: Secondary | ICD-10-CM | POA: Diagnosis not present

## 2017-11-23 DIAGNOSIS — R413 Other amnesia: Secondary | ICD-10-CM | POA: Diagnosis not present

## 2017-11-23 DIAGNOSIS — I5032 Chronic diastolic (congestive) heart failure: Secondary | ICD-10-CM | POA: Diagnosis not present

## 2017-11-23 DIAGNOSIS — R011 Cardiac murmur, unspecified: Secondary | ICD-10-CM

## 2017-11-23 DIAGNOSIS — Z7984 Long term (current) use of oral hypoglycemic drugs: Secondary | ICD-10-CM

## 2017-11-23 DIAGNOSIS — H9193 Unspecified hearing loss, bilateral: Secondary | ICD-10-CM

## 2017-11-23 DIAGNOSIS — E119 Type 2 diabetes mellitus without complications: Secondary | ICD-10-CM | POA: Diagnosis not present

## 2017-11-23 DIAGNOSIS — I110 Hypertensive heart disease with heart failure: Secondary | ICD-10-CM

## 2017-11-23 DIAGNOSIS — I2722 Pulmonary hypertension due to left heart disease: Secondary | ICD-10-CM

## 2017-11-23 DIAGNOSIS — I4821 Permanent atrial fibrillation: Secondary | ICD-10-CM

## 2017-11-23 DIAGNOSIS — H9190 Unspecified hearing loss, unspecified ear: Secondary | ICD-10-CM | POA: Insufficient documentation

## 2017-11-23 DIAGNOSIS — G309 Alzheimer's disease, unspecified: Secondary | ICD-10-CM | POA: Insufficient documentation

## 2017-11-23 DIAGNOSIS — F0280 Dementia in other diseases classified elsewhere without behavioral disturbance: Secondary | ICD-10-CM | POA: Insufficient documentation

## 2017-11-23 DIAGNOSIS — R052 Subacute cough: Secondary | ICD-10-CM

## 2017-11-23 DIAGNOSIS — I272 Pulmonary hypertension, unspecified: Secondary | ICD-10-CM

## 2017-11-23 DIAGNOSIS — Z79899 Other long term (current) drug therapy: Secondary | ICD-10-CM

## 2017-11-23 DIAGNOSIS — I2510 Atherosclerotic heart disease of native coronary artery without angina pectoris: Secondary | ICD-10-CM | POA: Diagnosis not present

## 2017-11-23 DIAGNOSIS — I10 Essential (primary) hypertension: Secondary | ICD-10-CM

## 2017-11-23 DIAGNOSIS — Z7901 Long term (current) use of anticoagulants: Secondary | ICD-10-CM

## 2017-11-23 DIAGNOSIS — I340 Nonrheumatic mitral (valve) insufficiency: Secondary | ICD-10-CM

## 2017-11-23 DIAGNOSIS — E1165 Type 2 diabetes mellitus with hyperglycemia: Secondary | ICD-10-CM

## 2017-11-23 DIAGNOSIS — F0390 Unspecified dementia without behavioral disturbance: Secondary | ICD-10-CM

## 2017-11-23 LAB — GLUCOSE, CAPILLARY: GLUCOSE-CAPILLARY: 165 mg/dL — AB (ref 65–99)

## 2017-11-23 LAB — POCT GLYCOSYLATED HEMOGLOBIN (HGB A1C): Hemoglobin A1C: 8.6

## 2017-11-23 MED ORDER — METFORMIN HCL ER 500 MG PO TB24: 500.0000 mg | ORAL_TABLET | Freq: Two times a day (BID) | ORAL | 5 refills | Status: DC

## 2017-11-23 NOTE — Assessment & Plan Note (Signed)
HPI: No current SOB.  A: Pulmonary HTN secondary to severe mitral regurg.  P: Continue to follow up cardiology, however not a surgical candidate.

## 2017-11-23 NOTE — Assessment & Plan Note (Signed)
Continue pravastatin 40 mg daily. 

## 2017-11-23 NOTE — Assessment & Plan Note (Signed)
-  Chronic and stable. Continue current management follows with Dr Burt Knack, not a surgical candidate for valve replacment

## 2017-11-23 NOTE — Assessment & Plan Note (Signed)
HPI: No complaints, reports adherence to meds  A: Essential HTN at goal  P:  Metoprolol 50mg  BID Losartan 100mg  daily

## 2017-11-23 NOTE — Assessment & Plan Note (Signed)
HPI: Was diet controlled.  Reports history of "diabetic coma" many years ago.  Has had diarrhea with metformin.  Brings meter, usually checking once per day in AM range 80-160.  No polydipsia or polyuria.  A: Type 2 DM uncontrolled  P: - Repeat A1c 8.6.  Discussed starting metformin, will try XR to see if this causes any SE.

## 2017-11-23 NOTE — Assessment & Plan Note (Signed)
HPI: Follows with Dr Burt Knack, no complaints. On eliquis, also taking aspirin.  A: Permanent A fib 2/2 severe mitral regurg  P: Continue eliquis (per Dr Burt Knack, unable to stay therapeutic on warfarin) Discussed may discontinue ASA (mild non obstructive CAD)

## 2017-11-23 NOTE — Assessment & Plan Note (Signed)
-  Continue risk factor modification.  No need to ASA, on eliqis for Afib.

## 2017-11-23 NOTE — Assessment & Plan Note (Signed)
HPI: duaghter concerned for hearing gradual hearing loss.  On exam appears mild bilateral decreased hearing.  A: Hearing loss  P:Referral to audiology for formal testing.

## 2017-11-23 NOTE — Progress Notes (Signed)
Subjective:  HPI: [name] is a 77 y.o. female who presents for follow up of DM and HTN.  She is accompanied by her Daughter Stephanie Horne  Please see Assessment and Plan below for the status of her chronic medical problems.  Review of Systems: Review of Systems  Constitutional: Negative for fever.  Eyes: Negative for blurred vision.  Respiratory: Negative for cough.   Cardiovascular: Negative for chest pain and claudication.  Gastrointestinal: Negative for blood in stool.  Genitourinary: Negative for dysuria, flank pain and hematuria.  Musculoskeletal: Negative for falls and myalgias.  Neurological: Negative for dizziness.    Objective:  Physical Exam: Vitals:   11/23/17 1117  BP: 124/73  Pulse: 70  Temp: 97.8 F (36.6 C)  TempSrc: Oral  SpO2: 96%  Weight: 172 lb 4.8 oz (78.2 kg)  Height: 5\' 10"  (1.778 m)   Physical Exam  Constitutional: She is well-developed, well-nourished, and in no distress.  HENT:  Right Ear: External ear normal. Decreased hearing is noted.  Left Ear: External ear normal. Decreased hearing is noted.  Mouth/Throat: Oropharynx is clear and moist.  Cardiovascular: An irregularly irregular rhythm present.  Murmur heard. Pulmonary/Chest: Breath sounds normal. She has no wheezes. She has no rales.  Abdominal: Soft. Bowel sounds are normal.  Musculoskeletal: She exhibits no edema.  Lymphadenopathy:    She has no cervical adenopathy.  Nursing note and vitals reviewed.  Assessment & Plan:  Essential hypertension HPI: No complaints, reports adherence to meds  A: Essential HTN at goal  P:  Metoprolol 50mg  BID Losartan 100mg  daily  Atrial fibrillation ([facility]) HPI: Follows with Dr Burt Knack, no complaints. On eliquis, also taking aspirin.  A: Permanent A fib 2/2 severe mitral regurg  P: Continue eliquis (per Dr Burt Knack, unable to stay therapeutic on warfarin) Discussed may discontinue ASA (mild non obstructive CAD)  Pulmonary hypertension  ([place]) HPI: No current SOB.  A: Pulmonary HTN secondary to severe mitral regurg.  P: Continue to follow up cardiology, however not a surgical candidate.  Coronary artery disease, non-occlusive -Continue risk factor modification.  No need to ASA, on eliqis for Afib.  Abdominal aortic atherosclerosis ([facility]) -Continue pravastatin 40mg  daily  Heart failure due to valvular disease, chronic, diastolic ([facility]) -Chronic and stable. Continue current management follows with Dr Burt Knack, not a surgical candidate for valve replacment  Type 2 diabetes mellitus, uncontrolled ([place]) HPI: Was diet controlled.  Reports history of "diabetic coma" many years ago.  Has had diarrhea with metformin.  Brings meter, usually checking once per day in AM range 80-160.  No polydipsia or polyuria.  A: Type 2 DM uncontrolled  P: - Repeat A1c 8.6.  Discussed starting metformin, will try XR to see if this causes any SE.    Subacute cough HPI :reports 4 wk history of cough. Sometimes productive of clear sputum.  No fever, chills or myalgias.  Started during viral illness, not completely resolved.    A: Subacute cough  P: Likely post viral discussed supportive care, obtain CXR if not resolved within next month.  Impaired memory Daugher concerned for potential memory loss, unfortunately, we covered a lot of topics and ran out of time before this was addressed further. I called daughter Stephanie Horne back after visit and discussed we can do a MOCA formal assessment at the [facility] when she schedules that.   Medications Ordered Meds ordered this encounter  Medications  . metFORMIN (GLUCOPHAGE XR) 500 MG 24 hr tablet    Sig: Take 1 tablet (500 mg  total) by mouth 2 (two) times daily.    Dispense:  60 tablet    Refill:  5   Other Orders Orders Placed This Encounter  Procedures  . Glucose, capillary  . Ambulatory referral to Audiology    Referral Priority:   Routine    Referral Type:   Audiology Exam    Referral Reason:    Specialty Services Required    Number of Visits Requested:   1  . POC Hbg A1C   Follow Up: Return in about 6 months (around 05/23/2018), or [facility] anytime that works for her schedule.

## 2017-11-23 NOTE — Assessment & Plan Note (Signed)
Daugher concerned for potential memory loss, unfortunately, we covered a lot of topics and ran out of time before this was addressed further. I called [name] back after visit and discussed we can do a MOCA formal assessment at the [facility] when she schedules that.

## 2017-11-23 NOTE — Assessment & Plan Note (Signed)
HPI :reports 4 wk history of cough. Sometimes productive of clear sputum.  No fever, chills or myalgias.  Started during viral illness, not completely resolved.    A: Subacute cough  P: Likely post viral discussed supportive care, obtain CXR if not resolved within next month.

## 2017-11-23 NOTE — Telephone Encounter (Signed)
Patient saying someone call her phone, she was checking. I didn't see any notes

## 2017-11-27 NOTE — Progress Notes (Signed)
Office Visit Note  Patient: Stephanie Horne             Date of Birth: October 22, 1940           MRN: [id number]             PCP: Lucious Groves, DO Referring: Lucious Groves, DO Visit Date: 12/11/2017 Occupation: @GUAROCC @    Subjective: Abnormal lab.   History of Present Illness: Stephanie Horne is a 77 y.o. female seen in consultation per her PCP for evaluation of positive double-stranded DNA.  Patient does not recall being treated by Dr. Justine Null in the past.  She has any joint pain or joint swelling.  She denies any history of oral ulcers, nasal ulcers, raynauds phenomenon.  Activities of Daily Living:  Patient reports morning stiffness for 0 minute.   Patient Denies nocturnal pain.  Difficulty dressing/grooming: Reports Difficulty climbing stairs: Reports Difficulty getting out of chair: Reports Difficulty using hands for taps, buttons, cutlery, and/or writing: Denies   Review of Systems  Constitutional: Negative for fatigue, night sweats, weight gain, weight loss and weakness.  HENT: Negative for mouth sores, trouble swallowing, trouble swallowing, mouth dryness and nose dryness.   Eyes: Negative for pain, redness, visual disturbance and dryness.  Respiratory: Negative for cough, shortness of breath and difficulty breathing.   Cardiovascular: Positive for hypertension. Negative for chest pain, palpitations, irregular heartbeat and swelling in legs/feet.  Gastrointestinal: Negative for blood in stool, constipation and diarrhea.  Endocrine: Negative for increased urination.  Genitourinary: Negative for vaginal dryness.  Musculoskeletal: Negative for arthralgias, joint pain, joint swelling, myalgias, muscle weakness, morning stiffness, muscle tenderness and myalgias.  Skin: Negative for color change, rash, hair loss, skin tightness, ulcers and sensitivity to sunlight.  Allergic/Immunologic: Negative for susceptible to infections.  Neurological: Positive for memory loss. Negative  for dizziness and night sweats.  Hematological: Negative for swollen glands.  Psychiatric/Behavioral: Negative for depressed mood and sleep disturbance. The patient is not nervous/anxious.     PMFS History:  Patient Active Problem List   Diagnosis Date Noted  . Hearing loss 11/23/2017  . Coronary artery disease, non-occlusive 11/23/2017  . Impaired memory 11/23/2017  . Heart failure due to valvular disease, chronic, diastolic ([place]) 74/25/9563  . Abdominal aortic atherosclerosis ([place]) 04/11/2017  . Allergic rhinitis 05/07/2015  . Pulmonary hypertension ([place]) 10/09/2014  . Severe mitral regurgitation 10/09/2014  . Osteoporosis of forearm without pathological fracture 05/29/2013  . Insomnia 05/29/2013  . Subacute cough 02/22/2012  . Vitamin D deficiency 06/30/2011  . Type 2 diabetes mellitus, uncontrolled ([place]) 02/27/2008  . History of autoimmune hemolytic anemia 10/12/2007  . Atrial fibrillation ([place]) 10/24/2006  . Hyperlipidemia associated with type 2 diabetes mellitus ([place]) 08/02/2006  . Essential hypertension 08/02/2006  . Lupus ([place]) 08/02/2006  . Low back pain 08/02/2006    Past Medical History:  Diagnosis Date  . AIHA (autoimmune hemolytic anemia) ([place])    12/08 onset  . Atrial fibrillation ([place])    not on coumidin because AIHA and major bleeding complications  . Axillary lymphadenopathy    left negative MMG 1/08  . Back pain, chronic   . Complicated grief 8/75/6433   Death of husband in 01/26/2011   . Diabetes mellitus   . Hyperlipidemia   . Hypertension   . Lupus    Treated with prednisone indefinitely   . Mitral regurgitation   . Mitral regurgitation 10/24/2006   2d Echo from 26-Jan-2008 - EF 60%, Moderated MR    .  Seasonal allergies     Family History  Problem Relation Age of Onset  . Heart disease Mother   . Heart disease Sister   . Heart disease Brother   . Heart disease Brother   . Diabetes Daughter   . Lupus Daughter    Past Surgical History:  Procedure  Laterality Date  . CARDIAC CATHETERIZATION N/A 01/07/2016   Procedure: Right/Left Heart Cath and Coronary Angiography;  Surgeon: [name], MD;  Location: [facility];  Service: Cardiovascular;  Laterality: N/A;  . EYE SURGERY     cataract in both eyes  . TEE WITHOUT CARDIOVERSION N/A 12/09/2015   Procedure: TRANSESOPHAGEAL ECHOCARDIOGRAM (TEE);  Surgeon: [name], MD;  Location: [facility] ENDOSCOPY;  Service: Cardiovascular;  Laterality: N/A;   Social History   Social History Narrative  . Not on file     Objective: Vital Signs: BP 128/79 (BP Location: Left Arm, Patient Position: Sitting, Cuff Size: Normal)   Pulse 74   Resp 17   Ht 5\' 10"  (1.778 m)   Wt 163 lb (73.9 kg)   BMI 23.39 kg/m    Physical Exam  Constitutional: She is oriented to person, place, and time. She appears well-developed and well-nourished.  HENT:  Head: Normocephalic and atraumatic.  Eyes: Conjunctivae and EOM are normal.  Neck: Normal range of motion.  Cardiovascular: Normal rate, regular rhythm and intact distal pulses.  Murmur heard. Pulmonary/Chest: Effort normal and breath sounds normal.  Abdominal: Soft. Bowel sounds are normal.  Lymphadenopathy:    She has no cervical adenopathy.  Neurological: She is alert and oriented to person, place, and time.  Skin: Skin is warm and dry. Capillary refill takes less than 2 seconds.  Psychiatric: She has a normal mood and affect. Her behavior is normal.  Nursing note and vitals reviewed.    Musculoskeletal Exam: C-spine thoracic and lumbar spine limited range of motion.  Shoulder joints elbows joints wrist joint MCPs PIPs DIPs are good range of motion with no synovitis.  Hip joint examination was difficult to perform as she has limited range of motion.  Knee joints are good range of motion.  CDAI Exam: No CDAI exam completed.    Investigation: No additional findings.  Component     Latest Ref Rng & Units 04/06/2017  ANA Titer 1      Negative    dsDNA Ab     0 - 9 IU/mL 11 (H)   CBC Latest Ref Rng & Units 09/21/2017 04/06/2017 05/04/2016  WBC 4.0 - 10.5 K/uL 4.7 6.4 3.7(L)  Hemoglobin 12.0 - 15.0 g/dL 12.0 15.5 12.7  Hematocrit 36.0 - 46.0 % 38.7 46.3 38.5  Platelets 150 - 400 K/uL 139(L) 158 139(L)   CMP Latest Ref Rng & Units 09/28/2017 09/21/2017 04/06/2017  Glucose 65 - 99 mg/dL 218(H) 168(H) 142(H)  BUN 8 - 27 mg/dL 14 7 13   Creatinine 0.57 - 1.00 mg/dL 0.84 0.72 0.87  Sodium 134 - 144 mmol/L 148(H) 143 139  Potassium 3.5 - 5.2 mmol/L 3.0(L) 3.5 5.3(H)  Chloride 96 - 106 mmol/L 100 112(H) 102  CO2 20 - 29 mmol/L 28 22 18(L)  Calcium 8.7 - 10.3 mg/dL 9.3 8.8(L) 9.7  Total Protein 6.5 - 8.1 g/dL - 6.8 -  Total Bilirubin 0.3 - 1.2 mg/dL - 0.9 -  Alkaline Phos 38 - 126 U/L - 65 -  AST 15 - 41 U/L - 27 -  ALT 14 - 54 U/L - 14 -  Imaging: No results found.  Speciality Comments: No specialty comments available.    Procedures:  No procedures performed Allergies: Metformin and related; Lisinopril; and Pollen extract   Assessment / Plan:     Visit Diagnoses: Positive double stranded DNA antibody test -ANA negative.  According to patient she was diagnosed with lupus in the past.  She was seen by Dr. Justine Null in the past.  Patient states that she might have been treated with some medications in the past but she has not had any treatment for multiple years.  She has no clinical features of lupus on examination.  She denies any history of fatigue, oral ulcers, nasal ulcers, inflammatory arthritis, Raynaud's phenomenon or photosensitivity.  I would do some additional labs which are listed below.  Once the labs are available I can call her back with the results.    Autoimmune hemolytic anemia (HCC) - Previously on Imuran.She is on Prednisone 7.5 mg daily.  On prednisone therapy - Prednisone 7.5 mg daily for hemolytic anemia.  Permanent atrial fibrillation (HCC) - On Eliquis  Severe mitral regurgitation  Pulmonary hypertension  (HCC)  Heart failure due to valvular disease, chronic, diastolic (HCC) - Followed by Dr. Burt Knack  History of type 2 diabetes mellitus    Orders: Orders Placed This Encounter  Procedures  . Urinalysis, Routine w reflex microscopic  . RNP Antibody  . Anti-Smith antibody  . Sjogrens syndrome-A extractable nuclear antibody  . Sjogrens syndrome-B extractable nuclear antibody  . Beta-2 glycoprotein antibodies  . Cardiolipin antibodies, IgG, IgM, IgA  . Lupus Anticoagulant Eval w/Reflex  . C3 and C4   No orders of the defined types were placed in this encounter.   Face-to-face time spent with patient was 30 minutes.  Greater than 50% of time was spent in counseling and coordination of care.  Follow-Up Instructions: No Follow-up on file.   Bo Merino, MD  Note - This record has been created using Editor, commissioning.  Chart creation errors have been sought, but may not always  have been located. Such creation errors do not reflect on  the standard of medical care.

## 2017-12-06 DIAGNOSIS — H04123 Dry eye syndrome of bilateral lacrimal glands: Secondary | ICD-10-CM | POA: Diagnosis not present

## 2017-12-06 DIAGNOSIS — H43813 Vitreous degeneration, bilateral: Secondary | ICD-10-CM | POA: Diagnosis not present

## 2017-12-06 DIAGNOSIS — E119 Type 2 diabetes mellitus without complications: Secondary | ICD-10-CM | POA: Diagnosis not present

## 2017-12-06 DIAGNOSIS — H52203 Unspecified astigmatism, bilateral: Secondary | ICD-10-CM | POA: Diagnosis not present

## 2017-12-06 LAB — HM DIABETES EYE EXAM

## 2017-12-08 ENCOUNTER — Encounter: Payer: Self-pay | Admitting: *Deleted

## 2017-12-11 ENCOUNTER — Encounter: Payer: Self-pay | Admitting: Cardiovascular Disease

## 2017-12-11 ENCOUNTER — Encounter: Payer: Self-pay | Admitting: Rheumatology

## 2017-12-11 ENCOUNTER — Ambulatory Visit: Payer: Medicare Other | Admitting: Cardiovascular Disease

## 2017-12-11 ENCOUNTER — Ambulatory Visit: Payer: Medicare Other | Admitting: Rheumatology

## 2017-12-11 VITALS — BP 128/79 | HR 74 | Resp 17 | Ht 70.0 in | Wt 163.0 lb

## 2017-12-11 DIAGNOSIS — R768 Other specified abnormal immunological findings in serum: Secondary | ICD-10-CM | POA: Diagnosis not present

## 2017-12-11 DIAGNOSIS — I340 Nonrheumatic mitral (valve) insufficiency: Secondary | ICD-10-CM

## 2017-12-11 DIAGNOSIS — Z7952 Long term (current) use of systemic steroids: Secondary | ICD-10-CM

## 2017-12-11 DIAGNOSIS — I272 Pulmonary hypertension, unspecified: Secondary | ICD-10-CM

## 2017-12-11 DIAGNOSIS — I38 Endocarditis, valve unspecified: Secondary | ICD-10-CM

## 2017-12-11 DIAGNOSIS — D591 Autoimmune hemolytic anemia, unspecified: Secondary | ICD-10-CM

## 2017-12-11 DIAGNOSIS — I482 Chronic atrial fibrillation: Secondary | ICD-10-CM | POA: Diagnosis not present

## 2017-12-11 DIAGNOSIS — Z8639 Personal history of other endocrine, nutritional and metabolic disease: Secondary | ICD-10-CM

## 2017-12-11 DIAGNOSIS — I5032 Chronic diastolic (congestive) heart failure: Secondary | ICD-10-CM | POA: Diagnosis not present

## 2017-12-11 DIAGNOSIS — I4821 Permanent atrial fibrillation: Secondary | ICD-10-CM

## 2017-12-11 DIAGNOSIS — L930 Discoid lupus erythematosus: Secondary | ICD-10-CM | POA: Diagnosis not present

## 2017-12-11 NOTE — Progress Notes (Signed)
Cardiology Office Note Date:  12/11/2017   ID:  Stephanie Horne, DOB [birth year], MRN [id number]  PCP:  Lucious Groves, DO  Cardiologist:  [name], MD    Chief Complaint  Patient presents with  . Follow-up    Severe mitral regurgitation     History of Present Illness: Stephanie Horne is a 76 y.o. female who presents for follow-up of permanent atrial fibrillation, mitral valve regurgitation, and chronic diastolic heart failure.  She has been evaluated by cardiac surgery and felt to be a poor candidate for any heart operation.  She underwent attempted mitral valve edge to edge approximation via a percutaneous approach but this was unsuccessful related to anatomic changes with her heart from severe kyphoscoliosis.  She has been been managed medically and now is chronically anticoagulated with apixaban.  The patient is here with her grandson today.  She is been doing fine from a cardiac perspective.  States that her edema has completely resolved since taking furosemide on a regular basis.  She is tolerating apixaban without bleeding problems.  She denies chest pain, shortness of breath, recurrent leg swelling, orthopnea, PND, or heart palpitations.  Past Medical History:  Diagnosis Date  . AIHA (autoimmune hemolytic anemia) ([place])    12/08 onset  . Atrial fibrillation ([place])    not on coumidin because AIHA and major bleeding complications  . Axillary lymphadenopathy    left negative MMG 1/08  . Back pain, chronic   . Complicated grief 3/97/6734   Death of husband in 01/16/2011   . Diabetes mellitus   . Hyperlipidemia   . Hypertension   . Lupus    Treated with prednisone indefinitely   . Mitral regurgitation   . Mitral regurgitation 10/24/2006   2d Echo from 2008/01/16 - EF 60%, Moderated MR    . Seasonal allergies     Past Surgical History:  Procedure Laterality Date  . CARDIAC CATHETERIZATION N/A 01/07/2016   Procedure: Right/Left Heart Cath and Coronary Angiography;  Surgeon:  [name], MD;  Location: [facility];  Service: Cardiovascular;  Laterality: N/A;  . EYE SURGERY     cataract in both eyes  . TEE WITHOUT CARDIOVERSION N/A 12/09/2015   Procedure: TRANSESOPHAGEAL ECHOCARDIOGRAM (TEE);  Surgeon: [name], MD;  Location: [facility] ENDOSCOPY;  Service: Cardiovascular;  Laterality: N/A;    Current Outpatient Medications  Medication Sig Dispense Refill  . apixaban (ELIQUIS) 5 MG TABS tablet Take 1 tablet (5 mg total) by mouth 2 (two) times daily. 60 tablet 11  . Calcium Carbonate-Vitamin D (CALTRATE 600+D) 600-400 MG-UNIT per tablet Take 1 tablet by mouth 3 (three) times daily with meals. 90 tablet 5  . diclofenac sodium (VOLTAREN) 1 % GEL Apply 1 application topically 2 (two) times daily as needed. For leg, left hip and foot pain 1 Tube 5  . furosemide (LASIX) 40 MG tablet TAKE 1 TABLET (40 MG TOTAL) BY MOUTH DAILY. 90 tablet 1  . glucose blood (ONE TOUCH ULTRA TEST) test strip Use to check blood sugar 1 to 2 times daily. diag code E11.9. Non insulin dependent 180 each 3  . levocetirizine (XYZAL) 5 MG tablet TAKE 0.5 TABLETS (2.5 MG TOTAL) BY MOUTH EVERY EVENING. 45 tablet 3  . losartan (COZAAR) 100 MG tablet Take 1 tablet (100 mg total) by mouth daily. 90 tablet 3  . metFORMIN (GLUCOPHAGE XR) 500 MG 24 hr tablet Take 1 tablet (500 mg total) by mouth 2 (two) times daily. 60 tablet  5  . metoprolol tartrate (LOPRESSOR) 50 MG tablet TAKE 1 TABLET (50 MG TOTAL) BY MOUTH 2 (TWO) TIMES DAILY. 180 tablet 3  . nitroGLYCERIN (NITROSTAT) 0.4 MG SL tablet Place 1 tablet (0.4 mg total) under the tongue every 5 (five) minutes as needed for chest pain. 30 tablet 0  . omeprazole (PRILOSEC) 20 MG capsule Take 1 capsule (20 mg total) by mouth daily. 90 capsule 3  . ONETOUCH DELICA LANCETS FINE MISC 1 each by Does not apply route daily. diag code E11.9. Use to check blood sugar 2 times daily. Non insulin dependent 200 each 3  . Polyethyl Glycol-Propyl Glycol (SYSTANE OP)  Place 1 drop into both eyes daily.    . potassium chloride (K-DUR,KLOR-CON) 20 MEQ tablet Take 1 tablet (20 mEq total) by mouth daily. 90 tablet 0  . pravastatin (PRAVACHOL) 40 MG tablet Take 1 tablet (40 mg total) by mouth daily. 90 tablet 3  . predniSONE (DELTASONE) 5 MG tablet Take 1.5 tablets (7.5 mg total) by mouth daily. 135 tablet 0  . traZODone (DESYREL) 50 MG tablet TAKE 1 TABLET (50 MG TOTAL) BY MOUTH AT BEDTIME AS NEEDED FOR SLEEP. 30 tablet 3  . Vitamin D, Ergocalciferol, (DRISDOL) 50000 units CAPS capsule Take 50,000 Units by mouth every 7 (seven) days.     No current facility-administered medications for this visit.     Allergies:   Metformin and related; Lisinopril; and Pollen extract   Social History:  The patient  reports that she has quit smoking. Her smoking use included cigarettes. she has never used smokeless tobacco. She reports that she does not drink alcohol or use drugs.   Family History:  The patient's  family history includes Diabetes in her daughter; Heart disease in her brother, brother, mother, and sister; Lupus in her daughter.    ROS:  Please see the history of present illness.  All other systems are reviewed and negative.    PHYSICAL EXAM: VS:  BP 132/84   Pulse 75   Ht 5\' 10"  (1.778 m)   Wt 172 lb (78 kg)   BMI 24.68 kg/m  , BMI Body mass index is 24.68 kg/m. GEN: Elderly woman, ambulates with a walker, severe kyphosis, in no acute distress  HEENT: normal  Neck: no JVD, no masses. No carotid bruits Cardiac: RRR with 3/6 systolic murmur at the apex          Respiratory:  clear to auscultation bilaterally, normal work of breathing GI: soft, nontender, nondistended, + BS MS: no deformity or atrophy  Ext: trace bilateral pretibial edema, pedal pulses 2+= bilaterally Skin: warm and dry, no rash Neuro:  Strength and sensation are intact Psych: euthymic mood, full affect  EKG:  EKG is ordered today. The ekg ordered today shows atrial fibrillation 75  bpm, low voltage QRS, possible age-indeterminate septal infarct.  Recent Labs: 09/21/2017: ALT 14; B Natriuretic Peptide 598.7; Hemoglobin 12.0; Platelets 139 09/28/2017: BUN 14; Creatinine, Ser 0.84; Potassium 3.0; Sodium 148   Lipid Panel     Component Value Date/Time   CHOL 182 04/06/2017 1129   TRIG 110 04/06/2017 1129   HDL 77 04/06/2017 1129   CHOLHDL 2.4 04/06/2017 1129   CHOLHDL 3.1 06/04/2014 1418   VLDL 29 06/04/2014 1418   LDLCALC 83 04/06/2017 1129      Wt Readings from Last 3 Encounters:  12/11/17 172 lb (78 kg)  12/11/17 163 lb (73.9 kg)  11/23/17 172 lb 4.8 oz (78.2 kg)  Cardiac Studies Reviewed: 2D echocardiogram December 09, 2015: Study Conclusions  - Left ventricle: The cavity size was normal. Wall thickness was   increased in a pattern of mild LVH. Systolic function was normal.   The estimated ejection fraction was in the range of 60% to 65%.   Wall motion was normal; there were no regional wall motion   abnormalities. - Aortic valve: There was no stenosis. - Aorta: Normal caliber aorta with mild plaque. - Mitral valve: There was severe mitral regurgitation with two jets   noted, one larger than the other. PISA was done on larger jet,   ERO 0.42 cm^2. There was severe prolapse but not flail of the   A1/A2 segments of the anterior leaflet. This is where the mitral   regurgitation originated. There was systolic flattening of the   pulmonary vein PW doppler tracing. - Left atrium: The atrium was severely dilated. No evidence of   thrombus in the atrial cavity or appendage. - Right ventricle: The cavity size was normal. Systolic function   was normal. - Right atrium: The atrium was mildly dilated. - Atrial septum: No defect or patent foramen ovale was identified.   Echo contrast study showed no right-to-left atrial level shunt,   at baseline or with provocation. - Tricuspid valve: There was moderate regurgitation. Peak RV-RA   gradient (S): 51 mm  Hg.  ASSESSMENT AND PLAN: 1.  Permanent atrial fibrillation: Managed with a rate control strategy.  Tolerating oral anticoagulation without significant bleeding problems. Overall stable.   2.  Severe mitral regurgitation: unsuccessful MitraClip in past. Continue with medical therapy. Doing remarkably well. Continue furosemide.   3.  Chronic diastolic heart failure: medications reviewed and no changes recommended today.   4.  Essential hypertension: BP well controlled on current Rx (losartan, metoprolol).  5.  Abdominal aortic atherosclerosis: Managed on apixaban and a statin drug.  Current medicines are reviewed with the patient today.  The patient does not have concerns regarding medicines.  Labs/ tests ordered today include:   Orders Placed This Encounter  Procedures  . EKG 12-Lead   Disposition:   FU 6 months with Richardson Dopp, one year with me.   Signed, [name], MD  12/11/2017 1:48 PM    [facility] [address], [place], [place]  [address] Phone: [phone]; Fax: [phone]

## 2017-12-11 NOTE — Patient Instructions (Signed)
Medication Instructions:  Your provider recommends that you continue on your current medications as directed. Please refer to the Current Medication list given to you today.    Labwork: None  Testing/Procedures: None   Follow-Up: Your provider wants you to follow-up in: 6 months with Scott Weaver, PA. You will receive a reminder letter in the mail two months in advance. If you don't receive a letter, please call our office to schedule the follow-up appointment.    Your provider wants you to follow-up in: 1 year with Dr. Cooper. You will receive a reminder letter in the mail two months in advance. If you don't receive a letter, please call our office to schedule the follow-up appointment.    Any Other Special Instructions Will Be Listed Below (If Applicable).     If you need a refill on your cardiac medications before your next appointment, please call your pharmacy.   

## 2017-12-13 LAB — URINALYSIS, ROUTINE W REFLEX MICROSCOPIC
BILIRUBIN URINE: NEGATIVE
Bacteria, UA: NONE SEEN /HPF
GLUCOSE, UA: NEGATIVE
HYALINE CAST: NONE SEEN /LPF
Hgb urine dipstick: NEGATIVE
Ketones, ur: NEGATIVE
NITRITE: NEGATIVE
PH: 5.5 (ref 5.0–8.0)
Protein, ur: NEGATIVE
Specific Gravity, Urine: 1.026 (ref 1.001–1.03)

## 2017-12-13 LAB — C3 AND C4
C3 Complement: 75 mg/dL — ABNORMAL LOW (ref 83–193)
C4 COMPLEMENT: 8 mg/dL — AB (ref 15–57)

## 2017-12-13 LAB — LUPUS ANTICOAGULANT EVAL W/ REFLEX
DRVVT MIX INTERPRETATION:: POSITIVE — AB
PTT LA SCREEN: 132 s — ABNORMAL HIGH (ref <=40)
dRVVT Screen: 184 s — ABNORMAL HIGH (ref <=45)

## 2017-12-13 LAB — RFX DRVVT 1:1 MIX

## 2017-12-13 LAB — BETA-2 GLYCOPROTEIN ANTIBODIES
BETA 2 GLYCO 1 IGA: 25 SAU — AB (ref <=20)
BETA 2 GLYCO 1 IGM: 20 SMU (ref <=20)
Beta-2 Glyco I IgG: <9 SGU (ref <=20)

## 2017-12-13 LAB — THROMBIN CLOTTING TIME: THROMBIN CLOTTING TIME: 18 s (ref 13–19)

## 2017-12-13 LAB — SJOGRENS SYNDROME-A EXTRACTABLE NUCLEAR ANTIBODY: SSA (RO) (ENA) ANTIBODY, IGG: NEGATIVE AI

## 2017-12-13 LAB — CARDIOLIPIN ANTIBODIES, IGG, IGM, IGA: ANTICARDIOLIPIN IGM: 33 [MPL'U] — AB

## 2017-12-13 LAB — RFLX HEXAGONAL PHASE CONFIRM: Hexagonal Phase Confirm: POSITIVE — AB

## 2017-12-13 LAB — RFLX DRVVT CONFRIM: dRVVT Confirm: POSITIVE — AB

## 2017-12-13 LAB — ANTI-SMITH ANTIBODY: ENA SM Ab Ser-aCnc: <1 AI

## 2017-12-13 LAB — SJOGRENS SYNDROME-B EXTRACTABLE NUCLEAR ANTIBODY: SSB (LA) (ENA) ANTIBODY, IGG: NEGATIVE AI

## 2017-12-13 LAB — RNP ANTIBODY: Ribonucleic Protein(ENA) Antibody, IgG: <1 AI

## 2017-12-14 NOTE — Progress Notes (Signed)
Will discuss at the fu visit

## 2018-01-04 DIAGNOSIS — D591 Autoimmune hemolytic anemia, unspecified: Secondary | ICD-10-CM | POA: Insufficient documentation

## 2018-01-04 NOTE — Progress Notes (Deleted)
Office Visit Note  Patient: Stephanie Horne             Date of Birth: [birth year]           MRN: [id number]             PCP: Lucious Groves, DO Referring: Lucious Groves, DO Visit Date: 01/11/2018 Occupation: @GUAROCC @    Subjective:  No chief complaint on file.   History of Present Illness: Stephanie Horne is a 77 y.o. female ***   Activities of Daily Living:  Patient reports morning stiffness for *** {minute/hour:19697}.   Patient {ACTIONS;DENIES/REPORTS:21021675::"Denies"} nocturnal pain.  Difficulty dressing/grooming: {ACTIONS;DENIES/REPORTS:21021675::"Denies"} Difficulty climbing stairs: {ACTIONS;DENIES/REPORTS:21021675::"Denies"} Difficulty getting out of chair: {ACTIONS;DENIES/REPORTS:21021675::"Denies"} Difficulty using hands for taps, buttons, cutlery, and/or writing: {ACTIONS;DENIES/REPORTS:21021675::"Denies"}   No Rheumatology ROS completed.   PMFS History:  Patient Active Problem List   Diagnosis Date Noted  . Hearing loss 11/23/2017  . Coronary artery disease, non-occlusive 11/23/2017  . Impaired memory 11/23/2017  . Heart failure due to valvular disease, chronic, diastolic ([place]) 07/19/5101  . Abdominal aortic atherosclerosis ([place]) 04/11/2017  . Allergic rhinitis 05/07/2015  . Pulmonary hypertension ([place]) 10/09/2014  . Severe mitral regurgitation 10/09/2014  . Osteoporosis of forearm without pathological fracture 05/29/2013  . Insomnia 05/29/2013  . Subacute cough 02/22/2012  . Vitamin D deficiency 06/30/2011  . Type 2 diabetes mellitus, uncontrolled ([place]) 02/27/2008  . History of autoimmune hemolytic anemia 10/12/2007  . Atrial fibrillation ([place]) 10/24/2006  . Hyperlipidemia associated with type 2 diabetes mellitus ([place]) 08/02/2006  . Essential hypertension 08/02/2006  . Lupus ([place]) 08/02/2006  . Low back pain 08/02/2006    Past Medical History:  Diagnosis Date  . AIHA (autoimmune hemolytic anemia) ([place])    12/08 onset  . Atrial fibrillation  ([place])    not on coumidin because AIHA and major bleeding complications  . Axillary lymphadenopathy    left negative MMG 1/08  . Back pain, chronic   . Complicated grief 5/85/2778   Death of husband in January 27, 2011   . Diabetes mellitus   . Hyperlipidemia   . Hypertension   . Lupus    Treated with prednisone indefinitely   . Mitral regurgitation   . Mitral regurgitation 10/24/2006   2d Echo from 01-27-08 - EF 60%, Moderated MR    . Seasonal allergies     Family History  Problem Relation Age of Onset  . Heart disease Mother   . Heart disease Sister   . Heart disease Brother   . Heart disease Brother   . Diabetes Daughter   . Lupus Daughter    Past Surgical History:  Procedure Laterality Date  . CARDIAC CATHETERIZATION N/A 01/07/2016   Procedure: Right/Left Heart Cath and Coronary Angiography;  Surgeon: [name], MD;  Location: [facility];  Service: Cardiovascular;  Laterality: N/A;  . EYE SURGERY     cataract in both eyes  . TEE WITHOUT CARDIOVERSION N/A 12/09/2015   Procedure: TRANSESOPHAGEAL ECHOCARDIOGRAM (TEE);  Surgeon: [name], MD;  Location: [facility] ENDOSCOPY;  Service: Cardiovascular;  Laterality: N/A;   Social History   Social History Narrative  . Not on file     Objective: Vital Signs: There were no vitals taken for this visit.   Physical Exam   Musculoskeletal Exam: ***  CDAI Exam: No CDAI exam completed.    Investigation: Findings:  July 2018 ANA negative, dsDNA positive  UA negative,  Lupus anticoagulant positive, beta-2 IgA 33, C3and C4 low, RNP negative,  Smith negative,SSA-, SSB-  Imaging: No results found.  Speciality Comments: No specialty comments available.    Procedures:  No procedures performed Allergies: Metformin and related; Lisinopril; and Pollen extract   Assessment / Plan:     Visit Diagnoses: No diagnosis found.    Orders: No orders of the defined types were placed in this encounter.  No orders of the defined  types were placed in this encounter.   Face-to-face time spent with patient was *** minutes. 50% of time was spent in counseling and coordination of care.  Follow-Up Instructions: No follow-ups on file.   Bo Merino, MD  Note - This record has been created using Editor, commissioning.  Chart creation errors have been sought, but may not always  have been located. Such creation errors do not reflect on  the standard of medical care.

## 2018-01-09 ENCOUNTER — Other Ambulatory Visit: Payer: Self-pay | Admitting: *Deleted

## 2018-01-09 DIAGNOSIS — I5032 Chronic diastolic (congestive) heart failure: Principal | ICD-10-CM

## 2018-01-09 DIAGNOSIS — I38 Endocarditis, valve unspecified: Secondary | ICD-10-CM

## 2018-01-09 MED ORDER — POTASSIUM CHLORIDE CRYS ER 20 MEQ PO TBCR: 20.0000 meq | EXTENDED_RELEASE_TABLET | Freq: Every day | ORAL | 0 refills | Status: DC

## 2018-01-11 ENCOUNTER — Ambulatory Visit: Payer: Self-pay | Admitting: Rheumatology

## 2018-02-02 ENCOUNTER — Other Ambulatory Visit: Payer: Self-pay | Admitting: *Deleted

## 2018-02-02 MED ORDER — TRAZODONE HCL 50 MG PO TABS: ORAL_TABLET | ORAL | 0 refills | Status: DC

## 2018-03-06 ENCOUNTER — Ambulatory Visit: Payer: Medicare Other | Attending: Audiology | Admitting: Audiology

## 2018-03-06 NOTE — Addendum Note (Signed)
Addended by: Hulan Fray on: 03/06/2018 04:39 PM   Modules accepted: Orders

## 2018-03-15 ENCOUNTER — Other Ambulatory Visit: Payer: Self-pay | Admitting: *Deleted

## 2018-03-15 MED ORDER — PREDNISONE 5 MG PO TABS: 7.5000 mg | ORAL_TABLET | Freq: Every day | ORAL | 0 refills | Status: DC

## 2018-04-09 ENCOUNTER — Other Ambulatory Visit: Payer: Self-pay

## 2018-04-09 DIAGNOSIS — I10 Essential (primary) hypertension: Secondary | ICD-10-CM

## 2018-04-09 NOTE — Telephone Encounter (Signed)
apixaban (ELIQUIS) 5 MG TABS tablet, Refill request @ [facility].

## 2018-04-09 NOTE — Telephone Encounter (Signed)
Patient called to request refill of Eliquis.  Our providers did not prescribe medication.  They should contact [facility].   Laverle Patter, RN

## 2018-04-10 ENCOUNTER — Telehealth: Payer: Self-pay | Admitting: Cardiovascular Disease

## 2018-04-10 DIAGNOSIS — I10 Essential (primary) hypertension: Secondary | ICD-10-CM

## 2018-04-10 MED ORDER — APIXABAN 5 MG PO TABS: 5.0000 mg | ORAL_TABLET | Freq: Two times a day (BID) | ORAL | 1 refills | Status: DC

## 2018-04-10 NOTE — Telephone Encounter (Signed)
Pt last saw Dr Burt Knack 12/11/17, last labs 09/28/17 Creat 0.84, age 77, weight 78 kg, based on specified criteria pt is on appropriate dosage of Eliquis 5mg  BID. Will refill rx.

## 2018-04-10 NOTE — Telephone Encounter (Signed)
This Rx was sent to [facility] today. Hubbard Hartshorn, RN, BSN

## 2018-04-10 NOTE — Telephone Encounter (Signed)
New Message:       *STAT* If patient is at the pharmacy, call can be transferred to refill team.   1. Which medications need to be refilled? (please list name of each medication and dose if known) apixaban (ELIQUIS) 5 MG TABS tablet  2. Which pharmacy/location (including street and city if local pharmacy) is medication to be sent to?[place]  3. Do they need a 30 day or 90 day supply? [place]

## 2018-04-18 ENCOUNTER — Other Ambulatory Visit: Payer: Self-pay | Admitting: *Deleted

## 2018-04-18 ENCOUNTER — Other Ambulatory Visit: Payer: Self-pay | Admitting: Internal Medicine

## 2018-04-18 MED ORDER — TRAZODONE HCL 50 MG PO TABS: ORAL_TABLET | ORAL | 1 refills | Status: DC

## 2018-04-18 NOTE — Telephone Encounter (Signed)
Will refill for now, but should be readdressed by Dr. Heber [place] when he returns.

## 2018-04-18 NOTE — Telephone Encounter (Signed)
NEEDS REFILL ON MEDICATION TO HELP HER SLEEP, [facility]

## 2018-04-23 ENCOUNTER — Other Ambulatory Visit: Payer: Self-pay | Admitting: *Deleted

## 2018-04-23 MED ORDER — OMEPRAZOLE 20 MG PO CPDR: 20.0000 mg | DELAYED_RELEASE_CAPSULE | Freq: Every day | ORAL | 3 refills | Status: DC

## 2018-04-23 MED ORDER — LEVOCETIRIZINE DIHYDROCHLORIDE 5 MG PO TABS: ORAL_TABLET | ORAL | 3 refills | Status: DC

## 2018-05-10 ENCOUNTER — Other Ambulatory Visit: Payer: Self-pay | Admitting: Internal Medicine

## 2018-05-10 ENCOUNTER — Encounter: Payer: Self-pay | Admitting: Internal Medicine

## 2018-05-10 ENCOUNTER — Ambulatory Visit (INDEPENDENT_AMBULATORY_CARE_PROVIDER_SITE_OTHER): Payer: Medicare Other | Admitting: Internal Medicine

## 2018-05-10 ENCOUNTER — Other Ambulatory Visit: Payer: Self-pay

## 2018-05-10 VITALS — BP 118/63 | HR 71 | Temp 98.3°F | Ht 70.0 in | Wt 161.7 lb

## 2018-05-10 DIAGNOSIS — E1165 Type 2 diabetes mellitus with hyperglycemia: Secondary | ICD-10-CM | POA: Diagnosis not present

## 2018-05-10 DIAGNOSIS — Z9181 History of falling: Secondary | ICD-10-CM

## 2018-05-10 DIAGNOSIS — I4821 Permanent atrial fibrillation: Secondary | ICD-10-CM

## 2018-05-10 DIAGNOSIS — I482 Chronic atrial fibrillation: Secondary | ICD-10-CM

## 2018-05-10 DIAGNOSIS — Y92009 Unspecified place in unspecified non-institutional (private) residence as the place of occurrence of the external cause: Secondary | ICD-10-CM

## 2018-05-10 DIAGNOSIS — Z862 Personal history of diseases of the blood and blood-forming organs and certain disorders involving the immune mechanism: Secondary | ICD-10-CM | POA: Diagnosis not present

## 2018-05-10 DIAGNOSIS — R2681 Unsteadiness on feet: Secondary | ICD-10-CM | POA: Insufficient documentation

## 2018-05-10 DIAGNOSIS — I10 Essential (primary) hypertension: Secondary | ICD-10-CM | POA: Diagnosis not present

## 2018-05-10 DIAGNOSIS — Z7952 Long term (current) use of systemic steroids: Secondary | ICD-10-CM

## 2018-05-10 DIAGNOSIS — R413 Other amnesia: Secondary | ICD-10-CM

## 2018-05-10 DIAGNOSIS — W19XXXA Unspecified fall, initial encounter: Secondary | ICD-10-CM | POA: Insufficient documentation

## 2018-05-10 DIAGNOSIS — R011 Cardiac murmur, unspecified: Secondary | ICD-10-CM

## 2018-05-10 DIAGNOSIS — Z7984 Long term (current) use of oral hypoglycemic drugs: Secondary | ICD-10-CM

## 2018-05-10 DIAGNOSIS — Z79899 Other long term (current) drug therapy: Secondary | ICD-10-CM

## 2018-05-10 DIAGNOSIS — Z7901 Long term (current) use of anticoagulants: Secondary | ICD-10-CM

## 2018-05-10 LAB — POCT GLYCOSYLATED HEMOGLOBIN (HGB A1C): Hemoglobin A1C: 7.7 % — AB (ref 4.0–5.6)

## 2018-05-10 LAB — GLUCOSE, CAPILLARY: GLUCOSE-CAPILLARY: 150 mg/dL — AB (ref 70–99)

## 2018-05-10 MED ORDER — METFORMIN HCL ER 500 MG PO TB24: 500.0000 mg | ORAL_TABLET | Freq: Two times a day (BID) | ORAL | 5 refills | Status: DC

## 2018-05-10 MED ORDER — APIXABAN 5 MG PO TABS: 5.0000 mg | ORAL_TABLET | Freq: Two times a day (BID) | ORAL | 3 refills | Status: DC

## 2018-05-11 LAB — CBC WITH DIFFERENTIAL/PLATELET
BASOS: 0 %
Basophils Absolute: 0 10*3/uL (ref 0.0–0.2)
EOS (ABSOLUTE): 0.1 10*3/uL (ref 0.0–0.4)
EOS: 1 %
HEMATOCRIT: 36.8 % (ref 34.0–46.6)
HEMOGLOBIN: 11.6 g/dL (ref 11.1–15.9)
IMMATURE GRANS (ABS): 0 10*3/uL (ref 0.0–0.1)
IMMATURE GRANULOCYTES: 1 %
LYMPHS: 25 %
Lymphocytes Absolute: 1.5 10*3/uL (ref 0.7–3.1)
MCH: 30.5 pg (ref 26.6–33.0)
MCHC: 31.5 g/dL (ref 31.5–35.7)
MCV: 97 fL (ref 79–97)
MONOCYTES: 9 %
Monocytes Absolute: 0.6 10*3/uL (ref 0.1–0.9)
NEUTROS PCT: 64 %
Neutrophils Absolute: 3.7 10*3/uL (ref 1.4–7.0)
PLATELETS: 223 10*3/uL (ref 150–450)
RBC: 3.8 x10E6/uL (ref 3.77–5.28)
RDW: 14 % (ref 12.3–15.4)
WBC: 5.8 10*3/uL (ref 3.4–10.8)

## 2018-05-11 LAB — BMP8+ANION GAP
Anion Gap: 18 mmol/L (ref 10.0–18.0)
BUN/Creatinine Ratio: 14 (ref 12–28)
BUN: 11 mg/dL (ref 8–27)
CALCIUM: 9.1 mg/dL (ref 8.7–10.3)
CO2: 19 mmol/L — ABNORMAL LOW (ref 20–29)
CREATININE: 0.81 mg/dL (ref 0.57–1.00)
Chloride: 108 mmol/L — ABNORMAL HIGH (ref 96–106)
GFR calc Af Amer: 80 mL/min/{1.73_m2} (ref >59)
GFR calc non Af Amer: 69 mL/min/{1.73_m2} (ref >59)
Glucose: 156 mg/dL — ABNORMAL HIGH (ref 65–99)
Potassium: 4 mmol/L (ref 3.5–5.2)
Sodium: 145 mmol/L — ABNORMAL HIGH (ref 134–144)

## 2018-05-13 NOTE — Assessment & Plan Note (Signed)
HPI: She reports to me that she had a fall on her way from her bed to her bathroom she landed on her right side she did not suffer any injury due to this however she was unable to get up on her own she remained on the floor for 2 hours until her grandson made it to the house.  She does have a life alert bracelet but does not wear it while at home she reports that she only wears it when she is going out.  She does have a rolling walker however does not use it while in the bedroom.  I discussed with her the importance of avoiding falls and also safety after a fall I have asked her to wear the life alert bracelet especially when she is alone.  I also encouraged her to have home health physical therapy and nurse to come out to assess.  The daughter is also concerned that she may need a mobility scooter I think this could be reasonable would like to see physical therapy's assessment.  Assessment fall at home  Plan Home health PT and RN

## 2018-05-13 NOTE — Assessment & Plan Note (Signed)
HPI: no complaints, adherent to medication  A: Essential HTN at goal  P: Continue metoprolol tartrate 50 mg twice daily, continue losartan 100 mg daily, continue Lasix 40 mg daily

## 2018-05-13 NOTE — Progress Notes (Signed)
Subjective:  HPI: [name] is a 77 y.o. female who presents forf/u of DM, HTN.  She is accompanied by her daughter Stephanie Horne.  Please see Assessment and Plan below for the status of her chronic medical problems.  Review of Systems: Review of Systems  Constitutional: Negative for weight loss.  Respiratory: Negative for shortness of breath.   Gastrointestinal: Negative for constipation and diarrhea.  Genitourinary: Negative for frequency.  Musculoskeletal: Positive for falls. Negative for back pain, joint pain and neck pain.  Endo/Heme/Allergies: Negative for polydipsia.    Objective:  Physical Exam: Vitals:   05/10/18 0918  BP: 118/63  Pulse: 71  Temp: 98.3 F (36.8 C)  TempSrc: Oral  SpO2: 98%  Weight: 161 lb 11.2 oz (73.3 kg)  Height: 5\' 10"  (1.778 m)   Physical Exam  Constitutional: She is oriented to person, place, and time. She appears well-developed and well-nourished. No distress.  In wheelchair  HENT:  Head: Normocephalic and atraumatic.  Cardiovascular: Normal rate.  Murmur heard. Pulmonary/Chest: Effort normal and breath sounds normal. She has no wheezes. She has no rales.  Abdominal: Soft. Bowel sounds are normal.  Musculoskeletal: She exhibits edema (2+ on right leg, trace on left).  Neurological: She is alert and oriented to person, place, and time.  4/5 bilateral upper proximal and distal strength, 4/5 bilateral lower proximal and distal strength  Nursing note and vitals reviewed.  Assessment & Plan:  Essential hypertension HPI: no complaints, adherent to medication  A: Essential HTN at goal  P: Continue metoprolol tartrate 50 mg twice daily, continue losartan 100 mg daily, continue Lasix 40 mg daily  Atrial fibrillation (HCC) HPI: Patient and her daughter very concerned about the cost of the skilled nurse Eliquis.  Recently this cost has been about $130 per month and they are having difficulty affording it.  She has not had any problems with  Eliquis she has not had any bleeding while on it.  She understands this Eliquis is to reduce her risk of having a stroke given her A. Fib.  Assessment: Permanent  A. Fib  Plan I reviewed her insurance formulary with her and her daughter it appears that Eliquis and Xarelto are both tier 3 however the call should be much less than 135 if at a preferred pharmacy I discussed that may be using the mail order pharmacy may be the best option.  They are hesitant for this so I have provided them with a paper prescription where they can shop around pharmacies or mail it in.  I also provided her with samples from our clinic for a 10-day supply of Eliquis.  Type 2 diabetes mellitus, uncontrolled (HCC) HPI : I reviewed her medications with her it appears she is only been taking metformin 500 mg once a day instead of the twice a day that I had prescribed.  She does not have any side effects from metformin otherwise is tolerating it well.  She remains on 7.5 mg of prednisone for her history of hemolytic anemia however we have recently clarified with rheumatology but it does not appear that she has history of lupus.  Assessment uncontrolled type 2 diabetes with hyperglycemia  Plan I will have her start back to metformin extended release 500 mg twice a day.  History of autoimmune hemolytic anemia Continue prednisone 7.5 mg daily recheck CBC  Fall at home, initial encounter HPI: She reports to me that she had a fall on her way from her bed to her bathroom she landed  on her right side she did not suffer any injury due to this however she was unable to get up on her own she remained on the floor for 2 hours until her grandson made it to the house.  She does have a life alert bracelet but does not wear it while at home she reports that she only wears it when she is going out.  She does have a rolling walker however does not use it while in the bedroom.  I discussed with her the importance of avoiding falls and also  safety after a fall I have asked her to wear the life alert bracelet especially when she is alone.  I also encouraged her to have home health physical therapy and nurse to come out to assess.  The daughter is also concerned that she may need a mobility scooter I think this could be reasonable would like to see physical therapy's assessment.  Assessment fall at home  Plan Home health PT and RN   Medications Ordered Meds ordered this encounter  Medications  . apixaban (ELIQUIS) 5 MG TABS tablet    Sig: Take 1 tablet (5 mg total) by mouth 2 (two) times daily.    Dispense:  180 tablet    Refill:  3  . metFORMIN (GLUCOPHAGE XR) 500 MG 24 hr tablet    Sig: Take 1 tablet (500 mg total) by mouth 2 (two) times daily.    Dispense:  60 tablet    Refill:  5   Other Orders Orders Placed This Encounter  Procedures  . BMP8+Anion Gap  . CBC with Diff  . Glucose, capillary  . Ambulatory referral to Home Health    Referral Priority:   Routine    Referral Type:   Home Health Care    Referral Reason:   Specialty Services Required    Requested Specialty:   [place]    Number of Visits Requested:   1  . POC Hbg A1C   Follow Up: Return in about 3 months (around 08/10/2018).

## 2018-05-13 NOTE — Assessment & Plan Note (Signed)
HPI : I reviewed her medications with her it appears she is only been taking metformin 500 mg once a day instead of the twice a day that I had prescribed.  She does not have any side effects from metformin otherwise is tolerating it well.  She remains on 7.5 mg of prednisone for her history of hemolytic anemia however we have recently clarified with rheumatology but it does not appear that she has history of lupus.  Assessment uncontrolled type 2 diabetes with hyperglycemia  Plan I will have her start back to metformin extended release 500 mg twice a day.

## 2018-05-13 NOTE — Assessment & Plan Note (Signed)
Continue prednisone 7.5 mg daily recheck CBC

## 2018-05-13 NOTE — Assessment & Plan Note (Addendum)
HPI: Patient and her daughter very concerned about the cost of the skilled nurse Eliquis.  Recently this cost has been about $130 per month and they are having difficulty affording it.  She has not had any problems with Eliquis she has not had any bleeding while on it.  She understands this Eliquis is to reduce her risk of having a stroke given her A. Fib.  Assessment: Permanent  A. Fib  Plan I reviewed her insurance formulary with her and her daughter it appears that Eliquis and Xarelto are both tier 3 however the call should be much less than 135 if at a preferred pharmacy I discussed that may be using the mail order pharmacy may be the best option.  They are hesitant for this so I have provided them with a paper prescription where they can shop around pharmacies or mail it in.  I also provided her with samples from our clinic for a 10-day supply of Eliquis.

## 2018-05-21 ENCOUNTER — Other Ambulatory Visit: Payer: Self-pay | Admitting: Internal Medicine

## 2018-05-21 ENCOUNTER — Telehealth: Payer: Self-pay | Admitting: *Deleted

## 2018-05-21 DIAGNOSIS — Z7409 Other reduced mobility: Secondary | ICD-10-CM | POA: Diagnosis not present

## 2018-05-21 DIAGNOSIS — I10 Essential (primary) hypertension: Secondary | ICD-10-CM | POA: Diagnosis not present

## 2018-05-21 DIAGNOSIS — R2689 Other abnormalities of gait and mobility: Secondary | ICD-10-CM | POA: Diagnosis not present

## 2018-05-21 DIAGNOSIS — M329 Systemic lupus erythematosus, unspecified: Secondary | ICD-10-CM | POA: Diagnosis not present

## 2018-05-21 DIAGNOSIS — E119 Type 2 diabetes mellitus without complications: Secondary | ICD-10-CM

## 2018-05-21 DIAGNOSIS — I4891 Unspecified atrial fibrillation: Secondary | ICD-10-CM | POA: Diagnosis not present

## 2018-05-21 DIAGNOSIS — Z9181 History of falling: Secondary | ICD-10-CM | POA: Diagnosis not present

## 2018-05-21 DIAGNOSIS — G3184 Mild cognitive impairment, so stated: Secondary | ICD-10-CM | POA: Diagnosis not present

## 2018-05-21 DIAGNOSIS — M4186 Other forms of scoliosis, lumbar region: Secondary | ICD-10-CM | POA: Diagnosis not present

## 2018-05-21 MED ORDER — GLUCOSE BLOOD VI STRP: ORAL_STRIP | 1 refills | Status: DC

## 2018-05-21 MED ORDER — ONETOUCH DELICA LANCETS FINE MISC: 1.0000 | Freq: Two times a day (BID) | 1 refills | Status: DC

## 2018-05-21 NOTE — Telephone Encounter (Signed)
This has been done.

## 2018-05-21 NOTE — Telephone Encounter (Signed)
Refill Request-Pt no longer receives her diabetic supplies from [facility] because it was taking too long and would like to [facility].  Pt to notify [facility] as well of this change.   ONETOUCH DELICA LANCETS FINE MISC  glucose blood (ONE TOUCH ULTRA TEST) test strip

## 2018-05-21 NOTE — Telephone Encounter (Signed)
pls sch Nov PCP appt DM F/U

## 2018-05-22 NOTE — Telephone Encounter (Signed)
November schedule is not available for Dr. Angelia Mould.  Sending back to triage to make them aware.  Patient will have to call back at a later date to get an appointment.

## 2018-06-01 DIAGNOSIS — G3184 Mild cognitive impairment, so stated: Secondary | ICD-10-CM | POA: Diagnosis not present

## 2018-06-01 DIAGNOSIS — Z9181 History of falling: Secondary | ICD-10-CM | POA: Diagnosis not present

## 2018-06-01 DIAGNOSIS — M4186 Other forms of scoliosis, lumbar region: Secondary | ICD-10-CM | POA: Diagnosis not present

## 2018-06-01 DIAGNOSIS — R2689 Other abnormalities of gait and mobility: Secondary | ICD-10-CM | POA: Diagnosis not present

## 2018-06-01 DIAGNOSIS — I10 Essential (primary) hypertension: Secondary | ICD-10-CM | POA: Diagnosis not present

## 2018-06-01 DIAGNOSIS — E119 Type 2 diabetes mellitus without complications: Secondary | ICD-10-CM | POA: Diagnosis not present

## 2018-06-01 DIAGNOSIS — Z7409 Other reduced mobility: Secondary | ICD-10-CM | POA: Diagnosis not present

## 2018-06-01 DIAGNOSIS — M329 Systemic lupus erythematosus, unspecified: Secondary | ICD-10-CM | POA: Diagnosis not present

## 2018-06-01 DIAGNOSIS — I4891 Unspecified atrial fibrillation: Secondary | ICD-10-CM | POA: Diagnosis not present

## 2018-06-05 DIAGNOSIS — I4891 Unspecified atrial fibrillation: Secondary | ICD-10-CM | POA: Diagnosis not present

## 2018-06-05 DIAGNOSIS — I10 Essential (primary) hypertension: Secondary | ICD-10-CM | POA: Diagnosis not present

## 2018-06-05 DIAGNOSIS — E119 Type 2 diabetes mellitus without complications: Secondary | ICD-10-CM | POA: Diagnosis not present

## 2018-06-05 DIAGNOSIS — M4186 Other forms of scoliosis, lumbar region: Secondary | ICD-10-CM | POA: Diagnosis not present

## 2018-06-05 DIAGNOSIS — G3184 Mild cognitive impairment, so stated: Secondary | ICD-10-CM | POA: Diagnosis not present

## 2018-06-05 DIAGNOSIS — Z9181 History of falling: Secondary | ICD-10-CM | POA: Diagnosis not present

## 2018-06-05 DIAGNOSIS — Z7409 Other reduced mobility: Secondary | ICD-10-CM | POA: Diagnosis not present

## 2018-06-05 DIAGNOSIS — M329 Systemic lupus erythematosus, unspecified: Secondary | ICD-10-CM | POA: Diagnosis not present

## 2018-06-05 DIAGNOSIS — R2689 Other abnormalities of gait and mobility: Secondary | ICD-10-CM | POA: Diagnosis not present

## 2018-06-08 DIAGNOSIS — Z7409 Other reduced mobility: Secondary | ICD-10-CM | POA: Diagnosis not present

## 2018-06-08 DIAGNOSIS — G3184 Mild cognitive impairment, so stated: Secondary | ICD-10-CM | POA: Diagnosis not present

## 2018-06-08 DIAGNOSIS — I4891 Unspecified atrial fibrillation: Secondary | ICD-10-CM | POA: Diagnosis not present

## 2018-06-08 DIAGNOSIS — I10 Essential (primary) hypertension: Secondary | ICD-10-CM | POA: Diagnosis not present

## 2018-06-08 DIAGNOSIS — R2689 Other abnormalities of gait and mobility: Secondary | ICD-10-CM | POA: Diagnosis not present

## 2018-06-08 DIAGNOSIS — M329 Systemic lupus erythematosus, unspecified: Secondary | ICD-10-CM | POA: Diagnosis not present

## 2018-06-08 DIAGNOSIS — Z9181 History of falling: Secondary | ICD-10-CM | POA: Diagnosis not present

## 2018-06-08 DIAGNOSIS — M4186 Other forms of scoliosis, lumbar region: Secondary | ICD-10-CM | POA: Diagnosis not present

## 2018-06-08 DIAGNOSIS — E119 Type 2 diabetes mellitus without complications: Secondary | ICD-10-CM | POA: Diagnosis not present

## 2018-06-11 DIAGNOSIS — R2689 Other abnormalities of gait and mobility: Secondary | ICD-10-CM | POA: Diagnosis not present

## 2018-06-11 DIAGNOSIS — E119 Type 2 diabetes mellitus without complications: Secondary | ICD-10-CM | POA: Diagnosis not present

## 2018-06-11 DIAGNOSIS — I4891 Unspecified atrial fibrillation: Secondary | ICD-10-CM | POA: Diagnosis not present

## 2018-06-11 DIAGNOSIS — Z9181 History of falling: Secondary | ICD-10-CM | POA: Diagnosis not present

## 2018-06-11 DIAGNOSIS — M4186 Other forms of scoliosis, lumbar region: Secondary | ICD-10-CM | POA: Diagnosis not present

## 2018-06-11 DIAGNOSIS — Z7409 Other reduced mobility: Secondary | ICD-10-CM | POA: Diagnosis not present

## 2018-06-11 DIAGNOSIS — G3184 Mild cognitive impairment, so stated: Secondary | ICD-10-CM | POA: Diagnosis not present

## 2018-06-11 DIAGNOSIS — M329 Systemic lupus erythematosus, unspecified: Secondary | ICD-10-CM | POA: Diagnosis not present

## 2018-06-11 DIAGNOSIS — I10 Essential (primary) hypertension: Secondary | ICD-10-CM | POA: Diagnosis not present

## 2018-06-15 DIAGNOSIS — M329 Systemic lupus erythematosus, unspecified: Secondary | ICD-10-CM | POA: Diagnosis not present

## 2018-06-15 DIAGNOSIS — I4891 Unspecified atrial fibrillation: Secondary | ICD-10-CM | POA: Diagnosis not present

## 2018-06-15 DIAGNOSIS — I10 Essential (primary) hypertension: Secondary | ICD-10-CM | POA: Diagnosis not present

## 2018-06-15 DIAGNOSIS — R2689 Other abnormalities of gait and mobility: Secondary | ICD-10-CM | POA: Diagnosis not present

## 2018-06-15 DIAGNOSIS — E119 Type 2 diabetes mellitus without complications: Secondary | ICD-10-CM | POA: Diagnosis not present

## 2018-06-15 DIAGNOSIS — M4186 Other forms of scoliosis, lumbar region: Secondary | ICD-10-CM | POA: Diagnosis not present

## 2018-06-15 DIAGNOSIS — Z7409 Other reduced mobility: Secondary | ICD-10-CM | POA: Diagnosis not present

## 2018-06-15 DIAGNOSIS — Z9181 History of falling: Secondary | ICD-10-CM | POA: Diagnosis not present

## 2018-06-15 DIAGNOSIS — G3184 Mild cognitive impairment, so stated: Secondary | ICD-10-CM | POA: Diagnosis not present

## 2018-06-20 DIAGNOSIS — M4186 Other forms of scoliosis, lumbar region: Secondary | ICD-10-CM | POA: Diagnosis not present

## 2018-06-20 DIAGNOSIS — Z9181 History of falling: Secondary | ICD-10-CM | POA: Diagnosis not present

## 2018-06-20 DIAGNOSIS — E119 Type 2 diabetes mellitus without complications: Secondary | ICD-10-CM | POA: Diagnosis not present

## 2018-06-20 DIAGNOSIS — R2689 Other abnormalities of gait and mobility: Secondary | ICD-10-CM | POA: Diagnosis not present

## 2018-06-20 DIAGNOSIS — M329 Systemic lupus erythematosus, unspecified: Secondary | ICD-10-CM | POA: Diagnosis not present

## 2018-06-20 DIAGNOSIS — G3184 Mild cognitive impairment, so stated: Secondary | ICD-10-CM | POA: Diagnosis not present

## 2018-06-20 DIAGNOSIS — I4891 Unspecified atrial fibrillation: Secondary | ICD-10-CM | POA: Diagnosis not present

## 2018-06-20 DIAGNOSIS — Z7409 Other reduced mobility: Secondary | ICD-10-CM | POA: Diagnosis not present

## 2018-06-20 DIAGNOSIS — I10 Essential (primary) hypertension: Secondary | ICD-10-CM | POA: Diagnosis not present

## 2018-06-22 DIAGNOSIS — Z7409 Other reduced mobility: Secondary | ICD-10-CM | POA: Diagnosis not present

## 2018-06-22 DIAGNOSIS — M4186 Other forms of scoliosis, lumbar region: Secondary | ICD-10-CM | POA: Diagnosis not present

## 2018-06-22 DIAGNOSIS — G3184 Mild cognitive impairment, so stated: Secondary | ICD-10-CM | POA: Diagnosis not present

## 2018-06-22 DIAGNOSIS — Z9181 History of falling: Secondary | ICD-10-CM | POA: Diagnosis not present

## 2018-06-22 DIAGNOSIS — E119 Type 2 diabetes mellitus without complications: Secondary | ICD-10-CM | POA: Diagnosis not present

## 2018-06-22 DIAGNOSIS — I10 Essential (primary) hypertension: Secondary | ICD-10-CM | POA: Diagnosis not present

## 2018-06-22 DIAGNOSIS — I4891 Unspecified atrial fibrillation: Secondary | ICD-10-CM | POA: Diagnosis not present

## 2018-06-22 DIAGNOSIS — R2689 Other abnormalities of gait and mobility: Secondary | ICD-10-CM | POA: Diagnosis not present

## 2018-06-22 DIAGNOSIS — M329 Systemic lupus erythematosus, unspecified: Secondary | ICD-10-CM | POA: Diagnosis not present

## 2018-06-26 NOTE — Telephone Encounter (Signed)
I see dr Heber Vona now has a nov clinic, could we make pt an appt for that clinic?

## 2018-06-27 DIAGNOSIS — R2689 Other abnormalities of gait and mobility: Secondary | ICD-10-CM | POA: Diagnosis not present

## 2018-06-27 DIAGNOSIS — M4186 Other forms of scoliosis, lumbar region: Secondary | ICD-10-CM | POA: Diagnosis not present

## 2018-06-27 DIAGNOSIS — Z9181 History of falling: Secondary | ICD-10-CM | POA: Diagnosis not present

## 2018-06-27 DIAGNOSIS — G3184 Mild cognitive impairment, so stated: Secondary | ICD-10-CM | POA: Diagnosis not present

## 2018-06-27 DIAGNOSIS — Z7409 Other reduced mobility: Secondary | ICD-10-CM | POA: Diagnosis not present

## 2018-06-27 DIAGNOSIS — M329 Systemic lupus erythematosus, unspecified: Secondary | ICD-10-CM | POA: Diagnosis not present

## 2018-06-27 DIAGNOSIS — I4891 Unspecified atrial fibrillation: Secondary | ICD-10-CM | POA: Diagnosis not present

## 2018-06-27 DIAGNOSIS — E119 Type 2 diabetes mellitus without complications: Secondary | ICD-10-CM | POA: Diagnosis not present

## 2018-06-27 DIAGNOSIS — I10 Essential (primary) hypertension: Secondary | ICD-10-CM | POA: Diagnosis not present

## 2018-06-28 DIAGNOSIS — Z23 Encounter for immunization: Secondary | ICD-10-CM | POA: Diagnosis not present

## 2018-06-29 DIAGNOSIS — G3184 Mild cognitive impairment, so stated: Secondary | ICD-10-CM | POA: Diagnosis not present

## 2018-06-29 DIAGNOSIS — M4186 Other forms of scoliosis, lumbar region: Secondary | ICD-10-CM | POA: Diagnosis not present

## 2018-06-29 DIAGNOSIS — I4891 Unspecified atrial fibrillation: Secondary | ICD-10-CM | POA: Diagnosis not present

## 2018-06-29 DIAGNOSIS — R2689 Other abnormalities of gait and mobility: Secondary | ICD-10-CM | POA: Diagnosis not present

## 2018-06-29 DIAGNOSIS — M329 Systemic lupus erythematosus, unspecified: Secondary | ICD-10-CM | POA: Diagnosis not present

## 2018-06-29 DIAGNOSIS — Z9181 History of falling: Secondary | ICD-10-CM | POA: Diagnosis not present

## 2018-06-29 DIAGNOSIS — I10 Essential (primary) hypertension: Secondary | ICD-10-CM | POA: Diagnosis not present

## 2018-06-29 DIAGNOSIS — E119 Type 2 diabetes mellitus without complications: Secondary | ICD-10-CM | POA: Diagnosis not present

## 2018-06-29 DIAGNOSIS — Z7409 Other reduced mobility: Secondary | ICD-10-CM | POA: Diagnosis not present

## 2018-07-04 ENCOUNTER — Other Ambulatory Visit: Payer: Self-pay | Admitting: *Deleted

## 2018-07-04 DIAGNOSIS — M329 Systemic lupus erythematosus, unspecified: Secondary | ICD-10-CM | POA: Diagnosis not present

## 2018-07-04 DIAGNOSIS — I38 Endocarditis, valve unspecified: Secondary | ICD-10-CM

## 2018-07-04 DIAGNOSIS — E119 Type 2 diabetes mellitus without complications: Secondary | ICD-10-CM | POA: Diagnosis not present

## 2018-07-04 DIAGNOSIS — M4186 Other forms of scoliosis, lumbar region: Secondary | ICD-10-CM | POA: Diagnosis not present

## 2018-07-04 DIAGNOSIS — I4891 Unspecified atrial fibrillation: Secondary | ICD-10-CM | POA: Diagnosis not present

## 2018-07-04 DIAGNOSIS — Z7409 Other reduced mobility: Secondary | ICD-10-CM | POA: Diagnosis not present

## 2018-07-04 DIAGNOSIS — I5032 Chronic diastolic (congestive) heart failure: Principal | ICD-10-CM

## 2018-07-04 DIAGNOSIS — G3184 Mild cognitive impairment, so stated: Secondary | ICD-10-CM | POA: Diagnosis not present

## 2018-07-04 DIAGNOSIS — R2689 Other abnormalities of gait and mobility: Secondary | ICD-10-CM | POA: Diagnosis not present

## 2018-07-04 DIAGNOSIS — I10 Essential (primary) hypertension: Secondary | ICD-10-CM | POA: Diagnosis not present

## 2018-07-04 DIAGNOSIS — Z9181 History of falling: Secondary | ICD-10-CM | POA: Diagnosis not present

## 2018-07-04 MED ORDER — POTASSIUM CHLORIDE CRYS ER 20 MEQ PO TBCR: 20.0000 meq | EXTENDED_RELEASE_TABLET | Freq: Every day | ORAL | 0 refills | Status: DC

## 2018-07-04 MED ORDER — PREDNISONE 5 MG PO TABS: 7.5000 mg | ORAL_TABLET | Freq: Every day | ORAL | 0 refills | Status: DC

## 2018-07-06 DIAGNOSIS — I4891 Unspecified atrial fibrillation: Secondary | ICD-10-CM | POA: Diagnosis not present

## 2018-07-06 DIAGNOSIS — G3184 Mild cognitive impairment, so stated: Secondary | ICD-10-CM | POA: Diagnosis not present

## 2018-07-06 DIAGNOSIS — Z9181 History of falling: Secondary | ICD-10-CM | POA: Diagnosis not present

## 2018-07-06 DIAGNOSIS — R2689 Other abnormalities of gait and mobility: Secondary | ICD-10-CM | POA: Diagnosis not present

## 2018-07-06 DIAGNOSIS — E119 Type 2 diabetes mellitus without complications: Secondary | ICD-10-CM | POA: Diagnosis not present

## 2018-07-06 DIAGNOSIS — M329 Systemic lupus erythematosus, unspecified: Secondary | ICD-10-CM | POA: Diagnosis not present

## 2018-07-06 DIAGNOSIS — I10 Essential (primary) hypertension: Secondary | ICD-10-CM | POA: Diagnosis not present

## 2018-07-06 DIAGNOSIS — Z7409 Other reduced mobility: Secondary | ICD-10-CM | POA: Diagnosis not present

## 2018-07-06 DIAGNOSIS — M4186 Other forms of scoliosis, lumbar region: Secondary | ICD-10-CM | POA: Diagnosis not present

## 2018-08-09 ENCOUNTER — Encounter: Payer: Self-pay | Admitting: Internal Medicine

## 2018-08-09 ENCOUNTER — Ambulatory Visit (INDEPENDENT_AMBULATORY_CARE_PROVIDER_SITE_OTHER): Payer: Medicare Other | Admitting: Internal Medicine

## 2018-08-09 ENCOUNTER — Other Ambulatory Visit: Payer: Self-pay

## 2018-08-09 VITALS — BP 122/62 | HR 67 | Temp 98.0°F | Ht 70.0 in | Wt 152.1 lb

## 2018-08-09 DIAGNOSIS — R2681 Unsteadiness on feet: Secondary | ICD-10-CM

## 2018-08-09 DIAGNOSIS — H9193 Unspecified hearing loss, bilateral: Secondary | ICD-10-CM

## 2018-08-09 DIAGNOSIS — Z7901 Long term (current) use of anticoagulants: Secondary | ICD-10-CM

## 2018-08-09 DIAGNOSIS — Z7984 Long term (current) use of oral hypoglycemic drugs: Secondary | ICD-10-CM

## 2018-08-09 DIAGNOSIS — R2689 Other abnormalities of gait and mobility: Secondary | ICD-10-CM

## 2018-08-09 DIAGNOSIS — R011 Cardiac murmur, unspecified: Secondary | ICD-10-CM

## 2018-08-09 DIAGNOSIS — Z9181 History of falling: Secondary | ICD-10-CM

## 2018-08-09 DIAGNOSIS — J302 Other seasonal allergic rhinitis: Secondary | ICD-10-CM

## 2018-08-09 DIAGNOSIS — R296 Repeated falls: Secondary | ICD-10-CM

## 2018-08-09 DIAGNOSIS — I4821 Permanent atrial fibrillation: Secondary | ICD-10-CM | POA: Diagnosis not present

## 2018-08-09 DIAGNOSIS — I10 Essential (primary) hypertension: Secondary | ICD-10-CM | POA: Diagnosis not present

## 2018-08-09 DIAGNOSIS — E119 Type 2 diabetes mellitus without complications: Secondary | ICD-10-CM | POA: Diagnosis not present

## 2018-08-09 DIAGNOSIS — Z79899 Other long term (current) drug therapy: Secondary | ICD-10-CM

## 2018-08-09 DIAGNOSIS — R531 Weakness: Secondary | ICD-10-CM

## 2018-08-09 DIAGNOSIS — E1165 Type 2 diabetes mellitus with hyperglycemia: Secondary | ICD-10-CM

## 2018-08-09 DIAGNOSIS — J309 Allergic rhinitis, unspecified: Secondary | ICD-10-CM

## 2018-08-09 LAB — POCT GLYCOSYLATED HEMOGLOBIN (HGB A1C): HEMOGLOBIN A1C: 6.7 % — AB (ref 4.0–5.6)

## 2018-08-09 LAB — GLUCOSE, CAPILLARY: GLUCOSE-CAPILLARY: 111 mg/dL — AB (ref 70–99)

## 2018-08-09 MED ORDER — METFORMIN HCL ER 500 MG PO TB24: 500.0000 mg | ORAL_TABLET | Freq: Two times a day (BID) | ORAL | 3 refills | Status: DC

## 2018-08-09 MED ORDER — FLUTICASONE PROPIONATE 50 MCG/ACT NA SUSP: 1.0000 | Freq: Every day | NASAL | 11 refills | Status: DC

## 2018-08-09 MED ORDER — ZOSTER VAC RECOMB ADJUVANTED 50 MCG/0.5ML IM SUSR: 0.5000 mL | Freq: Once | INTRAMUSCULAR | 0 refills | Status: AC

## 2018-08-09 MED ORDER — APIXABAN 5 MG PO TABS: 5.0000 mg | ORAL_TABLET | Freq: Two times a day (BID) | ORAL | 11 refills | Status: DC

## 2018-08-09 MED ORDER — APIXABAN 5 MG PO TABS: 5.0000 mg | ORAL_TABLET | Freq: Two times a day (BID) | ORAL | 3 refills | Status: DC

## 2018-08-09 NOTE — Assessment & Plan Note (Signed)
Taking medications, no issues we will continue metoprolol tartrate 50 mg twice daily and losartan 100 mg daily.

## 2018-08-09 NOTE — Assessment & Plan Note (Signed)
HPI: Has been having some dripping in the back of her throat as well as some nasal congestion.  Has not been taking Xyzal or other antihistamines.  A: Allergic rhinitis  P: Flonase

## 2018-08-09 NOTE — Progress Notes (Signed)
Subjective:  HPI: [name] is a 77 y.o. female who presents for for follow-up of diabetes, hypertension.  Is accompanied today by her daughter Stephanie Horne.  Please see Assessment and Plan below for the status of her chronic medical problems.  Review of Systems: Review of Systems  Constitutional: Negative for fever.  Eyes: Negative for blurred vision.  Gastrointestinal: Negative for abdominal pain.  Genitourinary: Negative for dysuria and urgency.  Musculoskeletal: Positive for falls (in bedroom, on floor for 2 hours). Negative for myalgias and neck pain.  Neurological: Positive for weakness. Negative for dizziness.    Objective:  Physical Exam: Vitals:   08/09/18 1017  BP: 122/62  Pulse: 67  Temp: 98 F (36.7 C)  TempSrc: Oral  Weight: 152 lb 1.6 oz (69 kg)  Height: 5\' 10"  (1.778 m)   Body mass index is 21.82 kg/m. Physical Exam  Constitutional: She appears well-developed and well-nourished.  In wheelchair  HENT:  Head: Head is with raccoon's eyes.  Mouth/Throat: Mucous membranes are normal. Posterior oropharyngeal edema present.  Swollen inferior turbinates  Cardiovascular: Normal rate.  Murmur heard. Pulmonary/Chest: Effort normal and breath sounds normal.  Abdominal: Soft. Bowel sounds are normal.  Musculoskeletal: She exhibits no edema.  Neurological:  Decreased bilateral hip strength rated at 3+ out of 5  Nursing note and vitals reviewed.  Assessment & Plan:  Essential hypertension Taking medications, no issues we will continue metoprolol tartrate 50 mg twice daily and losartan 100 mg daily.  Atrial fibrillation (HCC) HPI: She continues to take Eliquis for work risk she has not had any bleeding issues.  She has had some difficulty affording this her daughter is helping her apply for pharmaceutical assistance. Requesting paper prescription of Eliquis.  Assessment: Permanent A. Fib  Plan -Continue Eliquis -Continue metoprolol  Hearing loss HPI:  Daughter is concerned about ongoing hearing loss patient does admit that she does have some hearing loss but does fine when she can see the person speaking.  Does not lateralize to either ear no inciting event.  Assessment bilateral hearing loss  Plan referral to audiology  Controlled type 2 diabetes mellitus ([place]) HPI: Doing well with metformin 500 mg extended release twice daily.  She is having some diarrhea for which she takes Imodium.  Overall this is not very bothersome and she is not completely sure whether it is due to the metformin.  Assessment controlled type 2 diabetes on oral medication  Plan Continue metformin 500 mg extended release twice daily  Frequent falls HPI: She is having some increased falls at home.  She previously was working with physical therapy and like to have her back with physical therapy.  She does have some weakness in her lower extremities.  We will also arrange to have a manual wheelchair provided for her.  Assessment: frequent falls, gait instability  Plan: Physical therapy and DME wheelchair  Allergic rhinitis HPI: Has been having some dripping in the back of her throat as well as some nasal congestion.  Has not been taking Xyzal or other antihistamines.  A: Allergic rhinitis  P: Flonase   Medications Ordered Meds ordered this encounter  Medications  . Zoster Vaccine Adjuvanted [facility]) injection    Sig: Inject 0.5 mLs into the muscle once for 1 dose.    Dispense:  0.5 mL    Refill:  0  . DISCONTD: apixaban (ELIQUIS) 5 MG TABS tablet    Sig: Take 1 tablet (5 mg total) by mouth 2 (two) times daily.  Dispense:  180 tablet    Refill:  3  . metFORMIN (GLUCOPHAGE XR) 500 MG 24 hr tablet    Sig: Take 1 tablet (500 mg total) by mouth 2 (two) times daily.    Dispense:  180 tablet    Refill:  3  . apixaban (ELIQUIS) 5 MG TABS tablet    Sig: Take 1 tablet (5 mg total) by mouth 2 (two) times daily.    Dispense:  60 tablet    Refill:  11  .  fluticasone (FLONASE) 50 MCG/ACT nasal spray    Sig: Place 1 spray into both nostrils daily.    Dispense:  16 g    Refill:  11   Other Orders Orders Placed This Encounter  Procedures  . DME Wheelchair manual    Patient suffers from decreased strength in bilateral legs which impairs their ability to perform daily activities like bathing, feeding and toileting in the home.  A walker will not resolve  issue with performing activities of daily living. A wheelchair will allow patient to safely perform daily activities. Patient can safely propel the wheelchair in the home or has a caregiver who can provide assistance.  Accessories: elevating leg rests (ELRs), wheel locks, extensions and anti-tippers.  . Glucose, capillary  . Ambulatory referral to Home Health    Referral Priority:   Routine    Referral Type:   Home Health Care    Referral Reason:   Specialty Services Required    Requested Specialty:   [place]    Number of Visits Requested:   1  . Ambulatory referral to Audiology    Referral Priority:   Routine    Referral Type:   Audiology Exam    Referral Reason:   Specialty Services Required    Number of Visits Requested:   1  . POC Hbg A1C   Follow Up: Return in about 3 months (around 11/09/2018).

## 2018-08-09 NOTE — Assessment & Plan Note (Signed)
HPI: Doing well with metformin 500 mg extended release twice daily.  She is having some diarrhea for which she takes Imodium.  Overall this is not very bothersome and she is not completely sure whether it is due to the metformin.  Assessment controlled type 2 diabetes on oral medication  Plan Continue metformin 500 mg extended release twice daily

## 2018-08-09 NOTE — Assessment & Plan Note (Signed)
HPI: She continues to take Eliquis for work risk she has not had any bleeding issues.  She has had some difficulty affording this her daughter is helping her apply for pharmaceutical assistance. Requesting paper prescription of Eliquis.  Assessment: Permanent A. Fib  Plan -Continue Eliquis -Continue metoprolol

## 2018-08-09 NOTE — Assessment & Plan Note (Addendum)
HPI: She is having some increased falls at home.  She previously was working with physical therapy and like to have her back with physical therapy.  She does have some weakness in her lower extremities.  We will also arrange to have a manual wheelchair provided for her.  Assessment: frequent falls, gait instability  Plan: Physical therapy and DME wheelchair

## 2018-08-09 NOTE — Assessment & Plan Note (Signed)
HPI: Daughter is concerned about ongoing hearing loss patient does admit that she does have some hearing loss but does fine when she can see the person speaking.  Does not lateralize to either ear no inciting event.  Assessment bilateral hearing loss  Plan referral to audiology

## 2018-08-20 DIAGNOSIS — R269 Unspecified abnormalities of gait and mobility: Secondary | ICD-10-CM | POA: Diagnosis not present

## 2018-08-20 DIAGNOSIS — M6281 Muscle weakness (generalized): Secondary | ICD-10-CM | POA: Diagnosis not present

## 2018-08-20 DIAGNOSIS — E119 Type 2 diabetes mellitus without complications: Secondary | ICD-10-CM | POA: Diagnosis not present

## 2018-08-20 DIAGNOSIS — I10 Essential (primary) hypertension: Secondary | ICD-10-CM | POA: Diagnosis not present

## 2018-08-22 DIAGNOSIS — I10 Essential (primary) hypertension: Secondary | ICD-10-CM | POA: Diagnosis not present

## 2018-08-22 DIAGNOSIS — M6281 Muscle weakness (generalized): Secondary | ICD-10-CM | POA: Diagnosis not present

## 2018-08-22 DIAGNOSIS — R269 Unspecified abnormalities of gait and mobility: Secondary | ICD-10-CM | POA: Diagnosis not present

## 2018-08-22 DIAGNOSIS — E119 Type 2 diabetes mellitus without complications: Secondary | ICD-10-CM | POA: Diagnosis not present

## 2018-09-04 DIAGNOSIS — M6281 Muscle weakness (generalized): Secondary | ICD-10-CM | POA: Diagnosis not present

## 2018-09-04 DIAGNOSIS — R269 Unspecified abnormalities of gait and mobility: Secondary | ICD-10-CM | POA: Diagnosis not present

## 2018-09-04 DIAGNOSIS — I10 Essential (primary) hypertension: Secondary | ICD-10-CM | POA: Diagnosis not present

## 2018-09-04 DIAGNOSIS — E119 Type 2 diabetes mellitus without complications: Secondary | ICD-10-CM | POA: Diagnosis not present

## 2018-09-07 DIAGNOSIS — I10 Essential (primary) hypertension: Secondary | ICD-10-CM | POA: Diagnosis not present

## 2018-09-07 DIAGNOSIS — R269 Unspecified abnormalities of gait and mobility: Secondary | ICD-10-CM | POA: Diagnosis not present

## 2018-09-07 DIAGNOSIS — E119 Type 2 diabetes mellitus without complications: Secondary | ICD-10-CM | POA: Diagnosis not present

## 2018-09-07 DIAGNOSIS — M6281 Muscle weakness (generalized): Secondary | ICD-10-CM | POA: Diagnosis not present

## 2018-09-11 DIAGNOSIS — M6281 Muscle weakness (generalized): Secondary | ICD-10-CM | POA: Diagnosis not present

## 2018-09-11 DIAGNOSIS — I10 Essential (primary) hypertension: Secondary | ICD-10-CM | POA: Diagnosis not present

## 2018-09-11 DIAGNOSIS — E119 Type 2 diabetes mellitus without complications: Secondary | ICD-10-CM | POA: Diagnosis not present

## 2018-09-11 DIAGNOSIS — R269 Unspecified abnormalities of gait and mobility: Secondary | ICD-10-CM | POA: Diagnosis not present

## 2018-09-20 DIAGNOSIS — E119 Type 2 diabetes mellitus without complications: Secondary | ICD-10-CM | POA: Diagnosis not present

## 2018-09-20 DIAGNOSIS — R269 Unspecified abnormalities of gait and mobility: Secondary | ICD-10-CM | POA: Diagnosis not present

## 2018-09-20 DIAGNOSIS — I10 Essential (primary) hypertension: Secondary | ICD-10-CM | POA: Diagnosis not present

## 2018-09-20 DIAGNOSIS — M6281 Muscle weakness (generalized): Secondary | ICD-10-CM | POA: Diagnosis not present

## 2018-09-27 DIAGNOSIS — I10 Essential (primary) hypertension: Secondary | ICD-10-CM | POA: Diagnosis not present

## 2018-09-27 DIAGNOSIS — E119 Type 2 diabetes mellitus without complications: Secondary | ICD-10-CM | POA: Diagnosis not present

## 2018-09-27 DIAGNOSIS — R269 Unspecified abnormalities of gait and mobility: Secondary | ICD-10-CM | POA: Diagnosis not present

## 2018-09-27 DIAGNOSIS — M6281 Muscle weakness (generalized): Secondary | ICD-10-CM | POA: Diagnosis not present

## 2018-10-01 DIAGNOSIS — E119 Type 2 diabetes mellitus without complications: Secondary | ICD-10-CM | POA: Diagnosis not present

## 2018-10-01 DIAGNOSIS — M6281 Muscle weakness (generalized): Secondary | ICD-10-CM | POA: Diagnosis not present

## 2018-10-01 DIAGNOSIS — R269 Unspecified abnormalities of gait and mobility: Secondary | ICD-10-CM | POA: Diagnosis not present

## 2018-10-01 DIAGNOSIS — I10 Essential (primary) hypertension: Secondary | ICD-10-CM | POA: Diagnosis not present

## 2018-10-05 DIAGNOSIS — M6281 Muscle weakness (generalized): Secondary | ICD-10-CM | POA: Diagnosis not present

## 2018-10-05 DIAGNOSIS — I10 Essential (primary) hypertension: Secondary | ICD-10-CM | POA: Diagnosis not present

## 2018-10-05 DIAGNOSIS — R269 Unspecified abnormalities of gait and mobility: Secondary | ICD-10-CM | POA: Diagnosis not present

## 2018-10-05 DIAGNOSIS — E119 Type 2 diabetes mellitus without complications: Secondary | ICD-10-CM | POA: Diagnosis not present

## 2018-10-08 DIAGNOSIS — M6281 Muscle weakness (generalized): Secondary | ICD-10-CM | POA: Diagnosis not present

## 2018-10-08 DIAGNOSIS — I10 Essential (primary) hypertension: Secondary | ICD-10-CM | POA: Diagnosis not present

## 2018-10-08 DIAGNOSIS — R269 Unspecified abnormalities of gait and mobility: Secondary | ICD-10-CM | POA: Diagnosis not present

## 2018-10-08 DIAGNOSIS — E119 Type 2 diabetes mellitus without complications: Secondary | ICD-10-CM | POA: Diagnosis not present

## 2018-10-11 DIAGNOSIS — E119 Type 2 diabetes mellitus without complications: Secondary | ICD-10-CM | POA: Diagnosis not present

## 2018-10-11 DIAGNOSIS — M6281 Muscle weakness (generalized): Secondary | ICD-10-CM | POA: Diagnosis not present

## 2018-10-11 DIAGNOSIS — I10 Essential (primary) hypertension: Secondary | ICD-10-CM | POA: Diagnosis not present

## 2018-10-11 DIAGNOSIS — R269 Unspecified abnormalities of gait and mobility: Secondary | ICD-10-CM | POA: Diagnosis not present

## 2018-10-15 DIAGNOSIS — I10 Essential (primary) hypertension: Secondary | ICD-10-CM | POA: Diagnosis not present

## 2018-10-15 DIAGNOSIS — R269 Unspecified abnormalities of gait and mobility: Secondary | ICD-10-CM | POA: Diagnosis not present

## 2018-10-15 DIAGNOSIS — E119 Type 2 diabetes mellitus without complications: Secondary | ICD-10-CM | POA: Diagnosis not present

## 2018-10-15 DIAGNOSIS — M6281 Muscle weakness (generalized): Secondary | ICD-10-CM | POA: Diagnosis not present

## 2018-10-17 DIAGNOSIS — I10 Essential (primary) hypertension: Secondary | ICD-10-CM | POA: Diagnosis not present

## 2018-10-17 DIAGNOSIS — M6281 Muscle weakness (generalized): Secondary | ICD-10-CM | POA: Diagnosis not present

## 2018-10-17 DIAGNOSIS — E119 Type 2 diabetes mellitus without complications: Secondary | ICD-10-CM | POA: Diagnosis not present

## 2018-10-17 DIAGNOSIS — R269 Unspecified abnormalities of gait and mobility: Secondary | ICD-10-CM | POA: Diagnosis not present

## 2018-10-19 ENCOUNTER — Other Ambulatory Visit: Payer: Self-pay | Admitting: *Deleted

## 2018-10-19 DIAGNOSIS — I5032 Chronic diastolic (congestive) heart failure: Principal | ICD-10-CM

## 2018-10-19 DIAGNOSIS — I38 Endocarditis, valve unspecified: Secondary | ICD-10-CM

## 2018-10-19 DIAGNOSIS — I10 Essential (primary) hypertension: Secondary | ICD-10-CM

## 2018-10-19 MED ORDER — PREDNISONE 5 MG PO TABS: 7.5000 mg | ORAL_TABLET | Freq: Every day | ORAL | 1 refills | Status: DC

## 2018-10-19 MED ORDER — FUROSEMIDE 40 MG PO TABS: ORAL_TABLET | ORAL | 1 refills | Status: DC

## 2018-10-19 MED ORDER — METOPROLOL TARTRATE 50 MG PO TABS: ORAL_TABLET | ORAL | 1 refills | Status: DC

## 2018-10-19 MED ORDER — PRAVASTATIN SODIUM 40 MG PO TABS: 40.0000 mg | ORAL_TABLET | Freq: Every day | ORAL | 1 refills | Status: DC

## 2018-10-19 MED ORDER — POTASSIUM CHLORIDE CRYS ER 20 MEQ PO TBCR: 20.0000 meq | EXTENDED_RELEASE_TABLET | Freq: Every day | ORAL | 1 refills | Status: DC

## 2018-10-19 MED ORDER — OMEPRAZOLE 20 MG PO CPDR: 20.0000 mg | DELAYED_RELEASE_CAPSULE | Freq: Every day | ORAL | 1 refills | Status: DC

## 2018-10-19 MED ORDER — LOSARTAN POTASSIUM 100 MG PO TABS: 100.0000 mg | ORAL_TABLET | Freq: Every day | ORAL | 1 refills | Status: DC

## 2018-10-20 ENCOUNTER — Telehealth: Payer: Self-pay | Admitting: Internal Medicine

## 2018-10-20 NOTE — Telephone Encounter (Signed)
Received call from Stephanie Horne who cares for Stephanie Horne. Went to the pharmacy today and picked up 2 medications that she states the patient has never been on, Losartan and pravastatin. I reviewed prior notes and it appears that she was supposed to be on both of these medications dating back to before 2017. Stephanie Horne re-iterates that the patient has not been on these medications.  I recommended that she initiate the pravastatin but hold off on the Losartan since her BP has been well controlled at her last several clinic appointments. She does have indications for a ARB and it would be beneficial for her health. She may need to start a lower dose to prevent hypotension. I will communicate with Dr. Heber [place].

## 2018-10-23 ENCOUNTER — Other Ambulatory Visit: Payer: Self-pay | Admitting: Internal Medicine

## 2018-10-23 DIAGNOSIS — E119 Type 2 diabetes mellitus without complications: Secondary | ICD-10-CM

## 2018-10-31 DIAGNOSIS — H903 Sensorineural hearing loss, bilateral: Secondary | ICD-10-CM | POA: Diagnosis not present

## 2018-11-14 ENCOUNTER — Other Ambulatory Visit: Payer: Self-pay

## 2018-11-14 NOTE — Telephone Encounter (Signed)
Spoke w/ daughter she would like eliquis at [facility], everything else at [facility]. [facility] and will do so

## 2018-11-14 NOTE — Telephone Encounter (Signed)
apixaban (ELIQUIS) 5 MG TABS tablet,  REFILL REQUEST @ [place].

## 2018-11-15 ENCOUNTER — Encounter: Payer: Self-pay | Admitting: Internal Medicine

## 2018-11-15 ENCOUNTER — Ambulatory Visit (INDEPENDENT_AMBULATORY_CARE_PROVIDER_SITE_OTHER): Payer: Medicare Other | Admitting: Internal Medicine

## 2018-11-15 ENCOUNTER — Other Ambulatory Visit: Payer: Self-pay

## 2018-11-15 VITALS — BP 116/59 | HR 69 | Temp 98.4°F | Ht 70.0 in | Wt 155.1 lb

## 2018-11-15 DIAGNOSIS — I272 Pulmonary hypertension, unspecified: Secondary | ICD-10-CM | POA: Diagnosis not present

## 2018-11-15 DIAGNOSIS — Z79899 Other long term (current) drug therapy: Secondary | ICD-10-CM

## 2018-11-15 DIAGNOSIS — I10 Essential (primary) hypertension: Secondary | ICD-10-CM

## 2018-11-15 DIAGNOSIS — I4821 Permanent atrial fibrillation: Secondary | ICD-10-CM | POA: Diagnosis not present

## 2018-11-15 DIAGNOSIS — R413 Other amnesia: Secondary | ICD-10-CM | POA: Diagnosis not present

## 2018-11-15 DIAGNOSIS — R011 Cardiac murmur, unspecified: Secondary | ICD-10-CM

## 2018-11-15 DIAGNOSIS — Z7984 Long term (current) use of oral hypoglycemic drugs: Secondary | ICD-10-CM

## 2018-11-15 DIAGNOSIS — D591 Autoimmune hemolytic anemia, unspecified: Secondary | ICD-10-CM

## 2018-11-15 DIAGNOSIS — Z7901 Long term (current) use of anticoagulants: Secondary | ICD-10-CM

## 2018-11-15 DIAGNOSIS — Z7952 Long term (current) use of systemic steroids: Secondary | ICD-10-CM

## 2018-11-15 DIAGNOSIS — R296 Repeated falls: Secondary | ICD-10-CM

## 2018-11-15 DIAGNOSIS — E119 Type 2 diabetes mellitus without complications: Secondary | ICD-10-CM | POA: Diagnosis not present

## 2018-11-15 DIAGNOSIS — I700 Atherosclerosis of aorta: Secondary | ICD-10-CM

## 2018-11-15 LAB — POCT GLYCOSYLATED HEMOGLOBIN (HGB A1C): HEMOGLOBIN A1C: 6.2 % — AB (ref 4.0–5.6)

## 2018-11-15 LAB — GLUCOSE, CAPILLARY: GLUCOSE-CAPILLARY: 131 mg/dL — AB (ref 70–99)

## 2018-11-15 MED ORDER — APIXABAN 5 MG PO TABS: 5.0000 mg | ORAL_TABLET | Freq: Two times a day (BID) | ORAL | 11 refills | Status: DC

## 2018-11-15 NOTE — Progress Notes (Signed)
Subjective:  HPI: [name] is a 78 y.o. female who presents for for follow-up of diabetes, hypertension.  Is accompanied today by her daughter Stephanie Horne.  Please see Assessment and Plan below for the status of her chronic medical problems.  Review of Systems: Review of Systems  Constitutional: Negative for fever.  HENT: Negative for hearing loss.   Eyes: Negative for blurred vision.  Respiratory: Negative for cough.   Gastrointestinal: Negative for abdominal pain.  Genitourinary: Negative for frequency.  Neurological: Negative for dizziness.  Endo/Heme/Allergies: Negative for polydipsia.    Objective:  Physical Exam: Vitals:   11/15/18 1037  BP: (!) 116/59  Pulse: 69  Temp: 98.4 F (36.9 C)  TempSrc: Oral  SpO2: 100%  Weight: 155 lb 1.6 oz (70.4 kg)  Height: 5\' 10"  (1.778 m)   Body mass index is 22.25 kg/m. Physical Exam Vitals signs and nursing note reviewed.  Constitutional:      Appearance: She is well-developed.     Comments: In wheelchair  Cardiovascular:     Rate and Rhythm: Normal rate.     Heart sounds: Murmur present.  Pulmonary:     Effort: Pulmonary effort is normal.     Breath sounds: Normal breath sounds.  Abdominal:     General: Bowel sounds are normal.     Palpations: Abdomen is soft.    Assessment & Plan:  Essential hypertension HPI: As noted with previous phone note daughter recognized that patient had not been taking her losartan for some time and called in.  She was instructed to continue to hold off losartan until she was seen here.  Her blood pressure continues to be normal without losartan.  She has no other acute complaints.  Assessment essential hypertension at goal  Plan We will have her continue metoprolol tartrate 50 mg twice daily She will remain off losartan I have discontinued this medication on her problem list She will continue to take Lasix 40 mg daily as needed for edema  Atrial fibrillation (HCC) HPI: She is  apparently run out of Eliquis there was some confusion I believe that the last time I provided a printed prescription for them to mail into their mail order patient's daughter and are all little confused about what may have happened to the prescription.  However daughter is now feeling medications at [facility] and we have updated a new prescription there.  Assessment permanent A. Fib  Plan Continue metoprolol tartrate 50 mg twice daily Continue Eliquis 5 mg twice daily Patient is following up with Dr. Burt Knack in March  Pulmonary hypertension [facility]) This is chronic and stable she will follow-up with Dr. Burt Knack in March  Controlled type 2 diabetes mellitus ([place]) HPI: She continues to take her metformin 500 mg twice daily she has no polyuria or polydipsia and feels she is doing well.  Reviewed her glucometer has been checking blood sugars about twice a day grossly within range.  A1c has returned at 6.2% well-controlled  Assessment controlled type 2 diabetes  Plan Continue metformin 500 mg twice daily.  Abdominal aortic atherosclerosis ([place]) Chronic and stable continue pravastatin  Memory loss HPI: Daughter brings up that she is concerned about her mother's memory she seems to be more forgetful.  She is concerned she may have some signs of dementia.  In asking Anasia she feels that her memory is normal she has some trouble with minor things but feels that this is normal for her age she denies any functional limitation with ADLs.  She  denies any forgetting to pay bills she does have some trouble remembering her doctors appointments however her daughter helps with this as well as many of her medications however Stephanie Horne is concerned that she still may not be taking her medications correctly  Assessment: Memory loss with suspected dementia  Plan I performed a Moca on her today she scored an 57 this is concerning for dementia.  I will check a TSH and a B12 as well as an MRI of her brain her last CT  scan over 5 years ago showed some chronic microvascular changes.  Pending this evaluation we may consider starting low-dose Aricept for possible Alzheimer's although vascular dementia certainly also a concern. [place] Cognitive Assessment  11/15/2018  Visuospatial/ Executive (0/5) 2  Naming (0/3) 1  Attention: Read list of digits (0/2) 2  Attention: Read list of letters (0/1) 0  Attention: Serial 7 subtraction starting at 100 (0/3) 0  Language: Repeat phrase (0/2) 0  Language : Fluency (0/1) 0  Abstraction (0/2) 0  Delayed Recall (0/5) 0  Orientation (0/6) 6  Total 11  Adjusted Score (based on education) 11     Autoimmune hemolytic anemia (HCC) HPI no issues in the interim no jaundice or fever.  She remains on prednisone 7.5 mg daily without any subacute issues.  Assessment autoimmune hemolytic anemia  Plan Continue prednisone 7.5 mg daily  Frequent falls She has been very pleased with home health PT they have really helped make her more mobile.  However she reports that they just finished the home health therapy trial.  Patient and daughter are concerned that she is not fully independent yet and would benefit from a little bit more physical therapy.  In reviewing my notes I do not see that I have actually had a formal discharge from home health PT yet I will see if we can track this down and see what the conclusions are.   Medications Ordered Meds ordered this encounter  Medications  . apixaban (ELIQUIS) 5 MG TABS tablet    Sig: Take 1 tablet (5 mg total) by mouth 2 (two) times daily.    Dispense:  60 tablet    Refill:  11  . LORazepam (ATIVAN) 1 MG tablet    Sig: Take 1 tablet (1 mg total) by mouth as needed for sedation (take 30 minutes before MRI).    Dispense:  2 tablet    Refill:  0   Other Orders Orders Placed This Encounter  Procedures  . MR Brain Wo Contrast    Standing Status:   Future    Standing Expiration Date:   01/14/2020    Order Specific Question:   What  is the patient's sedation requirement?    Answer:   Anti-anxiety    Order Specific Question:   Does the patient have a pacemaker or implanted devices?    Answer:   No    Order Specific Question:   Preferred imaging location?    Answer:   [facility] (table limit-500 lbs)    Order Specific Question:   Radiology Contrast Protocol - do NOT remove file path    Answer:   [email or web address]  . Glucose, capillary  . TSH  . Vitamin B12  . BMP8+Anion Gap  . Lipid Profile  . POC Hbg A1C   Follow Up: Return in about 3 months (around 02/13/2019).

## 2018-11-15 NOTE — Telephone Encounter (Signed)
Dan with [facility] requesting a refill on      apixaban (ELIQUIS) 5 MG TABS tablet

## 2018-11-16 LAB — BMP8+ANION GAP
Anion Gap: 15 mmol/L (ref 10.0–18.0)
BUN/Creatinine Ratio: 11 — ABNORMAL LOW (ref 12–28)
BUN: 9 mg/dL (ref 8–27)
CO2: 22 mmol/L (ref 20–29)
Calcium: 9.1 mg/dL (ref 8.7–10.3)
Chloride: 107 mmol/L — ABNORMAL HIGH (ref 96–106)
Creatinine, Ser: 0.8 mg/dL (ref 0.57–1.00)
GFR calc Af Amer: 81 mL/min/{1.73_m2} (ref >59)
GFR calc non Af Amer: 70 mL/min/{1.73_m2} (ref >59)
Glucose: 124 mg/dL — ABNORMAL HIGH (ref 65–99)
Potassium: 4.3 mmol/L (ref 3.5–5.2)
Sodium: 144 mmol/L (ref 134–144)

## 2018-11-16 LAB — LIPID PANEL
Chol/HDL Ratio: 2.1 ratio (ref 0.0–4.4)
Cholesterol, Total: 128 mg/dL (ref 100–199)
HDL: 61 mg/dL (ref >39)
LDL Calculated: 53 mg/dL (ref 0–99)
Triglycerides: 72 mg/dL (ref 0–149)
VLDL CHOLESTEROL CAL: 14 mg/dL (ref 5–40)

## 2018-11-16 LAB — TSH: TSH: 1.29 u[IU]/mL (ref 0.450–4.500)

## 2018-11-16 LAB — VITAMIN B12: Vitamin B-12: 305 pg/mL (ref 232–1245)

## 2018-11-16 MED ORDER — LORAZEPAM 1 MG PO TABS: 1.0000 mg | ORAL_TABLET | ORAL | 0 refills | Status: DC | PRN

## 2018-11-16 NOTE — Assessment & Plan Note (Addendum)
HPI: Daughter brings up that she is concerned about her mother's memory she seems to be more forgetful.  She is concerned she may have some signs of dementia.  In asking Stephanie Horne she feels that her memory is normal she has some trouble with minor things but feels that this is normal for her age she denies any functional limitation with ADLs.  She denies any forgetting to pay bills she does have some trouble remembering her doctors appointments however her daughter helps with this as well as many of her medications however [name] is concerned that she still may not be taking her medications correctly  Assessment: Memory loss with suspected dementia  Plan I performed a Moca on her today she scored an 77 this is concerning for dementia.  I will check a TSH and a B12 as well as an MRI of her brain her last CT scan over 5 years ago showed some chronic microvascular changes.  Pending this evaluation we may consider starting low-dose Aricept for possible Alzheimer's although vascular dementia certainly also a concern. [place] Cognitive Assessment  11/15/2018  Visuospatial/ Executive (0/5) 2  Naming (0/3) 1  Attention: Read list of digits (0/2) 2  Attention: Read list of letters (0/1) 0  Attention: Serial 7 subtraction starting at 100 (0/3) 0  Language: Repeat phrase (0/2) 0  Language : Fluency (0/1) 0  Abstraction (0/2) 0  Delayed Recall (0/5) 0  Orientation (0/6) 6  Total 11  Adjusted Score (based on education) 11

## 2018-11-16 NOTE — Assessment & Plan Note (Signed)
HPI no issues in the interim no jaundice or fever.  She remains on prednisone 7.5 mg daily without any subacute issues.  Assessment autoimmune hemolytic anemia  Plan Continue prednisone 7.5 mg daily

## 2018-11-16 NOTE — Assessment & Plan Note (Addendum)
HPI: She continues to take her metformin 500 mg twice daily she has no polyuria or polydipsia and feels she is doing well.  Reviewed her glucometer has been checking blood sugars about twice a day grossly within range.  A1c has returned at 6.2% well-controlled  Assessment controlled type 2 diabetes  Plan Continue metformin 500 mg twice daily.

## 2018-11-16 NOTE — Assessment & Plan Note (Signed)
She has been very pleased with home health PT they have really helped make her more mobile.  However she reports that they just finished the home health therapy trial.  Patient and daughter are concerned that she is not fully independent yet and would benefit from a little bit more physical therapy.  In reviewing my notes I do not see that I have actually had a formal discharge from home health PT yet I will see if we can track this down and see what the conclusions are.

## 2018-11-16 NOTE — Assessment & Plan Note (Signed)
Chronic and stable continue pravastatin. 

## 2018-11-16 NOTE — Assessment & Plan Note (Signed)
HPI: As noted with previous phone note daughter recognized that patient had not been taking her losartan for some time and called in.  She was instructed to continue to hold off losartan until she was seen here.  Her blood pressure continues to be normal without losartan.  She has no other acute complaints.  Assessment essential hypertension at goal  Plan We will have her continue metoprolol tartrate 50 mg twice daily She will remain off losartan I have discontinued this medication on her problem list She will continue to take Lasix 40 mg daily as needed for edema

## 2018-11-16 NOTE — Assessment & Plan Note (Signed)
HPI: She is apparently run out of Eliquis there was some confusion I believe that the last time I provided a printed prescription for them to mail into their mail order patient's daughter and are all little confused about what may have happened to the prescription.  However daughter is now feeling medications at [facility] and we have updated a new prescription there.  Assessment permanent A. Fib  Plan Continue metoprolol tartrate 50 mg twice daily Continue Eliquis 5 mg twice daily Patient is following up with Dr. Burt Knack in March

## 2018-11-16 NOTE — Assessment & Plan Note (Signed)
This is chronic and stable she will follow-up with Dr. Burt Knack in March

## 2018-11-20 ENCOUNTER — Telehealth: Payer: Self-pay

## 2018-11-20 NOTE — Telephone Encounter (Signed)
Returned call to emergency contact, [name].  She states pt is having MRI on 11/27/18 , dementia , and is requesting xanax or valium (one time dose) to relax pt before MRI. Thank you, SChaplin, RN,BSN

## 2018-11-20 NOTE — Telephone Encounter (Signed)
I have already sent a RX for Ativan which is similar to Xanax and Valium but I think it will be better than those (Xanax too short acting and Valium too long acting) I sent two doses.

## 2018-11-20 NOTE — Telephone Encounter (Signed)
Needs to speak with a nurse about getting med, before going for MRI 11/30/2018.

## 2018-11-27 ENCOUNTER — Other Ambulatory Visit: Payer: Self-pay

## 2018-11-27 NOTE — Telephone Encounter (Signed)
Needs to speak with a nurse about   traZODone (DESYREL) 50 MG tablet   Please call back.

## 2018-11-28 MED ORDER — TRAZODONE HCL 50 MG PO TABS: 50.0000 mg | ORAL_TABLET | Freq: Every evening | ORAL | 1 refills | Status: DC | PRN

## 2018-11-29 ENCOUNTER — Telehealth: Payer: Self-pay | Admitting: Internal Medicine

## 2018-11-30 ENCOUNTER — Ambulatory Visit (HOSPITAL_COMMUNITY)
Admission: RE | Admit: 2018-11-30 | Discharge: 2018-11-30 | Disposition: A | Discharge status: Home / Self Care | Payer: Medicare Other | Source: Ambulatory Visit | Attending: Internal Medicine | Admitting: Internal Medicine

## 2018-11-30 DIAGNOSIS — R413 Other amnesia: Secondary | ICD-10-CM | POA: Insufficient documentation

## 2018-12-03 ENCOUNTER — Other Ambulatory Visit: Payer: Self-pay | Admitting: Internal Medicine

## 2018-12-03 DIAGNOSIS — F0390 Unspecified dementia without behavioral disturbance: Secondary | ICD-10-CM

## 2018-12-03 DIAGNOSIS — F03B0 Unspecified dementia, moderate, without behavioral disturbance, psychotic disturbance, mood disturbance, and anxiety: Secondary | ICD-10-CM

## 2018-12-03 MED ORDER — DONEPEZIL HCL 5 MG PO TABS: 5.0000 mg | ORAL_TABLET | Freq: Every day | ORAL | 3 refills | Status: DC

## 2018-12-03 NOTE — Progress Notes (Signed)
Reviewed MRI and lab results with [name], I suspect this is Alzhemiers dementia, will start trial of Aricept 5mg  daily.  Reviewed common side effects to watch out for.

## 2018-12-03 NOTE — Assessment & Plan Note (Signed)
Start aricept 5mg  daily, reviewed with Daughter tara

## 2018-12-10 ENCOUNTER — Ambulatory Visit: Payer: Medicare Other | Admitting: Cardiovascular Disease

## 2018-12-19 ENCOUNTER — Telehealth: Payer: Self-pay

## 2018-12-19 NOTE — Telephone Encounter (Signed)
The patient's daughter returned call. She states the patient is fine right now and will reschedule. She understands she will be called to reschedule appointment once COVID-19 precautions are lifted. She was grateful for assistance.

## 2018-12-19 NOTE — Telephone Encounter (Signed)
Called patient to see how she is doing and to attempt to reschedule her 3/25 appointment to a later date.  Left message to call back.

## 2018-12-26 ENCOUNTER — Ambulatory Visit: Payer: Medicare Other | Admitting: Cardiovascular Disease

## 2019-01-03 ENCOUNTER — Telehealth: Payer: Self-pay | Admitting: Cardiovascular Disease

## 2019-01-03 NOTE — Telephone Encounter (Signed)
Spoke with patient this morning to set up appt for Dr Burt Knack 01/04/19 10a - patient does not have computer and her cellular phone is not working at this time.   Patient agreed to phone visit, she does not have bp machine or scale.     Virtual Visit Pre-Appointment Phone Call  Steps For Call:  1. Confirm consent - "In the setting of the current Covid19 crisis, you are scheduled for a (phone or video) visit with your provider on (date) at (time).  Just as we do with many in-office visits, in order for you to participate in this visit, we must obtain consent.  If you'd like, I can send this to your mychart (if signed up) or email for you to review.  Otherwise, I can obtain your verbal consent now.  All virtual visits are billed to your insurance company just like a normal visit would be.  By agreeing to a virtual visit, we'd like you to understand that the technology does not allow for your provider to perform an examination, and thus may limit your provider's ability to fully assess your condition.  Finally, though the technology is pretty good, we cannot assure that it will always work on either your or our end, and in the setting of a video visit, we may have to convert it to a phone-only visit.  In either situation, we cannot ensure that we have a secure connection.  Are you willing to proceed?"  2. Give patient instructions for WebEx download to smartphone as below if video visit  3. Advise patient to be prepared with any vital sign or heart rhythm information, their current medicines, and a piece of paper and pen handy for any instructions they may receive the day of their visit  4. Inform patient they will receive a phone call 15 minutes prior to their appointment time (may be from unknown caller ID) so they should be prepared to answer  5. Confirm that appointment type is correct in Epic appointment notes (video vs telephone)    TELEPHONE CALL NOTE  Damonique TINESHIA BECRAFT has been deemed a  candidate for a follow-up tele-health visit to limit community exposure during the Covid-19 pandemic. I spoke with the patient via phone to ensure availability of phone/video source, confirm preferred email & phone number, and discuss instructions and expectations.  I reminded Cassi NILLIE BARTOLOTTA to be prepared with any vital sign and/or heart rhythm information that could potentially be obtained via home monitoring, at the time of her visit. I reminded KAYA POTTENGER to expect a phone call at the time of her visit if her visit.  Did the patient verbally acknowledge consent to treatment? Yes  Howie Ill 01/03/2019 9:16 AM

## 2019-01-04 ENCOUNTER — Encounter: Payer: Self-pay | Admitting: Cardiovascular Disease

## 2019-01-04 ENCOUNTER — Telehealth (INDEPENDENT_AMBULATORY_CARE_PROVIDER_SITE_OTHER): Payer: Medicare Other | Admitting: Cardiovascular Disease

## 2019-01-04 ENCOUNTER — Other Ambulatory Visit: Payer: Self-pay

## 2019-01-04 DIAGNOSIS — I340 Nonrheumatic mitral (valve) insufficiency: Secondary | ICD-10-CM | POA: Diagnosis not present

## 2019-01-04 DIAGNOSIS — I4821 Permanent atrial fibrillation: Secondary | ICD-10-CM | POA: Diagnosis not present

## 2019-01-04 NOTE — Progress Notes (Signed)
Virtual Visit via Telephone Note    Evaluation Performed:  Follow-up visit  This visit type was conducted due to national recommendations for restrictions regarding the COVID-19 Pandemic (e.g. social distancing).  This format is felt to be most appropriate for this patient at this time.  All issues noted in this document were discussed and addressed.  No physical exam was performed (except for noted visual exam findings with Video Visits).  Please refer to the patient's chart (MyChart message for video visits and phone note for telephone visits) for the patient's consent to telehealth for [facility].  Date:  01/04/2019   ID:  Stephanie Horne, DOB 04-23-1941, MRN [id number]  Patient Location:  home  Provider location:   home  PCP:  Stephanie Groves, DO  Cardiologist:  No primary care provider on file.  Electrophysiologist:  None   Chief Complaint:  Valvular heart disease  History of Present Illness:    Stephanie Horne is a 78 y.o. female who presents via audio/video conferencing for a telehealth visit today.  The patient does not have technology to do a video conference, so a telephone conference is done.   The patient does not have symptoms concerning for COVID-19 infection (fever, chills, cough, or new shortness of breath).   She has been followed for permanent atrial fibrillation, mitral valve regurgitation, and chronic diastolic heart failure.  She has been evaluated by cardiac surgery and felt to be a poor candidate for any heart operation.  She underwent attempted mitral valve edge to edge approximation via a percutaneous approach but this was unsuccessful related to anatomic changes with her heart from severe kyphoscoliosis.  She has been been managed medically and now is chronically anticoagulated with apixaban.  The patient has been self-isolating during the Covid-19 pandemic. Today, she denies symptoms of palpitations, chest pain, shortness of breath, orthopnea, PND, lower  extremity edema, dizziness, or syncope.   Prior CV studies:   The following studies were reviewed today:  TEE 2017: Study Conclusions  - Left ventricle: The cavity size was normal. Wall thickness was   increased in a pattern of mild LVH. Systolic function was normal.   The estimated ejection fraction was in the range of 60% to 65%.   Wall motion was normal; there were no regional wall motion   abnormalities. - Aortic valve: There was no stenosis. - Aorta: Normal caliber aorta with mild plaque. - Mitral valve: There was severe mitral regurgitation with two jets   noted, one larger than the other. PISA was done on larger jet,   ERO 0.42 cm^2. There was severe prolapse but not flail of the   A1/A2 segments of the anterior leaflet. This is where the mitral   regurgitation originated. There was systolic flattening of the   pulmonary vein PW doppler tracing. - Left atrium: The atrium was severely dilated. No evidence of   thrombus in the atrial cavity or appendage. - Right ventricle: The cavity size was normal. Systolic function   was normal. - Right atrium: The atrium was mildly dilated. - Atrial septum: No defect or patent foramen ovale was identified.   Echo contrast study showed no right-to-left atrial level shunt,   at baseline or with provocation. - Tricuspid valve: There was moderate regurgitation. Peak RV-RA   gradient (S): 51 mm Hg.  Cardiac catheterization 2017: Conclusion    Mid RCA lesion, 20% stenosed.  Ost LAD to Prox LAD lesion, 25% stenosed.   1. Patent coronary arteries with  mild nonobstructive CAD 2. Normal LV systolic function 3. Severe mitral regurgitation 4. Moderate pulmonary HTN 5. Patent abdominal aorta and iliac arteries (severe scoliosis noted)  Continue evaluation for mitral valve surgery versus Mitra-Clip    Past Medical History:  Diagnosis Date  . AIHA (autoimmune hemolytic anemia) ([place])    12/08 onset  . Atrial fibrillation ([place])    not  on coumidin because AIHA and major bleeding complications  . Axillary lymphadenopathy    left negative MMG 1/08  . Back pain, chronic   . Complicated grief 7/61/9509   Death of husband in 01-04-2011   . Diabetes mellitus   . Hyperlipidemia   . Hypertension   . Lupus ([place])    Treated with prednisone indefinitely   . Mitral regurgitation   . Mitral regurgitation 10/24/2006   2d Echo from 01-04-08 - EF 60%, Moderated MR    . Seasonal allergies    Past Surgical History:  Procedure Laterality Date  . CARDIAC CATHETERIZATION N/A 01/07/2016   Procedure: Right/Left Heart Cath and Coronary Angiography;  Surgeon: Stephanie Mocha, MD;  Location: [place] CV [facility];  Service: Cardiovascular;  Laterality: N/A;  . EYE SURGERY     cataract in both eyes  . TEE WITHOUT CARDIOVERSION N/A 12/09/2015   Procedure: TRANSESOPHAGEAL ECHOCARDIOGRAM (TEE);  Surgeon: Stephanie Dresser, MD;  Location: [facility] ENDOSCOPY;  Service: Cardiovascular;  Laterality: N/A;     Current Meds  Medication Sig  . apixaban (ELIQUIS) 5 MG TABS tablet Take 1 tablet (5 mg total) by mouth 2 (two) times daily.  . Calcium Carbonate-Vitamin D (CALTRATE 600+D) 600-400 MG-UNIT per tablet Take 1 tablet by mouth 3 (three) times daily with meals.  . donepezil (ARICEPT) 5 MG tablet Take 1 tablet (5 mg total) by mouth at bedtime.  . furosemide (LASIX) 40 MG tablet TAKE 1 TABLET (40 MG TOTAL) BY MOUTH DAILY.  Marland Kitchen glucose blood test strip USE TO chech blood sugar UP TO TWICE DAILY  . levocetirizine (XYZAL) 5 MG tablet TAKE 0.5 TABLETS (2.5 MG TOTAL) BY MOUTH EVERY EVENING.  . metFORMIN (GLUCOPHAGE XR) 500 MG 24 hr tablet Take 1 tablet (500 mg total) by mouth 2 (two) times daily.  . metoprolol tartrate (LOPRESSOR) 50 MG tablet TAKE 1 TABLET (50 MG TOTAL) BY MOUTH 2 (TWO) TIMES DAILY.  . nitroGLYCERIN (NITROSTAT) 0.4 MG SL tablet Place 1 tablet (0.4 mg total) under the tongue every 5 (five) minutes as needed for chest pain.  Marland Kitchen omeprazole (PRILOSEC) 20 MG capsule  Take 1 capsule (20 mg total) by mouth daily.  Stephanie Horne DELICA LANCETS 32I MISC USE TO check UP TO TWICE DAILY E119  . Polyethyl Glycol-Propyl Glycol (SYSTANE OP) Place 1 drop into both eyes daily.  . potassium chloride SA (K-DUR,KLOR-CON) 20 MEQ tablet Take 1 tablet (20 mEq total) by mouth daily.  . pravastatin (PRAVACHOL) 40 MG tablet Take 1 tablet (40 mg total) by mouth daily.  . predniSONE (DELTASONE) 5 MG tablet Take 1.5 tablets (7.5 mg total) by mouth daily.  . traZODone (DESYREL) 50 MG tablet Take 1 tablet (50 mg total) by mouth at bedtime as needed for sleep.     Allergies:   Metformin and related; Lisinopril; and Pollen extract   Social History   Tobacco Use  . Smoking status: Former Smoker    Types: Cigarettes  . Smokeless tobacco: Never Used  Substance Use Topics  . Alcohol use: No    Alcohol/week: 0.0 standard drinks  . Drug use: No  Family Hx: The patient's family history includes Diabetes in her daughter; Heart disease in her brother, brother, mother, and sister; Lupus in her daughter.  ROS:   Please see the history of present illness.    All other systems reviewed and are negative.   Labs/Other Tests and Data Reviewed:    Recent Labs: 05/10/2018: Hemoglobin 11.6; Platelets 223 11/15/2018: BUN 9; Creatinine, Ser 0.80; Potassium 4.3; Sodium 144; TSH 1.290   Recent Lipid Panel Lab Results  Component Value Date/Time   CHOL 128 11/15/2018 11:44 AM   TRIG 72 11/15/2018 11:44 AM   HDL 61 11/15/2018 11:44 AM   CHOLHDL 2.1 11/15/2018 11:44 AM   CHOLHDL 3.1 06/04/2014 02:18 PM   LDLCALC 53 11/15/2018 11:44 AM    Wt Readings from Last 3 Encounters:  11/15/18 155 lb 1.6 oz (70.4 kg)  08/09/18 152 lb 1.6 oz (69 kg)  05/10/18 161 lb 11.2 oz (73.3 kg)     Objective:    Vital Signs:  Ht 5\' 10"  (1.778 m)   BMI 22.25 kg/m    The patient is breathing comfortably during our discussion.  She is in no distress.  Remainder exam not performed as this is a telephone  encounter.  ASSESSMENT & PLAN:    1.  Severe mitral regurgitation: The patient has severe primary mitral regurgitation with bileaflet prolapse.  She appears to have New [place] Heart Association functional class II symptoms of chronic diastolic heart failure.  I reviewed her medications today and no changes are recommended.  She seems to be doing quite well.  2.  Permanent atrial fibrillation: Tolerating anticoagulation with apixaban.  No bleeding problems reported.  No changes recommended.  3.  Essential hypertension: Antihypertensive drugs reviewed.  Unable to obtain a blood pressure today as this is a remote visit and the patient does not have a blood pressure cuff.  COVID-19 Education: The signs and symptoms of COVID-19 were discussed with the patient and how to seek care for testing (follow up with PCP or arrange E-visit). The importance of social distancing was discussed today.  Patient Risk:   After full review of this patient's clinical status, I feel that they are at least moderate risk at this time.  Time:   Today, I have spent 12 minutes with the patient with telehealth technology discussing issues above.     Medication Adjustments/Labs and Tests Ordered: Current medicines are reviewed at length with the patient today.  Concerns regarding medicines are outlined above.  Tests Ordered: No orders of the defined types were placed in this encounter.  Medication Changes: No orders of the defined types were placed in this encounter.   Disposition:  Follow up in 1 year(s)  Signed, Stephanie Mocha, MD  01/04/2019 2:12 PM    [facility]

## 2019-02-12 ENCOUNTER — Other Ambulatory Visit: Payer: Self-pay

## 2019-02-12 NOTE — Patient Outreach (Signed)
[facility]) Care Management  02/12/2019  DELAINY MCELHINEY June 27, 1941 [id number]   Medication Adherence call to Mrs. Sarah Ann Compliant Voice message left with a call back number. Mrs. Menter is showing past due on Losartan 100 mg under [place].   [place] Management Direct Dial [phone]  Fax [phone] [email or web address]

## 2019-02-14 ENCOUNTER — Ambulatory Visit (INDEPENDENT_AMBULATORY_CARE_PROVIDER_SITE_OTHER): Payer: Medicare Other | Admitting: Internal Medicine

## 2019-02-14 ENCOUNTER — Other Ambulatory Visit: Payer: Self-pay

## 2019-02-14 DIAGNOSIS — J302 Other seasonal allergic rhinitis: Secondary | ICD-10-CM | POA: Diagnosis not present

## 2019-02-14 DIAGNOSIS — I10 Essential (primary) hypertension: Secondary | ICD-10-CM | POA: Diagnosis not present

## 2019-02-14 DIAGNOSIS — Z862 Personal history of diseases of the blood and blood-forming organs and certain disorders involving the immune mechanism: Secondary | ICD-10-CM | POA: Diagnosis not present

## 2019-02-14 DIAGNOSIS — E119 Type 2 diabetes mellitus without complications: Secondary | ICD-10-CM

## 2019-02-14 DIAGNOSIS — F0390 Unspecified dementia without behavioral disturbance: Secondary | ICD-10-CM

## 2019-02-14 DIAGNOSIS — F03B0 Unspecified dementia, moderate, without behavioral disturbance, psychotic disturbance, mood disturbance, and anxiety: Secondary | ICD-10-CM

## 2019-02-14 MED ORDER — RIVASTIGMINE 4.6 MG/24HR TD PT24: 4.6000 mg | MEDICATED_PATCH | Freq: Every day | TRANSDERMAL | 11 refills | Status: DC

## 2019-02-14 NOTE — Assessment & Plan Note (Signed)
HPI: No issues  Assessment essential hypertension   Plan continue metoprolol tartrate 50 mg twice daily continue to take Lasix 40 mg daily as needed for edema

## 2019-02-14 NOTE — Assessment & Plan Note (Signed)
Patient and daughter have not seen any improvement in memory on Aricept.  Daughter has noticed that she has had almost a constant runny nose.  Assessment Alzheimer's dementia  Plan Discussed that if the runny nose is particular bothersome we can try to change to Exelon patch to see if that relieves his symptoms.  I am not sure if her insurance company will cover this if not we will try Namenda

## 2019-02-14 NOTE — Progress Notes (Signed)
  [facility] Health Internal Medicine Residency Telephone Encounter Continuity Care Appointment  HPI:   This telephone encounter was created for [name] on 02/14/2019 for the following purpose/cc f/u Alz dementia, HTN, DM.   Past Medical History:  Past Medical History:  Diagnosis Date  . AIHA (autoimmune hemolytic anemia) ([place])    12/08 onset  . Atrial fibrillation ([place])    not on coumidin because AIHA and major bleeding complications  . Axillary lymphadenopathy    left negative MMG 1/08  . Back pain, chronic   . Complicated grief 4/76/5465   Death of husband in 2011-01-12   . Diabetes mellitus   . Hyperlipidemia   . Hypertension   . Lupus ([place])    Treated with prednisone indefinitely   . Mitral regurgitation   . Mitral regurgitation 10/24/2006   2d Echo from 01/12/2008 - EF 60%, Moderated MR    . Seasonal allergies       ROS:   "Nothing, I feel fine" No fever, chills, SOB, Chest pain.   Assessment / Plan / Recommendations:   Please see A&P under problem oriented charting for assessment of the patient's acute and chronic medical conditions.   As always, pt is advised that if symptoms worsen or new symptoms arise, they should go to an urgent care facility or to to ER for further evaluation.   Consent and Medical Decision Making:     This is a telephone encounter between [place] and Lucious Groves on 02/14/2019 for dementia, HTN, DM. The visit was conducted with the patient located at home and Lucious Groves at [facility]. The patient's identity was confirmed using their DOB and current address. The patient has consented to being evaluated through a telephone encounter and understands the associated risks (an examination cannot be done and the patient may need to come in for an appointment) / benefits (allows the patient to remain at home, decreasing exposure to coronavirus). I personally spent 15 minutes on medical discussion.   After I spoke with Stephanie Horne, I asked for her  permission to speak with her daugther and caregiver Stephanie Horne, who I also spoke with to corroborate information.

## 2019-02-14 NOTE — Assessment & Plan Note (Signed)
HPI: She has been taking her antihistamines for possible allergic rhinitis.  However daughter has noticed that her nose now runs almost constantly.  Assessment allergic rhinitis, with nonallergic rhinitis  Plan May continue Xyzal or over-the-counter antihistamines.  Discussed that I think that her worsening runny nose is likely a side effect of Aricept which was started for her dementia.  We will try to change to Exelon patch if covered by insurance if not we may try Namenda.

## 2019-02-14 NOTE — Assessment & Plan Note (Signed)
No issues taking prednisone 7.5 mg daily.

## 2019-02-14 NOTE — Assessment & Plan Note (Signed)
HPI: She continues to take her metformin 500 mg twice daily she has no polyuria or polydipsia and feels she is doing well.  She reports her sugars have been well controlled last sugar reading was 130s.  Assessment controlled type 2 diabetes  Plan Continue metformin 500 mg twice daily.

## 2019-02-18 ENCOUNTER — Encounter: Payer: Self-pay | Admitting: Internal Medicine

## 2019-02-18 ENCOUNTER — Telehealth: Payer: Self-pay | Admitting: *Deleted

## 2019-02-18 NOTE — Telephone Encounter (Signed)
Information was faxed to [facility] after completion of paperwork per Dr. Heber [place].  Awaiting determination.  Sander Nephew, RN 02/18/2019 1:56 PM.

## 2019-02-20 NOTE — Telephone Encounter (Signed)
Prior Josem Kaufmann is requiring oral generic rivastigamine tablet trial first. I called  [name] (Sayaka's daugheter to discuss), I do not expect the oral pill to decrease symptoms much from the previous aricept 5mg . The other option would be to go ahead and start namenda.  In discussing these things she hates to have to change multiple things right now. She will try to continue with aricept and monitor symptoms closer, try to make sure she takes her antihistamine for allergic rhinnitis as well.  Will cancel the exelon patch Rx for now.

## 2019-03-22 ENCOUNTER — Other Ambulatory Visit: Payer: Self-pay

## 2019-03-22 NOTE — Patient Outreach (Signed)
[facility]) Care Management  03/22/2019  FAATIMAH SPIELBERG September 04, 1941 [id number]   Medication Adherence call to Mrs. [place] with patient she is due on Losartan 100 mg patient explain her daughter take care of all her medication patient did not know what medications she is taking ,daughter was not home.Mrs. Enerson is showing past due under [place].   [place] Management Direct Dial [phone]  Fax [phone] [email or web address]

## 2019-03-27 ENCOUNTER — Telehealth: Payer: Self-pay | Admitting: Internal Medicine

## 2019-03-27 NOTE — Telephone Encounter (Signed)
Spoke with tara (daughter), dinnahhas tried xyzal, claritin and zyrtec without relief.  Has not tried nasal spray> gives her nose bleeds.  I disucssed again this may be side effect of the aricept, recommended trial off aricept if improved then we will change to namenda, she will call back in 1-2 weeks to let me know.

## 2019-03-27 NOTE — Telephone Encounter (Signed)
Pt daughter calling to report  pt has congestion and runny nose x 1 month and is requesting a medication be called in.  Pt's taking medications OTC and it is not working.  Please call back .

## 2019-04-15 ENCOUNTER — Other Ambulatory Visit: Payer: Self-pay | Admitting: *Deleted

## 2019-04-15 ENCOUNTER — Other Ambulatory Visit: Payer: Self-pay | Admitting: Internal Medicine

## 2019-04-15 DIAGNOSIS — E119 Type 2 diabetes mellitus without complications: Secondary | ICD-10-CM

## 2019-04-15 MED ORDER — GLUCOSE BLOOD VI STRP: ORAL_STRIP | 1 refills | Status: AC

## 2019-04-15 NOTE — Telephone Encounter (Signed)
Refill request from [facility] for DM test strips.  Call made to patient to confirm pharmacy as there is mulptiple pharmacies listed on chart-dtr not in at this time, but patient "thinks" it is [facility] information [facility] manager.  Will send refill to pcp for review.Marland KitchenMarland KitchenDespina Hidden Cassady7/13/202010:53 AM

## 2019-04-16 ENCOUNTER — Other Ambulatory Visit: Payer: Self-pay | Admitting: Internal Medicine

## 2019-04-16 DIAGNOSIS — I10 Essential (primary) hypertension: Secondary | ICD-10-CM

## 2019-04-16 NOTE — Telephone Encounter (Signed)
Pt's [name] calling for Med refill   pravastatin (PRAVACHOL) 40 MG tablet   [id number] - [place], [place]

## 2019-04-17 MED ORDER — PRAVASTATIN SODIUM 40 MG PO TABS: 40.0000 mg | ORAL_TABLET | Freq: Every day | ORAL | 3 refills | Status: DC

## 2019-05-06 ENCOUNTER — Telehealth: Payer: Self-pay

## 2019-05-06 DIAGNOSIS — I4821 Permanent atrial fibrillation: Secondary | ICD-10-CM

## 2019-05-06 NOTE — Telephone Encounter (Signed)
Pt's daughter requesting a sample on apixaban (ELIQUIS) 5 MG TABS tablet. Please call back.

## 2019-05-06 NOTE — Telephone Encounter (Signed)
Called pt's daughter - stated when she went to pick up med for her mother, Eliquis was $40.00 but now it's 125.00 which she cannot afford.  I will ask Dr Maudie Mercury to help. Thanks

## 2019-05-07 ENCOUNTER — Other Ambulatory Visit: Payer: Self-pay | Admitting: *Deleted

## 2019-05-08 MED ORDER — PREDNISONE 5 MG PO TABS: 7.5000 mg | ORAL_TABLET | Freq: Every day | ORAL | 1 refills | Status: DC

## 2019-05-08 NOTE — Addendum Note (Signed)
Addended by: Forde Dandy on: 05/08/2019 03:24 PM   Modules accepted: Orders

## 2019-05-08 NOTE — Telephone Encounter (Signed)
Thanks Jen

## 2019-05-08 NOTE — Telephone Encounter (Signed)
Patient scheduled for 8/6 to pick up samples (will also refer to [facility] for help with patient assistance program applications)

## 2019-05-09 ENCOUNTER — Other Ambulatory Visit: Payer: Self-pay

## 2019-05-09 ENCOUNTER — Ambulatory Visit: Payer: Medicare Other | Admitting: Pharmacist

## 2019-05-13 ENCOUNTER — Other Ambulatory Visit: Payer: Self-pay

## 2019-05-13 ENCOUNTER — Encounter: Payer: Self-pay | Admitting: *Deleted

## 2019-05-13 ENCOUNTER — Other Ambulatory Visit: Payer: Self-pay | Admitting: *Deleted

## 2019-05-13 NOTE — Patient Outreach (Signed)
[facility]) Care Management  05/13/2019  NAMIRA ROSEKRANS 10-Feb-1941 [id number]   Telephone Screen  Referral Date: 05/08/19 Referral Source: [place] Referral Reason: medication cost help  Eliquis patient assistance program Insurance: [employer] attempt # 1 successful at the home number  Patient is able to verify HIPAA Reviewed and addressed referral to [facility] with patient She gave [facility] RN CM ad [facility] pharmacy permission to call and speak with her daughter [name] as [name] picks up and puts medicine in containers [facility] RN CM left a message for her daughter [name] with attempt to assess more information    Social: Mrs Leckrone is a 78 year old female who reports she lives alone but with family who check on her frequently She states good support from her children and grandchildren. She reports being independent with ADLs but need assist with iADLs, She reports she can still clean her house. She reports assistance from her family for transportation prn    Conditions: HTN, atrial fibrillation, pulmonary HTN, severe mitral regurgitation, CAD, diastolic CHF, hearing loss, HOH, alzheimer's dementia without behavioral disturbance, low back pain, vitamin D deficiency, Insomnia, hx of autoimmune hemolytic anemia, osteoporosis of forearm without pathological fx allergic rhinitis., hx of falls at home, HLD, DM   DME: cbg monitor   Medications: Eliquis 2 more week left - samples offered by [facility] on 05/09/19 [name] picks up her medicine and pt reports [facility] states Eliquis was $100+  Eliquis was $40.00 but now it's 125.00 which she cannot afford   Advance Directives: She states [name] is her POA    Consent: [facility] RN CM reviewed [facility] services with patient. Patient gave verbal consent for services [facility] telephonic RN CM and [facility] pharmacy .   Plan: [facility] RN CM will refer Mrs Degraffenreid to [facility] for polypharmacy and medication  assistance with Eliquis   Pt encouraged to return a call to [place] CM prn  [facility] RN CM sent a successful outreach letter as discussed with [facility] brochure enclosed for review  Routed note to MDs  Joelene Millin L. [name], RN, BSN, [place] Coordinator Office number [phone] Mobile number [phone]  Main [facility] number [phone] Fax number [phone]

## 2019-05-15 ENCOUNTER — Telehealth: Payer: Self-pay | Admitting: Pharmacist

## 2019-05-15 NOTE — Patient Outreach (Addendum)
[facility]) Care Management  05/15/2019  Stephanie Horne [birth year] [id number]  Called patient regarding medication assistance with Eliquis. Unfortunately, she did not answer her home phone and a voicemail did not pick up. The number listed as her mobile number was called. A woman who identified herself as the patient's daughter Stephanie Horne), answered the phone.  Stephanie Horne is on the patient's HIPAA information.  The purpose of my call was explained to Stephanie Horne. She said she was in the car at the time of my call and could not discuss her mother's medications but said she would call me back later this afternoon, once she was/is settled.  Plan: Await a call back. Call patient/Daughter back in 1 week if no answer.  Stephanie Horne, PharmD, [place] Clinical Pharmacist [phone]

## 2019-05-16 ENCOUNTER — Other Ambulatory Visit: Payer: Self-pay

## 2019-05-16 ENCOUNTER — Ambulatory Visit (INDEPENDENT_AMBULATORY_CARE_PROVIDER_SITE_OTHER): Payer: Medicare Other | Admitting: Internal Medicine

## 2019-05-16 VITALS — BP 114/65 | HR 66 | Temp 97.5°F | Ht 70.0 in | Wt 141.3 lb

## 2019-05-16 DIAGNOSIS — J3489 Other specified disorders of nose and nasal sinuses: Secondary | ICD-10-CM

## 2019-05-16 DIAGNOSIS — Z79899 Other long term (current) drug therapy: Secondary | ICD-10-CM

## 2019-05-16 DIAGNOSIS — F0280 Dementia in other diseases classified elsewhere without behavioral disturbance: Secondary | ICD-10-CM

## 2019-05-16 DIAGNOSIS — R634 Abnormal weight loss: Secondary | ICD-10-CM | POA: Diagnosis not present

## 2019-05-16 DIAGNOSIS — J302 Other seasonal allergic rhinitis: Secondary | ICD-10-CM

## 2019-05-16 DIAGNOSIS — Z6820 Body mass index (BMI) 20.0-20.9, adult: Secondary | ICD-10-CM

## 2019-05-16 DIAGNOSIS — G309 Alzheimer's disease, unspecified: Secondary | ICD-10-CM | POA: Diagnosis not present

## 2019-05-16 DIAGNOSIS — Z87891 Personal history of nicotine dependence: Secondary | ICD-10-CM

## 2019-05-16 MED ORDER — SALINE SPRAY 0.65 % NA SOLN: 1.0000 | NASAL | 3 refills | Status: DC | PRN

## 2019-05-16 NOTE — Assessment & Plan Note (Signed)
Weight loss: Per chart review, Ms. Darral Dash has lost almost 40 pounds since March 2019.  On further questioning, she states that "I do not want to get fat.  "Due to this notion, she states that she has been eating smaller portions of her food.  Her daughter recently started adding nutrition supplements to her meals.  She denies bright red blood per rectum, hematochezia, cough, shortness of breath.  She has a remote history of cigarette use however has not had any tobacco products since 1980.  She does tell me there are times where she would experience diarrhea with certain foods such as gravy or salad.  She denies heat or cold intolerance, palpitation, diaphoresis.  Hyperthyroidism is very less likely in her case.  Consideration can be given for carcinoid syndrome.  Plan: - Advised daughter and patient to stay consistent with nutrition supplements

## 2019-05-16 NOTE — Patient Instructions (Signed)
Stephanie Horne,   It was a pleasure taking care of you in the clinic today.  For the runny nose you have been having, I prescribed for the mist nasal spray for you and hopefully this helps you.  I would continue taking the memory medication as indicated by Dr. Heber [place].  Further weight loss, as we discussed.  You can add either an Ensure or boost supplement to your food 3-4 times a day.  I will also discuss with Dr. Heber [place] to see if we should order additional laboratory tests.  Take Care! Dr. Eileen Stanford  Please call the internal medicine center clinic if you have any questions or concerns, we may be able to help and keep you from a long and expensive emergency room wait. Our clinic and after hours phone number is [phone], the best time to call is Monday through Friday 9 am to 4 pm but there is always someone available 24/7 if you have an emergency. If you need medication refills please notify your pharmacy one week in advance and they will send [place] a request.

## 2019-05-16 NOTE — Progress Notes (Signed)
   CC: Weight loss and rhinorrhea  HPI:  [name] is a 78 y.o. with medical history listed below presenting for evaluation of rhinorrhea and weight loss  Please see problem based charting for further details.  Past Medical History:  Diagnosis Date  . AIHA (autoimmune hemolytic anemia) ([place])    12/08 onset  . Atrial fibrillation ([place])    not on coumidin because AIHA and major bleeding complications  . Axillary lymphadenopathy    left negative MMG 1/08  . Back pain, chronic   . Complicated grief 8/92/1194   Death of husband in January 01, 2011   . Diabetes mellitus   . Hyperlipidemia   . Hypertension   . Lupus ([place])    Treated with prednisone indefinitely   . Mitral regurgitation   . Mitral regurgitation 10/24/2006   2d Echo from 01-Jan-2008 - EF 60%, Moderated MR    . Seasonal allergies    Review of Systems: As per HPI  Physical Exam:  Vitals:   05/16/19 1345  BP: 114/65  Pulse: 66  Temp: (!) 97.5 F (36.4 C)  TempSrc: Oral  SpO2: 97%  Weight: 141 lb 4.8 oz (64.1 kg)  Height: 5\' 10"  (1.778 m)   Physical Exam  Constitutional: She is well-developed, well-nourished, and in no distress.  HENT:  Head: Normocephalic and atraumatic.  Right Ear: External ear normal.  Left Ear: External ear normal.  Nose: Nose normal.  Mouth/Throat: Oropharynx is clear and moist. No oropharyngeal exudate.  Eyes: Conjunctivae are normal.  Neck: Neck supple.  Cardiovascular: Normal rate, regular rhythm and normal heart sounds.  No murmur heard. Pulmonary/Chest: Effort normal and breath sounds normal. She has no wheezes.  Abdominal: Soft. Bowel sounds are normal. There is no abdominal tenderness.    Assessment & Plan:   See Encounters Tab for problem based charting.  Patient discussed with Dr. Rebeca Alert

## 2019-05-16 NOTE — Assessment & Plan Note (Signed)
Rhinorrhea: Ms. Hollinsworth presents today with her [name]) for evaluation of chronic rhinorrhea.  She has a history of Alzheimer's disease and was recently started on Aricept 5 mg daily in March 2020.  Since then, she states she has been experiencing clear rhinorrhea without any sinus issues or systemic findings.  Her symptoms have not improved with Flonase or over-the-counter antihistamines.  Her PCP (Dr. Heber Eskridge) had wanted to switch her from Aricept to receiving me or Namenda as the thought was that her acetylcholinesterase inhibitor was contributing to the rhinorrhea.  The daughter today states that she would like to continue her Aricept without making any changes.  Etiology of her rhinorrhea is still unclear.  On physical exam today, nasal turbinates were not boggy, inflamed or erythematous.  Otoscope expection of her ears were unremarkable.  Her sinuses were nontender to palpation.  Plan: - Trial of normal saline nasal spray

## 2019-05-20 NOTE — Progress Notes (Signed)
Internal Medicine Clinic Attending  Case discussed with Dr. Agyei at the time of the visit.  We reviewed the resident's history and exam and pertinent patient test results.  I agree with the assessment, diagnosis, and plan of care documented in the resident's note.  Alexander Raines, M.D., Ph.D.  

## 2019-05-21 ENCOUNTER — Other Ambulatory Visit: Payer: Self-pay | Admitting: Pharmacist

## 2019-05-21 NOTE — Patient Outreach (Signed)
[place] [facility]) Care Management  05/21/2019  LINZI OHLINGER 08/16/1941 [id number]   Patient was called regarding medication assistance with Eliquis. HIPAA identifiers were obtained. Patient answered the phone but said her daughter, [name] would be the person to talk to about her medications and their cost. [name] was called but she did not answer. HIPAA compliant message was left on her voicemail. Today's call was the 2nd unsuccessful phone call.  Plan: Send patient an unsuccessful outreach letter Call patient/daughter back in 10-14 business days. Consider closing case if unable to reach [name].   [name], PharmD, [place] Clinical Pharmacist [phone]

## 2019-05-27 ENCOUNTER — Telehealth: Payer: Self-pay

## 2019-05-27 NOTE — Telephone Encounter (Signed)
Pt's daughter states her mother is having diarrhea, not eating only drinking ensure. Please call back.

## 2019-05-27 NOTE — Telephone Encounter (Signed)
Called [facility] and spoke to nutritionist about pt's problem, she suggested based on what pt's daughter ask for that [facility] can order resource boost breeze only by the case and insurance will not pay for it. Case has 27 cans. Info given to pt's daughter and ph# to [facility] FYI to dr Heber [place]

## 2019-06-05 ENCOUNTER — Other Ambulatory Visit: Payer: Self-pay

## 2019-06-05 MED ORDER — TRAZODONE HCL 50 MG PO TABS: 50.0000 mg | ORAL_TABLET | Freq: Every evening | ORAL | 1 refills | Status: DC | PRN

## 2019-06-05 NOTE — Telephone Encounter (Signed)
traZODone (DESYREL) 50 MG tablet, refill request @ [facility].

## 2019-06-14 ENCOUNTER — Other Ambulatory Visit: Payer: Self-pay | Admitting: Pharmacist

## 2019-06-15 NOTE — Patient Outreach (Addendum)
[facility]) Care Management  [place]   06/15/2019  ZAILYN THOENNES 31-May-1941 [id number]  Reason for referral: Medication Assistance  Referral source: [facility] RN Current insurance: [facility]  PMHx includes but not limited to:   Allergic rhinitis, Alzheimer's, A fib, type 2 diabetes, CAD, hypertension, hyperlipidemia, osteoporosis, pulmonary hypertension, vitamin D deficiency, and weight loss.  Outreach:  Successful telephone call with patient's [name].  HIPAA identifiers verified.   Subjective:  Patient is an 78 year old female with the above mentioned medical conditions. Due to her Alzheimer's, her [name]) manages her medications. [name] reported the copay for Eliquis had gone up recently to >$75.  Does the patient ever forget to take medication?  yes Does the patient have problems obtaining medications due to transportation?   no Does the patient have problems obtaining medications due to cost?  yes  Does the patient feel that medications prescribed are effective?  yes Does the patient ever experience any side effects to the medications prescribed?  Yes-diarrhea with Metformin  Does the patient measure his/her own blood glucose at home?  Yes Does the patient measure his/her own blood pressure at home? No   Objective: The ASCVD Risk score Mikey Bussing DC Jr., et al., 2013) failed to calculate for the following reasons:   The 2013 ASCVD risk score is only valid for ages 78 to 2  Lab Results  Component Value Date   CREATININE 0.80 11/15/2018   CREATININE 0.81 05/10/2018   CREATININE 0.84 09/28/2017    Lab Results  Component Value Date   HGBA1C 6.2 (A) 11/15/2018    Lipid Panel     Component Value Date/Time   CHOL 128 11/15/2018 1144   TRIG 72 11/15/2018 1144   HDL 61 11/15/2018 1144   CHOLHDL 2.1 11/15/2018 1144   CHOLHDL 3.1 06/04/2014 1418   VLDL 29 06/04/2014 1418   LDLCALC 53 11/15/2018 1144    BP Readings  from Last 3 Encounters:  05/16/19 114/65  11/15/18 (!) 116/59  08/09/18 122/62    Allergies  Allergen Reactions  . Metformin And Related     Diarrhea that was felt to be 2/2 metformin so trial of it was attempted.  . Lisinopril     cough  . Pollen Extract Other (See Comments)    Congestion, runny nose, phlegm    Medications Reviewed Today    Reviewed by [name], [facility] (Pharmacist) on 06/14/19 at [phone]  Med List Status: <None>  Medication Order Taking? Sig Documenting Provider Last Dose Status Informant  apixaban (ELIQUIS) 5 MG TABS tablet [id number] Yes Take 1 tablet (5 mg total) by mouth 2 (two) times daily. Lucious Groves, DO Taking Active         Discontinued 02/13/13 1613 (Reorder)   Calcium Carbonate-Vitamin D (CALTRATE 600+D) 600-400 MG-UNIT per tablet [id number] Yes Take 1 tablet by mouth 3 (three) times daily with meals.  Patient taking differently: Take 1 tablet by mouth daily.    Othella Boyer, MD Taking Active Self  donepezil (ARICEPT) 5 MG tablet [id number] Yes Take 5 mg by mouth at bedtime. [provider] Taking Active   furosemide (LASIX) 40 MG tablet [id number] Yes TAKE 1 TABLET (40 MG TOTAL) BY MOUTH DAILY. Lucious Groves, DO Taking Active   glucose blood test strip [id number] Yes USE TO chech blood sugar UP TO TWICE DAILY Lucious Groves, DO Taking Active   ibuprofen (ADVIL) 200 MG tablet [id number] Yes Take  200 mg by mouth every 6 (six) hours as needed. [provider] Taking Active   levocetirizine (XYZAL) 5 MG tablet [id number] Yes TAKE ONE-HALF TABLET BY MOUTH EVERY EVENING Lucious Groves, DO Taking Active   Loperamide HCl (IMODIUM A-D PO) [id number] Yes Take 1 tablet by mouth daily as needed. [provider] Taking Active   metFORMIN (GLUCOPHAGE XR) 500 MG 24 hr tablet [id number]  Take 1 tablet (500 mg total) by mouth 2 (two) times daily. Lucious Groves, DO  Expired 02/05/19 2359   metoprolol tartrate (LOPRESSOR) 50 MG tablet [id number]  Yes TAKE 1 TABLET (50 MG TOTAL) BY MOUTH 2 (TWO) TIMES DAILY. Lucious Groves, DO Taking Active   nitroGLYCERIN (NITROSTAT) 0.4 MG SL tablet [id number] Yes Place 1 tablet (0.4 mg total) under the tongue every 5 (five) minutes as needed for chest pain. Holley Raring, MD Taking Active   omeprazole (PRILOSEC) 20 MG capsule [id number] Yes Take 1 capsule (20 mg total) by mouth daily. Lucious Groves, DO Taking Active   [facility] DELICA LANCETS 53G [place] [id number] Yes USE TO check UP TO TWICE DAILY E119 Lucious Groves, DO Taking Active   Polyethyl Glycol-Propyl Glycol (SYSTANE OP) [id number] Yes Place 1 drop into both eyes daily. [provider] Taking Active Self  potassium chloride SA (K-DUR,KLOR-CON) 20 MEQ tablet [id number] No Take 1 tablet (20 mEq total) by mouth daily.  Patient not taking: Reported on 06/14/2019   Lucious Groves, DO Not Taking Active   pravastatin (PRAVACHOL) 40 MG tablet [id number] Yes Take 1 tablet (40 mg total) by mouth daily. Lucious Groves, DO Taking Active   predniSONE (DELTASONE) 5 MG tablet [id number] Yes Take 1.5 tablets (7.5 mg total) by mouth daily. Lucious Groves, DO Taking Active   sodium chloride (OCEAN) 0.65 % SOLN nasal spray [id number] Yes Place 1 spray into both nostrils as needed for congestion. Jean Rosenthal, MD Taking Active   traZODone (DESYREL) 50 MG tablet [id number] Yes Take 1 tablet (50 mg total) by mouth at bedtime as needed for sleep. Lucious Groves, DO Taking Active           Assessment: Drugs sorted by system:  Neurologic/Psychologic: Donepezil,  Trazodone  Cardiovascular:  Eliquis, Furosemide (only takes PRN), Metoprolol tartrate, Nitroglycerin, Pravastatin,   Pulmonary/Allergy: Levocetirizine,   Gastrointestinal: Loperamide, Omeprazole,   Pain: Ibuprofen,   Vitamins/Minerals/Supplements: Calcium Carbonate-Vitamin D, Potassium Chloride (patient not taking due to size of pill),   Miscellaneous: Systane Eye Drops,   Medication  Review Findings:  . HgA1c - 6.2% 11/15/2018 (on statin) o HgA1c may need to be rechecked. Patient is 78 years old and her goal HgA1c may be around 8% given her age and comorbidities. In addition, the patient's daughter said her mother had been purposefully not eating in an attempt to lose weight. [name] reported her mother has lost >15 pounds over the last few months. o Her diabetes regimen may need to be modified to decrease the risk of hypoglycemia  Patient's daughter also reported her mother has swallowing issues with large tablets like her potassium and calcium.  Calcium is available in chewable tablets as well as gummies.    Patient's last K was 4.3 11/15/2018  Medication Adherence Findings: Adherence Review  '[]'  Excellent (no doses missed/week)     '[x]'  Good (no more than 1 dose missed/week)     '[]'  Partial (2-3 doses missed/week) '[]'  Poor (>3 doses missed/week)  Patient with fair understanding of regimen and fair  understanding of indications.    Medication Assistance Findings:  Medication assistance needs identified: Eliquis  Patient appears to meet the financial requirements to qualify for [place] Patient Assistance Program to obtain Eliquis.  However, it is unclear if the patient has met the 3% of household income out-of-pocket requirement.  Patient's daughter said she would look into getting print outs from her pharmacy. (This may be difficult as the patient uses four different pharmacies.)  [name] was educated about the EOB (estimation of benefits) that the insurance should be sending to her mother every month.  Extra Help:  Not eligible for Extra Help Low Income Subsidy based on reported income and assets  Patient Assistance Programs: Eliquis made by [place] requirement met: Yes o Out-of-pocket prescription expenditure met:   Unknown - Reviewed program requirements with patient.     Additional medication assistance options reviewed with patient  as warranted:  No other options identified  Plan: . I will route patient assistance letter to [place] technician who will coordinate patient assistance program application process for medications listed above.  [facility] pharmacy technician will assist with obtaining all required documents from both patient and provider(s) and submit application(s) once completed.  . Will route note to PCP.  Marland Kitchen Will follow-up in 4 weeks.  [name], PharmD, [place] Clinical Pharmacist [phone]

## 2019-06-17 ENCOUNTER — Telehealth: Payer: Self-pay | Admitting: Internal Medicine

## 2019-06-17 MED ORDER — MEMANTINE HCL 5 MG PO TABS: 5.0000 mg | ORAL_TABLET | Freq: Every day | ORAL | 5 refills | Status: DC

## 2019-06-17 NOTE — Telephone Encounter (Signed)
Pt's daughter states diarrhea continues several times daily, please advise

## 2019-06-17 NOTE — Telephone Encounter (Signed)
Called, [name] who is with patient. She is still having diarrhea without nausea, continues to have rhinorrhea.  And has had weight loss.    Thus as I previously discussed I am concerned this could be side effect of the aricept.  Will have her discontinue aricpet and start 5mg  daily of namenda.  May cancel [facility] appointment tomorrow. Daughter will call if not improving, otherwise schedule follow up visit with me in about a month.

## 2019-06-17 NOTE — Telephone Encounter (Signed)
Pt's daughter calling back about pt's Diarrhea.

## 2019-06-18 ENCOUNTER — Ambulatory Visit: Payer: Medicare Other

## 2019-06-18 ENCOUNTER — Other Ambulatory Visit: Payer: Self-pay | Admitting: Pharmacy Technician

## 2019-06-18 NOTE — Patient Outreach (Signed)
[place] [facility]) Care Management  06/18/2019  Stephanie Horne 21-Nov-1940 [id number]                                        Medication Assistance Referral  Referral From: [place]  Medication/Company: Eliquis / BMS Patient application portion:  Mailed Provider application portion: Faxed  to Dr. Joni Reining Provider address/fax verified via: Office website     Follow up:  Will follow up with patient in 5-10 business days to confirm application(s) have been received.  [name] P. Marzella Miracle, [place] Management [phone]

## 2019-06-21 ENCOUNTER — Telehealth: Payer: Self-pay

## 2019-06-21 NOTE — Telephone Encounter (Signed)
I have samples prepared for patient in the med room, but not sure when patient can pick up. I spoke to patient's daughter and she states she will try to pick up by Wednesday.  Thank you.

## 2019-06-21 NOTE — Telephone Encounter (Signed)
apixaban (ELIQUIS) 5 MG TABS tablet, Requesting a sample. Please call pt's daughter back.

## 2019-06-21 NOTE — Telephone Encounter (Signed)
Spoke w/ pharm student/ dr Maudie Mercury, they will check this

## 2019-06-24 ENCOUNTER — Other Ambulatory Visit: Payer: Self-pay | Admitting: Internal Medicine

## 2019-06-25 NOTE — Telephone Encounter (Signed)
Next appt w/pcp 10/22

## 2019-06-26 ENCOUNTER — Other Ambulatory Visit: Payer: Self-pay | Admitting: Pharmacy Technician

## 2019-06-26 NOTE — Patient Outreach (Signed)
[place] [facility]) Care Management  06/26/2019  SEBA TSCHOEPE 1941-02-27 [id number]  Unsuccessful outreach call placed to patient in regards to BMS application for Eliquis.  Unfortunately patient did not answer the phone, HIPAA compliant voicemail left.  Will followup with patient in 5-7 business days if call is not returned.  Brynja Marker P. Abrahm Mancia, [place] Management [phone]

## 2019-07-03 ENCOUNTER — Other Ambulatory Visit: Payer: Self-pay | Admitting: Pharmacy Technician

## 2019-07-03 NOTE — Patient Outreach (Signed)
[facility]) Care Management  07/03/2019  Stephanie Horne 1940-11-22 [id number]  Successful outreach call placed to patient in regards to BMS application for Eliquis.   Spoke to patient, HIPAA identifiers verified.  Patient informed she has not received the application in the mail at this time. Informed patient I would followup with her next week and patient was agreeable to this plan.  Will outreach patient with 2nd phone call in 5-10 business days to inquire if application was received.  Dennice Tindol P. Arrion Broaddus, [place] Management [phone]

## 2019-07-12 ENCOUNTER — Other Ambulatory Visit: Payer: Self-pay | Admitting: Pharmacy Technician

## 2019-07-12 NOTE — Patient Outreach (Signed)
[facility]) Care Management  07/12/2019  Stephanie Horne 1941/01/20 [id number]   Successful outreach call placed to patient's daughter. Per designated party release,information may released to her daughter Stephanie Horne.  Spoke to patient's daughter Stephanie Horne, HIPAA identifiers verified x 2.  Inquired if patient had received the application.Stephanie Horne informed that she has been waiting for it but it has not arrived yet. Stephanie Horne informed that if the application was being mailed to her mom then due to her dementia, she either shredded the information or threw it away. Stephanie Horne informed it would be better to mail the application to her. Per [facility] Katina Boyd's note, Stephanie Horne helps her mother (the patient) manage her medication. Stephanie Horne provided me the address to mail the application. The address is [name], [address] [place], [place] [address]. Informed Stephanie Horne application would be mailed out on Tuesday 07/16/2019 and that I would followup with her 5-7 business days after that day to make sure she received it. Patient's daughter was agreeable to this plan.  Katurah Karapetian P. Barbette Mcglaun, [place] Management [phone]

## 2019-07-16 ENCOUNTER — Other Ambulatory Visit: Payer: Self-pay | Admitting: *Deleted

## 2019-07-16 MED ORDER — MEMANTINE HCL 5 MG PO TABS: 5.0000 mg | ORAL_TABLET | Freq: Every day | ORAL | 3 refills | Status: DC

## 2019-07-17 MED ORDER — METFORMIN HCL ER 500 MG PO TB24: 500.0000 mg | ORAL_TABLET | Freq: Two times a day (BID) | ORAL | 3 refills | Status: DC

## 2019-07-24 ENCOUNTER — Other Ambulatory Visit: Payer: Self-pay | Admitting: Pharmacy Technician

## 2019-07-24 NOTE — Patient Outreach (Signed)
[place] [facility]) Care Management  07/24/2019  Stephanie Horne [birth year] [id number]  Successful outreach call placed to patient in regards to patient's BMS application for Eliquis.  Spoke to patient's daughter, [name]. HIPAA identifiers verified.  Patient's daughter informed she has not received the application that was mailed to her home. She informed her mail comes late in the day after 6pm. Informed patient's daughter that the application was mailed on the 13th but that the mail has to go to the hospital for postage before being sent out.  Informed patient I would followup with her next week and if application has not been received, then other arrangements can be discussed.  Will followup with patient in 3-7 business days.  Donaven Criswell P. Marirose Deveney, [place] Management [phone]

## 2019-07-25 ENCOUNTER — Encounter: Payer: Self-pay | Admitting: Internal Medicine

## 2019-07-25 ENCOUNTER — Ambulatory Visit (INDEPENDENT_AMBULATORY_CARE_PROVIDER_SITE_OTHER): Payer: Medicare Other | Admitting: Internal Medicine

## 2019-07-25 ENCOUNTER — Other Ambulatory Visit: Payer: Self-pay

## 2019-07-25 VITALS — BP 114/65 | HR 90 | Temp 98.4°F | Ht 70.0 in | Wt 135.0 lb

## 2019-07-25 DIAGNOSIS — R2689 Other abnormalities of gait and mobility: Secondary | ICD-10-CM | POA: Diagnosis not present

## 2019-07-25 DIAGNOSIS — G301 Alzheimer's disease with late onset: Secondary | ICD-10-CM

## 2019-07-25 DIAGNOSIS — I4821 Permanent atrial fibrillation: Secondary | ICD-10-CM

## 2019-07-25 DIAGNOSIS — F0280 Dementia in other diseases classified elsewhere without behavioral disturbance: Secondary | ICD-10-CM

## 2019-07-25 DIAGNOSIS — R197 Diarrhea, unspecified: Secondary | ICD-10-CM

## 2019-07-25 DIAGNOSIS — Z79899 Other long term (current) drug therapy: Secondary | ICD-10-CM

## 2019-07-25 DIAGNOSIS — E119 Type 2 diabetes mellitus without complications: Secondary | ICD-10-CM

## 2019-07-25 DIAGNOSIS — G309 Alzheimer's disease, unspecified: Secondary | ICD-10-CM

## 2019-07-25 DIAGNOSIS — Z7984 Long term (current) use of oral hypoglycemic drugs: Secondary | ICD-10-CM

## 2019-07-25 DIAGNOSIS — I340 Nonrheumatic mitral (valve) insufficiency: Secondary | ICD-10-CM

## 2019-07-25 DIAGNOSIS — R2681 Unsteadiness on feet: Secondary | ICD-10-CM

## 2019-07-25 DIAGNOSIS — Z23 Encounter for immunization: Secondary | ICD-10-CM

## 2019-07-25 DIAGNOSIS — Z7901 Long term (current) use of anticoagulants: Secondary | ICD-10-CM

## 2019-07-25 LAB — POCT GLYCOSYLATED HEMOGLOBIN (HGB A1C): Hemoglobin A1C: 6.1 % — AB (ref 4.0–5.6)

## 2019-07-25 LAB — GLUCOSE, CAPILLARY: Glucose-Capillary: 135 mg/dL — ABNORMAL HIGH (ref 70–99)

## 2019-07-25 MED ORDER — SITAGLIPTIN PHOSPHATE 100 MG PO TABS: 100.0000 mg | ORAL_TABLET | Freq: Every day | ORAL | 11 refills | Status: DC

## 2019-07-25 NOTE — Progress Notes (Signed)
  Subjective:  HPI: Stephanie Horne is a 78 y.o. female who presents for f/u DM, Alzheimers, diarrhea  Please see Assessment and Plan below for the status of her chronic medical problems.  Review of Systems: Review of Systems  Constitutional: Negative for fever.  HENT: Negative for sore throat.   Eyes: Negative for blurred vision.  Respiratory: Negative for cough and shortness of breath.   Cardiovascular: Negative for chest pain.  Gastrointestinal: Positive for diarrhea. Negative for heartburn.  Genitourinary: Negative for dysuria.  Neurological: Positive for weakness.  Psychiatric/Behavioral: Negative for depression.    Objective:  Physical Exam: Vitals:   07/25/19 1034  BP: 114/65  Pulse: 90  Temp: 98.4 F (36.9 C)  TempSrc: Oral  SpO2: 100%  Weight: 135 lb (61.2 kg)  Height: 5\' 10"  (1.778 m)   Body mass index is 19.37 kg/m. Physical Exam Constitutional:      Appearance: Normal appearance.     Comments: In wheelchair  HENT:     Head: Normocephalic and atraumatic.     Nose: Nose normal.     Mouth/Throat:     Mouth: Mucous membranes are dry.  Cardiovascular:     Rate and Rhythm: Normal rate and regular rhythm.  Pulmonary:     Effort: Pulmonary effort is normal.  Abdominal:     General: Abdomen is flat.     Palpations: Abdomen is soft.  Musculoskeletal:        General: No swelling.     Right lower leg: No edema.     Left lower leg: No edema.  Skin:    General: Skin is warm and dry.  Neurological:     Mental Status: She is alert. Mental status is at baseline.     Comments: 4/5 strength grossly UE and LE bilaterally    Assessment & Plan:  See Encounters Tab for problem based charting.  Medications Ordered Meds ordered this encounter  Medications  . sitaGLIPtin (JANUVIA) 100 MG tablet    Sig: Take 1 tablet (100 mg total) by mouth daily.    Dispense:  30 tablet    Refill:  11   Other Orders Orders Placed This Encounter  Procedures  . For home  use only DME lightweight manual wheelchair with seat cushion    Patient suffers from gait instability, alzheimer's dementia, pulmonary hypertension which impairs their ability to perform daily activities like toileting in the home.  A walker will not resolve  issue with performing activities of daily living. A wheelchair will allow patient to safely perform daily activities. Patient is not able to propel themselves in the home using a standard weight wheelchair due to endurance. Patient can self propel in the lightweight wheelchair. Length of need Lifetime. Accessories: elevating leg rests (ELRs), wheel locks, extensions and anti-tippers.  . Flu Vaccine QUAD 36+ mos IM  . Glucose, capillary  . POCT HgB A1C (CPT [phone])   Follow Up: Return in about 3 months (around 10/25/2019).

## 2019-07-25 NOTE — Progress Notes (Signed)
poc

## 2019-07-29 ENCOUNTER — Ambulatory Visit: Payer: Self-pay | Admitting: Pharmacist

## 2019-07-30 ENCOUNTER — Other Ambulatory Visit: Payer: Self-pay | Admitting: Pharmacy Technician

## 2019-07-30 NOTE — Assessment & Plan Note (Signed)
Overdue for follow-up with Dr. Burt Knack I reminded her of this and she plans to schedule an appointment.

## 2019-07-30 NOTE — Assessment & Plan Note (Signed)
HPI: Continues to struggle with affordability of Eliquis however we have applied for patient assistance with the help of T [facility] this is in progress.  Assessment permanent A. Fib  Plan Continue Eliquis

## 2019-07-30 NOTE — Patient Outreach (Signed)
[facility] [facility]) Care Management  07/30/2019  Stephanie Horne 08/03/1941 [id number]   Successful call placed to patient regarding patient assistance application(s) for Eliquis with BMS ,Spoke to patient's [name] HIPAA identifiers verified. Patient's daughter informed they received the application and she is in the process of gathering the supporting documents. [name] informed she had no questions about the application and confirmed having name and number.  Follow up:  Will route note to [place] [place] for case closure if document(s) have not been received in the next 15 business days.  Adylene Dlugosz P. Zyia Kaneko, [place] Management [phone]  *

## 2019-07-30 NOTE — Assessment & Plan Note (Signed)
HPI: She has not had any noticable further progression of her memory loss.  Since our last visit I spoke with her daughter she continued to have continuous rhinitis as well as some diarrhea thus we discontinued Aricept and started her on Namenda.  She is tolerating the Namenda well her rhinitis has completely resolved her diarrhea has not.  Assessment Alzheimer's dementia without behavioral disturbance  Plan Continue Namenda we certainly could restart Aricept if needed but would expect the side effect of rhinitis to return.

## 2019-07-30 NOTE — Assessment & Plan Note (Addendum)
HPI: She is continued to be adherent to Metformin however has also continued to have diarrhea this was not improved with change from Aricept and Namenda.  No hyper or hypoglycemia no polyuria or polydipsia.  Assessment controlled type 2 diabetes  Plan We will discontinue Metformin XR due to possible side effect of diarrhea.  We will instead start Januvia 100 mg daily, I also discussed SGLT2 inhibitors and GLP-1 agonist in their cardiovascular benefits however she is concerned about possible side effects of these medications.

## 2019-07-30 NOTE — Assessment & Plan Note (Addendum)
HPI: Patient has continued to struggle with gait instability and generalized weakness.  She is attempted to use a cane and walker but these were unable to stabilize her allow her to get safely to the bathroom for toileting.  She has some weakness in her upper extremities and I do not think that she can propel a standard wheelchair.  Assessment gait instability  Plan Obtain lightweight wheelchair I think with this she should be able to safely propel herself around the house in order to complete her ADLs.

## 2019-08-02 DIAGNOSIS — G309 Alzheimer's disease, unspecified: Secondary | ICD-10-CM | POA: Diagnosis not present

## 2019-08-07 ENCOUNTER — Telehealth: Payer: Self-pay

## 2019-08-07 NOTE — Telephone Encounter (Signed)
Pt's daughter states   sitaGLIPtin (JANUVIA) 100 MG tablet  Cost $300.00, requesting to speak with a nurse. Please call back.

## 2019-08-08 ENCOUNTER — Other Ambulatory Visit: Payer: Self-pay | Admitting: *Deleted

## 2019-08-08 DIAGNOSIS — I38 Endocarditis, valve unspecified: Secondary | ICD-10-CM

## 2019-08-09 MED ORDER — POTASSIUM CHLORIDE CRYS ER 20 MEQ PO TBCR: 20.0000 meq | EXTENDED_RELEASE_TABLET | Freq: Every day | ORAL | 1 refills | Status: DC

## 2019-08-19 ENCOUNTER — Other Ambulatory Visit: Payer: Self-pay | Admitting: Pharmacy Technician

## 2019-08-19 NOTE — Patient Outreach (Signed)
[place] [facility]) Care Management  08/19/2019  ALYX KEPLEY 03-31-1941 [id number]    Incoming call received from patient's [name] in regards to BMS application for Eliquis.  HIPAA identifiers verified.  [name] informed she had all the information ready to send in for the patient assistance process except for the proof of income. She informed she could not locate her mother's social security statement and she informed the local office was not allowing visitors inside due to [place]. Informed patient's daughter  that she could submit the deposit section of her mother's monthly bank statement showing the deposit of the social security into the bank. Informed patient's daughter that she could black all other non pertinent information as long as they could see her mother's name, the institution name and see the deposit. Patient's daughter verbalized understanding. She informed she would place in the mail today.  Will route note to [place] if information is not received back in 7 business days since patient contacted me today.  Armour Villanueva P. Cathy Crounse, [place] Management [phone]

## 2019-08-22 ENCOUNTER — Other Ambulatory Visit: Payer: Self-pay | Admitting: Pharmacy Technician

## 2019-08-22 NOTE — Patient Outreach (Signed)
[facility] [facility]) Care Management  08/22/2019  DAZJAH KISSAM 01/02/1941 [id number]   Received both patient and provider portion(s) of patient assistance application(s) for Eliquis. Faxed completed application and required documents into BMS.  Will follow up with company(ies) in 5-10 business days to check status of application(s).  Ibtisam Benge P. Takita Riecke, [place] Management [phone]

## 2019-08-27 ENCOUNTER — Other Ambulatory Visit: Payer: Self-pay | Admitting: Pharmacy Technician

## 2019-08-27 ENCOUNTER — Telehealth: Payer: Self-pay | Admitting: Internal Medicine

## 2019-08-27 NOTE — Patient Outreach (Signed)
[place] [facility]) Care Management  08/27/2019  Stephanie Horne July 22, 1941 [id number]   Care coordination call placed to BMS patient assistance foundation in regards to Eliquis application.  Spoke to [place] who informed application has been received but has  not been processed. She informed it can take 5-7 business days for processing but with the upcoming holiday and BMS being closed, it may be the week of the [id number] before the application is processed.  Will followup with BMS in 5-10 business days to inquire on status of application.  [name] P. Treyshon Buchanon, [place] Management [phone]

## 2019-08-27 NOTE — Telephone Encounter (Signed)
Spoke w/ Gerri Spore at [place] is on order, will give pt 1 month bottle- sample and hopefully be processed and delivered from company by then.

## 2019-08-27 NOTE — Patient Outreach (Signed)
[place] [facility]) Care Management  08/27/2019  Stephanie Horne [birth year] [id number]    ADDENDUM  Care coordination call placed to [facility] at [facility] medicine in regards to a voicemail she left for me.  Spoke to [place]. Bonnita Nasuti informed she was providing patient with a sample bottle of Eliquis until the patient assistance process is completed. Informed Bonnita Nasuti that BMS has received her application but has not processed it.  If patient is approved then the medication normally takes 2 weeks to reach the patient. Bonnita Nasuti informed the sample bottle she is providing should be enough until the medication is received.  Will followup with BMS as previously scheduled.  Devaunte Gasparini P. Nadezhda Pollitt, [place] Management [phone]

## 2019-08-27 NOTE — Telephone Encounter (Signed)
Pt daughter is needing a refill on pt heart medicine, she said that she has been getting samples until medicare is taking care of, [phone]

## 2019-09-02 ENCOUNTER — Other Ambulatory Visit: Payer: Self-pay | Admitting: Internal Medicine

## 2019-09-02 NOTE — Telephone Encounter (Signed)
Refill Request   traZODone (DESYREL) 50 MG tablet  [id number] [place], [place]

## 2019-09-03 ENCOUNTER — Encounter: Payer: Self-pay | Admitting: Pharmacist

## 2019-09-03 MED ORDER — TRAZODONE HCL 50 MG PO TABS: 50.0000 mg | ORAL_TABLET | Freq: Every evening | ORAL | 1 refills | Status: DC | PRN

## 2019-09-03 NOTE — Progress Notes (Signed)
Patient was approved for free Eliquis through manufacturer patient assistance program.

## 2019-09-06 ENCOUNTER — Other Ambulatory Visit: Payer: Self-pay | Admitting: Pharmacy Technician

## 2019-09-06 ENCOUNTER — Other Ambulatory Visit: Payer: Self-pay | Admitting: *Deleted

## 2019-09-06 NOTE — Patient Outreach (Signed)
[name] [facility]) Care Management  09/06/2019  Stephanie Horne Feb 23, 1941 [id number]   Follow up call to follow up on pharmacy interventions and medical needs  [name] is [facility] sn she reminds [name] at the beginning of this call  Patient is able to verify HIPAA (DOB  & Address) but she had difficulty with the year of her DOB She later self corrected her answer. She reported initially she was born in [birth year] but then corrected she was born in [birth year], was 20 and she moved to [place] in [place] and addressed the purpose of the follow up call with the patient- to check on her home care for her medical illnesses after [name] helped with Eliquis  Consent: THN RN CM reviewed [facility] services with patient. Patient gave verbal consent for services.  [name] confirms [place] spoke with her 09/06/19 and informed her they would call again on "Monday" She was referred to [facility] in August 2020 from [name], pharmacy for medication cost help with Eliquis patient assistance program  Diabetes- [name] reports she has been taking her cbg in the am after she eats She reports a cbg of about 130 most days She can not recall the cbg value for 09/06/19  [facility] RN CM discussed a fasting cbg with her and she reports she will start taking her cbg before she eats   [name] received a call and informed [facility] RN CM that she had to go but when asked she agreed for [facility] RN CM to return a call to her "next week"   Social: [name] is a 78 year old female who reports she lives alone but with family who check on her frequently She states good support from her children especially her daughter, [name] [facility]) and grandchildren. She reports being independent with ADLs but need assist with iADLs, She reports she can still clean her house. She reports assistance from her family for transportation prn  [name] picks up her medicine   Conditions: HTN, atrial fibrillation, pulmonary HTN, severe mitral  regurgitation, CAD, diastolic CHF, hearing loss, HOH, alzheimer's dementia without behavioral disturbance, low back pain, vitamin D deficiency, Insomnia, hx of autoimmune hemolytic anemia, osteoporosis of forearm without pathological fx allergic rhinitis., hx of falls at home, HLD, DM   DME: cbg monitor   Plans [facility] RN CM will attempt again to assess further and offer [facility] RN CM services to patient when return a call to her in the next 7-14 days   Ambre Kobayashi L. [name], RN, BSN, [place] Coordinator Office number [phone] Mobile number [phone]  Main THN number [phone] Fax number [phone]

## 2019-09-06 NOTE — Patient Outreach (Signed)
[place] [facility]) Care Management  09/06/2019  Stephanie Horne 10-27-1940 [id number]  ADDENDUM  Care coordination call placed to [name] in regards to patient's shipment of Eliquis with BMS.  Spoke to [name] who informed the medication would be delivered to the patient on Monday 09/09/2019 via [name]. Patient would receive 180 tablets for a 90 days supply.  Will outreach patient with this information.  [name] P. Misao Fackrell, [place] Management [phone]

## 2019-09-06 NOTE — Patient Outreach (Signed)
[place] [facility]) Care Management  09/06/2019  Stephanie Horne November 09, 1940 [id number]   ADDENDUM  Successful outreach call placed to patient in regards to BMS application for Eliquis.  Spoke to patient's daughter Stephanie Horne, HIPAA identifiers verified.  Informed Stephanie Horne that the Eliquis would be delivered on Monday. Stephanie Horne inquired that she received a notice that patient's enrollment ends on 10/03/2019. Discussed re enrollment process with patient's daughter and provided her with an OOP figure to reference. Informed her that once her mother has reached that point in [facility] prescription spend then she would outreach me or the pharamcist [facility] [place] and we would restart the process. Patient's daughter verbalized understanding and confirmed having our numbers.  Will followup with patient in 3-7 business days to confirmed medication was received.  [name] P. Skylen Spiering, [place] Management [phone]

## 2019-09-06 NOTE — Patient Outreach (Signed)
[place] [facility]) Care Management  09/06/2019  Stephanie Horne October 15, 1940 [id number]    Care coordination call placed to BMS in regards to patient's application for Eliquis.  Spoke to [place] who informed patient has been APPROVED 08/27/2019-10/03/2019. She informed the prescription is in the "requested state" at [facility]. She suggested that I call Stephanie Horne for shipping information.  Stephanie Horne, [place] Management [phone]

## 2019-09-09 ENCOUNTER — Ambulatory Visit: Payer: Self-pay | Admitting: Pharmacist

## 2019-09-11 ENCOUNTER — Other Ambulatory Visit: Payer: Self-pay | Admitting: *Deleted

## 2019-09-11 ENCOUNTER — Other Ambulatory Visit: Payer: Self-pay | Admitting: Pharmacy Technician

## 2019-09-11 ENCOUNTER — Other Ambulatory Visit: Payer: Self-pay

## 2019-09-11 ENCOUNTER — Telehealth: Payer: Self-pay | Admitting: *Deleted

## 2019-09-11 NOTE — Patient Outreach (Signed)
[place] [facility]) Care Management  09/11/2019  NICOLENA SCHURMAN [birth year] [id number]   Follow up call to follow up on pharmacy interventions and medical needs   She was referred to [facility] in August 2020 from [name], pharmacy for medication cost help with Eliquis patient assistance program  Mrs Lichtenwalner is [facility] as she reminds [facility] RN CM again today  Patient is able to verify HIPAA (DOB  & Address) Reviewed and addressed the purpose of the follow up call with the patient-  Follow up after [place] helped with Eliquis  Consent: [facility] RN CM reviewed [facility] services with patient. Patient gave verbal consent for services for [facility] telephonic RN CM She informed [facility] RN CM that RN CM could call her [name]  [facility] RN CM called and spoke with [name] [facility]) Who confirms HIPAA She agrees her mother is [facility] but has hearing aids and is selective in wearing them  [place] confirms completed assist from [place] staff  Present concerns [name] reports Mrs Camire is doing well. THN RN CM assessed for any further needs for education of home care, transportation, medications, etc. [name] reports she is the primary caregiver but has medical issues of her own (MS, upcoming procedure etc) and she reports it is difficult getting Mrs Sonnenfeld to and from her primary care MD office to be evaluated [name] states Mrs Raneri was seen a few weeks ago in the MD office, Epic indicates in October 2020 [name] is inquiring about assistance with having a "nurse to check on her. To check her blood pressure so I will not have to take her to doctor hoffman office." [facility] RN CM reviewed the differences in home health services, personal care services and visiting MD services. [name] reports she provides the personal care services and she would like for Dr Heber [place] to remain the primary care provider.  With initiation of visiting MD services, [name] is aware that Dr Heber [place] will no longer be the primary  MD.  With personal care services [name] is aware that if Mrs Rison does not have medicaid the cost would be out of pocket. [name] confirms there is no medicaid. [name] does agree to have [facility] send her a list of the personal care services ONLY.  [name] states "[facility] said a nurse could come in"  [name] was made aware that with home health the patient may be seen only for a skilled need and the staff visit only based upon the skilled need only. THN RN CM discussed there is no guarantee that Mrs Mcswain may qualify for home health services and [name] voiced understanding and agreed to have [facility] RN CM speak with Dr Heber [place] office. THN RN CM discussed with [name] that [facility] home visits postponed related to covid pandemic. She voiced understanding [name] mentions that Mrs Wendel fell x 2 this year (2020).   MD office contact [facility] RN CM called Dr Heber [place] office and spoke with Ilda Basset interest in having home health to assist Mrs Correnti for home evaluation. Shared with [name] that [facility] RN CM did review differences in home health, personal care services and visiting MDs plus shared with [name] that there is no guarantee of available home health services  Mamie to send a message to Dr Heber [place] for consideration   Social:Mrs Dershem is a 78 year old female who reports she lives alone but with family who check on her frequently She states good support from her children especially her  daughter, [name] [facility]) and grandchildren. She reports being independent with ADLs but need assist with iADLs, She reports she can still clean her house. She reports assistance from her family for transportation prn [name] picks up her medicine   Conditions:HTN, atrial fibrillation, pulmonary HTN, severe mitral regurgitation, CAD, diastolic CHF, hearing loss, HOH, alzheimer's dementia without behavioral disturbance, low back pain, vitamin D deficiency, Insomnia, hx of autoimmune hemolytic anemia, osteoporosis of forearm without pathological  fx allergic rhinitis., hx of falls at home, HLD, DM   DME:cbg monitor  Plans [facility] RN CM will follow up in the next 7-14 days and plan for case closure if no further needs identified  Referred to [facility] for ONLY a list of [place] personal care services Routed note to MD  [place] Problem One     Most Recent Value  Care Plan Problem One  pharmacy needs  Role Documenting the Problem One  Care Management Telephonic Coordinator  Care Plan for Problem One  Active  [facility] Long Term Goal   Patient will receive [facility] pharmacy services  [facility] Long Term Goal Start Date  09/11/19  [facility] Long Term Goal Met Date  09/11/19  Interventions for Problem One Long Term Goal  [facility] pharmacy services completed  [facility] CM Short Term Goal #1   THn phramacy services to be offered as evidence by verbalization during contact with patient/daughter  [facility] CM Short Term Goal #1 Start Date  09/11/19  [facility] CM Short Term Goal #1 Met Date  09/11/19    [facility] CM Care Plan Problem Two     Most Recent Value  Care Plan Problem Two  home care services request  Role Documenting the Problem Two  Care Management Telephonic [place] for Problem Two  Active  Interventions for Problem Two Long Term Goal   [facility] RN CM reviewed the differences in home health services, personal care services and visiting MD services. Call to MD office Spoke with [facility] about home care request needs  [facility] Long Term Goal  Patient will be evaluated by primary care MD for necessity of home health services and orders provided prn as evidence by verbalization or orders noted in [place] Term Goal Start Date  09/11/19  [facility] CM Short Term Goal #1   over the next 10 days patient will have contact from MD office about home health services  [facility] CM Short Term Goal #1 Start Date  09/11/19  Interventions for Short Term Goal #2   [facility] RN CM reviewed the differences in home health services, personal care services and visiting MD services. Call to MD office  Spoke with [facility] about home care request needs       L. [name], RN, BSN, [place] Coordinator Office number [phone] Mobile number [phone]  Main THN number [phone] Fax number [phone]

## 2019-09-11 NOTE — Patient Outreach (Signed)
[place] [facility]) Care Management  09/11/2019  AYSHAH ALVA 1941-06-27 [id number]     Successful call placed to patient regarding patient assistance medication delivery of Eliquis with BMS, HIPAA identifiers verified.   Spoke to patient's [name]. HIPAA confirmed and verified. [name] informed her mother received a 90 days supply of Eliquis. Quickly reiterated process for 2021 as it was previously discussed in last phone call. Patient's daughter verbalized understanding. Confirmed patient's daughter had no other questions or concerns and that she had our name and number.  Follow up:  Will route note to [place] for case closure  Codey Burling P. Delana Manganello, [place] Management [phone]

## 2019-09-11 NOTE — Patient Outreach (Signed)
[place] [facility]) Care Management  09/11/2019  MAISEE KANAK 1940-12-18 [id number]   CSW received referral from [place], Joellyn Quails that patient/ daughter needs only a list of [place]. CSW called & spoke with patient's daughter, [name] to confirm that she was interested in a list of in-home assistance agencies. CSW will have list of In-Home Care and Respite Agencies along with a list of personal care services provided mailed to the patient's daughter. [name] states that at this time, no further CSW needs identified. CSW will close case.    Raynaldo Opitz, LCSW [facility]  Clinical Social Worker cell #: [phone]

## 2019-09-12 ENCOUNTER — Ambulatory Visit: Payer: Self-pay | Admitting: Pharmacist

## 2019-09-13 ENCOUNTER — Ambulatory Visit: Payer: Self-pay | Admitting: Pharmacist

## 2019-09-13 ENCOUNTER — Other Ambulatory Visit: Payer: Self-pay | Admitting: Pharmacist

## 2019-09-13 NOTE — Patient Outreach (Signed)
[place]ie Horne) Care Management  09/13/2019  Stephanie Horne Oct 25, 1940 [id number]   Patient's case is being closed because she received a 3 month supply of Eliquis from [place] Patient Assistance Program. It is the end of the year so the patient would need to reapply for 2021. To be approved, [place] requires patients to spend at least 3% of their household income on medication expenses.  Plan: Close patient's pharmacy case. Alert [facility] staff still involved in the patient's care.  [name], PharmD, [place] Clinical Pharmacist [phone]

## 2019-09-24 ENCOUNTER — Other Ambulatory Visit: Payer: Self-pay | Admitting: *Deleted

## 2019-09-24 NOTE — Patient Outreach (Signed)
[place] [facility]) Care Management  09/24/2019  Stephanie Horne 24-Jan-1941 [id number]   Follow up call to follow up on SW needs  She was referred to Stephanie Horne in August 2020 from Stephanie Horne, pharmacy for medication cost help with Eliquis patient assistance program  Consent: [facility] RN CM reviewed Stephanie Horne services with patient previously. Patient gave verbal consent for services for Stephanie Horne telephonic RN CM to speak with her daughter Stephanie Horne Pt with memory concerns  She informed [facility] RN CM that RN CM could call her daughter Stephanie Horne during all previous calls  Outreach to Stephanie Horne to f/u on [place] list of Stephanie Horne personal care service and to see if there were any other needs for Stephanie Horne She is not able to speak at this time and request a return call    Social:Stephanie Horne is a 78 year old female who reports she lives alone but with family who check on her frequently She states good support from her childrenespecially her daughter, Stephanie Horne ([place] grandchildren. She reports being independent with ADLs but need assist with iADLs, She reports she can still clean her house. She reports assistance from her family for transportation prn Stephanie Horne picks up her medicine   Conditions:HTN, atrial fibrillation, pulmonary HTN, severe mitral regurgitation, CAD, diastolic CHF, hearing loss, HOH, alzheimer's dementia without behavioral disturbance, low back pain, vitamin D deficiency, Insomnia, hx of autoimmune hemolytic anemia, osteoporosis of forearm without pathological fx allergic rhinitis., hx of falls at home, HLD, DM  DME:cbg monitor  PlansTHN RN CM will follow up in the next 7-14 days if not reach today 09/24/19  and plan for case closure if no further needs identified  Stephanie Horne CM Care Plan Problem One     Most Recent Value  Care Plan Problem One  pharmacy needs  (Pended)   Role Documenting the Problem One  Care Management Telephonic Coordinator  (Pended)   Care Plan for Problem One  Active  (Pended)   THN  Long Term Goal   Patient will receive [facility] pharmacy services  (Pended)   [facility] Long Term Goal Start Date  09/11/19  (Pended)   THN Long Term Goal Met Date  09/11/19  (Pended)   THN CM Short Term Goal #1   THn phramacy services to be offered as evidence by verbalization during contact with patient/daughter  (Pended)   THN CM Short Term Goal #1 Start Date  09/11/19  (Pended)   THN CM Short Term Goal #1 Met Date  09/11/19  (Pended)     [facility] CM Care Plan Problem Two     Most Recent Value  Care Plan Problem Two  home care services request  (Pended)   Role Documenting the Problem Two  Care Management Telephonic Coordinator  (Pended)   [place] for Problem Two  Active  (Pended)   THN Long Term Goal  Patient will be evaluated by primary care MD for necessity of home health services and orders provided prn as evidence by verbalization or orders noted in EPIC  (Pended)   [place] Term Goal Start Date  09/11/19  (Pended)   THN CM Short Term Goal #1   over the next 10 days patient will have contact from MD office about home health services  (Pended)   [facility] Horne CM Short Term Goal #1 Start Date  09/11/19  (Pended)       [place] L. [name], RN, BSN, [place] Coordinator Office number [phone] Mobile number [phone])  [place] Horne number [phone] Fax number [phone]

## 2019-10-02 ENCOUNTER — Other Ambulatory Visit: Payer: Self-pay | Admitting: *Deleted

## 2019-10-02 NOTE — Patient Outreach (Signed)
  [facility]) Care Management  10/02/2019  [name] [birth year] [id number]   Follow up call to follow up on SW needs  She was referred to [facility] in August 2020 from [name], pharmacy for medication cost help with Eliquis patient assistance program  Consent: [facility] RN CM reviewed [facility] services with patient previously. Patient gave verbal consent for servicesfor [facility] telephonic RN CM to speak with her [name] Pt with memory concerns  She informed [facility] RN CM that RN CM could call her [name] during all previous calls  Outreach to [name] to f/u on [place] list [facility] [place] personal care service and to see if there were any other needs for Mrs Szewczyk She is not able to speak at this time and She states she will try to call CM back    Social:[name] is a 78 year old female who reports she lives alone but with family who check on her frequently She states good support from her childrenespecially her daughter, [name] ([place] grandchildren. She reports being independent with ADLs but need assist with iADLs, She reports she can still clean her house. She reports assistance from her family for transportation prn [name] picks up her medicine   Conditions:HTN, atrial fibrillation, pulmonary HTN, severe mitral regurgitation, CAD, diastolic CHF, hearing loss, HOH, alzheimer's dementia without behavioral disturbance, low back pain, vitamin D deficiency, Insomnia, hx of autoimmune hemolytic anemia, osteoporosis of forearm without pathological fx allergic rhinitis., hx of falls at home, HLD, DM  DME:cbg monitor  PlansTHN RN CM willfollow upin the next 7-14 days if not reach today 09/24/19 and plan for case closure if no further needs identified  Fotios Amos L. [name], RN, BSN, [place] Coordinator Office number [phone] Mobile number [phone]  Main [facility] number [phone] Fax number [phone]

## 2019-10-07 ENCOUNTER — Other Ambulatory Visit: Payer: Self-pay

## 2019-10-07 MED ORDER — OMEPRAZOLE 20 MG PO CPDR: 20.0000 mg | DELAYED_RELEASE_CAPSULE | Freq: Every day | ORAL | 0 refills | Status: DC

## 2019-10-09 ENCOUNTER — Other Ambulatory Visit: Payer: Self-pay

## 2019-10-09 ENCOUNTER — Other Ambulatory Visit: Payer: Self-pay | Admitting: *Deleted

## 2019-10-09 NOTE — Patient Outreach (Signed)
[facility]) Care Management  10/09/2019  VERTA RIEDLINGER 04-Jun-1941 [id number]   Follow up call to follow up onSW needs  She was referred to [facility] in August 2020 from [name], pharmacy for medication cost help with Eliquis patient assistance program The medication concerns have been addressed in December 2020 by [facility] staff  Insurance united healthcare medicare   Consent: [facility] RN CM reviewed [facility] services with patientpreviously. Patient gave verbal consent for servicesfor [facility] telephonic RN CMto speak with her daughter Stephanie Horne Pt with memory concerns She informed [facility] RN CM that RN CM could call her daughter [place] all previous calls  Outreach to Stephanie Horne to f/u on [place] list [facility] [place] personal care service and to see if there were any other needs for Mrs Jeanett Antonopoulos Daughter had not attempted to return a call to [facility] RN CM after the last call attempt on 10/02/19  Stephanie Horne was able to verify HIPAA ( Mrs Sidney's DOB and address)  Today Stephanie Horne states she is driving and audible background noise heard With each [facility] RN CM previous contact Stephanie Horne has reported to be out completing tasks or not able to speak   Reviewed covid symptoms and all symptoms denied as Stephanie Horne reports she keeps Mrs Urbieta isolated  [facility] SW services for personal care services Stephanie Horne confirms contact made by [facility] SW but Stephanie Horne was not able to speak at the time as Stephanie Horne recalls she was hospitalized related to her own medical issues of Lupus. Stephanie Horne was encourage to return a call to [place]   Primary care visit Stephanie Horne had previously inquired about how to get Mrs Pancake seen by a medical provider during the covid pandemic and related to it is tasking for her to get pt out of the home. [facility] RN CM had previously reviewed options of visiting MD but Stephanie Horne confirms again today the preference is for Stephanie Horne to transport Mrs Whisnant to Dr [employer] office as the preference is not to change primary care providers. Stephanie Horne reports  the last MD visit was approximately October or November 2020. Epic indicate last office visit with Dr Heber [place] was 07/25/19 and a telemedicine visit with Dr Ezzie Dural, cardiology on 01/04/19. Stephanie Horne confirms these are the only to medical providers Mrs Brazill sees.  [facility] RN CM discussed the availability of her calling the office to report symptoms to the patient's MD, Telemedicine visits and teleconferences visit options for MD visits during the covid pandemic with Stephanie Horne but her preference is face to face visit at this time   Medical concern -Diarrhea Stephanie Horne reports Mrs Marsan has been having increased diarrhea that is not a recent but ongoing occurrence that she reports Dr Heber Beaver is aware as Mrs Riches had diarrhea during the 07/25/19 MD visit (refer to MD notes) her Aricept was discontinued and namenda started Stephanie Horne reports she had been providing Mrs Kalama with take out food but recently changed to only home cooked meals and the diarrhea continues. [name] reports Mrs Yearick is 5'11" at 135 lbs presently. With brief review of medications the pt is noted to have been on Januvia, Metformin and Prilosec. [facility] RN CM discussed possible causes of diarrhea to include c. difficile, h pylori and side effects of metformin. Mrs Dolle is reported by Stephanie Horne to be lactose intolerant also.  Pt is not seen by a gastroenterologist Stephanie Horne was encouraged to discuss this with Dr Heber  s possible tests sn stool specimen may be needed. She voiced understanding  Continued [facility] services  Discussed continued [facility] services status related to difficulty in maintaining contact Stephanie Horne states preference is to check on patient needs quarterly   Call to MD office no answer after more then 10 rings x 2 - [phone]   [facility] RN CM sent an Epic in basket message to Dr Heber [place] inquiring about assistance with increased diarrhea concern.  Discussed for scheduling may be best to contact daughter Stephanie Horne   Social:Mrs Takeshita is a 79 year old female who reports  she lives alone but with family who check on her frequently She states good support from her childrenespecially her daughter, Stephanie Horne (POA)and grandchildren. She reports being independent with ADLs but need assist with iADLs, She reports she can still clean her house. She reports assistance from her family for transportation prn Stephanie Horne picks up her medicine  Stephanie Horne her daughter has medical concerns also (Lupus)   Conditions:HTN, atrial fibrillation, pulmonary HTN, severe mitral regurgitation, CAD, diastolic CHF, hearing loss, HOH, alzheimer's dementia without behavioral disturbance, low back pain, vitamin D deficiency, Insomnia, hx of autoimmune hemolytic anemia, osteoporosis of forearm without pathological fx allergic rhinitis., hx of falls at home, HLD, DM  DME:cbg monitor  [facility] RN CM will follow up with Mrs Cinque and her daughter Stephanie Horne as agreed quarterly Next contact within the next 91-98 days   Pt encouraged to return a call to [facility] RN CM prn  Routed note to MD   [place] Problem One     Most Recent Value  Care Plan Problem One  pharmacy needs  Role Documenting the Problem One  Care Management Telephonic Coordinator  Care Plan for Problem One  Active  [facility] Long Term Goal   Patient will receive [facility] pharmacy services  [facility] Long Term Goal Start Date  09/11/19  THN Long Term Goal Met Date  09/11/19  THN CM Short Term Goal #1   THn phramacy services to be offered as evidence by verbalization during contact with patient/daughter  [facility] CM Short Term Goal #1 Start Date  09/11/19  [facility] CM Short Term Goal #1 Met Date  09/11/19    [facility] CM Care Plan Problem Two     Most Recent Value  Care Plan Problem Two  home care services request  Role Documenting the Problem Two  Care Management Telephonic [place] for Problem Two  Active  Interventions for Problem Two Long Term Goal   assessed, offered to assist, reviewed medical concerns offered recommendations referred to MD  offerred options of MD visits  [facility] Long Term Goal  Patient will be evaluated by primary care MD for necessity of home health services and orders provided prn as evidence by verbalization or orders noted in EPIC over the next 90 days  THN Long Term Goal Start Date  09/11/19  [facility] CM Short Term Goal #1   over the next 60 days patient will have contact from MD office about increased diarrhea and home health services  [facility] CM Short Term Goal #1 Start Date  10/09/19  Interventions for Short Term Goal #2   assessed, offered to assist, reviewed medical concerns offered recommendations referred to MD offerred options of MD visits      Donnika Kucher L. [name], RN, BSN, [place] Coordinator Office number [phone] Mobile number [phone]  Main THN number [phone] Fax number [phone]

## 2019-10-10 NOTE — Telephone Encounter (Signed)
Talked to pt's daughter, [name] - stated pt is having diarrhea and losing weight; not eating. She's keeping her hydrated with Pedialyte, gatorade and water. Stated she has cooking instead of buying fast food. Stated pt is concern about gaining weight. Prefer telehealth appt d/t covid and difficulty bringing pt to the office.  Telehealth appt scheduled tomorrow @ 0945 AM.

## 2019-10-10 NOTE — Telephone Encounter (Signed)
-----   Message from Lucious Groves, DO sent at 10/10/2019 11:06 AM EST ----- Regarding: FW: Diarrhea symptoms  ----- Message ----- From: Barbaraann Faster, RN Sent: 10/09/2019   3:17 PM EST To: Lucious Groves, DO Subject: Diarrhea symptoms                              Dr Heber Dent I have made several attempt to call the ofifce today at [phone] without an answer Not sure if there are telephone issues  Spoke with patients daughter who reports increased diarrhea  Can you assist with a televisit or telemedicine contact? With scheduling staff may need to call her [name] FIRST   Thank you in advance   [place]. [name], RN, BSN, [place] Coordinator Office number [phone] Mobile number [phone]  Main [facility] number [phone] Fax number [phone]

## 2019-10-10 NOTE — Telephone Encounter (Signed)
Thank you glenda ?

## 2019-10-11 ENCOUNTER — Ambulatory Visit: Payer: Medicare Other

## 2019-10-15 ENCOUNTER — Ambulatory Visit: Payer: Medicare Other

## 2019-10-15 ENCOUNTER — Telehealth: Payer: Self-pay | Admitting: Internal Medicine

## 2019-10-15 ENCOUNTER — Other Ambulatory Visit: Payer: Self-pay | Admitting: *Deleted

## 2019-10-15 DIAGNOSIS — I10 Essential (primary) hypertension: Secondary | ICD-10-CM

## 2019-10-15 NOTE — Telephone Encounter (Signed)
I returned the call to [name] (daughter), also reviewed the note from Jackelyn Poling our [place].  Coralyn Mark related to me that her mother has continued to decline with weight loss and ongoing diarrhea.  She does not get out of the house and is very concerned about the Covid pandemic.  I tried to address as many issues as I could over the telephone.  For her diarrhea at the last visit we had discussed discontinuation of Metformin and substitution of sitagliptin however sitagliptin was too expensive and she returned to taking metformin Tera thinks that she may have had some mild improvement in diarrhea.  I discussed that we should go ahead with discontinuation of Metformin given her weight loss I am not sure that she needs any medication.  For her fatigue and anorexia, I discussed with [name] that I think this could be medication induced and we went over multiple medications we are discontinuing Metformin.  However in going through her medication bottles we realized that she has a full bottle of prednisone that was filled in November.  She has also noticed some darkening of her mother skin.  I discussed with this that it is possible that her mother is experiencing adrenal insufficiency from lack of prednisone I want her to make sure that she is getting at least 7.5 milligrams of prednisone daily.  She continues to have rhinorrhea this has been resistant to multiple antihistamines as well as steroid and nonsteroid nasal sprays.  I discussed that this is likely due to the side effect from donepezil, we discussed again potentially discontinuing this medication however [name] is hesitant because when we last discontinued it she felt that her mother had a decline and was more agitated at memory loss.  She is okay with continuing Aricept as the rhinorrhea is tolerable at this time.  We discussed other medications including pravastatin and metoprolol as well as Namenda I think it is reasonable to continue all these  medications.  Given her poor p.o. intake I will have her change Lasix to 40 mg daily as needed for weight gain or leg swelling.  She also notes that her mother is unable to get around the house the lightweight wheelchair at that we had tried to obtain at last visit apparently regular wheelchair and not very maneuverable she is wondering if she needs a motorized wheelchair.  We discussed that I will try to see her in person for follow-up in February.

## 2019-10-15 NOTE — Telephone Encounter (Signed)
Pt daughter wants Dr Heber Gerrard to call her [phone]

## 2019-10-15 NOTE — Telephone Encounter (Signed)
Stated she forgot to request refill on Metoprolol when she was talking to Dr Heber [place].

## 2019-10-16 MED ORDER — METOPROLOL TARTRATE 50 MG PO TABS: ORAL_TABLET | ORAL | 1 refills | Status: DC

## 2019-10-16 NOTE — Telephone Encounter (Signed)
Spoke to the patient's [name].  Appt has been sch for an in person visit on 11/28/2019 @ 8:15 am.

## 2019-11-15 ENCOUNTER — Other Ambulatory Visit: Payer: Self-pay | Admitting: *Deleted

## 2019-11-19 MED ORDER — DONEPEZIL HCL 5 MG PO TABS: 5.0000 mg | ORAL_TABLET | Freq: Every day | ORAL | 0 refills | Status: DC

## 2019-11-28 ENCOUNTER — Other Ambulatory Visit: Payer: Self-pay

## 2019-11-28 ENCOUNTER — Ambulatory Visit (INDEPENDENT_AMBULATORY_CARE_PROVIDER_SITE_OTHER): Payer: Medicare Other | Admitting: Internal Medicine

## 2019-11-28 ENCOUNTER — Encounter: Payer: Self-pay | Admitting: Internal Medicine

## 2019-11-28 VITALS — BP 118/66 | HR 80 | Temp 98.2°F | Ht 70.0 in | Wt 123.2 lb

## 2019-11-28 DIAGNOSIS — Z681 Body mass index (BMI) 19 or less, adult: Secondary | ICD-10-CM

## 2019-11-28 DIAGNOSIS — Z79899 Other long term (current) drug therapy: Secondary | ICD-10-CM

## 2019-11-28 DIAGNOSIS — Z7901 Long term (current) use of anticoagulants: Secondary | ICD-10-CM

## 2019-11-28 DIAGNOSIS — I10 Essential (primary) hypertension: Secondary | ICD-10-CM

## 2019-11-28 DIAGNOSIS — E119 Type 2 diabetes mellitus without complications: Secondary | ICD-10-CM | POA: Diagnosis not present

## 2019-11-28 DIAGNOSIS — I38 Endocarditis, valve unspecified: Secondary | ICD-10-CM

## 2019-11-28 DIAGNOSIS — G301 Alzheimer's disease with late onset: Secondary | ICD-10-CM

## 2019-11-28 DIAGNOSIS — R634 Abnormal weight loss: Secondary | ICD-10-CM | POA: Diagnosis not present

## 2019-11-28 DIAGNOSIS — M7989 Other specified soft tissue disorders: Secondary | ICD-10-CM

## 2019-11-28 DIAGNOSIS — I5032 Chronic diastolic (congestive) heart failure: Secondary | ICD-10-CM

## 2019-11-28 DIAGNOSIS — E559 Vitamin D deficiency, unspecified: Secondary | ICD-10-CM

## 2019-11-28 DIAGNOSIS — I4821 Permanent atrial fibrillation: Secondary | ICD-10-CM

## 2019-11-28 DIAGNOSIS — F0280 Dementia in other diseases classified elsewhere without behavioral disturbance: Secondary | ICD-10-CM

## 2019-11-28 DIAGNOSIS — D591 Autoimmune hemolytic anemia, unspecified: Secondary | ICD-10-CM

## 2019-11-28 DIAGNOSIS — D649 Anemia, unspecified: Secondary | ICD-10-CM

## 2019-11-28 DIAGNOSIS — I700 Atherosclerosis of aorta: Secondary | ICD-10-CM

## 2019-11-28 DIAGNOSIS — H9190 Unspecified hearing loss, unspecified ear: Secondary | ICD-10-CM | POA: Diagnosis not present

## 2019-11-28 DIAGNOSIS — I4891 Unspecified atrial fibrillation: Secondary | ICD-10-CM

## 2019-11-28 DIAGNOSIS — L97319 Non-pressure chronic ulcer of right ankle with unspecified severity: Secondary | ICD-10-CM | POA: Diagnosis not present

## 2019-11-28 DIAGNOSIS — R011 Cardiac murmur, unspecified: Secondary | ICD-10-CM

## 2019-11-28 DIAGNOSIS — G309 Alzheimer's disease, unspecified: Secondary | ICD-10-CM

## 2019-11-28 DIAGNOSIS — I340 Nonrheumatic mitral (valve) insufficiency: Secondary | ICD-10-CM

## 2019-11-28 DIAGNOSIS — I83013 Varicose veins of right lower extremity with ulcer of ankle: Secondary | ICD-10-CM

## 2019-11-28 DIAGNOSIS — I272 Pulmonary hypertension, unspecified: Secondary | ICD-10-CM

## 2019-11-28 DIAGNOSIS — Z862 Personal history of diseases of the blood and blood-forming organs and certain disorders involving the immune mechanism: Secondary | ICD-10-CM

## 2019-11-28 LAB — POCT GLYCOSYLATED HEMOGLOBIN (HGB A1C): Hemoglobin A1C: 6.5 % — AB (ref 4.0–5.6)

## 2019-11-28 LAB — GLUCOSE, CAPILLARY: Glucose-Capillary: 98 mg/dL (ref 70–99)

## 2019-11-28 MED ORDER — TRAZODONE HCL 50 MG PO TABS: 50.0000 mg | ORAL_TABLET | Freq: Every evening | ORAL | 3 refills | Status: DC | PRN

## 2019-11-28 NOTE — Patient Instructions (Signed)
I will call with the lab results.  For your leg ulcer, stop using peroxide. I want you to use Vaseline and leg elevation.  If it is not improving, please call and I will send you to a specialist.

## 2019-11-29 DIAGNOSIS — D649 Anemia, unspecified: Secondary | ICD-10-CM | POA: Insufficient documentation

## 2019-11-29 DIAGNOSIS — Z681 Body mass index (BMI) 19 or less, adult: Secondary | ICD-10-CM | POA: Insufficient documentation

## 2019-11-29 DIAGNOSIS — I83013 Varicose veins of right lower extremity with ulcer of ankle: Secondary | ICD-10-CM | POA: Insufficient documentation

## 2019-11-29 LAB — CMP14 + ANION GAP
ALT: 10 IU/L (ref 0–32)
AST: 17 IU/L (ref 0–40)
Albumin/Globulin Ratio: 1.3 (ref 1.2–2.2)
Albumin: 3.7 g/dL (ref 3.7–4.7)
Alkaline Phosphatase: 62 IU/L (ref 39–117)
Anion Gap: 14 mmol/L (ref 10.0–18.0)
BUN/Creatinine Ratio: 12 (ref 12–28)
BUN: 11 mg/dL (ref 8–27)
Bilirubin Total: 0.3 mg/dL (ref 0.0–1.2)
CO2: 26 mmol/L (ref 20–29)
Calcium: 9.2 mg/dL (ref 8.7–10.3)
Chloride: 97 mmol/L (ref 96–106)
Creatinine, Ser: 0.89 mg/dL (ref 0.57–1.00)
GFR calc Af Amer: 71 mL/min/{1.73_m2} (ref >59)
GFR calc non Af Amer: 61 mL/min/{1.73_m2} (ref >59)
Globulin, Total: 2.8 g/dL (ref 1.5–4.5)
Glucose: 105 mg/dL — ABNORMAL HIGH (ref 65–99)
Potassium: 3.7 mmol/L (ref 3.5–5.2)
Sodium: 137 mmol/L (ref 134–144)
Total Protein: 6.5 g/dL (ref 6.0–8.5)

## 2019-11-29 LAB — CBC WITH DIFFERENTIAL/PLATELET
Basophils Absolute: 0 10*3/uL (ref 0.0–0.2)
Basos: 0 %
EOS (ABSOLUTE): 0 10*3/uL (ref 0.0–0.4)
Eos: 1 %
Hematocrit: 26.7 % — ABNORMAL LOW (ref 34.0–46.6)
Hemoglobin: 8.5 g/dL — ABNORMAL LOW (ref 11.1–15.9)
Immature Grans (Abs): 0 10*3/uL (ref 0.0–0.1)
Immature Granulocytes: 0 %
Lymphocytes Absolute: 1.8 10*3/uL (ref 0.7–3.1)
Lymphs: 32 %
MCH: 30.2 pg (ref 26.6–33.0)
MCHC: 31.8 g/dL (ref 31.5–35.7)
MCV: 95 fL (ref 79–97)
Monocytes Absolute: 0.7 10*3/uL (ref 0.1–0.9)
Monocytes: 13 %
Neutrophils Absolute: 3.1 10*3/uL (ref 1.4–7.0)
Neutrophils: 54 %
Platelets: 206 10*3/uL (ref 150–450)
RBC: 2.81 x10E6/uL — ABNORMAL LOW (ref 3.77–5.28)
RDW: 13.5 % (ref 11.7–15.4)
WBC: 5.7 10*3/uL (ref 3.4–10.8)

## 2019-11-29 LAB — LIPID PANEL
Chol/HDL Ratio: 2.3 ratio (ref 0.0–4.4)
Cholesterol, Total: 142 mg/dL (ref 100–199)
HDL: 62 mg/dL (ref >39)
LDL Chol Calc (NIH): 62 mg/dL (ref 0–99)
Triglycerides: 98 mg/dL (ref 0–149)
VLDL Cholesterol Cal: 18 mg/dL (ref 5–40)

## 2019-11-29 LAB — TSH: TSH: 1.55 u[IU]/mL (ref 0.450–4.500)

## 2019-11-29 LAB — VITAMIN D 25 HYDROXY (VIT D DEFICIENCY, FRACTURES): Vit D, 25-Hydroxy: 97.9 ng/mL (ref 30.0–100.0)

## 2019-11-29 LAB — VITAMIN B12: Vitamin B-12: 321 pg/mL (ref 232–1245)

## 2019-11-29 NOTE — Assessment & Plan Note (Signed)
This is chronic and stable. Denies any increased dyspnea.

## 2019-11-29 NOTE — Assessment & Plan Note (Signed)
HPI: Patient presents with ulceration on her right ankle.  She has been treating this at home with peroxide daily.  She believes it started about 2 months ago has not significantly grown or decreased in size.  Assessment 1 cm x 1 cm venous ulcer of right ankle nonhealing  Plan Discussed the nature of venous ulcers I do not think this is infected.  Would discontinue peroxide advised use of Vaseline.  Leg elevation/compression if tolerated.  Discussed with [name] if this does not improved or worsens I would like to send to a wound care specialist.

## 2019-11-29 NOTE — Assessment & Plan Note (Signed)
Taking prednisone. Repeat CBC>> returned with decreased Hgb> normocytic anemia.  Additional workup planned.

## 2019-11-29 NOTE — Assessment & Plan Note (Signed)
Improved, stable, on supplement, Recheck Vit D level

## 2019-11-29 NOTE — Assessment & Plan Note (Signed)
Patient continues to lose weight.  She has multiple chronic conditions which can lead to this weight loss.  She also has an extensive medication list which I am working to deprescribing.  We have stopped Namenda and Metformin on recent telephone conversations.  Going forward I would like to probably try to discontinue omeprazole. I do not have any clear source of her weight loss and decreased appetite.  I did obtained extensive labs today and progressing to work-up as needed.

## 2019-11-29 NOTE — Assessment & Plan Note (Signed)
HPI: Patient has history of hemolytic anemia she is on chronic prednisone 7.5 mg a day for this.  Additionally she has had decreased appetite over the last several months. After the visit when I called Eustace Pen to discuss lab results and obtained additional information she has been taking over-the-counter zinc for possible COVID-19 benefits.  Assessment normocytic anemia history of hemolytic anemia  Plan Initially we checked a CBC this returned back with a hemoglobin of 8.5 which is a significant drop from her last test which was about a year ago.  Her bilirubin is normal. I suspect this may be nutritional but certainly hemolytic anemia could be a possibility.  I will obtain LDH haptoglobin peripheral smear, reticulocyte count, iron studies, copper level and folate.  Her B12 level is normal.

## 2019-11-29 NOTE — Assessment & Plan Note (Signed)
Chronic and stable, Due for follow up with Dr Burt Knack, reminded patient and daughter of this.

## 2019-11-29 NOTE — Assessment & Plan Note (Signed)
HPI: No issues  Assessment essential hypertension   Plan continue metoprolol tartrate 50 mg twice daily continue to take Lasix 40 mg daily as needed for edema

## 2019-11-29 NOTE — Assessment & Plan Note (Signed)
HPI: Stephanie Horne reports diarrhea has improved since she stopped taking metformin.  No noted polydispia or polyuria.  A: Diet controlled T2DM  P: Repeat A1c at 6.5%, continue off metformin.

## 2019-11-29 NOTE — Assessment & Plan Note (Signed)
HPI: Taking lasix as needed, no increased dysnpea however is limited in ambulation, has venous ulcer and some asymetic edema of right leg.    A: Chronic diastolic CHF  P: Continue current care plan

## 2019-11-29 NOTE — Assessment & Plan Note (Signed)
HPI: [facility] has been helpful in medication assistance.  Assessment permanent A. Fib  Plan Continue Eliquis Continue metoprolol Follow up with Dr Burt Knack within next 6 months

## 2019-11-29 NOTE — Assessment & Plan Note (Signed)
Continue pravastatin 40 mg daily. 

## 2019-11-29 NOTE — Progress Notes (Signed)
Subjective:  HPI: [name] is a 79 y.o. female who presents for f/u weight loss, DM, Autoimmune hemolytic anemia, she is accompanied by her daughter Stephanie Horne. She also informs me that she has gotten her first COVID-19 vaccine and is due for the second dose tomorrow.  Please see Assessment and Plan below for the status of her chronic medical problems.  Review of Systems: Review of Systems  Constitutional: Positive for weight loss. Negative for chills, fever and malaise/fatigue.  HENT: Positive for hearing loss. Negative for tinnitus.   Eyes: Negative for blurred vision.  Respiratory: Negative for cough and shortness of breath.   Cardiovascular: Positive for leg swelling. Negative for chest pain.  Gastrointestinal: Negative for diarrhea and heartburn.  Genitourinary: Negative for dysuria and hematuria.  Musculoskeletal: Negative for joint pain and myalgias.  Skin: Negative for rash.  Neurological: Negative for headaches.  Endo/Heme/Allergies: Negative for polydipsia.    Objective:  Physical Exam: Vitals:   11/28/19 0831  BP: 118/66  Pulse: 80  Temp: 98.2 F (36.8 C)  TempSrc: Oral  Weight: 123 lb 3.2 oz (55.9 kg)  Height: _0  (1.778 m)   Body mass index is 17.68 kg/m. Physical Exam Vitals and nursing note reviewed.  Constitutional:      Appearance: Normal appearance.     Comments: HOH, doesn't have hearing aid in, also reports difficulty due to mask use  HENT:     Head: Normocephalic and atraumatic.  Cardiovascular:     Rate and Rhythm: Rhythm irregular.     Heart sounds: Murmur present.  Pulmonary:     Effort: Pulmonary effort is normal.     Breath sounds: Normal breath sounds. No wheezing or rhonchi.  Abdominal:     General: Abdomen is flat.     Palpations: Abdomen is soft.  Musculoskeletal:     Right lower leg: Edema present.     Left lower leg: No edema.     Comments: Muscle wasting noted bilateral hands. 1+ edema of right lower extremity 1cm by  1cm clean ulcer over lateral aspect of right ankle, picture taken  Skin:    Capillary Refill: Capillary refill takes less than 2 seconds.  Neurological:     Mental Status: She is alert.      Assessment & Plan:  See Encounters Tab for problem based charting.  Medications Ordered Meds ordered this encounter  Medications  . traZODone (DESYREL) 50 MG tablet    Sig: Take 1 tablet (50 mg total) by mouth at bedtime as needed for sleep.    Dispense:  90 tablet    Refill:  3   Other Orders Orders Placed This Encounter  Procedures  . Glucose, capillary  . CMP14 + Anion Gap  . TSH  . CBC with Diff  . Lipid Profile  . Vitamin D (25 hydroxy)  . Vitamin B12  . Ferritin    Standing Status:   Future    Standing Expiration Date:   11/28/2020  . LDH    Standing Status:   Future    Standing Expiration Date:   11/28/2020  . Reticulocytes Count    Standing Status:   Future    Standing Expiration Date:   11/28/2020  . Pathologist smear review    Hx of Hemolytic anemia    Standing Status:   Future    Standing Expiration Date:   11/28/2020  . Haptoglobin    Standing Status:   Future    Standing Expiration Date:   11/28/2020  .  Iron and IBC ([id number])    Standing Status:   Future    Standing Expiration Date:   11/28/2020  . RBC Folate    Standing Status:   Future    Standing Expiration Date:   11/28/2020  . Multiple Myeloma Panel (SPEP&IFE w/QIG)    Standing Status:   Future    Standing Expiration Date:   11/28/2020  . Copper, Serum    Standing Status:   Future    Standing Expiration Date:   11/28/2020  . POC Hbg A1C  . POCT ABI Screening Pilot No Charge    This Order is for screening for the ABI Pilot.  It is a no charge exam.    Follow Up: Return in about 3 months (around 02/25/2020), or if symptoms worsen or fail to improve.

## 2019-11-29 NOTE — Assessment & Plan Note (Signed)
HPI: Stephanie Horne has been giving her aricept which does have side effect of rhinorhea.  However recently she has stopped namenda, not sure if this was truly helping.  A: Alzheimer's dementia  P: Given ongoing weight loss and comorbid contitions I reminded Stephanie Horne to try to set aside time for advance directive planning.  Given Stephanie Horne's hearing aid is not working today it would be difficult to get her input.  Stephanie Horne has advancted directive information and plans to have a family meeting with her brother too to try to create advanced directvie.

## 2019-12-09 DIAGNOSIS — E559 Vitamin D deficiency, unspecified: Secondary | ICD-10-CM | POA: Diagnosis not present

## 2019-12-09 DIAGNOSIS — G301 Alzheimer's disease with late onset: Secondary | ICD-10-CM | POA: Diagnosis not present

## 2019-12-09 DIAGNOSIS — E119 Type 2 diabetes mellitus without complications: Secondary | ICD-10-CM | POA: Diagnosis not present

## 2019-12-09 DIAGNOSIS — I4821 Permanent atrial fibrillation: Secondary | ICD-10-CM | POA: Diagnosis not present

## 2019-12-10 LAB — CMP14 + ANION GAP
ALT: 12 IU/L (ref 0–32)
AST: 17 IU/L (ref 0–40)
Albumin/Globulin Ratio: 1.4 (ref 1.2–2.2)
Albumin: 4.1 g/dL (ref 3.7–4.7)
Alkaline Phosphatase: 78 IU/L (ref 39–117)
Anion Gap: 17 mmol/L (ref 10.0–18.0)
BUN/Creatinine Ratio: 16 (ref 12–28)
BUN: 14 mg/dL (ref 8–27)
Bilirubin Total: 0.4 mg/dL (ref 0.0–1.2)
CO2: 23 mmol/L (ref 20–29)
Calcium: 10.2 mg/dL (ref 8.7–10.3)
Chloride: 97 mmol/L (ref 96–106)
Creatinine, Ser: 0.85 mg/dL (ref 0.57–1.00)
GFR calc Af Amer: 75 mL/min/{1.73_m2} (ref >59)
GFR calc non Af Amer: 65 mL/min/{1.73_m2} (ref >59)
Globulin, Total: 3 g/dL (ref 1.5–4.5)
Glucose: 138 mg/dL — ABNORMAL HIGH (ref 65–99)
Potassium: 4.5 mmol/L (ref 3.5–5.2)
Sodium: 137 mmol/L (ref 134–144)
Total Protein: 7.1 g/dL (ref 6.0–8.5)

## 2019-12-10 LAB — VITAMIN B12: Vitamin B-12: 342 pg/mL (ref 232–1245)

## 2019-12-10 LAB — TSH: TSH: 1.45 u[IU]/mL (ref 0.450–4.500)

## 2019-12-10 LAB — CBC WITH DIFFERENTIAL/PLATELET
Basophils Absolute: 0 10*3/uL (ref 0.0–0.2)
Basos: 1 %
EOS (ABSOLUTE): 0.1 10*3/uL (ref 0.0–0.4)
Eos: 1 %
Hematocrit: 26.9 % — ABNORMAL LOW (ref 34.0–46.6)
Hemoglobin: 8.3 g/dL — ABNORMAL LOW (ref 11.1–15.9)
Immature Grans (Abs): 0 10*3/uL (ref 0.0–0.1)
Immature Granulocytes: 0 %
Lymphocytes Absolute: 1.3 10*3/uL (ref 0.7–3.1)
Lymphs: 22 %
MCH: 30.1 pg (ref 26.6–33.0)
MCHC: 30.9 g/dL — ABNORMAL LOW (ref 31.5–35.7)
MCV: 98 fL — ABNORMAL HIGH (ref 79–97)
Monocytes Absolute: 0.5 10*3/uL (ref 0.1–0.9)
Monocytes: 9 %
Neutrophils Absolute: 3.8 10*3/uL (ref 1.4–7.0)
Neutrophils: 67 %
Platelets: 286 10*3/uL (ref 150–450)
RBC: 2.76 x10E6/uL — ABNORMAL LOW (ref 3.77–5.28)
RDW: 13.2 % (ref 11.7–15.4)
WBC: 5.7 10*3/uL (ref 3.4–10.8)

## 2019-12-10 LAB — LIPID PANEL
Chol/HDL Ratio: 2.1 ratio (ref 0.0–4.4)
Cholesterol, Total: 149 mg/dL (ref 100–199)
HDL: 70 mg/dL (ref >39)
LDL Chol Calc (NIH): 62 mg/dL (ref 0–99)
Triglycerides: 94 mg/dL (ref 0–149)
VLDL Cholesterol Cal: 17 mg/dL (ref 5–40)

## 2019-12-10 LAB — VITAMIN D 25 HYDROXY (VIT D DEFICIENCY, FRACTURES): Vit D, 25-Hydroxy: 120 ng/mL — ABNORMAL HIGH (ref 30.0–100.0)

## 2019-12-16 ENCOUNTER — Other Ambulatory Visit: Payer: Medicare Other

## 2019-12-16 ENCOUNTER — Other Ambulatory Visit: Payer: Self-pay | Admitting: *Deleted

## 2019-12-16 MED ORDER — OMEPRAZOLE 20 MG PO CPDR: 20.0000 mg | DELAYED_RELEASE_CAPSULE | Freq: Every day | ORAL | 0 refills | Status: DC

## 2019-12-16 NOTE — Addendum Note (Signed)
Addended by: Truddie Crumble on: 12/16/2019 10:57 AM   Modules accepted: Orders

## 2019-12-20 ENCOUNTER — Ambulatory Visit (INDEPENDENT_AMBULATORY_CARE_PROVIDER_SITE_OTHER): Payer: Medicare Other | Admitting: Internal Medicine

## 2019-12-20 ENCOUNTER — Encounter: Payer: Self-pay | Admitting: Internal Medicine

## 2019-12-20 ENCOUNTER — Other Ambulatory Visit: Payer: Self-pay

## 2019-12-20 DIAGNOSIS — R197 Diarrhea, unspecified: Secondary | ICD-10-CM

## 2019-12-20 NOTE — Assessment & Plan Note (Signed)
Spoke with patient and her son, Timmothy Sours, about her recent diarrhea. She has had 2 days of loose/watery stool, about 5 or more episodes per day. Stool is brown in coloration. No recent antibiotic use, no unusual or undercooked foods, no fever. They have been given her imodium, which has not helped much. Her son is starting her on a "BRAT" diet today to help ease her in to solid food and having her drink water and Gatorade (and avoid soda and juice). She is tolerating her diet and fluids well. He does not feel she is getting dehydrated.  He notes that she did complete her pfizer COVID-19 vaccination series >2 weeks ago, so Covid is low on the differential. Do not suspect bacterial etiology at this time. Possible that this is some other viral gastroenteritis. If viral I would expect it to resolve over the next couple of days. They are instructed to continue with supportive care and contact us if her condition worsens or fails to improve in the next 2 days.  I did report some of her recent lab results to them as requested. They states she has had issues getting her next set of labs due to issues drawing her blood and [facility] and now she schedule to have her blood drawn at the hospital. He has not been abel to bring her in for the last 2 days due to her diarrhea. - Continue Imodium - Continue to remain hydrated - Continue to ease in solid foods - Monitor for fever or worsening of symptoms - Return precautions given

## 2019-12-20 NOTE — Progress Notes (Signed)
  This is a telephone encounter between Stephanie Horne and Stephanie Horne on 12/20/2019. The visit was conducted with the patient located at home and Stephanie Horne at [facility]. The patient's identity was confirmed using their DOB and current address. The patient and her Son, Stephanie Horne, consented to being evaluated through a telephone encounter and understands the associated risks (an examination cannot be done and the patient may need to come in for an appointment) / benefits (allows the patient to remain at home, decreasing exposure to coronavirus). I personally spent 11 minutes on medical discussion.  CC: Diarrhea  HPI:  [name] is a 79 y.o. F with PMHx listed below presenting for Diarrhea. Please see the A&P for the status of the patient's chronic medical problems.  Past Medical History:  Diagnosis Date  . AIHA (autoimmune hemolytic anemia)    12/08 onset  . Atrial fibrillation ([place])    not on coumidin because AIHA and major bleeding complications  . Axillary lymphadenopathy    left negative MMG 1/08  . Back pain, chronic   . Complicated grief [id number]   Death of husband in 2011-01-15   . Diabetes mellitus   . Hyperlipidemia   . Hypertension   . Lupus ([place])    Treated with prednisone indefinitely   . Mitral regurgitation   . Mitral regurgitation 10/24/2006   2d Echo from 01-15-2008 - EF 60%, Moderated MR    . Seasonal allergies    Review of Systems:  Performed and all others negative.  Physical Exam:   There were no vitals filed for this visit. Not performed for tele-health visit  Assessment & Plan:   See Encounters Tab for problem based charting.  Patient discussed with Dr. Rebeca Alert

## 2019-12-23 NOTE — Progress Notes (Signed)
Internal Medicine Clinic Attending  Case discussed with Dr. Melvin at the time of the visit.  We reviewed the resident's history and exam and pertinent patient test results.  I agree with the assessment, diagnosis, and plan of care documented in the resident's note.  Aubrei Bouchie, M.D., Ph.D.  

## 2019-12-26 ENCOUNTER — Other Ambulatory Visit (INDEPENDENT_AMBULATORY_CARE_PROVIDER_SITE_OTHER): Payer: Medicare Other

## 2019-12-26 ENCOUNTER — Telehealth: Payer: Self-pay | Admitting: *Deleted

## 2019-12-26 DIAGNOSIS — D649 Anemia, unspecified: Secondary | ICD-10-CM | POA: Diagnosis not present

## 2019-12-26 LAB — CBC WITH DIFFERENTIAL/PLATELET
Abs Immature Granulocytes: 0.03 10*3/uL (ref 0.00–0.07)
Basophils Absolute: 0 10*3/uL (ref 0.0–0.1)
Basophils Relative: 0 %
Eosinophils Absolute: 0.1 10*3/uL (ref 0.0–0.5)
Eosinophils Relative: 1 %
HCT: 24 % — ABNORMAL LOW (ref 36.0–46.0)
Hemoglobin: 7.2 g/dL — ABNORMAL LOW (ref 12.0–15.0)
Immature Granulocytes: 1 %
Lymphocytes Relative: 24 %
Lymphs Abs: 1.1 10*3/uL (ref 0.7–4.0)
MCH: 28.2 pg (ref 26.0–34.0)
MCHC: 30 g/dL (ref 30.0–36.0)
MCV: 94.1 fL (ref 80.0–100.0)
Monocytes Absolute: 0.6 10*3/uL (ref 0.1–1.0)
Monocytes Relative: 13 %
Neutro Abs: 2.9 10*3/uL (ref 1.7–7.7)
Neutrophils Relative %: 61 %
Platelets: 279 10*3/uL (ref 150–400)
RBC: 2.55 MIL/uL — ABNORMAL LOW (ref 3.87–5.11)
RDW: 15.3 % (ref 11.5–15.5)
WBC: 4.7 10*3/uL (ref 4.0–10.5)
nRBC: 0.6 % — ABNORMAL HIGH (ref 0.0–0.2)

## 2019-12-26 LAB — RETICULOCYTES
Immature Retic Fract: 32.7 % — ABNORMAL HIGH (ref 2.3–15.9)
RBC.: 2.49 MIL/uL — ABNORMAL LOW (ref 3.87–5.11)
Retic Count, Absolute: 69 10*3/uL (ref 19.0–186.0)
Retic Ct Pct: 2.8 % (ref 0.4–3.1)

## 2019-12-26 NOTE — Addendum Note (Signed)
Addended by: Truddie Crumble on: 12/26/2019 11:17 AM   Modules accepted: Orders

## 2019-12-26 NOTE — Telephone Encounter (Signed)
per traceyf. Lab pt HGB this am 7.2 Called dr Heber Wilcox in his office and made him aware

## 2019-12-27 ENCOUNTER — Other Ambulatory Visit: Payer: Self-pay | Admitting: Internal Medicine

## 2019-12-27 ENCOUNTER — Telehealth: Payer: Self-pay | Admitting: Internal Medicine

## 2019-12-27 DIAGNOSIS — D500 Iron deficiency anemia secondary to blood loss (chronic): Secondary | ICD-10-CM

## 2019-12-27 LAB — FOLATE RBC
Folate, Hemolysate: 245 ng/mL
Folate, RBC: 1029 ng/mL (ref >498)
Hematocrit: 23.8 % — ABNORMAL LOW (ref 34.0–46.6)

## 2019-12-27 NOTE — Telephone Encounter (Signed)
Placed call to [name] and Elenore Rota informing them of Simona Huh results I do not have the full results back her hemoglobin has continued to drop she is iron deficient.  She is on a blood thinner for A. Fib. I discussed with her recent GI issues including diarrhea but I do not think that oral replacement will be the best choice especially with how low her hemoglobin as I certainly want to avoid a blood transfusion with her history of hemolytic anemia so therefore I do recommend IV iron.  They are in agreement with this.  They are unsure whether they want repeat referral to Dr. Penelope Coop and GI given their last conversation was that due to her age and comorbidities they did not want to pursue anything I discussed that I understand agree with this.  I will see if we can arrange for a short stay iron infusion of Feraheme given some medication allergies or history of hemolytic anemia will have her premedicated with Solu-Medrol.  They would prefer to use the [place] short stay and for her not to have it on [birth year] which is her birthday.

## 2019-12-28 LAB — PATHOLOGIST SMEAR REVIEW

## 2019-12-30 ENCOUNTER — Other Ambulatory Visit: Payer: Self-pay

## 2019-12-30 DIAGNOSIS — I10 Essential (primary) hypertension: Secondary | ICD-10-CM

## 2019-12-30 MED ORDER — APIXABAN 5 MG PO TABS: 5.0000 mg | ORAL_TABLET | Freq: Two times a day (BID) | ORAL | 11 refills | Status: DC

## 2019-12-30 NOTE — Telephone Encounter (Addendum)
Call made to [facility], but next avail appt not until mid-April, CMA scheduled appt at [facility] for Wed April 7th at 10am (first avail). Spoke with pt's dtr Tara-she is aware of appt time, date, and location.[name]3/29/20213:11 PM

## 2019-12-30 NOTE — Telephone Encounter (Addendum)
Spoke with Stephanie Horne. She is agreeable to the plan of iron infusion on 4/7. Discussed I would ideally like this sooner but Ryleigh is overall feeling about the same (a little fatigued) still no signs of any active bleeding.  I did discuss briefly about eliquis potentially may be contributing to her bleeding however they very much want to continue eliquis which I think is reasonable without signs of major bleeding, I would like to readdress this in more detail at our next visit.  They are about out of eliquis and I will have her pick up 2 week supply of sample medication.  She also did pick up ferrous sulfate tablets, I discussed she may take 1 tablet daily or every other day and monitor for side effects.  I also discussed that if she were to feel increasing fatigue SOB, or appear unwell I would like her to go to the ED for a urgent blood transfusion.

## 2019-12-30 NOTE — Telephone Encounter (Signed)
Requesting a sample on apixaban (ELIQUIS) 5 MG TABS tablet, also requesting a Rx to be sent over to the pharmacy.

## 2019-12-30 NOTE — Telephone Encounter (Addendum)
Samples pulled for distribution-will have attending sign off.  Pt's dtr will pick up tomorrow around 0930.  [name]3/29/20213:49 PM

## 2019-12-30 NOTE — Telephone Encounter (Signed)
2wk supply apixaban approved by pcp and signed out by attending. Samples placed in glass cabinet in sample room.  Pt's dtr will pick up tomorrow around 0930.Marland KitchenMarland KitchenDespina Hidden Cassady3/29/20214:41 PM

## 2019-12-31 ENCOUNTER — Other Ambulatory Visit: Payer: Self-pay | Admitting: *Deleted

## 2019-12-31 LAB — MULTIPLE MYELOMA PANEL, SERUM
Albumin SerPl Elph-Mcnc: 3.5 g/dL (ref 2.9–4.4)
Albumin/Glob SerPl: 1.1 (ref 0.7–1.7)
Alpha 1: 0.3 g/dL (ref 0.0–0.4)
Alpha2 Glob SerPl Elph-Mcnc: 0.6 g/dL (ref 0.4–1.0)
B-Globulin SerPl Elph-Mcnc: 1.1 g/dL (ref 0.7–1.3)
Gamma Glob SerPl Elph-Mcnc: 1.4 g/dL (ref 0.4–1.8)
Globulin, Total: 3.4 g/dL (ref 2.2–3.9)
IgA/Immunoglobulin A, Serum: 424 mg/dL — ABNORMAL HIGH (ref 64–422)
IgG (Immunoglobin G), Serum: 2018 mg/dL — ABNORMAL HIGH (ref 586–1602)
IgM (Immunoglobulin M), Srm: 118 mg/dL (ref 26–217)
Total Protein: 6.9 g/dL (ref 6.0–8.5)

## 2019-12-31 LAB — FERRITIN: Ferritin: 26 ng/mL (ref 15–150)

## 2019-12-31 LAB — COPPER, SERUM: Copper: 139 ug/dL (ref 80–158)

## 2019-12-31 LAB — IRON AND TIBC
Iron Saturation: 5 % — CL (ref 15–55)
Iron: 16 ug/dL — ABNORMAL LOW (ref 27–139)
Total Iron Binding Capacity: 344 ug/dL (ref 250–450)
UIBC: 328 ug/dL (ref 118–369)

## 2019-12-31 LAB — LACTATE DEHYDROGENASE: LDH: 239 IU/L — ABNORMAL HIGH (ref 119–226)

## 2019-12-31 LAB — HAPTOGLOBIN: Haptoglobin: 128 mg/dL (ref 42–346)

## 2019-12-31 MED ORDER — PREDNISONE 5 MG PO TABS: 7.5000 mg | ORAL_TABLET | Freq: Every day | ORAL | 1 refills | Status: DC

## 2020-01-08 ENCOUNTER — Ambulatory Visit (HOSPITAL_COMMUNITY)
Admission: RE | Admit: 2020-01-08 | Discharge: 2020-01-08 | Disposition: A | Discharge status: Home / Self Care | Payer: Medicare Other | Source: Ambulatory Visit | Attending: Internal Medicine | Admitting: Internal Medicine

## 2020-01-08 ENCOUNTER — Other Ambulatory Visit: Payer: Self-pay

## 2020-01-08 ENCOUNTER — Other Ambulatory Visit: Payer: Self-pay | Admitting: *Deleted

## 2020-01-08 DIAGNOSIS — D500 Iron deficiency anemia secondary to blood loss (chronic): Secondary | ICD-10-CM

## 2020-01-08 MED ORDER — METHYLPREDNISOLONE SODIUM SUCC 125 MG IJ SOLR: 125.0000 mg | Freq: Once | INTRAMUSCULAR | Status: AC

## 2020-01-08 MED ORDER — FAMOTIDINE IN NACL 20-0.9 MG/50ML-% IV SOLN
INTRAVENOUS | Status: AC
  Administered 2020-01-08: 20 mg
  Filled 2020-01-08: qty 50

## 2020-01-08 MED ORDER — METHYLPREDNISOLONE SODIUM SUCC 125 MG IJ SOLR
INTRAMUSCULAR | Status: AC
  Administered 2020-01-08: 125 mg via INTRAVENOUS
  Filled 2020-01-08: qty 2

## 2020-01-08 MED ORDER — SODIUM CHLORIDE 0.9 % IV SOLN
10.0000 mg | Freq: Once | INTRAVENOUS | Status: DC
  Filled 2020-01-08: qty 1

## 2020-01-08 MED ORDER — SODIUM CHLORIDE 0.9 % IV SOLN
510.0000 mg | Freq: Once | INTRAVENOUS | Status: AC
  Administered 2020-01-08: 510 mg via INTRAVENOUS
  Filled 2020-01-08: qty 17

## 2020-01-08 NOTE — Patient Outreach (Signed)
[place] [facility]) Care Management  01/08/2020  Stephanie Horne 1941/05/20 [id number]   Quarterly Follow up outreach  Stephanie Horne was referred to [facility] in August 2020 from [name], pharmacy for medication cost help with Eliquis patient assistance program The medication concerns have been addressed in December 2020 by [place] staff  Insurance united healthcare medicare   Consent: [facility] RN CM reviewed [facility] services with patientpreviously. Patient gave verbal consent for servicesfor [facility] telephonic RN CMto speak with her daughter Stephanie Horne Pt with memory concerns She informed [facility] RN CM that RN CM could call her daughter Taraduring all previous calls  Last outreach on 10/09/19 Agreed follow up Outreach to Stephanie Horne, daughter No answer. [facility] RN CM left HIPAA [facility] Portability and Accountability Act) compliant voicemail message along with CM's contact info.  Encouraged a return call for the quarterly outreach for Stephanie Levick   Plan: [facility] RN CM scheduled this patient for another call attempt within 4 business days Routed note to MD   [place]. [name], RN, BSN, [place] Coordinator Office number [phone] Mobile number [phone]  Main THN number [phone] Fax number [phone]

## 2020-01-08 NOTE — Discharge Instructions (Signed)

## 2020-01-10 ENCOUNTER — Encounter: Payer: Self-pay | Admitting: *Deleted

## 2020-01-10 ENCOUNTER — Other Ambulatory Visit: Payer: Self-pay | Admitting: *Deleted

## 2020-01-10 NOTE — Patient Outreach (Addendum)
[place] [facility]) Care Management  01/10/2020  MCKENZY BECERRIL 07-31-1941 [id number]   Second unsuccessful Quarterly Follow up outreach attempt  Stephanie Horne was referred to [facility] in August 2020 from [name], pharmacy for medication cost help with Eliquis patient assistance program The medication concerns have been addressed in December 2020 by [place] staff  Insuranceunited healthcare medicare  Consent: [facility] RN CM reviewed [facility] services with patientpreviously. Patient gave verbal consent for servicesfor [facility] telephonic RN CMto speak with her [name] Pt with memory concerns She informed [facility] RN CM that RN CM could call her daughter Taraduring all previous calls  Last outreach on 10/09/19 Agreed follow up Outreach to [name], daughter Today [facility] RN CM called the listed home number for the patient [phone] No answer. [facility] RN CM left HIPAA [facility] Portability and Accountability Act) compliant voicemail message along with CM's contact info.  Encouraged a return call for the quarterly outreach for Stephanie Bostic   Plan: [facility] RN CM sent this [facility] engaged patient an [facility] unsuccessful outreach letter and scheduled this patient for a final call attempt within 4 business days Routed note to MD   Joelene Millin L. [name], RN, BSN, [place] Coordinator Office number [phone] Mobile number [phone]  Main THN number [phone] Fax number [phone]

## 2020-01-15 ENCOUNTER — Other Ambulatory Visit: Payer: Self-pay | Admitting: *Deleted

## 2020-01-15 NOTE — Patient Outreach (Signed)
[facility]) Care Management  01/15/2020  Stephanie Horne 08/17/1941 [id number]   QuarterlyFollow upoutreach attempt  Stephanie Horne referred to [facility] in August 2020 from [name], pharmacy for medication cost help with Eliquis patient assistance program The medication concerns have been addressed in December 2020 by [employer] staff  Insuranceunited healthcare medicare  Consent: [facility] RN CM reviewed [facility] services with patientpreviously. Patient gave verbal consent for servicesfor [facility] telephonic RN CMto speak with her daughter Stephanie Horne Pt with memory concerns She informed [facility] RN CM that RN CM could call her daughter Taraduring all previous calls   Last unsuccessful outreach on 14/9/21 Last successful outreach was on 10/09/19 Daughter, Stephanie Horne returned a call to [place] and left a voice message after receiving the [facility] unsuccessful letter sent on 01/10/20 Stephanie Horne indicated she is aware that Stephanie Horne has to "reach $350 of pay out with her medicine before she can get it" (related to Eliquis) Stephanie Horne was assisted by [place] staff in December 2020 after she received a 3 month supply of Eliquis from [employer] patient assistance program. She was made aware that she would have to re apply in 2021 and to be approved the company would require Stephanie Horne to spend at least 3% of the household income on medication expenses    [facility] RN CM returned a call to Stephanie Horne and Stephanie Horne but Stephanie Horne is trying to get pt in the car to go complete and errand and requests to return a call to [name] is able to verify HIPAA, DOB and Address  Plans [facility] RN CM will await a call from [name] Daughter and pend patient/daughter for another outreach within the next 4-7 business days   [employer]. [name], RN, BSN, [place] Coordinator Office number [phone] Mobile number [phone]  Main THN number [phone] Fax number [phone]

## 2020-01-16 ENCOUNTER — Other Ambulatory Visit: Payer: Self-pay

## 2020-01-16 ENCOUNTER — Other Ambulatory Visit: Payer: Self-pay | Admitting: *Deleted

## 2020-01-16 ENCOUNTER — Encounter: Payer: Self-pay | Admitting: *Deleted

## 2020-01-16 NOTE — Patient Outreach (Signed)
[facility]) Care Management  01/16/2020  DARICA GOREN [birth year] [id number]    Quarterly Follow up outreach   Mrs Bandel was referred to [facility] in August 2020 from [name], pharmacy for medication cost help with Eliquis patient assistance program The medication concerns have been addressed in December 2020 by [place] staff   Insurance united healthcare medicare    Consent: [facility] RN CM reviewed [facility] services with patient previously. Patient gave verbal consent for services for [facility] telephonic RN CM to speak with her [name] Pt with memory concerns  She informed [facility] RN CM that RN CM could call her [name] during all previous calls  Successful outreach to [name], Daughter  [name]  is able to verify HIPAA ([place] and Accountability Act) identifiers, date of birth (DOB) and address Discussed [place] follow up outreach   Pharmacy/Eliquis She did receive [place] assistance for Eliquis in 2020 and got a 3 month supply in December 2020. [name] voices awareness that with Mrs Masri insurance coverage she has to spend 3% of her income on medications to qualify for this medication assistance again. Eliquis continues to get them in the donut hole mid year They will need a new [place] application for Eliquis tar reports Mrs Wirick has thrown away important mail recently and it is best to send all important items directly to [name]. [name] confirmed her address as [address] [place] [address]. THN RN CM discussed possibly considering speaking with [place] SHIIP program for review of medications to check for a possible coverage to include Eliquis but [name] confirms Mrs Henault is very cautious with change., "has a fear of change"  Diarrhea Has been resolved after primary care provider (PCP) visit to review and change medications. [name] reports also a change in diet to include no fried foods  Home health services -had [facility] services briefly "two  Days"  but "Did not work out" No longer with home health services. Mrs Pickeral retired son stayed for about a month in the last 3 months  Personal care service resources was provided previously by [facility] but [name] reports the material was read and discarded by Mrs Chandria Rookstool request resources be resent to her address. [name] is aware that Mrs Saperstein does not qualify for Medicaid (makes $16 over limit) with assist of her niece who works at [employer] ([place]) . [name] is aware that without medicaid Personal care services [facility]) will be and out of pocket expense. [name] report personal care services will need to be considered again related to dementia progression and the changes in [place] reports they will have to go back to office visits to providers as Mrs Buczek is noted to not be doing well with tele conference calls/video chatas she is voicing concern with "using the internet to share her personal information"    Nutrition  Mrs Hartung appetite remains poor [name] reports a present weight of 122 lbs It is noted Mrs Peppers has been 135 lbs in the last yr (a loss of 13 lbs) She has poor dietary intake and presently [name] reports having to sit and monitor her at meals 'to make sure she eats" [name] offers boost twice a day as the food intake varies but at times the boosts are found hidden in her room.    Social: Mrs Ke is a 79 year old retired female who reports she lives alone but with family who check on  her frequently She states good support from her children 4 living children (one deceased) and grandchildren. She reports being independent/assist with ADLs but need assist with iADLs, She reports she can still clean her house. She reports assistance from her family for transportation prn.  She has a granddaughter that works at [employer] ([place])  She has dementia which is more prominent in the evening "sundowner" She has poor dietary intake and  presently [name] reports having to sit and monitor her at meals 'to make sure she eats" [name] offers boost twice a day as the food intake varies but at times the boosts are found hidden in her room.  [name] the daughter is the primary caregiver but [name] has medical concerns of her own, Lupus and Rheumatoid Arthritis (RA) [name]'s Lupus has advanced per [name]  Conditions: HTN, atrial fibrillation, pulmonary HTN, severe mitral regurgitation, CAD, diastolic CHF, hearing loss, HOH, alzheimer's dementia without behavioral disturbance, low back pain, vitamin D deficiency, Insomnia, hx of autoimmune hemolytic anemia, osteoporosis of forearm without pathological fx allergic rhinitis., hx of falls at home, HLD, DM   DME: cbg monitor   Plans [facility] RN CM will follow up with Mrs Tench and her [name] within the next 28-35 business days Pt encouraged to return a call to [facility] RN CM prn Routed note to MD  [place] Problem One     Most Recent Value  Care Plan Problem One  pharmacy needs  Role Documenting the Problem One  Care Management Telephonic Coordinator  Care Plan for Problem One  Active  [facility] Long Term Goal   Patient will receive [facility] pharmacy services  [facility] Long Term Goal Start Date  09/11/19  THN Long Term Goal Met Date  09/11/19  THN CM Short Term Goal #1   THn phramacy services to be offered as evidence by verbalization during contact with patient/daughter  [facility] CM Short Term Goal #1 Start Date  09/11/19  [facility] CM Short Term Goal #1 Met Date  09/11/19  THN CM Short Term Goal #2   over the next 45 days patient will receive Eliquis patient assistance application to complete as evidence by verbalization during outreach  [facility] CM Short Term Goal #2 Start Date  01/16/20  Interventions for Short Term Goal #2  discussed Eliquis patient assistance status, sent bristol myers eliquis application via mail     [facility] CM Care Plan Problem Two     Most Recent Value  Care Plan Problem Two  home care services  request  Role Documenting the Problem Two  Care Management Telephonic Coordinator  Care Plan for Problem Two  Active  THN Long Term Goal  Patient will be evaluated by primary care MD for necessity of home health services and orders provided prn as evidence by verbalization or orders noted in EPIC over the next 90 days  THN Long Term Goal Start Date  09/11/19  [facility] Long Term Goal Met Date  01/16/20  THN CM Short Term Goal #1   over the next 60 days patient will have contact from MD office about increased diarrhea and home health services  [facility] CM Short Term Goal #1 Start Date  10/09/19  [facility] CM Short Term Goal #1 Met Date   01/16/20  THN CM Short Term Goal #2   over the next 45 days patient/daughter will receive a list of personal care services as evidence by verbalization during outreach   [facility] CM Short Term Goal #2  Start Date  01/16/20  Interventions for Short Term Goal #2  Discussed status of home carre, answered questions, sent list of  [place] personal care services     [place]. [name], RN, BSN, [place] Coordinator Office number [phone] Mobile number [phone]  Main THN number [phone] Fax number [phone]

## 2020-01-17 ENCOUNTER — Other Ambulatory Visit: Payer: Self-pay | Admitting: Internal Medicine

## 2020-01-17 DIAGNOSIS — D591 Autoimmune hemolytic anemia, unspecified: Secondary | ICD-10-CM

## 2020-01-17 DIAGNOSIS — D509 Iron deficiency anemia, unspecified: Secondary | ICD-10-CM

## 2020-01-17 NOTE — Progress Notes (Signed)
Received Feraheme dose 1 on 01/08/20.  Needs second dose.  Will place order.  Will need another short stay visit for second dose.  Stephanie Horne reports her mother did very well with the injection+ pretreatment meds, seems to be having more energy now!

## 2020-01-21 NOTE — Progress Notes (Signed)
Called / talked to [name], pt's daughter - prefers Tues or Wed / @ 1000 or 1100 AM. Talked to [place], short stay - Feraheme appt scheduled Wed 4/28 @ 1100 AM at [facility]. Appt date / time left on Tara's vm per her request; also to register @ [facility] prior to pt's appt.

## 2020-01-28 ENCOUNTER — Other Ambulatory Visit: Payer: Self-pay | Admitting: *Deleted

## 2020-01-28 DIAGNOSIS — I5032 Chronic diastolic (congestive) heart failure: Secondary | ICD-10-CM

## 2020-01-28 DIAGNOSIS — I38 Endocarditis, valve unspecified: Secondary | ICD-10-CM

## 2020-01-28 MED ORDER — POTASSIUM CHLORIDE CRYS ER 20 MEQ PO TBCR: 20.0000 meq | EXTENDED_RELEASE_TABLET | Freq: Every day | ORAL | 1 refills | Status: DC

## 2020-01-28 NOTE — Telephone Encounter (Signed)
Next appt scheduled 6/3 with PCP. 

## 2020-01-29 ENCOUNTER — Inpatient Hospital Stay (HOSPITAL_COMMUNITY): Admission: RE | Admit: 2020-01-29 | Payer: Medicare Other | Source: Ambulatory Visit

## 2020-01-30 ENCOUNTER — Telehealth: Payer: Self-pay | Admitting: *Deleted

## 2020-01-30 NOTE — Patient Outreach (Signed)
[facility]) Care Management  01/30/2020  Stephanie Horne 1941/04/13 [id number]   CSW noted that patient's daughter, Stephanie Horne requested resources for in-home assistance be mailed to them again as she reports the material that was previously mailed to them was read and discarded by Stephanie Horne. Stephanie Horne is aware that Stephanie Horne does not qualify for Medicaid (makes $16 over limit) with assist of her niece who works at [employer] ([place]) . Stephanie Horne is aware that without medicaid Personal care services [facility]) will be and out of pocket expense. Stephanie Horne report personal care services will need to be considered again related to dementia progression and the changes in Stephanie Horne's health. CSW will have resources mailed to patient/daughter.    Raynaldo Opitz, LCSW [facility]  Clinical Social Worker cell #: [phone]

## 2020-02-03 ENCOUNTER — Other Ambulatory Visit: Payer: Self-pay

## 2020-02-03 ENCOUNTER — Ambulatory Visit (HOSPITAL_COMMUNITY)
Admission: RE | Admit: 2020-02-03 | Discharge: 2020-02-03 | Disposition: A | Discharge status: Home / Self Care | Payer: Medicare Other | Source: Ambulatory Visit | Attending: Internal Medicine | Admitting: Internal Medicine

## 2020-02-03 DIAGNOSIS — D591 Autoimmune hemolytic anemia, unspecified: Secondary | ICD-10-CM | POA: Diagnosis not present

## 2020-02-03 DIAGNOSIS — D509 Iron deficiency anemia, unspecified: Secondary | ICD-10-CM | POA: Diagnosis not present

## 2020-02-03 MED ORDER — SODIUM CHLORIDE 0.9 % IV SOLN
510.0000 mg | Freq: Once | INTRAVENOUS | Status: AC
  Administered 2020-02-03: 510 mg via INTRAVENOUS
  Filled 2020-02-03: qty 17

## 2020-02-03 MED ORDER — FAMOTIDINE IN NACL 20-0.9 MG/50ML-% IV SOLN: 20.0000 mg | Freq: Once | INTRAVENOUS | Status: DC

## 2020-02-03 MED ORDER — METHYLPREDNISOLONE SODIUM SUCC 125 MG IJ SOLR: 125.0000 mg | Freq: Once | INTRAMUSCULAR | Status: DC

## 2020-02-03 MED ORDER — METHYLPREDNISOLONE SODIUM SUCC 125 MG IJ SOLR
INTRAMUSCULAR | Status: AC
  Administered 2020-02-03: 125 mg
  Filled 2020-02-03: qty 2

## 2020-02-03 MED ORDER — FAMOTIDINE IN NACL 20-0.9 MG/50ML-% IV SOLN
INTRAVENOUS | Status: AC
  Administered 2020-02-03: 20 mg
  Filled 2020-02-03: qty 50

## 2020-02-04 ENCOUNTER — Other Ambulatory Visit: Payer: Self-pay

## 2020-02-04 ENCOUNTER — Other Ambulatory Visit: Payer: Self-pay | Admitting: *Deleted

## 2020-02-04 MED ORDER — PREDNISONE 5 MG PO TABS: 7.5000 mg | ORAL_TABLET | Freq: Every day | ORAL | 1 refills | Status: DC

## 2020-02-04 NOTE — Telephone Encounter (Signed)
predniSONE (DELTASONE) 5 MG tablet   , REFILL REQUEST @  [place], [place] [phone] (Phone) [phone] (Fax)

## 2020-02-04 NOTE — Patient Outreach (Addendum)
[facility]) Care Management  02/04/2020  KYANA AICHER [birth year] [id number]   [place] coordination- Boost coupons   [facility] RN CM collaborated with [facility] CMA ,Orlando Penner to have boost coupons sent to Mrs Gianino in c/o her [name]  Plans [facility] RN CM will follow up with Mrs Keeler and her [name] within the next 28-35 business days  [facility] CM Care Plan Problem One     Most Recent Value  Care Plan Problem One  pharmacy needs  Role Documenting the Problem One  Care Management Telephonic Coordinator  Care Plan for Problem One  Active  [facility] Long Term Goal   Patient will receive [facility] pharmacy services  [facility] Long Term Goal Start Date  09/11/19  THN Long Term Goal Met Date  09/11/19  THN CM Short Term Goal #1   THn phramacy services to be offered as evidence by verbalization during contact with patient/daughter  [facility] CM Short Term Goal #1 Start Date  09/11/19  [facility] CM Short Term Goal #1 Met Date  09/11/19  THN CM Short Term Goal #2   over the next 45 days patient will receive Eliquis patient assistance application to complete as evidence by verbalization during outreach  [facility] CM Short Term Goal #2 Start Date  01/16/20  Interventions for Short Term Goal #2  not discussed on this interaction     [facility] CM Care Plan Problem Two     Most Recent Value  Care Plan Problem Two  home care services request  Role Documenting the Problem Two  Care Management Telephonic [place] for Problem Two  Active  THN Long Term Goal  Patient will be evaluated by primary care MD for necessity of home health services and orders provided prn as evidence by verbalization or orders noted in EPIC over the next 90 days  THN Long Term Goal Start Date  09/11/19  [facility] Long Term Goal Met Date  01/16/20  THN CM Short Term Goal #1   over the next 60 days patient will have contact from MD office about increased diarrhea and home health services  [facility] CM Short Term Goal #1 Start Date  10/09/19  [facility] CM  Short Term Goal #1 Met Date   01/16/20  THN CM Short Term Goal #2   over the next 45 days patient/daughter will receive a list of personal care services as evidence by verbalization during outreach   [facility] CM Short Term Goal #2 Start Date  01/16/20  Interventions for Short Term Goal #2  Discussed status of home carre, answered questions, sent list of  [place] personal care services  [facility] CM Short Term Goal #3   over the next 30 days patient/daughter will receive nutritional supplement resources/coupons as verbalized during outreach   [facility] CM Short Term Goal #3 Start Date  01/16/20  Interventions for Short Term Goal #3  collaborated with [facility] CMA to have boost coupons sent in care of her daughter       [place]. [name], RN, BSN, [place] Coordinator Office number [phone] Mobile number [phone]  Main THN number [phone] Fax number [phone]

## 2020-02-13 ENCOUNTER — Other Ambulatory Visit: Payer: Self-pay | Admitting: *Deleted

## 2020-02-13 NOTE — Patient Outreach (Signed)
[facility]) Care Management  02/13/2020  SAMARIS MOZQUEDA 1941-02-06 [id number]   THN Follow upoutreach for coupons and [facility] SW services   Mrs Bolds referred to [facility] in August 2020 from [name], pharmacy for medication cost help with Eliquis patient assistance program The medication concerns have been addressed in December 2020 by [place] staff  Insuranceunited healthcare medicare  Consent: [facility] RN CM reviewed [facility] services with patientpreviously. Patient gave verbal consent for servicesfor [facility] telephonic RN CMto speak with her [name] Pt with memory concerns She informed [facility] RN CM that RN CM could call her daughter Taraduring all previous calls  Successful outreach to [name], Daughter  [name]  is unable to speak at this time as she is in a store  She reports she will return a call to [place] RN CM notes [facility] SW re mailed resources on 01/30/20   Plans Pending a return call from Daughter, [name] [facility] RN CM will follow up within the next 7-14 business days   Kiylah Loyer L. [name], RN, BSN, [place] Coordinator Office number [phone] Mobile number [phone]  Main THN number [phone] Fax number [phone]

## 2020-02-25 ENCOUNTER — Other Ambulatory Visit: Payer: Self-pay | Admitting: *Deleted

## 2020-02-25 NOTE — Patient Outreach (Signed)
[facility]) Care Management  02/25/2020  DAWNA MARTINE 10-Aug-1941 [id number]   THN Follow upoutreach for coupons and [facility] SW services   Mrs Rogillio referred to [facility] in August 2020 from [name], pharmacy for medication cost help with Eliquis patient assistance program The medication concerns have been addressed in December 2020 by [place] staff  Insuranceunited healthcare medicare  Consent: [facility] RN CM reviewed [facility] services with patientpreviously. Patient gave verbal consent for servicesfor [facility] telephonic RN CMto speak with her [name] Pt with memory concerns She informed [facility] RN CM that RN CM could call her daughter Taraduring all previous calls unsuccessful outreach to [name], Daughter  Last attempt to reach [place] 02/13/20 unsuccessful as she was not unable to speak as she is in a store    Plans  [facility] [place] RN CM will follow up within the next 30-35  business days   Emaleigh Guimond L. [name], RN, BSN, [place] Coordinator Office number [phone] Mobile number [phone]  Main THN number [phone] Fax number [phone]

## 2020-02-26 MED ORDER — DONEPEZIL HCL 5 MG PO TABS: 5.0000 mg | ORAL_TABLET | Freq: Every day | ORAL | 1 refills | Status: DC

## 2020-03-05 ENCOUNTER — Ambulatory Visit (INDEPENDENT_AMBULATORY_CARE_PROVIDER_SITE_OTHER): Payer: Medicare Other | Admitting: Internal Medicine

## 2020-03-05 ENCOUNTER — Encounter: Payer: Self-pay | Admitting: Internal Medicine

## 2020-03-05 ENCOUNTER — Other Ambulatory Visit: Payer: Self-pay

## 2020-03-05 VITALS — BP 126/78 | HR 76 | Temp 98.1°F | Ht 70.0 in | Wt 123.4 lb

## 2020-03-05 DIAGNOSIS — F0280 Dementia in other diseases classified elsewhere without behavioral disturbance: Secondary | ICD-10-CM

## 2020-03-05 DIAGNOSIS — R634 Abnormal weight loss: Secondary | ICD-10-CM

## 2020-03-05 DIAGNOSIS — D509 Iron deficiency anemia, unspecified: Secondary | ICD-10-CM

## 2020-03-05 DIAGNOSIS — I10 Essential (primary) hypertension: Secondary | ICD-10-CM | POA: Diagnosis not present

## 2020-03-05 DIAGNOSIS — L97319 Non-pressure chronic ulcer of right ankle with unspecified severity: Secondary | ICD-10-CM

## 2020-03-05 DIAGNOSIS — E119 Type 2 diabetes mellitus without complications: Secondary | ICD-10-CM

## 2020-03-05 DIAGNOSIS — I4821 Permanent atrial fibrillation: Secondary | ICD-10-CM

## 2020-03-05 DIAGNOSIS — I83013 Varicose veins of right lower extremity with ulcer of ankle: Secondary | ICD-10-CM

## 2020-03-05 DIAGNOSIS — G301 Alzheimer's disease with late onset: Secondary | ICD-10-CM

## 2020-03-05 LAB — POCT GLYCOSYLATED HEMOGLOBIN (HGB A1C): Hemoglobin A1C: 6.4 % — AB (ref 4.0–5.6)

## 2020-03-05 LAB — GLUCOSE, CAPILLARY: Glucose-Capillary: 162 mg/dL — ABNORMAL HIGH (ref 70–99)

## 2020-03-05 NOTE — Patient Instructions (Signed)
I will send you over the the GI doctor to discuss potential options for evaluating the weight loss.  I will call with the labs.

## 2020-03-06 LAB — CMP14 + ANION GAP
ALT: 13 IU/L (ref 0–32)
AST: 19 IU/L (ref 0–40)
Albumin/Globulin Ratio: 1.6 (ref 1.2–2.2)
Albumin: 4.5 g/dL (ref 3.6–4.6)
Alkaline Phosphatase: 89 IU/L (ref 48–121)
Anion Gap: 17 mmol/L (ref 10.0–18.0)
BUN/Creatinine Ratio: 16 (ref 12–28)
BUN: 13 mg/dL (ref 8–27)
Bilirubin Total: 0.4 mg/dL (ref 0.0–1.2)
CO2: 24 mmol/L (ref 20–29)
Calcium: 10.1 mg/dL (ref 8.7–10.3)
Chloride: 101 mmol/L (ref 96–106)
Creatinine, Ser: 0.8 mg/dL (ref 0.57–1.00)
GFR calc Af Amer: 80 mL/min/{1.73_m2} (ref >59)
GFR calc non Af Amer: 69 mL/min/{1.73_m2} (ref >59)
Globulin, Total: 2.8 g/dL (ref 1.5–4.5)
Glucose: 182 mg/dL — ABNORMAL HIGH (ref 65–99)
Potassium: 4.5 mmol/L (ref 3.5–5.2)
Sodium: 142 mmol/L (ref 134–144)
Total Protein: 7.3 g/dL (ref 6.0–8.5)

## 2020-03-06 LAB — CBC WITH DIFFERENTIAL/PLATELET
Basophils Absolute: 0 10*3/uL (ref 0.0–0.2)
Basos: 0 %
EOS (ABSOLUTE): 0 10*3/uL (ref 0.0–0.4)
Eos: 1 %
Hematocrit: 33.4 % — ABNORMAL LOW (ref 34.0–46.6)
Hemoglobin: 10.6 g/dL — ABNORMAL LOW (ref 11.1–15.9)
Immature Grans (Abs): 0 10*3/uL (ref 0.0–0.1)
Immature Granulocytes: 1 %
Lymphocytes Absolute: 1.3 10*3/uL (ref 0.7–3.1)
Lymphs: 29 %
MCH: 29.4 pg (ref 26.6–33.0)
MCHC: 31.7 g/dL (ref 31.5–35.7)
MCV: 93 fL (ref 79–97)
Monocytes Absolute: 0.4 10*3/uL (ref 0.1–0.9)
Monocytes: 9 %
Neutrophils Absolute: 2.8 10*3/uL (ref 1.4–7.0)
Neutrophils: 60 %
Platelets: 172 10*3/uL (ref 150–450)
RBC: 3.6 x10E6/uL — ABNORMAL LOW (ref 3.77–5.28)
RDW: 18 % — ABNORMAL HIGH (ref 11.7–15.4)
WBC: 4.6 10*3/uL (ref 3.4–10.8)

## 2020-03-06 LAB — IRON AND TIBC
Iron Saturation: 41 % (ref 15–55)
Iron: 109 ug/dL (ref 27–139)
Total Iron Binding Capacity: 264 ug/dL (ref 250–450)
UIBC: 155 ug/dL (ref 118–369)

## 2020-03-06 LAB — FERRITIN: Ferritin: 288 ng/mL — ABNORMAL HIGH (ref 15–150)

## 2020-03-09 DIAGNOSIS — D509 Iron deficiency anemia, unspecified: Secondary | ICD-10-CM | POA: Insufficient documentation

## 2020-03-09 NOTE — Assessment & Plan Note (Signed)
HPI: Remains on Aricept, overall doing well, does have chronic rhinorrhea which is tolerable.  Assessment Alzheimer's dementia  Plan Continue Aricept 5 mg daily

## 2020-03-09 NOTE — Assessment & Plan Note (Signed)
HPI: While she has a history of autoimmune hemolytic anemia for which she takes chronic prednisone therapy.  Her previous work-up for her weight loss revealed iron deficiency anemia.  I had her scheduled for 2 injections of 510 mg of Feraheme which she completed at short stay.  This improved her energy greatly.  In addition she has continued to take ferrous sulfate 325 mg every other day.  Assessment iron deficiency anemia-greatly improved  Plan Recheck her iron studies shows that her iron stores are now replete, hemoglobin has greatly improved to near normal.  She may have a component of anemia of chronic disease at this point.  For now we will continue some supplemental oral iron intake every other day is fine we could decrease this a little bit further if needed. -GI referral as noted under my weight loss assessment

## 2020-03-09 NOTE — Assessment & Plan Note (Signed)
HPI: Denies any issues with blood pressure medications no orthostasis.  Assessment essential hypertension well-controlled  Plan Continue metoprolol 50 mg twice daily

## 2020-03-09 NOTE — Progress Notes (Signed)
  Subjective:  HPI: [name] is a 79 y.o. female who presents for follow up iron deficiency anemia, weight loss.  Please see Assessment and Plan below for the status of her chronic medical problems.  Objective:  Physical Exam: Vitals:   03/05/20 1059  BP: 126/78  Pulse: 76  Temp: 98.1 F (36.7 C)  TempSrc: Oral  SpO2: 95%  Weight: 123 lb 6.4 oz (56 kg)  Height: 5\' 10"  (1.778 m)   Body mass index is 17.71 kg/m. Physical Exam Constitutional:      Appearance: Normal appearance.  Eyes:     Conjunctiva/sclera: Conjunctivae normal.  Cardiovascular:     Rate and Rhythm: Normal rate and regular rhythm.     Heart sounds: Murmur present.  Pulmonary:     Effort: Pulmonary effort is normal.     Breath sounds: Normal breath sounds.  Abdominal:     General: Abdomen is flat.     Palpations: Abdomen is soft.  Skin:    Coloration: Skin is not jaundiced or pale.     Comments: Right lateral malleolar ulcer, scabbed over, decreased in size ~1cm.  Neurological:     Mental Status: She is alert. Mental status is at baseline.  Psychiatric:        Mood and Affect: Mood normal.    Assessment & Plan:  See Encounters Tab for problem based charting.  Medications Ordered No orders of the defined types were placed in this encounter.  Other Orders Orders Placed This Encounter  Procedures  . CBC with Diff  . CMP14 + Anion Gap  . Ferritin  . Iron and IBC ([id number])  . Glucose, capillary  . Ambulatory referral to Gastroenterology    Referral Priority:   Routine    Referral Type:   Consultation    Referral Reason:   Specialty Services Required    Number of Visits Requested:   1  . POC Hbg A1C   Follow Up: Return in about 3 months (around 06/05/2020).

## 2020-03-09 NOTE — Assessment & Plan Note (Signed)
HPI: Has permanent A. fib follows yearly with Dr. Burt Knack given her severe mitral regurgitation.  Has not noticed active blood loss however I recently found iron deficiency anemia on her labs.  We elected to continue anticoagulation and supplement iron.  Assessment permanent atrial fibrillation  Plan Continue Eliquis Continue metoprolol

## 2020-03-09 NOTE — Assessment & Plan Note (Signed)
Wt Readings from Last 5 Encounters:  03/05/20 123 lb 6.4 oz (56 kg)  01/16/20 122 lb (55.3 kg)  01/08/20 123 lb (55.8 kg)  11/28/19 123 lb 3.2 oz (55.9 kg)  10/09/19 135 lb (61.2 kg)   HPI: She remains underweight with a BMI of 17.7, however her weight loss appears to have now plateaued.  Are previous work-up was mainly revealing for iron deficiency anemia.  I had previously discussed with her daughter Stephanie Horne that in general weight loss and iron deficiency anemia at this age would potentially warrant further GI evaluation.  They understandably are concerned about a repeat endoscopy and likely do not want to pursue that.  However potentially I think an evaluation by the gastroenterologist and potentially imaging may be helpful in clarifying if she does have a GI reason for her weight loss and iron deficiency.  This may have some prognostic value even if we do not intervene on it.  Stephanie Horne was agreeable to meet with the gastroenterologist previously Stephanie Horne has been seen Dr. Penelope Coop at [place].  Assessment weight loss, underweight with BMI of 17.7, iron deficiency anemia  Plan GI referral

## 2020-03-09 NOTE — Assessment & Plan Note (Signed)
HPI: Diarrhea has much improved off Metformin.  No hyper or hypoglycemia.  No polyuria or polydipsia.  Assessment controlled type 2 diabetes mellitus with hypertension  Plan Continue to monitor

## 2020-03-09 NOTE — Assessment & Plan Note (Signed)
HPI: Has been using Vaseline as previously discussed with her.  Reports wound is healing up.  Assessment venous ulcer of right ankle almost completely healed  Plan Discussed has a good scab formation does not need Vaseline anymore, continue leg elevation and control of lower extremity edema.

## 2020-03-16 ENCOUNTER — Other Ambulatory Visit: Payer: Self-pay | Admitting: *Deleted

## 2020-03-16 DIAGNOSIS — I10 Essential (primary) hypertension: Secondary | ICD-10-CM

## 2020-03-16 MED ORDER — OMEPRAZOLE 20 MG PO CPDR: 20.0000 mg | DELAYED_RELEASE_CAPSULE | Freq: Every day | ORAL | 1 refills | Status: DC

## 2020-03-16 MED ORDER — FUROSEMIDE 40 MG PO TABS: ORAL_TABLET | ORAL | 1 refills | Status: DC

## 2020-03-27 ENCOUNTER — Other Ambulatory Visit: Payer: Self-pay | Admitting: *Deleted

## 2020-03-27 NOTE — Patient Outreach (Signed)
[facility]) Care Management  03/27/2020  Stephanie Horne 06-03-1941 [id number]   Unsuccessful THNFollow upoutreachfor coupons and [facility] SW services  [name] referred to [facility] in August 2020 from Stephanie Horne, pharmacy for medication cost help with Eliquis patient assistance program The medication concerns have been addressed in December 2020 by [place] staff  Insuranceunited healthcare medicare  Consent: [facility] RN CM reviewed [facility] services with patientpreviously. Patient gave verbal consent for servicesfor [facility] telephonic RN CMto speak with her daughter Stephanie Horne Pt with memory concerns She informed [facility] RN CM that RN CM could call her daughter [name], Daughter    Last attempt to reach [place] 02/13/20   unsuccessful as she was not unableto speak as she is in a store  A call attempt was made to Stephanie Horne with a voice message left on 02/25/20   Plans [facility] RN CM will send an engaged [facility] patient unsuccessful outreach letter [facility] RN CM will follow up within the next 14-21  business days   Clinton Dragone L. [name], RN, BSN, [place] Coordinator Office number [phone] Mobile number [phone]  Main THN number [phone] Fax number [phone]

## 2020-04-01 ENCOUNTER — Telehealth: Payer: Self-pay

## 2020-04-01 NOTE — Telephone Encounter (Signed)
Received TC from patient's daughter, Stephanie Horne.  She states her mother was seen by PCP on 6/3 for "weight loss, diarrhea and mucous in her throat".  Stephanie Horne states her mother has an appointment with [facility] on 7/14, but she has read bad reviews online for [facility].  This RN suggested that they keep the appointment with Sadie [facility]er and determine if they like [facility] after patient see's [facility].  Informed patient's daughter if they decided patient was not a good fit with [facility], they could always call [facility] back for a referral to a different GI office.  Pt's daughter verbalized understanding and was in agreement.  Stephanie Horne is also requesting PCP call her back before GI appointment as she would like to speak with him again.  Will forward to PCP. SChaplin, RN,BSN

## 2020-04-03 NOTE — Telephone Encounter (Signed)
Stephanie Horne a call, she remains concerned about her mother mainly not eating.  I discussed that I am concerned that she is declining due to her multiple chronic illness and moderate to advanced alzhemier's dementia.  I did encourage her to at least see GI and I reassured that [facility] does have excellent physicans.  I encouraged her to allow [place] to eat items that she likes and to not restrict her to a diabetic diet, she should continue to supplement her feeding with ensure or boost.  She can continue to use imodium (reviewed dosing instructions with her) or pepto bismol for diarrhea.

## 2020-04-08 ENCOUNTER — Ambulatory Visit: Payer: Self-pay | Admitting: *Deleted

## 2020-04-20 ENCOUNTER — Other Ambulatory Visit: Payer: Self-pay | Admitting: *Deleted

## 2020-04-20 DIAGNOSIS — I10 Essential (primary) hypertension: Secondary | ICD-10-CM

## 2020-04-20 MED ORDER — METOPROLOL TARTRATE 50 MG PO TABS: ORAL_TABLET | ORAL | 1 refills | Status: DC

## 2020-04-20 MED ORDER — PRAVASTATIN SODIUM 40 MG PO TABS: 40.0000 mg | ORAL_TABLET | Freq: Every day | ORAL | 3 refills | Status: DC

## 2020-04-24 ENCOUNTER — Ambulatory Visit: Payer: Self-pay | Admitting: *Deleted

## 2020-04-29 ENCOUNTER — Ambulatory Visit: Payer: Self-pay | Admitting: *Deleted

## 2020-05-04 ENCOUNTER — Telehealth: Payer: Self-pay

## 2020-05-04 NOTE — Telephone Encounter (Signed)
RTC, VM obtained and message left to call back. °SChaplin, RN,BSN ° °

## 2020-05-04 NOTE — Telephone Encounter (Signed)
Pt's daughter requesting to speak with a nurse about cough. Please call pt back.

## 2020-05-26 ENCOUNTER — Other Ambulatory Visit: Payer: Self-pay | Admitting: *Deleted

## 2020-05-26 MED ORDER — LEVOCETIRIZINE DIHYDROCHLORIDE 5 MG PO TABS: ORAL_TABLET | ORAL | 3 refills | Status: AC

## 2020-05-26 NOTE — Telephone Encounter (Signed)
Patient's daughter called requesting refill on her mother's Levocetirizine be refilled.

## 2020-06-25 ENCOUNTER — Other Ambulatory Visit: Payer: Self-pay | Admitting: *Deleted

## 2020-06-25 ENCOUNTER — Other Ambulatory Visit: Payer: Self-pay

## 2020-06-25 NOTE — Patient Outreach (Signed)
[facility]) Care Management  06/25/2020  Stephanie Horne 11-Nov-1940 [id number]  [facility]. outreach for follow up assessment-complex care patient  Stephanie Horne referred to [facility] in August 2020 from Stephanie Horne, pharmacy for medication cost help with Eliquis patient assistance program The medication concerns have been addressed in December 2020 by [name] staff  Insuranceunited healthcare medicare  Patient's daughter Stephanie Horne is able to verify HIPAA ([place] and Accountability Act) identifiers Reviewed and addressed the purpose of the follow up call   Consent: [facility] RN CM reviewed Stephanie Horne services with her  Patient has given verbal consent for servicesfor [facility] telephonic RN CMto speak with her daughter Stephanie Horne Pt with memory concerns   Stephanie Horne reports she is present hospitalized related to a lupus flare up She reports her brother from [place] arrived to stay with Stephanie Horne  [facility] RN CM discuss [facility] services and progression with her for the patient Nutrition needs  It was noted in July 2021 Epic notes Stephanie Horne was reported per Stephanie Horne to her PCP of not eating well  PCP recommended continued supplements like ensure or boost  [facility] RN CM's  last 3 outreach attempts to Stephanie Horne were follow up calls related to supplement coupons sent to the home in May 2021  Pt last wt noted ws 123 lbs on 03/05/20 BMI 17.71 ht 5'10" wt as 122 lbs on 01/16/20 Messages were left and return outreaches from [place] were not received    Pharmacy needs Stephanie Horne states Stephanie Horne is at "three 64"  Related to 3% of their household income on medication expenses  She reports Stephanie Horne will need [facility] pharmacy assistance again  She is requesting another call attempt in the next "few weeks' to discuss possible assistance   Stephanie Horne's pharmacy services concluded on 09/13/19 as she was provide was sent a 3 month supply of Eliquis from [employer] squibb's patient  assistance program Stephanie Horne was encouraged to re apply in 2021 after patient had spent 3% of their household income on medication expenses  plans [facility] RN CM will outreach to Stephanie Horne within the next 30 days to assess pharmacy needs Pt encouraged to return a call to [facility] RN CM prn Routed note to MD  Goals Addressed              This Visit's Progress     Patient Stated     [facility]) Patient will be able to receive medication assistance for eliquis if possible (pt-stated)        CARE PLAN ENTRY (see longitudinal plan of care for additional care plan information)   Current Barriers:   Knowledge Deficits related to heart failure medications  Case Manager Clinical Goal(s):   Over the next 90 days, patient will take all Heart Failure mediations as prescribed  Over the next 45 days patient/family will work with pharmacy services related to Eliquis possible medication assistance   Interventions:   Basic overview and discussion of pathophysiology of Heart Failure reviewed   Provided verbal education on low sodium diet  Discussed importance of daily weight and advised patient to weigh and record daily  Assessed for supplement coupons and further pharmacy assistance with Eliquis  Patient Self Care Activities:   Takes Heart Failure Medications as prescribed  Weighs daily and record (notifying MD of 3 lb weight gain over night or 5 lb in a week)  Verbalizes understanding of and follows CHF Action Plan  Adheres to low sodium diet  Pt/daughter will work with pharmacy for possible medication assistance  Initial goal documentation Please see other previous [facility] care plan information listed in Epic under the flow sheet section        Stephanie Horne L. [name], RN, BSN, [place] Coordinator Office number [phone] Main [facility] number [phone] Fax number [phone]

## 2020-07-03 ENCOUNTER — Encounter: Payer: Medicare Other | Admitting: Internal Medicine

## 2020-07-08 ENCOUNTER — Telehealth: Payer: Self-pay | Admitting: Internal Medicine

## 2020-07-08 NOTE — Telephone Encounter (Signed)
Sounds good. We will address this issue during her visit.

## 2020-07-08 NOTE — Telephone Encounter (Signed)
Returned call to patient's daughter. States a Rep from [facility] came to home today and spoke with patient's son stating patient would qualify for someone to assist in the home. Routinely, PCS is only covered by Medicaid which daughter states patient does not qualify for. She will call back with Rep's name and contact info. Patient has appt with [facility] on 07/10/2020. If there are any home health needs such as RN or PT, it can be discussed at that time. Hubbard Hartshorn, BSN, RN-BC

## 2020-07-08 NOTE — Telephone Encounter (Signed)
Pls contact pt daughter Stephanie Horne, pt daughter normal takes care of pt(mom) however Stephanie Horne is having lupus flares and it is attacking her heart, it is making it hard for her to care for her mom like she normally does.  Pt daughter is requesting that a nurse come in and assist with patient; she unable to take care of her mom during these flares up [phone]

## 2020-07-09 ENCOUNTER — Other Ambulatory Visit: Payer: Self-pay | Admitting: *Deleted

## 2020-07-09 ENCOUNTER — Other Ambulatory Visit: Payer: Self-pay

## 2020-07-09 NOTE — Patient Outreach (Signed)
[place] [facility]) Care Management  07/09/2020  Stephanie Horne 1941/05/20 [id number]   [facility] outreach for follow up assessment-complex care patient  Stephanie Horne referred to [facility] in August 2020 from Stephanie Horne, pharmacy for medication cost help with Eliquis patient assistance program The medication concerns have been addressed in December 2020 by Stephanie Horne staff  Insuranceunited healthcare medicare  Patient's son. Stephanie Horne answers the now preferred number listed in Epic as [phone]  Reviewed and addressed the purpose of the follow up call briefly and request a message be left for Stephanie Horne, daughter as Stephanie Horne had given permission for [facility] RN CM to speak with Stephanie Horne as she has hearing and memory issues at initiation of Stephanie Horne referral   Stephanie Horne, son, shares that "we are meeting with Doctor Stephanie Horne on Friday about home health"  [facility] RN CM discusses this is a good intervention. Provided empathy  Attempted to leave a message for Stephanie Horne at [phone]  to return a call to [place] CM if Stephanie Horne is still anticipating medication cost concerns but her voice mail box is full at this time    Pharmacy needs Stephanie Horne had stated Stephanie Horne is at "three 17"  Related to 3% of their household income on medication expenses  She reported Stephanie Horne may need [place] assistance again  She is requesting another call attempt in the next "few weeks' to discuss possible assistance   Stephanie [place] services concluded on 09/13/19 as she was provide was sent a 3 month supply of Eliquis from [employer] squibb's patient assistance program Stephanie Horne was encouraged to re apply in 2021 after patient had spent 3% of their household income on medication expenses  Stephanie Horne, [place] RN CM left a message about patient  [facility] RN CM   plans [facility] RN CM will discuss case with Stephanie Horne to provide a warm transfer to [facility] embedded CM services  Routed note to MD  Stephanie Horne. [place] Management Coordinator Office  [phone]  Fax: [phone]  [address], [place], [place] [address] Website:  [email or web address]

## 2020-07-10 ENCOUNTER — Encounter: Payer: Self-pay | Admitting: Internal Medicine

## 2020-07-10 ENCOUNTER — Ambulatory Visit (INDEPENDENT_AMBULATORY_CARE_PROVIDER_SITE_OTHER): Payer: Medicare Other | Admitting: Internal Medicine

## 2020-07-10 ENCOUNTER — Encounter: Payer: Self-pay | Admitting: *Deleted

## 2020-07-10 VITALS — BP 128/57 | HR 67 | Temp 98.0°F | Ht 69.0 in | Wt 116.6 lb

## 2020-07-10 DIAGNOSIS — G301 Alzheimer's disease with late onset: Secondary | ICD-10-CM

## 2020-07-10 DIAGNOSIS — R634 Abnormal weight loss: Secondary | ICD-10-CM

## 2020-07-10 DIAGNOSIS — F0280 Dementia in other diseases classified elsewhere without behavioral disturbance: Secondary | ICD-10-CM

## 2020-07-10 DIAGNOSIS — D509 Iron deficiency anemia, unspecified: Secondary | ICD-10-CM

## 2020-07-10 DIAGNOSIS — Z23 Encounter for immunization: Secondary | ICD-10-CM

## 2020-07-10 DIAGNOSIS — R2681 Unsteadiness on feet: Secondary | ICD-10-CM

## 2020-07-10 NOTE — Assessment & Plan Note (Signed)
Agrees to receive Flu vaccine in clinic today

## 2020-07-10 NOTE — Assessment & Plan Note (Addendum)
Wt Readings from Last 3 Encounters:  07/10/20 116 lb 9.6 oz (52.9 kg)  03/05/20 123 lb 6.4 oz (56 kg)  01/16/20 122 lb (55.3 kg)   Stephanie Horne presents for follow up on weight loss. History provided with assistance of her son. Continues to have progressive weight loss. Weight trend 10/09/19 135lb ->07/10/20 116 pounds. 20 pound weight loss in year and a half. Son confirms that this is due to dietary changes as Stephanie Horne avoids eating to prevent her diarrhea from reoccurrence. She is currently on daily imodium. They have had follow up with GI per Dr.Hoffman's recommendation last visit and Dr.Ganem described benefits and risks of colonoscopy/endoscopy and had asked to return if they decided to proceed with said procedures.  A/P Progressive weight loss due to Alzheimer's Disease and poor intake and chronic diarrhea. Differential includes malignancy, smoldering infection. Will repeat labs to assess for worsening anemia / electrolyte abnormalities. Advised to f/u with GI as her weight loss appears to be worsening - F/u with GI - Check cbc, cmp

## 2020-07-10 NOTE — Patient Instructions (Addendum)
Thank you for allowing [place] to provide your care today. Today we discussed your weakness and diarrhea    I have ordered cbc, cmp labs for you. I will call if any are abnormal.    Today we made no changes to your medications.    Please follow-up as needed.    Should you have any questions or concerns please call the internal medicine clinic at [phone].     Chronic Diarrhea Chronic diarrhea is a condition in which a person passes frequent loose and watery stools for 4 weeks or longer. Non-chronic diarrhea usually lasts for only 2-3 days. Diarrhea can cause a person to feel weak and dehydrated. Dehydration can make the person tired and thirsty. It can also cause a dry mouth, decreased urination, and dark yellow urine. Diarrhea is a sign of an underlying problem, such as:  Infection.  Side effects of medicines.  Problems digesting something in your diet, such as milk products if you have lactose intolerance.  Conditions such as celiac disease, irritable bowel syndrome (IBS), or inflammatory bowel disease (IBD). If you have chronic diarrhea, make sure you treat it as told by your health care provider. Follow these instructions at home: Medicines  Take over-the-counter and prescription medicines only as told by your health care provider.  If you were prescribed an antibiotic medicine, take it as told by your health care provider. Do not stop taking the antibiotic even if you start to feel better. Eating and drinking   Follow instructions from your health care provider about what to eat and drink. You may have to: ? Avoid foods that trigger diarrhea for you. ? Take an oral rehydration solution (ORS). This is a drink that keeps you hydrated. It can be found at pharmacies and retail stores. ? Drink clear fluids, such as water, diluted fruit juice, and low-calorie sports drinks. You can also get fluids by sucking on ice chips. ? Drink enough fluid to keep your urine pale yellow. This  will help you avoid dehydration. ? Eat small amounts of bland foods that are easy to digest as you are able. These foods include bananas, applesauce, rice, lean meats, toast, and crackers. ? Avoid spicy or fatty foods. ? Avoid foods and beverages that contain a lot of sugar or caffeine.  Do not drink alcohol if: ? Your health care provider tells you not to drink. ? You are pregnant, may be pregnant, or are planning to become pregnant.  If you drink alcohol: ? Limit how much you use to:  0-1 drink a day for women.  0-2 drinks a day for men. ? Be aware of how much alcohol is in your drink. In the [place] one drink equals one 12 oz bottle of beer (355 mL), one 5 oz glass of wine (148 mL), or one 1 oz glass of hard liquor (44 mL). General instructions   Wash your hands often and after each diarrhea episode. Use soap and water. If soap and water are not available, use hand sanitizer.  Make sure that all people in your household wash their hands well and often.  Rest as told by your health care provider.  Watch your condition for any changes.  Take a warm bath to relieve any burning or pain from frequent diarrhea episodes.  Keep all follow-up visits as told by your health care provider. This is important. Contact a health care provider if:  You have a fever.  Your diarrhea gets worse or does not get better.  You have new symptoms.  You cannot drink fluid without vomiting.  You feel light-headed or dizzy.  You have a headache.  You have muscle cramps.  You have severe pain in the rectum. Get help right away if:  You have vomiting that does not go away.  You have chest pain.  You feel very weak or you faint.  You have bloody or black stools, or stools that look like tar.  You have severe pain, cramping, or bloating in your abdomen, or pain that stays in one place.  You have trouble breathing or you are breathing very quickly.  Your heart is beating very  quickly.  Your skin feels cold and clammy.  You feel confused.  You have a severe headache.  You have signs or symptoms of dehydration, such as: ? Dark urine, very little urine, or no urine. ? Cracked lips. ? Dry mouth. ? Sunken eyes. ? Sleepiness. ? Weakness. These symptoms may represent a serious problem that is an emergency. Do not wait to see if the symptoms will go away. Get medical help right away. Call your local emergency services (911 in the [place] Do not drive yourself to the hospital. Summary  Chronic diarrhea is a condition in which a person passes frequent loose and watery stools for 4 weeks or longer.  Diarrhea is a sign of an underlying problem.  Make sure you treat your diarrhea as told by your health care provider.  Drink enough fluid to keep your urine pale yellow. This will help you avoid dehydration.  Wash your hands often and after each diarrhea episode. If soap and water are not available, use hand sanitizer. This information is not intended to replace advice given to you by your health care provider. Make sure you discuss any questions you have with your health care provider. Document Revised: 03/18/2019 Document Reviewed: 03/18/2019 Elsevier Patient Education  [employer].

## 2020-07-10 NOTE — Assessment & Plan Note (Signed)
Previously had iron deficiency anemia treated with IV Feraheme at short stay. Post-transfusion ferritin improved. Hemoglobin rising. Currently on oral supplements. Denies any constipation.  A/P Will recheck cbc to ensure stable hemoglobin - cbc

## 2020-07-10 NOTE — Assessment & Plan Note (Signed)
Stephanie Horne is an 79 yo F w/ PMH of iron deficiency anemia, alzheimer's dementia, htn, diastolic heart failure, O0HO presenting to [facility] for weight loss. During evaluation family expresses concern regarding safety at home as [name] has been having ongoing weight loss and deconditioning as she spends most of her day sitting at home without eating. She mentions she ambulates with walker at home and is confident she has no difficulty with mobility at home but her son confirms that she has history of frequent falls.  A/p Timed Up and Go test performed with ambulation requiring >12 seconds. Concerning for risk of recurrent falls especially in setting of known osteoporosis. Will make referral for physical therapy. - Referral for HHPT, HHA

## 2020-07-10 NOTE — Assessment & Plan Note (Signed)
Currently on Aricept. Lives alone and performs limited ADLs. Dependent for IADLs on family members although [name] disagrees with her son regarding her abilities. Currently lives alone. Son is temporarily living with her but plans to return home soon. Daughter lives locally but has recurrent hospitalizations for Lupus flares. Son express significant concern regarding safety at home and [name]'s ability to care for herself at home.  A/P Had prolonged conversation regarding goals of care and future plans with progressive Alzheimer's. Stephanie Horne express desire to stay at home. Discussed placement at memory unit vs staying at home and attending day care center such as [facility]. Brochures provided and questions answered. At this time, [name] does not wish to pursue these programs but encouraged her and family to discuss and come up with a plan. - C/w Aricept 5mg  daily - Information on local resources for increased care-taker support / alternative to home living provided

## 2020-07-10 NOTE — Progress Notes (Signed)
CC: Weight loss  HPI: [name] is a 79 y.o. with PMH listed below presenting with complaint of weight loss. Please see problem based assessment and plan for further details.  Past Medical History:  Diagnosis Date  . AIHA (autoimmune hemolytic anemia) ([place])    12/08 onset  . Atrial fibrillation ([place])    not on coumidin because AIHA and major bleeding complications  . Axillary lymphadenopathy    left negative MMG 1/08  . Back pain, chronic   . Complicated grief 5/40/0867   Death of husband in 2011-01-18   . Diabetes mellitus   . Hyperlipidemia   . Hypertension   . Lupus ([place])    Treated with prednisone indefinitely   . Mitral regurgitation   . Mitral regurgitation 10/24/2006   2d Echo from 18-Jan-2008 - EF 60%, Moderated MR    . Seasonal allergies     Review of Systems: Review of Systems  Constitutional: Positive for weight loss. Negative for chills, fever and malaise/fatigue.  Eyes: Negative for blurred vision.  Respiratory: Negative for shortness of breath.   Cardiovascular: Negative for chest pain, palpitations and leg swelling.  Gastrointestinal: Positive for diarrhea. Negative for abdominal pain, blood in stool, nausea and vomiting.  Genitourinary: Negative for frequency and urgency.  Musculoskeletal: Negative for back pain and joint pain.  Neurological: Positive for weakness. Negative for dizziness, sensory change, focal weakness and headaches.  Psychiatric/Behavioral: Positive for memory loss. Negative for depression. The patient is not nervous/anxious.   All other systems reviewed and are negative.   Physical Exam: Vitals:   07/10/20 1410  BP: (!) 128/57  Pulse: 67  Temp: 98 F (36.7 C)  TempSrc: Oral  Weight: 116 lb 9.6 oz (52.9 kg)  Height: 5\' 9"  (1.753 m)    Gen: chronically ill-appearing, cachetic, NAD HEENT: NCAT head, hearing intact, R eye lower lid chalazion, + temporal wasting CV: RRR, S1, S2 normal Pulm: CTAB, No rales, no wheezes Abd: NTND,  BS+ Extm: ROM intact, Peripheral pulses intact, No peripheral edema Skin: Dry, Warm, poor turgor  Assessment & Plan:   Gait instability Mrs.Stephanie Horne is an 18 yo F w/ PMH of iron deficiency anemia, alzheimer's dementia, htn, diastolic heart failure, Y1PJ presenting to [facility] for weight loss. During evaluation family expresses concern regarding safety at home as [name] has been having ongoing weight loss and deconditioning as she spends most of her day sitting at home without eating. She mentions she ambulates with walker at home and is confident she has no difficulty with mobility at home but her son confirms that she has history of frequent falls.  A/p Timed Up and Go test performed with ambulation requiring >12 seconds. Concerning for risk of recurrent falls especially in setting of known osteoporosis. Will make referral for physical therapy. - Referral for HHPT, HHA  Weight loss Wt Readings from Last 3 Encounters:  07/10/20 116 lb 9.6 oz (52.9 kg)  03/05/20 123 lb 6.4 oz (56 kg)  01/16/20 122 lb (55.3 kg)   [name] presents for follow up on weight loss. History provided with assistance of her son. Continues to have progressive weight loss. Weight trend 10/09/19 135lb ->07/10/20 116 pounds. 20 pound weight loss in year and a half. Son confirms that this is due to dietary changes as [name] avoids eating to prevent her diarrhea from reoccurrence. She is currently on daily imodium. They have had follow up with GI per Dr.Hoffman's recommendation last visit and Dr.Ganem described benefits and risks of colonoscopy/endoscopy and  had asked to return if they decided to proceed with said procedures.  A/P Progressive weight loss due to Alzheimer's Disease and poor intake and chronic diarrhea. Differential includes malignancy, smoldering infection. Will repeat labs to assess for worsening anemia / electrolyte abnormalities. Advised to f/u with GI as her weight loss appears to be worsening - F/u with  GI - Check cbc, cmp  Alzheimer's dementia without behavioral disturbance (HCC) Currently on Aricept. Lives alone and performs limited ADLs. Dependent for IADLs on family members although [name] disagrees with her son regarding her abilities. Currently lives alone. Son is temporarily living with her but plans to return home soon. Daughter lives locally but has recurrent hospitalizations for Lupus flares. Son express significant concern regarding safety at home and [name]'s ability to care for herself at home.  A/P Had prolonged conversation regarding goals of care and future plans with progressive Alzheimer's. [name] express desire to stay at home. Discussed placement at memory unit vs staying at home and attending day care center such as [facility]. Brochures provided and questions answered. At this time, [name] does not wish to pursue these programs but encouraged her and family to discuss and come up with a plan. - C/w Aricept 5mg  daily - Information on local resources for increased care-taker support / alternative to home living provided  Iron deficiency anemia Previously had iron deficiency anemia treated with IV Feraheme at short stay. Post-transfusion ferritin improved. Hemoglobin rising. Currently on oral supplements. Denies any constipation.  A/P Will recheck cbc to ensure stable hemoglobin - cbc  Need for immunization against influenza Agrees to receive Flu vaccine in clinic today    Patient discussed with Dr. Evette Doffing  -Gilberto Better, [facility] [place] [facility] Pager: [phone]

## 2020-07-11 LAB — CBC WITH DIFFERENTIAL/PLATELET
Basophils Absolute: 0 10*3/uL (ref 0.0–0.2)
Basos: 0 %
EOS (ABSOLUTE): 0 10*3/uL (ref 0.0–0.4)
Eos: 0 %
Hematocrit: 32 % — ABNORMAL LOW (ref 34.0–46.6)
Hemoglobin: 10.5 g/dL — ABNORMAL LOW (ref 11.1–15.9)
Immature Grans (Abs): 0 10*3/uL (ref 0.0–0.1)
Immature Granulocytes: 0 %
Lymphocytes Absolute: 1.2 10*3/uL (ref 0.7–3.1)
Lymphs: 17 %
MCH: 32.4 pg (ref 26.6–33.0)
MCHC: 32.8 g/dL (ref 31.5–35.7)
MCV: 99 fL — ABNORMAL HIGH (ref 79–97)
Monocytes Absolute: 0.3 10*3/uL (ref 0.1–0.9)
Monocytes: 5 %
Neutrophils Absolute: 5.4 10*3/uL (ref 1.4–7.0)
Neutrophils: 78 %
Platelets: 193 10*3/uL (ref 150–450)
RBC: 3.24 x10E6/uL — ABNORMAL LOW (ref 3.77–5.28)
RDW: 13 % (ref 11.7–15.4)
WBC: 6.9 10*3/uL (ref 3.4–10.8)

## 2020-07-11 LAB — CMP14 + ANION GAP
ALT: 13 IU/L (ref 0–32)
AST: 18 IU/L (ref 0–40)
Albumin/Globulin Ratio: 1.9 (ref 1.2–2.2)
Albumin: 4.5 g/dL (ref 3.6–4.6)
Alkaline Phosphatase: 74 IU/L (ref 44–121)
Anion Gap: 19 mmol/L — ABNORMAL HIGH (ref 10.0–18.0)
BUN/Creatinine Ratio: 12 (ref 12–28)
BUN: 10 mg/dL (ref 8–27)
Bilirubin Total: 0.4 mg/dL (ref 0.0–1.2)
CO2: 23 mmol/L (ref 20–29)
Calcium: 9.8 mg/dL (ref 8.7–10.3)
Chloride: 98 mmol/L (ref 96–106)
Creatinine, Ser: 0.82 mg/dL (ref 0.57–1.00)
GFR calc Af Amer: 78 mL/min/{1.73_m2} (ref >59)
GFR calc non Af Amer: 67 mL/min/{1.73_m2} (ref >59)
Globulin, Total: 2.4 g/dL (ref 1.5–4.5)
Glucose: 235 mg/dL — ABNORMAL HIGH (ref 65–99)
Potassium: 4.4 mmol/L (ref 3.5–5.2)
Sodium: 140 mmol/L (ref 134–144)
Total Protein: 6.9 g/dL (ref 6.0–8.5)

## 2020-07-14 NOTE — Progress Notes (Signed)
Internal Medicine Clinic Attending  Case discussed with Dr. Lee  At the time of the visit.  We reviewed the resident's history and exam and pertinent patient test results.  I agree with the assessment, diagnosis, and plan of care documented in the resident's note.    

## 2020-07-17 ENCOUNTER — Telehealth: Payer: Self-pay | Admitting: Internal Medicine

## 2020-07-17 NOTE — Telephone Encounter (Signed)
Spoke with Stephanie Horne regarding her lab results and again encouraged her to f/u with GI for colonoscopy. Stephanie Horne expressed understanding.

## 2020-07-20 ENCOUNTER — Other Ambulatory Visit: Payer: Self-pay

## 2020-07-20 NOTE — Telephone Encounter (Signed)
omeprazole (PRILOSEC) 20 MG capsule, refill request @  [place], [place] Phone:  [phone]  Fax:  [phone]     Pt's daughter at the pharmacy, please call back.

## 2020-07-21 ENCOUNTER — Ambulatory Visit: Payer: Medicare Other

## 2020-07-21 MED ORDER — OMEPRAZOLE 20 MG PO CPDR: 20.0000 mg | DELAYED_RELEASE_CAPSULE | Freq: Every day | ORAL | 1 refills | Status: DC

## 2020-07-22 ENCOUNTER — Other Ambulatory Visit: Payer: Self-pay | Admitting: Student in an Organized Health Care Education/Training Program

## 2020-07-27 ENCOUNTER — Other Ambulatory Visit: Payer: Self-pay | Admitting: *Deleted

## 2020-07-27 DIAGNOSIS — I38 Endocarditis, valve unspecified: Secondary | ICD-10-CM

## 2020-07-27 MED ORDER — POTASSIUM CHLORIDE CRYS ER 20 MEQ PO TBCR: 20.0000 meq | EXTENDED_RELEASE_TABLET | Freq: Every day | ORAL | 1 refills | Status: DC

## 2020-07-28 ENCOUNTER — Ambulatory Visit: Payer: Medicare Other | Admitting: *Deleted

## 2020-07-28 DIAGNOSIS — F0280 Dementia in other diseases classified elsewhere without behavioral disturbance: Secondary | ICD-10-CM

## 2020-07-28 DIAGNOSIS — I10 Essential (primary) hypertension: Secondary | ICD-10-CM

## 2020-07-28 DIAGNOSIS — E119 Type 2 diabetes mellitus without complications: Secondary | ICD-10-CM

## 2020-07-28 NOTE — Chronic Care Management (AMB) (Signed)
°  Chronic Care Management   Note  07/28/2020 Name: Stephanie Horne MRN: [id number] DOB: [birth year]   Called listed contactt number to complete initial chronic care management assessment; son Stephanie Horne answered.  After verifying pt's name and DOB, explained role of CCM RN and asked for patient's contact information and best time to call.  Stephanie Horne  states a home health agency has contacted him but services of physical therapy for patient have not yet started. Per referral notes, [facility] accepted the referral for HHPT.   Follow up plan: The care management team will reach out to the patient again over the next 3-5  days.   Kelli Churn RN, CCM, [facility] RN Care Manager [phone]

## 2020-07-29 ENCOUNTER — Telehealth: Payer: Medicare Other

## 2020-07-30 ENCOUNTER — Telehealth: Payer: Medicare Other

## 2020-07-30 ENCOUNTER — Telehealth: Payer: Self-pay | Admitting: *Deleted

## 2020-07-30 NOTE — Telephone Encounter (Signed)
°  Chronic Care Management   Outreach Note  07/30/2020 Name: Stephanie Horne MRN: [id number] DOB: [birth year]  Referred by: Lucious Groves, DO Reason for referral : Chronic Care Management (NIDDM, HTN, A fib, CAD, HF, dementia)   An unsuccessful telephone outreach was attempted today. The patient was referred to the case management team for assistance with care management and care coordination.   Follow Up Plan: A HIPAA compliant phone message was left for the patient providing contact information and requesting a return call.  The care management team will reach out to the patient again over the next 7-10 days.   Kelli Churn RN, CCM, [facility] RN Care Manager [phone]

## 2020-08-10 ENCOUNTER — Ambulatory Visit: Payer: Medicare Other | Admitting: *Deleted

## 2020-08-10 DIAGNOSIS — G301 Alzheimer's disease with late onset: Secondary | ICD-10-CM

## 2020-08-10 DIAGNOSIS — F0280 Dementia in other diseases classified elsewhere without behavioral disturbance: Secondary | ICD-10-CM

## 2020-08-10 DIAGNOSIS — I10 Essential (primary) hypertension: Secondary | ICD-10-CM

## 2020-08-10 DIAGNOSIS — E119 Type 2 diabetes mellitus without complications: Secondary | ICD-10-CM

## 2020-08-10 DIAGNOSIS — R2681 Unsteadiness on feet: Secondary | ICD-10-CM

## 2020-08-10 DIAGNOSIS — R634 Abnormal weight loss: Secondary | ICD-10-CM

## 2020-08-10 NOTE — Chronic Care Management (AMB) (Signed)
  Chronic Care Management   Outreach Note  08/10/2020 Name: Stephanie Horne MRN: [id number] DOB: 12-16-1940  Referred by: Lucious Groves, DO Reason for referral : Chronic Care Management (NIDDM, HTN, A fib, CAD, HF, dementia)   Second telephone outreach was attempted today. Patient states she is already in her bedroom and has taken her evening pills and would prefer the initial assessment intake call at another time. The patient was referred to the case management team for assistance with care management and care coordination.   Follow Up Plan: Telephone follow up appointment with care management team member scheduled for:08/19/20 at 11:30 am  Kelli Churn RN, CCM, [facility] RN Care Manager [phone]

## 2020-08-19 ENCOUNTER — Ambulatory Visit: Payer: Medicare Other | Admitting: *Deleted

## 2020-08-19 DIAGNOSIS — I10 Essential (primary) hypertension: Secondary | ICD-10-CM

## 2020-08-19 DIAGNOSIS — E119 Type 2 diabetes mellitus without complications: Secondary | ICD-10-CM

## 2020-08-19 DIAGNOSIS — F0280 Dementia in other diseases classified elsewhere without behavioral disturbance: Secondary | ICD-10-CM

## 2020-08-21 NOTE — Patient Instructions (Signed)
Visit Information It was nice speaking with you today. Patient Care Plan: Fall Risk (Adult)    Problem Identified: Fall Risk     Long-Range Goal: Absence of Fall and Fall-Related Injury   Start Date: 08/19/2020  This Visit's Progress: On track  Priority: High  Task: Identify and Manage Contributors to Fall Risk Completed 08/21/2020  Due Date: 10/01/2020  Priority: Routine  Note:   Care Management Activities:    - activities of daily living skills assessed - assistive or adaptive device use encouraged - cognition assessed - fall prevention plan reviewed and updated - medication list reviewed       Stephanie Horne was given information about Chronic Care Management services today including:  1. CCM service includes personalized support from designated clinical staff supervised by her physician, including individualized plan of care and coordination with other care providers 2. 24/7 contact phone numbers for assistance for urgent and routine care needs. 3. Service will only be billed when office clinical staff spend 20 minutes or more in a month to coordinate care. 4. Only one practitioner may furnish and bill the service in a calendar month. 5. The patient may stop CCM services at any time (effective at the end of the month) by phone call to the office staff. 6. The patient will be responsible for cost sharing (co-pay) of up to 20% of the service fee (after annual deductible is met).  Patient agreed to services and verbal consent obtained.   The patient verbalized understanding of instructions, educational materials, and care plan provided today and declined offer to receive copy of patient instructions, educational materials, and care plan.   The care management team will reach out to the patient again over the next 30 days.   Kelli Churn RN, CCM, [facility] RN Care Manager [phone]

## 2020-08-21 NOTE — Chronic Care Management (AMB) (Signed)
Chronic Care Management   Initial Visit Note  08/21/2020 Name: Meline M Killilea MRN: [id number] DOB: 11/22/1940  Referred by: Hoffman, Erik C, DO Reason for referral : Chronic Care Management (NIDDM, HTN, A fib, CAD, HF, dementia)   Robena M Manetta is a 79 y.o. year old female who is a primary care patient of Hoffman, Erik C, DO. The CCM team was consulted for assistance with chronic disease management and care coordination needs related to NIDDM, HTN, A fib, CAD, HF, dementia)     Review of patient status, including review of consultants reports, relevant laboratory and other test results, and collaboration with appropriate care team members and the patient's provider was performed as part of comprehensive patient evaluation and provision of chronic care management services.    SDOH (Social Determinants of Health) assessments performed: No See Care Plan activities for detailed interventions related to SDOH     Medications: Outpatient Encounter Medications as of 08/19/2020  Medication Sig  . apixaban (ELIQUIS) 5 MG TABS tablet Take 1 tablet (5 mg total) by mouth 2 (two) times daily.  . Calcium Carbonate-Vitamin D (CALTRATE 600+D) 600-400 MG-UNIT per tablet Take 1 tablet by mouth 3 (three) times daily with meals. (Patient taking differently: Take 1 tablet by mouth daily. )  . donepezil (ARICEPT) 5 MG tablet Take 1 tablet (5 mg total) by mouth daily.  . ferrous sulfate 325 (65 FE) MG tablet Take 325 mg by mouth every other day.  . furosemide (LASIX) 40 MG tablet TAKE 1 TABLET (40 MG TOTAL) BY MOUTH DAILY.  . glucose blood test strip USE TO chech blood sugar UP TO TWICE DAILY  . ibuprofen (ADVIL) 200 MG tablet Take 200 mg by mouth every 6 (six) hours as needed.  . levocetirizine (XYZAL) 5 MG tablet TAKE ONE-HALF TABLET BY MOUTH EVERY EVENING  . Loperamide HCl (IMODIUM A-D PO) Take 1 tablet by mouth daily as needed.  . memantine (NAMENDA) 5 MG tablet TAKE 1 TABLET BY MOUTH EVERY DAY  .  metoprolol tartrate (LOPRESSOR) 50 MG tablet TAKE 1 TABLET (50 MG TOTAL) BY MOUTH 2 (TWO) TIMES DAILY.  . nitroGLYCERIN (NITROSTAT) 0.4 MG SL tablet Place 1 tablet (0.4 mg total) under the tongue every 5 (five) minutes as needed for chest pain.  . omeprazole (PRILOSEC) 20 MG capsule Take 1 capsule (20 mg total) by mouth daily.  . ONETOUCH DELICA LANCETS 30G MISC USE TO check UP TO TWICE DAILY E119  . PNEUMOVAX 23 25 MCG/0.5ML injection   . Polyethyl Glycol-Propyl Glycol (SYSTANE OP) Place 1 drop into both eyes daily.  . potassium chloride SA (KLOR-CON) 20 MEQ tablet Take 1 tablet (20 mEq total) by mouth daily.  . pravastatin (PRAVACHOL) 40 MG tablet Take 1 tablet (40 mg total) by mouth daily.  . predniSONE (DELTASONE) 5 MG tablet Take 1.5 tablets (7.5 mg total) by mouth daily.  . sodium chloride (OCEAN) 0.65 % SOLN nasal spray Place 1 spray into both nostrils as needed for congestion.  . traZODone (DESYREL) 50 MG tablet Take 1 tablet (50 mg total) by mouth at bedtime as needed for sleep.  . [DISCONTINUED] Calcium Carbonate-Vitamin D (CALTRATE 600+D PO) Take 600 mg by mouth daily.   No facility-administered encounter medications on file as of 08/19/2020.     Objective:  Lab Results  Component Value Date   HGBA1C 6.4 (A) 03/05/2020   HGBA1C 6.5 (A) 11/28/2019   HGBA1C 6.1 (A) 07/25/2019   Lab Results  Component Value Date     MICROALBUR 6.0 (H) 10/08/2014   LDLCALC 62 12/09/2019   CREATININE 0.82 07/10/2020   BP Readings from Last 3 Encounters:  07/10/20 (!) 128/57  03/05/20 126/78  02/03/20 (!) 115/55    Patient Care Plan: Fall Risk (Adult)    Problem Identified: Fall Risk- family reports patient has frequent falls      Long-Range Goal: Absence of Fall and Fall-Related Injury   Start Date: 08/19/2020  This Visit's Progress: On track  Priority: High  Task: Identify and Manage Contributors to Fall Risk Completed 08/21/2020  Due Date: 10/01/2020  Priority: Routine  Care  Management Activities:    - activities of daily living skills assessed - assistive or adaptive device use encouraged - cognition assessed - fall prevention plan reviewed and updated - medication list reviewed      Ms. Sistare was given information about Chronic Care Management services today including:  1. CCM service includes personalized support from designated clinical staff supervised by her physician, including individualized plan of care and coordination with other care providers 2. 24/7 contact phone numbers for assistance for urgent and routine care needs. 3. Service will only be billed when office clinical staff spend 20 minutes or more in a month to coordinate care. 4. Only one practitioner may furnish and bill the service in a calendar month. 5. The patient may stop CCM services at any time (effective at the end of the month) by phone call to the office staff. 6. The patient will be responsible for cost sharing (co-pay) of up to 20% of the service fee (after annual deductible is met).  Patient agreed to services and verbal consent obtained.   Plan:   The care management team will reach out to the patient again over the next 30 days.   Kelli Churn RN, CCM, [facility] RN Care Manager [phone]

## 2020-08-24 NOTE — Progress Notes (Signed)
Internal Medicine Clinic Attending  CCM services provided by the care management provider and their documentation were discussed with Dr. Johnney Ou . We reviewed the pertinent findings, urgent action items addressed by the resident and non-urgent items to be addressed by the PCP.  I agree with the assessment, diagnosis, and plan of care documented in the CCM and resident's note.  Velna Ochs, MD 08/24/2020

## 2020-08-24 NOTE — Progress Notes (Signed)
Internal Medicine Clinic Resident  I have personally reviewed this encounter including the documentation in this note and/or discussed this patient with the care management provider. I will address any urgent items identified by the care management provider and will communicate my actions to the patient's PCP. I have reviewed the patient's CCM visit with my supervising attending, Dr Philipp Ovens.  Sanjuana Letters, MD 08/24/2020

## 2020-08-26 ENCOUNTER — Encounter: Payer: Self-pay | Admitting: *Deleted

## 2020-09-02 ENCOUNTER — Other Ambulatory Visit: Payer: Self-pay | Admitting: *Deleted

## 2020-09-02 MED ORDER — DONEPEZIL HCL 5 MG PO TABS: 5.0000 mg | ORAL_TABLET | Freq: Every day | ORAL | 1 refills | Status: DC

## 2020-09-03 ENCOUNTER — Other Ambulatory Visit: Payer: Self-pay | Admitting: Student in an Organized Health Care Education/Training Program

## 2020-09-21 ENCOUNTER — Other Ambulatory Visit: Payer: Self-pay

## 2020-09-21 DIAGNOSIS — I10 Essential (primary) hypertension: Secondary | ICD-10-CM

## 2020-09-21 NOTE — Telephone Encounter (Signed)
  nitroGLYCERIN (NITROSTAT) 0.4 MG SL tablet, REFILL REQUEST @  [place], [place] Phone:  [phone]  Fax:  [phone]

## 2020-09-22 MED ORDER — NITROGLYCERIN 0.4 MG SL SUBL: 0.4000 mg | SUBLINGUAL_TABLET | SUBLINGUAL | 5 refills | Status: DC | PRN

## 2020-09-23 ENCOUNTER — Other Ambulatory Visit: Payer: Self-pay | Admitting: *Deleted

## 2020-09-23 DIAGNOSIS — I10 Essential (primary) hypertension: Secondary | ICD-10-CM

## 2020-09-23 MED ORDER — FUROSEMIDE 40 MG PO TABS: ORAL_TABLET | ORAL | 1 refills | Status: DC

## 2020-10-05 ENCOUNTER — Telehealth: Payer: Self-pay | Admitting: *Deleted

## 2020-10-05 ENCOUNTER — Telehealth: Payer: Medicare Other

## 2020-10-05 NOTE — Telephone Encounter (Signed)
  Chronic Care Management   Outreach Note  10/05/2020 Name: Stephanie Horne MRN: [id number] DOB: 01/06/1941  Referred by: Gust Rung, DO Reason for referral : Chronic Care Management (NIDDM, HTN, A fib, CAD, HF, dementia)   Yi LATASIA SILBERSTEIN is enrolled in a Managed Medicaid Health Plan: No  An unsuccessful telephone outreach was attempted today.No answer at home number.  The patient was referred to the case management team for assistance with care management and care coordination.   Follow Up Plan: The care management team will reach out to the patient again over the next 7-14 days.   Cranford Mon RN, CCM, [facility] RN Care Manager [phone]

## 2020-10-12 ENCOUNTER — Telehealth: Payer: Self-pay | Admitting: *Deleted

## 2020-10-12 ENCOUNTER — Telehealth: Payer: Medicare Other

## 2020-10-12 NOTE — Telephone Encounter (Signed)
  Chronic Care Management   Outreach Note  10/12/2020 Name: Stephanie Horne MRN: [id number] DOB: 19-Sep-1941  Referred by: Lucious Groves, DO Reason for referral : Chronic Care Management ( NIDDM, HTN, A fib, CAD, HF, dementia)   A second unsuccessful telephone outreach was attempted today. No answer at home number and no option to leave message. The patient was referred to the case management team for assistance with care management and care coordination.   Follow Up Plan: The care management team will reach out to the patient again over the next 7-14 days.   Kelli Churn RN, CCM, [facility] RN Care Manager [phone]

## 2020-10-13 ENCOUNTER — Ambulatory Visit: Payer: Medicare Other | Admitting: *Deleted

## 2020-10-13 ENCOUNTER — Telehealth: Payer: Self-pay

## 2020-10-13 DIAGNOSIS — F0280 Dementia in other diseases classified elsewhere without behavioral disturbance: Secondary | ICD-10-CM

## 2020-10-13 DIAGNOSIS — E119 Type 2 diabetes mellitus without complications: Secondary | ICD-10-CM

## 2020-10-13 DIAGNOSIS — I10 Essential (primary) hypertension: Secondary | ICD-10-CM

## 2020-10-13 DIAGNOSIS — R2681 Unsteadiness on feet: Secondary | ICD-10-CM

## 2020-10-13 NOTE — Chronic Care Management (AMB) (Signed)
  Chronic Care Management   Note  10/13/2020 Name: ALEXISMARIE FLAIM MRN: [id number] DOB: 1941/05/06    Chronic Care Management   Outreach Note  10/13/2020 Name: ERINNE GILLENTINE MRN: [id number] DOB: [birth year]  Referred by: Lucious Groves, DO Reason for referral : Chronic Care Management (NIDDM, HTN, A fib, CAD, HF, dementia)   Third unsuccessful telephone outreach was attempted today. The patient was referred to the case management team for assistance with care management and care coordination. The patient's primary care provider has been notified of our unsuccessful attempts to make or maintain contact with the patient. The care management team is pleased to engage with this patient at any time in the future should he/she be interested in assistance from the care management team.   Follow Up Plan: No further follow up required: closing to CCM services as unable to maintain contact with patient. Provided patient's son with name and number for this CCM RN on 07/28/20 and advised him to contact this CCM RN prn. He voiced understanding and compliance at that time.     Kelli Churn RN, CCM, [facility] RN Care Manager [phone]

## 2020-10-13 NOTE — Telephone Encounter (Signed)
#  90 with 1 refill sent to [facility] on 07/21/2020. Daughter notified to call [facility] for refill. Hubbard Hartshorn, BSN, RN-BC

## 2020-10-13 NOTE — Telephone Encounter (Signed)
Pt daughter is requesting her omeprazole (PRILOSEC) 20 MG capsule sent to  [place], [place] Phone:  [phone]  Fax:  [phone]

## 2020-10-14 NOTE — Progress Notes (Signed)
Internal Medicine Clinic Resident  I have personally reviewed this encounter including the documentation in this note and/or discussed this patient with the care management provider. I will address any urgent items identified by the care management provider and will communicate my actions to the patient's PCP. I have reviewed the patient's CCM visit with my supervising attending, Dr Narendra.  Claire Bridge, MD  [facility] PGY-2 10/14/2020    

## 2020-10-16 NOTE — Progress Notes (Signed)
Internal Medicine Clinic Attending  CCM services provided by the care management provider and their documentation were discussed with Dr. Aslam. We reviewed the pertinent findings, urgent action items addressed by the resident and non-urgent items to be addressed by the PCP.  I agree with the assessment, diagnosis, and plan of care documented in the CCM and resident's note.  Chelsia Serres, MD 10/16/2020  

## 2020-10-21 ENCOUNTER — Other Ambulatory Visit: Payer: Self-pay | Admitting: Internal Medicine

## 2020-10-21 DIAGNOSIS — I10 Essential (primary) hypertension: Secondary | ICD-10-CM

## 2020-10-21 MED ORDER — METOPROLOL TARTRATE 50 MG PO TABS: ORAL_TABLET | ORAL | 1 refills | Status: DC

## 2020-10-21 NOTE — Telephone Encounter (Signed)
REFILL REQUEST  metoprolol tartrate (LOPRESSOR) 50 MG tablet  [facility] [place], [place] Phone:  [phone]  Fax:  [phone]

## 2020-12-07 ENCOUNTER — Encounter: Payer: Medicare Other | Admitting: Internal Medicine

## 2020-12-07 ENCOUNTER — Telehealth: Payer: Self-pay

## 2020-12-07 NOTE — Telephone Encounter (Signed)
Received TC from patient's daughter, [name].  She states patient fell and was found by her son yesterday on the floor.  They do not know how long patient was on the floor.  Daughter states she does not see any visible injuries.  RN encouraged daughter to take pt to ED for evaluation/imaging from fall.  States patient is refusing to go to the hospital for evaluation.  RN offered an appt in clinic, daughter states patient refuses this as well.  Daughter states patient refuses to leave the house. Daughter states patient will not tell her if she is hurting because she does not want to leave the house.  Daughter is asking if [facility] nurse can come to the house and evaluate patient.  Daughter is confused if they have [place] services at this time.  She states patient's son has seen "a nurse" 2 times since Thanksgiving.  Per chart review, HHPT and HHA referral was placed 16/5790, but RN uncertain if visits were completed.  Will forward to Lauren and Mammie to assist. Thank you, SChaplin, RN,BSN

## 2020-12-07 NOTE — Telephone Encounter (Signed)
I spoke to Stephanie Horne was discharged from services 08-2020 [place], [place] C3/7/20221:54 PM

## 2020-12-07 NOTE — Telephone Encounter (Signed)
Returned call to patient's daughter, [name]. Explained that patient was d/c from [facility] PT/Aide as of Nov 2021. Encouraged again to take patient to ED for imaging or make clinic appt today. States mom will not go and denies being in any pain. Offered tele appt this afternoon, however, daughter is on the way to her own doctor at present. Tele appt given tomorrow AM with Red Team.

## 2020-12-07 NOTE — Telephone Encounter (Signed)
Patient previously with [facility]. Will forward to [facility] to contact [facility] to see if patient is still active with them.

## 2020-12-08 ENCOUNTER — Other Ambulatory Visit: Payer: Self-pay

## 2020-12-08 ENCOUNTER — Ambulatory Visit (INDEPENDENT_AMBULATORY_CARE_PROVIDER_SITE_OTHER): Payer: Medicare Other | Admitting: Internal Medicine

## 2020-12-08 ENCOUNTER — Telehealth: Payer: Self-pay | Admitting: *Deleted

## 2020-12-08 DIAGNOSIS — R2681 Unsteadiness on feet: Secondary | ICD-10-CM

## 2020-12-08 DIAGNOSIS — W19XXXA Unspecified fall, initial encounter: Secondary | ICD-10-CM | POA: Diagnosis not present

## 2020-12-08 DIAGNOSIS — G301 Alzheimer's disease with late onset: Secondary | ICD-10-CM

## 2020-12-08 DIAGNOSIS — F0280 Dementia in other diseases classified elsewhere without behavioral disturbance: Secondary | ICD-10-CM

## 2020-12-08 DIAGNOSIS — R634 Abnormal weight loss: Secondary | ICD-10-CM | POA: Diagnosis not present

## 2020-12-08 DIAGNOSIS — R296 Repeated falls: Secondary | ICD-10-CM

## 2020-12-08 MED ORDER — MIRTAZAPINE 15 MG PO TABS: 7.5000 mg | ORAL_TABLET | Freq: Every day | ORAL | 1 refills | Status: DC

## 2020-12-08 MED ORDER — DONEPEZIL HCL 10 MG PO TABS: 10.0000 mg | ORAL_TABLET | Freq: Every day | ORAL | 5 refills | Status: AC

## 2020-12-08 NOTE — Assessment & Plan Note (Signed)
See "frequent falls"

## 2020-12-08 NOTE — Progress Notes (Signed)
CC: fall  This is a telephone encounter between [place] and [place] on 12/08/2020 for fall. The visit was conducted with the patient located at home and Marty Heck at [facility]. The patient's identity was confirmed using their DOB and current address. The patient has consented to being evaluated through a telephone encounter and understands the associated risks (an examination cannot be done and the patient may need to come in for an appointment) / benefits (allows the patient to remain at home, decreasing exposure to coronavirus). I personally spent 16 minutes on medical discussion.   HPI:  [name] is a 80 y.o. with PMH as below.   Please see A&P for assessment of the patient's acute and chronic medical conditions.   The patient's nephew found her laying on the floor yesterday. She crawled to the door and unlocked the door. Her states she missed her chair trying to sit and she fell. She refused to go to the hospital. She was able to walk afterward. The patient urinated and defecated on herself and wouldn't let anyone clean her up at first. The patient has a history of dementia, which is worsening.  Her daughter is not sure how long she was on the floor. She checked the camera and it didn't appear to be long, maybe around 10 minutes. The patient states she is not having any pain but did have the daughter buy her some BC powders. She did not hit her head when she fell. The patient currently lives alone but her son is visiting from [place] and is staying with her. Otherwise family is almost always with her. They bring her food every day and she only eats about once per day. No one is checking her diabetes. She eats breakfast once per day and will have a glucerna, and that is about it. She is very tiny and has lost a lot of weight. She uses a walker to get around. She did have PT for a while with a nurse, but when the PT stopped so did the nurse. This ended around last  Thanksgiving. She concerned about the eating.  She is taking her medications every day as her daughter takes care of them. She is on namenda 5mg  and aricept 5 mg.    Past Medical History:  Diagnosis Date  . AIHA (autoimmune hemolytic anemia) ([place])    12/08 onset  . Atrial fibrillation ([place])    not on coumidin because AIHA and major bleeding complications  . Axillary lymphadenopathy    left negative MMG 1/08  . Back pain, chronic   . Complicated grief 3/50/0938   Death of husband in 12-29-2010   . Diabetes mellitus   . Hyperlipidemia   . Hypertension   . Lupus ([place])    Treated with prednisone indefinitely   . Mitral regurgitation   . Mitral regurgitation 10/24/2006   2d Echo from 2007/12/29 - EF 60%, Moderated MR    . Seasonal allergies    Review of Systems:   Review of Systems  Constitutional: Positive for weight loss. Negative for chills, diaphoresis, fever and malaise/fatigue.  Respiratory: Negative for cough, shortness of breath and wheezing.   Cardiovascular: Negative for chest pain, palpitations and leg swelling.  Gastrointestinal: Negative for abdominal pain, constipation, diarrhea, nausea and vomiting.  Genitourinary: Negative for dysuria, frequency and urgency.  Musculoskeletal: Positive for falls. Negative for back pain, joint pain, myalgias and neck pain.  Neurological: Positive for weakness. Negative for dizziness, tingling, sensory  change, focal weakness, loss of consciousness and headaches.    Assessment & Plan:   See Encounters Tab for problem based charting.  Patient discussed with Dr. Jimmye Norman

## 2020-12-08 NOTE — Assessment & Plan Note (Addendum)
The Stephanie Horne's nephew found her laying on the floor yesterday. She crawled to the door and unlocked the door. Her states she missed her chair trying to sit and she fell. She refused to go to the hospital. She was able to walk afterward. The Stephanie Horne urinated and defecated on herself and wouldn't let anyone clean her up at first. The Stephanie Horne has a history of dementia, which is worsening.  Her daughter is not sure how long she was on the floor. She checked the camera and it didn't appear to be long, maybe around 10 minutes. The Stephanie Horne states she is not having any pain but did have the daughter buy her some BC powders. She did not hit her head when she fell. The Stephanie Horne currently lives alone but her son is visiting from [place] and is staying with her. Otherwise family is almost always with her. They bring her food every day and she only eats about once per day. No one is checking her diabetes. She eats breakfast once per day and will have a glucerna, and that is about it. She is very tiny and has lost a lot of weight. She uses a walker to get around. She did have PT for a while with a nurse, but when the PT stopped so did the nurse. This ended around last Thanksgiving. She concerned about the eating.  She is taking her medications every day as her daughter takes care of them. She is on namenda 5mg  and aricept 5 mg.  The Stephanie Horne is also on apixaban and high risk for bleeding with falls. At this time it does not sound as if she has had an acute bleed, but I have encouraged the daughter to try to bring her mother into clinic.    - given information for [facility], she does not have medicaid and unfortunately does not qualify for aide at home for this reason - come to clinic when able and we can order home health/PT, unable to do this through telehealth visit.  - increase aricept to 10 mg qd - start mirtazapine 7.5 mg qd

## 2020-12-08 NOTE — Telephone Encounter (Signed)
Call to patient.  No answer.  Sander Nephew, RN 12/08/2020 10:00 AM/

## 2020-12-14 NOTE — Progress Notes (Signed)
Internal Medicine Clinic Attending  Case discussed with Dr. Sharon Seller  At the time of the visit.  We reviewed the resident's history and exam and pertinent patient test results.  I agree with the assessment, diagnosis, and plan of care documented in the resident's note. Many concerning features of worsening cognitive and physical function merit goals of care conversation with PCP.

## 2020-12-31 ENCOUNTER — Telehealth: Payer: Self-pay

## 2020-12-31 NOTE — Telephone Encounter (Signed)
Return call to daughter,Stephanie Horne - states pt has a lot saliva/spis a lot. Demands a soda every day; daughter states she gives her a Sprite. She thinks her mother has lost weight even she eats.States her brother is leaving today and leaves Stephanie Horne a primary care giver. Pt lives alone. States her (daughter) lupus is getting worse. States pt refuses to come to see the doctor. I asked abut home health; Stephanie Horne states pt has fired everyone; states she Lucianne Lei take care of herself. Stephanie Horne states pt felled 2 weeks ago. Stephanie Horne states she unsure what else to do?

## 2020-12-31 NOTE — Telephone Encounter (Signed)
Pt daughter  Requesting a call back about her mom spitting a lot still and wanting to know if it is ok to give her soda  And her behaviors are becoming worse

## 2021-01-01 NOTE — Telephone Encounter (Signed)
Stephanie Horne a call back, discussed the increase saliva is likely due to the recent increase in aricept dose from 5 to 10mg , she will tried decreasing back to 5mg .  OK for her to have occasional sprite. Also discussed potential of mixture of water and juice if it helps for taste.    Glenda could you see if we could have the [facility] team go out as part of [facility] if she qualifies?

## 2021-01-04 NOTE — Telephone Encounter (Signed)
Glenda, Just FYI. This patient is not active with CCM as of 10/13/20 as we were unable to maintain contact with patient.  Marcie Bal

## 2021-01-05 NOTE — Telephone Encounter (Signed)
Call placed to Stephanie Horne of [place] at [phone]. No answer. Left message on VM requesting return call.

## 2021-01-06 NOTE — Telephone Encounter (Signed)
Great, thanks

## 2021-01-06 NOTE — Telephone Encounter (Signed)
Dorian Pod, RN Phone Number: [phone]   We talked to the daughter and got her scheduled for friday at 22.   Thanks again!

## 2021-01-06 NOTE — Telephone Encounter (Signed)
Dorian Pod, RN Phone Number: [phone]   Cloretta Ned,   I tried to call back but when they transferred me it kept ringing. We are going to reach out and schedule Ms. [place] for an assessment. Thank you for the referral!

## 2021-01-12 ENCOUNTER — Other Ambulatory Visit: Payer: Self-pay

## 2021-01-12 DIAGNOSIS — I10 Essential (primary) hypertension: Secondary | ICD-10-CM

## 2021-01-12 MED ORDER — APIXABAN 5 MG PO TABS: 5.0000 mg | ORAL_TABLET | Freq: Two times a day (BID) | ORAL | 11 refills | Status: DC

## 2021-01-12 NOTE — Telephone Encounter (Signed)
  apixaban (ELIQUIS) 5 MG TABS tablet, REFILL REQUEST @  [place], [place] Phone:  [phone]  Fax:  [phone]

## 2021-01-19 ENCOUNTER — Telehealth: Payer: Self-pay

## 2021-01-19 NOTE — Telephone Encounter (Signed)
Requesting to speak with a nurse about getting a nurse to do home visit. Please call back.

## 2021-01-19 NOTE — Telephone Encounter (Signed)
Patient has [facility]. Returned call to daughter. States she was told by PT with [facility] that they are shutting down. Confirmed this with Stanton Kidney at [facility]. Advised daughter to schedule appt at [facility] but patient refuses. She will talk to her mother and try to convince her to be seen.

## 2021-02-10 ENCOUNTER — Other Ambulatory Visit: Payer: Self-pay | Admitting: *Deleted

## 2021-02-10 DIAGNOSIS — I5032 Chronic diastolic (congestive) heart failure: Secondary | ICD-10-CM

## 2021-02-10 DIAGNOSIS — I38 Endocarditis, valve unspecified: Secondary | ICD-10-CM

## 2021-02-10 NOTE — Telephone Encounter (Addendum)
Received faxed refill request  trazodone 50mg  request from [facility]) and Potassium 20 meq request from [facility].  Spoke with pt's dtr Due to cost-they use both pharmacies   .   Also requesting trazodone refill be sent to [facility] (rx expired and fell off medication list)

## 2021-02-12 MED ORDER — POTASSIUM CHLORIDE CRYS ER 20 MEQ PO TBCR: 20.0000 meq | EXTENDED_RELEASE_TABLET | Freq: Every day | ORAL | 1 refills | Status: AC

## 2021-02-12 MED ORDER — TRAZODONE HCL 50 MG PO TABS: 50.0000 mg | ORAL_TABLET | Freq: Every evening | ORAL | 1 refills | Status: DC | PRN

## 2021-03-03 ENCOUNTER — Telehealth: Payer: Self-pay | Admitting: *Deleted

## 2021-03-03 NOTE — Chronic Care Management (AMB) (Signed)
  Care Management   Note  03/03/2021 Name: ELLYSE ROTOLO MRN: [id number] DOB: Jan 31, 1941  Kristine ADELIA BAPTISTA is a 80 y.o. year old female who is a primary care patient of Lucious Groves, DO. I reached out to Theadora Rama by phone today in response to a referral sent by Ms. Grubbs PCP, Lucious Groves, DO.  Ms. Yniguez was given information about care management services today including:  1. Care management services include personalized support from designated clinical staff supervised by her physician, including individualized plan of care and coordination with other care providers 2. 24/7 contact phone numbers for assistance for urgent and routine care needs. 3. The patient may stop care management services at any time by phone call to the office staff.  Confirmed by son Geraldo Docker DPR on file  verbally agreed to assistance and services provided by embedded care coordination/care management team today.  Follow up plan: Telephone appointment with care management team member scheduled for:03/09/2021  Dunia Pringle  Care Guide, Embedded Care Coordination   Care Management  Direct Dial: [phone]

## 2021-03-09 ENCOUNTER — Ambulatory Visit: Payer: Medicare Other | Admitting: Licensed Clinical Social Worker

## 2021-03-09 NOTE — Patient Instructions (Signed)
Visit Information  Instructions: patient will work with SW to address concerns related to level of care concers.  Patient was given the following information about care management and care coordination services today, agreed to services, and gave verbal consent: 1.care management/care coordination services include personalized support from designated clinical staff supervised by their physician, including individualized plan of care and coordination with other care providers 2. 24/7 contact phone numbers for assistance for urgent and routine care needs. 3. The patient may stop care management/care coordination services at any time by phone call to the office staff.  Patient verbalizes understanding of instructions provided today and agrees to view in [place].   Telephone follow up appointment with care management team member scheduled for: Within the next 30 days.  [name], [place]  Social Worker [facility] Care Management  [phone]

## 2021-03-09 NOTE — Chronic Care Management (AMB) (Signed)
  Care Management   Social Work Visit Note  03/09/2021 Name: Stephanie Horne MRN: [id number] DOB: 1941/05/23  Stephanie Horne is a 80 y.o. year old female who sees Lucious Groves, DO for primary care. The care management team was consulted for assistance with care management and care coordination needs related to Level of Care Concerns and Hospice/Palliative Care Services Education   Patient was given the following information about care management and care coordination services today, agreed to services, and gave verbal consent: 1.care management/care coordination services include personalized support from designated clinical staff supervised by their physician, including individualized plan of care and coordination with other care providers 2. 24/7 contact phone numbers for assistance for urgent and routine care needs. 3. The patient may stop care management/care coordination services at any time by phone call to the office staff.  Engaged with patient by telephone for initial visit in response to provider referral for social work chronic care management and care coordination services.  Assessment: Review of patient history, allergies, and health status during evaluation of patient need for care management/care coordination services.    Interventions:  . Patient interviewed and appropriate assessments performed . Collaborated with clinical team regarding patient needs  . SW spoke with [name] ( daughter). Ms. Tamala Julian states the patient ( mother) needs assistance with her vitals being monitored. Ms. Tamala Julian advised the patient declines going to her medical appointments and declines assistance with personal care services. Ms. Tamala Julian states her mother has been denied multiple times for PCS due to being over the income.  . Ms. Tamala Julian stated the patients diabetes have gone untreated for over a year. . SW completed SDOH screening and unable to determine any additional needs.  . SW collaborated with RN ,  Kelli Churn for patients medical needs.  SDOH (Social Determinants of Health) assessments performed: Yes     Plan:  . patient will work with BSW to address needs related to Level of care concerns . Social Worker will follow up within 30 days.[name], [place]  Social Worker [facility] Care Management  [phone]

## 2021-03-11 ENCOUNTER — Telehealth: Payer: Self-pay | Admitting: *Deleted

## 2021-03-11 NOTE — Chronic Care Management (AMB) (Signed)
  Care Management   Note  03/11/2021 Name: Stephanie Horne MRN: [id number] DOB: [birth year]  [name] is a 80 y.o. year old female who is a primary care patient of Lucious Groves, DO. I reached out to Theadora Rama by phone today in response to a referral sent by Ms. [place] PCP, Lucious Groves, DO.    [name] was given information about care management services today including:  Care management services include personalized support from designated clinical staff supervised by her physician, including individualized plan of care and coordination with other care providers 24/7 contact phone numbers for assistance for urgent and routine care needs. The patient may stop care management services at any time by phone call to the office staff.  Daughter [name] DPR on file  verbally agreed to assistance and services provided by embedded care coordination/care management team today.  Follow up plan: Telephone appointment with care management team member scheduled for:03/16/2021  [place] Management

## 2021-03-16 ENCOUNTER — Ambulatory Visit: Payer: Medicare Other

## 2021-03-16 NOTE — Patient Instructions (Signed)
Visit Information   Goals Addressed               This Visit's Progress     [facility]) Improve My Heart Health (pt-stated)   Not on track     Follow Up Date 06/16/21   - be open to making changes - if I have chest pain, call for help    Why is this important?   Lifestyle changes are key to improving the blood flow to your heart. Think about the things you can change and set a goal to live healthy.  Remember, when the blood vessels to your heart start to get clogged you may not have any symptoms.  Over time, they can get worse.  Don't ignore the signs, like chest pain, and get help right away.     Notes:        [facility]) Monitor and Manage My Blood Sugar (pt-stated)   Not on track     Follow Up Date 06/16/21   - check blood sugar at prescribed times - check blood sugar if I feel it is too high or too low    Why is this important?   Checking your blood sugar at home helps to keep it from getting very high or very low.  Writing the results in a diary or log helps the doctor know how to care for you.  Your blood sugar log should have the time, date and the results.  Also, write down the amount of insulin or other medicine that you take.  Other information, like what you ate, exercise done and how you were feeling, will also be helpful.     Notes:          The patient verbalized understanding of instructions, educational materials, and care plan provided today and declined offer to receive copy of patient instructions, educational materials, and care plan.   Telephone follow up appointment with care management team member scheduled for: 2 weeks  Johnney Killian, RN, BSN, CCM Care Management Coordinator [facility] Internal Medicine Phone: [phone] / Fax: [phone]

## 2021-03-16 NOTE — Chronic Care Management (AMB) (Signed)
Care Management    RN Visit Note  03/16/2021 Name: Stephanie Horne MRN: [id number] DOB: 08/30/1941  Subjective: Stephanie Horne is a 80 y.o. year old female who is a primary care patient of Lucious Groves, DO. The care management team was consulted for assistance with disease management and care coordination needs.    Engaged with patient by telephone for follow up visit in response to provider referral for case management and/or care coordination services.   Consent to Services:   Ms. Coonrod was given information about Care Management services today including:  Care Management services includes personalized support from designated clinical staff supervised by her physician, including individualized plan of care and coordination with other care providers 24/7 contact phone numbers for assistance for urgent and routine care needs. The patient may stop case management services at any time by phone call to the office staff.  Patient agreed to services and consent obtained.    Assessment:  Patient has refused to come and see PCP. Referred to [facility] . See Care Plan below for interventions and patient self-care actives. Follow up Plan: Patient would like continued follow-up.  CCM RNCM will outreach the patient within the next 2 weeks.  Patient will call office if needed prior to next encounter Review of patient past medical history, allergies, medications, health status, including review of consultants reports, laboratory and other test data, was performed as part of comprehensive evaluation and provision of chronic care management services.   SDOH (Social Determinants of Health) assessments and interventions performed:    Care Plan  Allergies  Allergen Reactions   Metformin And Related     Diarrhea that was felt to be 2/2 metformin so trial of it was attempted.   Lisinopril     cough   Pollen Extract Other (See Comments)    Congestion, runny nose, phlegm     Outpatient Encounter Medications as of 03/16/2021  Medication Sig   apixaban (ELIQUIS) 5 MG TABS tablet Take 1 tablet (5 mg total) by mouth 2 (two) times daily.   Calcium Carbonate-Vitamin D (CALTRATE 600+D) 600-400 MG-UNIT per tablet Take 1 tablet by mouth 3 (three) times daily with meals. (Patient taking differently: Take 1 tablet by mouth daily.)   donepezil (ARICEPT) 10 MG tablet Take 1 tablet (10 mg total) by mouth at bedtime.   furosemide (LASIX) 40 MG tablet TAKE 1 TABLET (40 MG TOTAL) BY MOUTH DAILY.   levocetirizine (XYZAL) 5 MG tablet TAKE ONE-HALF TABLET BY MOUTH EVERY EVENING   memantine (NAMENDA) 5 MG tablet TAKE 1 TABLET BY MOUTH EVERY DAY   metoprolol tartrate (LOPRESSOR) 50 MG tablet TAKE 1 TABLET (50 MG TOTAL) BY MOUTH 2 (TWO) TIMES DAILY.   mirtazapine (REMERON) 15 MG tablet Take 0.5 tablets (7.5 mg total) by mouth at bedtime.   omeprazole (PRILOSEC) 20 MG capsule Take 1 capsule (20 mg total) by mouth daily.   potassium chloride SA (KLOR-CON) 20 MEQ tablet Take 1 tablet (20 mEq total) by mouth daily.   pravastatin (PRAVACHOL) 40 MG tablet Take 1 tablet (40 mg total) by mouth daily.   predniSONE (DELTASONE) 5 MG tablet Take 1.5 tablets (7.5 mg total) by mouth daily.   traZODone (DESYREL) 50 MG tablet Take 1 tablet (50 mg total) by mouth at bedtime as needed for sleep.   ferrous sulfate 325 (65 FE) MG tablet Take 325 mg by mouth every other day.   glucose blood test strip USE TO chech blood sugar UP  TO TWICE DAILY (Patient not taking: Reported on 03/16/2021)   ibuprofen (ADVIL) 200 MG tablet Take 200 mg by mouth every 6 (six) hours as needed.   Loperamide HCl (IMODIUM A-D PO) Take 1 tablet by mouth daily as needed.   nitroGLYCERIN (NITROSTAT) 0.4 MG SL tablet Place 1 tablet (0.4 mg total) under the tongue every 5 (five) minutes as needed for chest pain.   ONETOUCH DELICA LANCETS 44Y MISC USE TO check UP TO TWICE DAILY E119 (Patient not taking: Reported on 03/16/2021)    PNEUMOVAX 23 25 MCG/0.5ML injection    Polyethyl Glycol-Propyl Glycol (SYSTANE OP) Place 1 drop into both eyes daily.   sodium chloride (OCEAN) 0.65 % SOLN nasal spray Place 1 spray into both nostrils as needed for congestion.   [DISCONTINUED] Calcium Carbonate-Vitamin D (CALTRATE 600+D PO) Take 600 mg by mouth daily.   No facility-administered encounter medications on file as of 03/16/2021.    Patient Active Problem List   Diagnosis Date Noted   Need for immunization against influenza 07/10/2020   Iron deficiency anemia 03/09/2020   Adult BMI <19 kg/sq m 11/29/2019   Normocytic anemia 11/29/2019   Venous ulcer of ankle, right ([place]) 11/29/2019   Weight loss 05/16/2019   Frequent falls 08/09/2018   Gait instability 05/10/2018   Autoimmune hemolytic anemia ([place]) 01/04/2018   Hearing loss 11/23/2017   Coronary artery disease, non-occlusive 11/23/2017   Alzheimer's dementia without behavioral disturbance ([place]) 11/23/2017   Heart failure due to valvular disease, chronic, diastolic ([place]) 18/56/3149   Abdominal aortic atherosclerosis ([place]) 04/11/2017   Allergic rhinitis 05/07/2015   Pulmonary hypertension ([place]) 10/09/2014   Severe mitral regurgitation 10/09/2014   Osteoporosis of forearm without pathological fracture 05/29/2013   Insomnia 05/29/2013   Subacute cough 02/22/2012   Vitamin D deficiency 06/30/2011   Diarrhea 01/28/2011   Controlled type 2 diabetes mellitus ([place]) 02/27/2008   History of autoimmune hemolytic anemia 10/12/2007   Atrial fibrillation ([place]) 10/24/2006   Essential hypertension 08/02/2006   Low back pain 08/02/2006    Conditions to be addressed/monitored: CAD, HTN, and DMII  Plan: Telephone follow up appointment with care management team member scheduled for:  2 weeks  Johnney Killian, RN, BSN, CCM Care Management Coordinator [facility] Phone: [phone] / Fax: [phone]

## 2021-03-31 ENCOUNTER — Telehealth: Payer: Self-pay | Admitting: *Deleted

## 2021-03-31 NOTE — Chronic Care Management (AMB) (Signed)
  Care Management   Note  03/31/2021 Name: Stephanie Horne MRN: [id number] DOB: 1941-01-09  Stephanie Horne is a 80 y.o. year old female who is a primary care patient of Lucious Groves, DO and is actively engaged with the care management team. I reached out to Theadora Rama by phone today to assist with scheduling a follow up visit with the RN Case Manager  Follow up plan: Unsuccessful telephone outreach attempt made. A HIPAA compliant phone message was left for the patient providing contact information and requesting a return call. The care management team will reach out to the patient again over the next 7 days. If patient returns call to provider office, please advise to call [place] at [phone].  [place] Management

## 2021-04-06 ENCOUNTER — Encounter: Payer: Self-pay | Admitting: *Deleted

## 2021-04-06 NOTE — Chronic Care Management (AMB) (Signed)
  Care Management   Note  04/06/2021 Name: BETINA PUCKETT MRN: [id number] DOB: 12-08-1940  Quin TANGEE MARSZALEK is a 80 y.o. year old female who is a primary care patient of Lucious Groves, DO and is actively engaged with the care management team. I reached out to Theadora Rama by phone today to assist with scheduling a follow up visit with the RN Case Manager  Follow up plan: Telephone appointment with care management team member scheduled for:04/14/2021  [place] Management

## 2021-04-14 ENCOUNTER — Ambulatory Visit: Payer: Medicare Other

## 2021-04-14 NOTE — Patient Instructions (Signed)
Visit Information   Goals Addressed   Make appointment for yearly exam with PCP     The patient verbalized understanding of instructions, educational materials, and care plan provided today and declined offer to receive copy of patient instructions, educational materials, and care plan.   Telephone follow up appointment with care management team member scheduled for:05/22/2021  Johnney Killian, RN, BSN, CCM Care Management Coordinator [facility] Phone: [phone] / Fax: [phone]

## 2021-04-14 NOTE — Chronic Care Management (AMB) (Signed)
Care Management    RN Visit Note  04/14/2021 Name: Stephanie Horne MRN: [id number] DOB: January 30, 1941  Subjective: Stephanie Horne is a 80 y.o. year old female who is a primary care patient of Stephanie Groves, DO. The care management team was consulted for assistance with disease management and care coordination needs.    Engaged with patient by telephone for follow up visit in response to provider referral for case management and/or care coordination services.   Consent to Services:   [name] was given information about Care Management services today including:  Care Management services includes personalized support from designated clinical staff supervised by her physician, including individualized plan of care and coordination with other care providers 24/7 contact phone numbers for assistance for urgent and routine care needs. The patient may stop case management services at any time by phone call to the office staff.  Patient agreed to services and consent obtained.    Assessment: Patient is making progress with checking blood sugars . See Care Plan below for interventions and patient self-care actives. Follow up Plan: Patient would like continued follow-up.  CCM RNCM will outreach the patient within the next 30 days.  Patient will call office if needed prior to next encounter : Review of patient past medical history, allergies, medications, health status, including review of consultants reports, laboratory and other test data, was performed as part of comprehensive evaluation and provision of chronic care management services.   SDOH (Social Determinants of Health) assessments and interventions performed:    Care Plan  Allergies  Allergen Reactions   Metformin And Related     Diarrhea that was felt to be 2/2 metformin so trial of it was attempted.   Lisinopril     cough   Pollen Extract Other (See Comments)    Congestion, runny nose, phlegm    Outpatient Encounter  Medications as of 04/14/2021  Medication Sig   apixaban (ELIQUIS) 5 MG TABS tablet Take 1 tablet (5 mg total) by mouth 2 (two) times daily.   Calcium Carbonate-Vitamin D (CALTRATE 600+D) 600-400 MG-UNIT per tablet Take 1 tablet by mouth 3 (three) times daily with meals. (Patient taking differently: Take 1 tablet by mouth daily.)   donepezil (ARICEPT) 10 MG tablet Take 1 tablet (10 mg total) by mouth at bedtime.   ferrous sulfate 325 (65 FE) MG tablet Take 325 mg by mouth every other day.   furosemide (LASIX) 40 MG tablet TAKE 1 TABLET (40 MG TOTAL) BY MOUTH DAILY.   glucose blood test strip USE TO chech blood sugar UP TO TWICE DAILY (Patient not taking: Reported on 03/16/2021)   ibuprofen (ADVIL) 200 MG tablet Take 200 mg by mouth every 6 (six) hours as needed.   levocetirizine (XYZAL) 5 MG tablet TAKE ONE-HALF TABLET BY MOUTH EVERY EVENING   Loperamide HCl (IMODIUM A-D PO) Take 1 tablet by mouth daily as needed.   memantine (NAMENDA) 5 MG tablet TAKE 1 TABLET BY MOUTH EVERY DAY   metoprolol tartrate (LOPRESSOR) 50 MG tablet TAKE 1 TABLET (50 MG TOTAL) BY MOUTH 2 (TWO) TIMES DAILY.   mirtazapine (REMERON) 15 MG tablet Take 0.5 tablets (7.5 mg total) by mouth at bedtime.   nitroGLYCERIN (NITROSTAT) 0.4 MG SL tablet Place 1 tablet (0.4 mg total) under the tongue every 5 (five) minutes as needed for chest pain.   omeprazole (PRILOSEC) 20 MG capsule Take 1 capsule (20 mg total) by mouth daily.   ONETOUCH DELICA LANCETS 28N MISC USE  TO check UP TO TWICE DAILY E119 (Patient not taking: Reported on 03/16/2021)   PNEUMOVAX 23 25 MCG/0.5ML injection    Polyethyl Glycol-Propyl Glycol (SYSTANE OP) Place 1 drop into both eyes daily.   potassium chloride SA (KLOR-CON) 20 MEQ tablet Take 1 tablet (20 mEq total) by mouth daily.   pravastatin (PRAVACHOL) 40 MG tablet Take 1 tablet (40 mg total) by mouth daily.   predniSONE (DELTASONE) 5 MG tablet Take 1.5 tablets (7.5 mg total) by mouth daily.   sodium chloride  (OCEAN) 0.65 % SOLN nasal spray Place 1 spray into both nostrils as needed for congestion.   traZODone (DESYREL) 50 MG tablet Take 1 tablet (50 mg total) by mouth at bedtime as needed for sleep.   [DISCONTINUED] Calcium Carbonate-Vitamin D (CALTRATE 600+D PO) Take 600 mg by mouth daily.   No facility-administered encounter medications on file as of 04/14/2021.    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[place]) 01/04/2018   Hearing loss 11/23/2017   Coronary artery disease, non-occlusive 11/23/2017   Alzheimer's dementia without behavioral disturbance ([place]) 11/23/2017   Heart failure due to valvular disease, chronic, diastolic ([place]) 38/17/7116   Abdominal aortic atherosclerosis ([place]) 04/11/2017   Allergic rhinitis 05/07/2015   Pulmonary hypertension ([place]) 10/09/2014   Severe mitral regurgitation 10/09/2014   Osteoporosis of forearm without pathological fracture 05/29/2013   Insomnia 05/29/2013   Subacute cough 02/22/2012   Vitamin D deficiency 06/30/2011   Diarrhea 01/28/2011   Controlled type 2 diabetes mellitus ([place]) 02/27/2008   History of autoimmune hemolytic anemia 10/12/2007   Atrial fibrillation ([place]) 10/24/2006   Essential hypertension 08/02/2006   Low back pain 08/02/2006    Conditions to be addressed/monitored: CAD, HTN, and DMII  Care Plan : Coronary Artery Disease (Adult)  Updates made by Johnney Killian, RN since 04/14/2021 12:00 AM     Problem: Disease Progression (Coronary Artery Disease)      Long-Range Goal: Monitor Disease progression   Start Date: 03/16/2021  Expected End Date: 07/16/2021  Recent Progress: Not on track  Priority:  High  Note:   Current Barriers:  Knowledge deficits related to self health management of DM, CAD- Initially spoke with patients daughter who feels her Mom is doing well.  The only issue she seems to be having is excessive saliva.  When this RNCM asked about her blood sugar readings, [name] told me to call her Mother as she has all of that information.  When I called the number on file for patient, Her Son Elenore Rota answered. Both Elenore Rota and [name] help  patient.  Elenore Rota provided the home number and I reached the patient.  Ms. Finkbiner feels she is doing well, she is able to take care of her needs and her kids assist as needed.  Patient shared her blood sugars are always 130ish when she takes it.  She noted it does not seem to alter much.   Chronic Disease Management support and education needs related to the importance of making regular doctor appointments Financial Constraints.  Non-adherence to scheduled provider appointments Cognitive Deficits-Patient has a history for dementia and per daughter, is currently doing well. Does not adhere to provider  recommendations re:  Does not attend all scheduled provider appointments Does not maintain contact with provider office Nurse Case Manager Clinical Goal(s): 30-60 days patient will take all medications exactly as prescribed and will call provider for medication related questions patient will verbalize understanding of cardiovascular symptoms and when to call doctor Patient or her daughter will call and make appt with PCP- Discussed with patient the fact she has not seen her PCP since 07/2020 and she needs to schedule an appointment to come into clinic.  Explained that she needs to be seen to continue having her medications refilled and if she wanted to continue with care coordination, she needs to see PCP. Interventions:  Collaboration with Stephanie Groves, DO regarding development and update of comprehensive plan of care as evidenced by provider attestation and  co-signature Inter-disciplinary care team collaboration (see longitudinal plan of care) Basic overview and discussion of CAD- Discussed with daughter the importance of monitoring Blood Pressures.   Medications reviewed- Per daughter her Mom has no difficulty taking her medications as prescribed. Perform nutrition assessment and develop plan to focus on health and weight management- Patient needs to have weight checked when she comes for PCP visits. Collaborated with [facility] regarding setting up nursing visits for vital sign monitoring. Patient Goals/Self-Care Activities:  Patient to follow up with PCP  Follow Up Plan: Telephone follow up appointment with care management team member scheduled for: 4 weeks     Plan: Telephone follow up appointment with care management team member scheduled for:  05/12/21@2 :[place], RN, BSN, CCM Care Management Coordinator [facility] Phone: [phone] / Fax: [phone]

## 2021-04-15 ENCOUNTER — Other Ambulatory Visit: Payer: Self-pay

## 2021-04-15 DIAGNOSIS — I10 Essential (primary) hypertension: Secondary | ICD-10-CM

## 2021-04-15 MED ORDER — PREDNISONE 5 MG PO TABS: 7.5000 mg | ORAL_TABLET | Freq: Every day | ORAL | 1 refills | Status: AC

## 2021-04-15 MED ORDER — FUROSEMIDE 40 MG PO TABS: ORAL_TABLET | ORAL | 1 refills | Status: DC

## 2021-04-15 NOTE — Telephone Encounter (Signed)
Pt daughter is requesting her predniSONE (DELTASONE) 5 MG tablet , traZODone (DESYREL) 50 MG tablet ,   furosemide (LASIX) 40 MG tablet , omeprazole (PRILOSEC) 20 MG capsule sent to  [facility] [place] Phone:  [phone]  Fax:  [phone]

## 2021-04-19 ENCOUNTER — Other Ambulatory Visit: Payer: Self-pay | Admitting: *Deleted

## 2021-04-19 MED ORDER — OMEPRAZOLE 20 MG PO CPDR: 20.0000 mg | DELAYED_RELEASE_CAPSULE | Freq: Every day | ORAL | 1 refills | Status: AC

## 2021-04-19 MED ORDER — TRAZODONE HCL 50 MG PO TABS: 50.0000 mg | ORAL_TABLET | Freq: Every evening | ORAL | 1 refills | Status: AC | PRN

## 2021-04-28 ENCOUNTER — Other Ambulatory Visit: Payer: Self-pay | Admitting: Internal Medicine

## 2021-04-28 ENCOUNTER — Ambulatory Visit: Payer: Self-pay | Admitting: Licensed Clinical Social Worker

## 2021-04-28 ENCOUNTER — Telehealth: Payer: Self-pay

## 2021-04-28 ENCOUNTER — Inpatient Hospital Stay (HOSPITAL_COMMUNITY)
Admission: EM | Admit: 2021-04-28 | Discharge: 2021-05-03 | DRG: 947 | Disposition: A | Discharge status: Skilled Nursing Facility | Payer: Medicare Other | Attending: Student in an Organized Health Care Education/Training Program | Admitting: Student in an Organized Health Care Education/Training Program

## 2021-04-28 ENCOUNTER — Telehealth: Payer: Self-pay | Admitting: *Deleted

## 2021-04-28 ENCOUNTER — Emergency Department (HOSPITAL_COMMUNITY): Payer: Medicare Other

## 2021-04-28 ENCOUNTER — Other Ambulatory Visit: Payer: Self-pay

## 2021-04-28 ENCOUNTER — Encounter: Payer: Medicare Other | Admitting: Student

## 2021-04-28 DIAGNOSIS — G309 Alzheimer's disease, unspecified: Secondary | ICD-10-CM | POA: Diagnosis present

## 2021-04-28 DIAGNOSIS — Z833 Family history of diabetes mellitus: Secondary | ICD-10-CM

## 2021-04-28 DIAGNOSIS — Z681 Body mass index (BMI) 19 or less, adult: Secondary | ICD-10-CM

## 2021-04-28 DIAGNOSIS — Z7901 Long term (current) use of anticoagulants: Secondary | ICD-10-CM

## 2021-04-28 DIAGNOSIS — R296 Repeated falls: Secondary | ICD-10-CM

## 2021-04-28 DIAGNOSIS — E11649 Type 2 diabetes mellitus with hypoglycemia without coma: Secondary | ICD-10-CM | POA: Diagnosis not present

## 2021-04-28 DIAGNOSIS — E43 Unspecified severe protein-calorie malnutrition: Secondary | ICD-10-CM | POA: Insufficient documentation

## 2021-04-28 DIAGNOSIS — K921 Melena: Secondary | ICD-10-CM | POA: Diagnosis not present

## 2021-04-28 DIAGNOSIS — F0280 Dementia in other diseases classified elsewhere without behavioral disturbance: Secondary | ICD-10-CM | POA: Diagnosis present

## 2021-04-28 DIAGNOSIS — Z79899 Other long term (current) drug therapy: Secondary | ICD-10-CM

## 2021-04-28 DIAGNOSIS — S9002XA Contusion of left ankle, initial encounter: Secondary | ICD-10-CM

## 2021-04-28 DIAGNOSIS — R531 Weakness: Principal | ICD-10-CM

## 2021-04-28 DIAGNOSIS — I4891 Unspecified atrial fibrillation: Secondary | ICD-10-CM | POA: Diagnosis present

## 2021-04-28 DIAGNOSIS — I5032 Chronic diastolic (congestive) heart failure: Secondary | ICD-10-CM | POA: Diagnosis present

## 2021-04-28 DIAGNOSIS — M329 Systemic lupus erythematosus, unspecified: Secondary | ICD-10-CM | POA: Diagnosis present

## 2021-04-28 DIAGNOSIS — W19XXXA Unspecified fall, initial encounter: Secondary | ICD-10-CM | POA: Diagnosis present

## 2021-04-28 DIAGNOSIS — D509 Iron deficiency anemia, unspecified: Secondary | ICD-10-CM | POA: Diagnosis present

## 2021-04-28 DIAGNOSIS — U071 COVID-19: Secondary | ICD-10-CM | POA: Diagnosis present

## 2021-04-28 DIAGNOSIS — Z91048 Other nonmedicinal substance allergy status: Secondary | ICD-10-CM

## 2021-04-28 DIAGNOSIS — D591 Autoimmune hemolytic anemia, unspecified: Secondary | ICD-10-CM | POA: Diagnosis present

## 2021-04-28 DIAGNOSIS — I110 Hypertensive heart disease with heart failure: Secondary | ICD-10-CM | POA: Diagnosis present

## 2021-04-28 DIAGNOSIS — G9340 Encephalopathy, unspecified: Secondary | ICD-10-CM

## 2021-04-28 DIAGNOSIS — J1282 Pneumonia due to coronavirus disease 2019: Secondary | ICD-10-CM

## 2021-04-28 DIAGNOSIS — Z888 Allergy status to other drugs, medicaments and biological substances status: Secondary | ICD-10-CM

## 2021-04-28 DIAGNOSIS — S93402A Sprain of unspecified ligament of left ankle, initial encounter: Secondary | ICD-10-CM | POA: Diagnosis present

## 2021-04-28 DIAGNOSIS — E860 Dehydration: Secondary | ICD-10-CM | POA: Diagnosis present

## 2021-04-28 DIAGNOSIS — R5381 Other malaise: Secondary | ICD-10-CM | POA: Diagnosis present

## 2021-04-28 DIAGNOSIS — M25532 Pain in left wrist: Secondary | ICD-10-CM | POA: Diagnosis present

## 2021-04-28 DIAGNOSIS — M419 Scoliosis, unspecified: Secondary | ICD-10-CM | POA: Diagnosis present

## 2021-04-28 LAB — CBC WITH DIFFERENTIAL/PLATELET
Abs Immature Granulocytes: 0.02 10*3/uL (ref 0.00–0.07)
Basophils Absolute: 0 10*3/uL (ref 0.0–0.1)
Basophils Relative: 0 %
Eosinophils Absolute: 0 10*3/uL (ref 0.0–0.5)
Eosinophils Relative: 1 %
HCT: 32.3 % — ABNORMAL LOW (ref 36.0–46.0)
Hemoglobin: 9.8 g/dL — ABNORMAL LOW (ref 12.0–15.0)
Immature Granulocytes: 0 %
Lymphocytes Relative: 30 %
Lymphs Abs: 1.5 10*3/uL (ref 0.7–4.0)
MCH: 31.4 pg (ref 26.0–34.0)
MCHC: 30.3 g/dL (ref 30.0–36.0)
MCV: 103.5 fL — ABNORMAL HIGH (ref 80.0–100.0)
Monocytes Absolute: 0.7 10*3/uL (ref 0.1–1.0)
Monocytes Relative: 13 %
Neutro Abs: 2.8 10*3/uL (ref 1.7–7.7)
Neutrophils Relative %: 56 %
Platelets: 215 10*3/uL (ref 150–400)
RBC: 3.12 MIL/uL — ABNORMAL LOW (ref 3.87–5.11)
RDW: 14.6 % (ref 11.5–15.5)
WBC: 5.1 10*3/uL (ref 4.0–10.5)
nRBC: 0 % (ref 0.0–0.2)

## 2021-04-28 LAB — COMPREHENSIVE METABOLIC PANEL
ALT: 22 U/L (ref 0–44)
AST: 52 U/L — ABNORMAL HIGH (ref 15–41)
Albumin: 3 g/dL — ABNORMAL LOW (ref 3.5–5.0)
Alkaline Phosphatase: 56 U/L (ref 38–126)
Anion gap: 7 (ref 5–15)
BUN: 6 mg/dL — ABNORMAL LOW (ref 8–23)
CO2: 23 mmol/L (ref 22–32)
Calcium: 8.9 mg/dL (ref 8.9–10.3)
Chloride: 105 mmol/L (ref 98–111)
Creatinine, Ser: 0.71 mg/dL (ref 0.44–1.00)
GFR, Estimated: >60 mL/min (ref >60)
Glucose, Bld: 112 mg/dL — ABNORMAL HIGH (ref 70–99)
Potassium: 4.2 mmol/L (ref 3.5–5.1)
Sodium: 135 mmol/L (ref 135–145)
Total Bilirubin: 0.9 mg/dL (ref 0.3–1.2)
Total Protein: 6.6 g/dL (ref 6.5–8.1)

## 2021-04-28 LAB — POC OCCULT BLOOD, ED: Fecal Occult Bld: NEGATIVE

## 2021-04-28 LAB — RESP PANEL BY RT-PCR (FLU A&B, COVID) ARPGX2
Influenza A by PCR: NEGATIVE
Influenza B by PCR: NEGATIVE
SARS Coronavirus 2 by RT PCR: POSITIVE — AB

## 2021-04-28 LAB — CBG MONITORING, ED: Glucose-Capillary: 102 mg/dL — ABNORMAL HIGH (ref 70–99)

## 2021-04-28 MED ORDER — LOPERAMIDE HCL 2 MG PO CAPS: 2.0000 mg | ORAL_CAPSULE | ORAL | Status: DC | PRN

## 2021-04-28 MED ORDER — DONEPEZIL HCL 10 MG PO TABS
10.0000 mg | ORAL_TABLET | Freq: Every day | ORAL | Status: DC
  Administered 2021-04-28 – 2021-05-02 (×4): 10 mg via ORAL
  Filled 2021-04-28 (×5): qty 1

## 2021-04-28 MED ORDER — ACETAMINOPHEN 325 MG PO TABS
650.0000 mg | ORAL_TABLET | Freq: Four times a day (QID) | ORAL | Status: DC | PRN
  Administered 2021-04-29: 650 mg via ORAL
  Filled 2021-04-28: qty 2

## 2021-04-28 MED ORDER — METOPROLOL TARTRATE 50 MG PO TABS
50.0000 mg | ORAL_TABLET | Freq: Two times a day (BID) | ORAL | Status: DC
  Administered 2021-04-28 – 2021-05-03 (×9): 50 mg via ORAL
  Filled 2021-04-28 (×7): qty 1
  Filled 2021-04-28: qty 2
  Filled 2021-04-28 (×2): qty 1

## 2021-04-28 MED ORDER — FUROSEMIDE 40 MG PO TABS
40.0000 mg | ORAL_TABLET | Freq: Every day | ORAL | Status: DC
  Administered 2021-04-29 – 2021-05-03 (×5): 40 mg via ORAL
  Filled 2021-04-28 (×5): qty 1

## 2021-04-28 MED ORDER — INSULIN ASPART 100 UNIT/ML IJ SOLN
0.0000 [IU] | Freq: Three times a day (TID) | INTRAMUSCULAR | Status: DC
  Administered 2021-04-29: 1 [IU] via SUBCUTANEOUS

## 2021-04-28 MED ORDER — PRAVASTATIN SODIUM 40 MG PO TABS
40.0000 mg | ORAL_TABLET | Freq: Every day | ORAL | Status: DC
  Administered 2021-04-29 – 2021-05-03 (×5): 40 mg via ORAL
  Filled 2021-04-28 (×5): qty 1

## 2021-04-28 MED ORDER — ACETAMINOPHEN 325 MG PO TABS: 650.0000 mg | ORAL_TABLET | Freq: Once | ORAL | Status: DC

## 2021-04-28 MED ORDER — PREDNISONE 5 MG PO TABS
7.5000 mg | ORAL_TABLET | Freq: Every day | ORAL | Status: DC
  Administered 2021-04-28 – 2021-05-03 (×5): 7.5 mg via ORAL
  Filled 2021-04-28 (×4): qty 2
  Filled 2021-04-28: qty 1
  Filled 2021-04-28: qty 2

## 2021-04-28 MED ORDER — LACTATED RINGERS IV SOLN: INTRAVENOUS | Status: AC

## 2021-04-28 MED ORDER — MIRTAZAPINE 15 MG PO TABS
15.0000 mg | ORAL_TABLET | Freq: Every day | ORAL | Status: DC
  Administered 2021-04-28 – 2021-05-02 (×4): 15 mg via ORAL
  Filled 2021-04-28 (×5): qty 1

## 2021-04-28 MED ORDER — SODIUM CHLORIDE 0.9 % IV BOLUS
500.0000 mL | Freq: Once | INTRAVENOUS | Status: AC
  Administered 2021-04-28: 500 mL via INTRAVENOUS

## 2021-04-28 MED ORDER — ACETAMINOPHEN 650 MG RE SUPP: 650.0000 mg | Freq: Four times a day (QID) | RECTAL | Status: DC | PRN

## 2021-04-28 MED ORDER — MEMANTINE HCL 10 MG PO TABS
5.0000 mg | ORAL_TABLET | Freq: Every day | ORAL | Status: DC
  Administered 2021-04-28 – 2021-05-03 (×6): 5 mg via ORAL
  Filled 2021-04-28 (×6): qty 1

## 2021-04-28 MED ORDER — APIXABAN 5 MG PO TABS
5.0000 mg | ORAL_TABLET | Freq: Two times a day (BID) | ORAL | Status: DC
  Administered 2021-04-28 – 2021-04-29 (×2): 5 mg via ORAL
  Filled 2021-04-28 (×2): qty 1

## 2021-04-28 MED ORDER — TRAZODONE HCL 50 MG PO TABS
50.0000 mg | ORAL_TABLET | Freq: Every evening | ORAL | Status: DC | PRN
  Administered 2021-04-29 – 2021-05-02 (×2): 50 mg via ORAL
  Filled 2021-04-28 (×4): qty 1

## 2021-04-28 MED ORDER — PANTOPRAZOLE SODIUM 40 MG PO TBEC
40.0000 mg | DELAYED_RELEASE_TABLET | Freq: Every day | ORAL | Status: DC
  Administered 2021-04-29 – 2021-05-03 (×5): 40 mg via ORAL
  Filled 2021-04-28 (×5): qty 1

## 2021-04-28 NOTE — ED Provider Notes (Signed)
[facility] EMERGENCY DEPARTMENT Provider Note   CSN: [id number] Arrival date & time: 04/28/21  1142     History Chief Complaint  Patient presents with   Fall    Stephanie Horne is a 80 y.o. female.  Patient presents with gradually worsening weakness and frequent falls the past few weeks.  Patient's been staying at home and her son has been taking care of her.  Her daughter has cameras set up also to keep an INR in her house.  Patient's had a couple falls last week and 2 falls this week.  No fevers or infectious symptoms except for mild cough recently.  Patient is on Eliquis.  Patient is fall and hit her left wrist, left hip, left ankle unsure of head.  Patient normally leans towards right side with her scoliosis history.  Unable to get detailed history from patient due to confusion.      No past medical history on file.  There are no problems to display for this patient.    OB History   No obstetric history on file.     No family history on file.     Home Medications Prior to Admission medications   Not on File    Allergies    Patient has no allergy information on record.  Review of Systems   Review of Systems  Unable to perform ROS: Age   Physical Exam Updated Vital Signs BP 130/83   Pulse 88   Temp 99.8 F (37.7 C) (Rectal)   Resp 20   Ht '5\' 11"'$  (1.803 m)   Wt 45.4 kg   SpO2 93%   BMI 13.95 kg/m   Physical Exam Vitals and nursing note reviewed.  Constitutional:      General: She is not in acute distress.    Appearance: She is well-developed.  HENT:     Head: Normocephalic.     Mouth/Throat:     Mouth: Mucous membranes are dry.  Eyes:     General:        Right eye: No discharge.        Left eye: No discharge.     Conjunctiva/sclera: Conjunctivae normal.  Neck:     Trachea: No tracheal deviation.  Cardiovascular:     Rate and Rhythm: Normal rate and regular rhythm.     Heart sounds: No murmur heard. Pulmonary:      Effort: Pulmonary effort is normal.     Breath sounds: Normal breath sounds.  Abdominal:     General: There is no distension.     Palpations: Abdomen is soft.     Tenderness: There is no abdominal tenderness. There is no guarding.  Musculoskeletal:        General: Swelling and tenderness present.     Cervical back: Normal range of motion and neck supple. No rigidity.     Comments: Patient has mild swelling tenderness mid forearm on the left, left distal tibia and ankle and left lateral hip.  Patient has no tenderness in other major joints with flexion extension.  Swelling to left lower tibia.  No obvious midline cervical tenderness full range of motion head neck without pain.  Skin:    General: Skin is warm.     Capillary Refill: Capillary refill takes less than 2 seconds.     Findings: No rash.  Neurological:     Mental Status: She is alert. She is confused.     GCS: GCS eye subscore is 4. GCS  verbal subscore is 4. GCS motor subscore is 6.     Comments: Difficult neuro exam, patient will not directly answer questions.  Grossly patient will move all extremities equal bilateral general weakness.  Sensation intact bilateral.  Psychiatric:     Comments: confused    ED Results / Procedures / Treatments   Labs (all labs ordered are listed, but only abnormal results are displayed) Labs Reviewed  RESP PANEL BY RT-PCR (FLU A&B, COVID) ARPGX2 - Abnormal; Notable for the following components:      Result Value   SARS Coronavirus 2 by RT PCR POSITIVE (*)    All other components within normal limits  COMPREHENSIVE METABOLIC PANEL - Abnormal; Notable for the following components:   Glucose, Bld 112 (*)    BUN 6 (*)    Albumin 3.0 (*)    AST 52 (*)    All other components within normal limits  CBC WITH DIFFERENTIAL/PLATELET - Abnormal; Notable for the following components:   RBC 3.12 (*)    Hemoglobin 9.8 (*)    HCT 32.3 (*)    MCV 103.5 (*)    All other components within normal limits   URINALYSIS, ROUTINE W REFLEX MICROSCOPIC  POC OCCULT BLOOD, ED    EKG None  Radiology DG Forearm Left  Result Date: 04/28/2021 CLINICAL DATA:  Status post fall. EXAM: LEFT FOREARM - 2 VIEW COMPARISON:  None. FINDINGS: The bones appear diffusely osteopenic. No acute fracture or dislocation identified. There is marked narrowing of the joint spaces between the carpal bones. There is also narrowing of the radiocarpal joint and degenerative changes at the distal radioulnar joint. Negative ulnar variance identified. Calcifications within the joint space noted. IMPRESSION: 1. No acute findings. 2. Uniform joint space narrowing between the carpal bones with narrowing of the radiocarpal joint. Correlate for any clinical signs or symptoms of inflammatory arthropathy. 3. Chondrocalcinosis noted. Electronically Signed   By: Kerby Moors M.D.   On: 04/28/2021 14:05   DG Tibia/Fibula Left  Result Date: 04/28/2021 CLINICAL DATA:  Left lower leg pain after a fall. Initial encounter. EXAM: LEFT TIBIA AND FIBULA - 2 VIEW COMPARISON:  None. FINDINGS: No acute bony or joint abnormality is identified. Soft tissue calcifications in the posterior aspect of the calf are most consistent with some prior infectious or inflammatory process. IMPRESSION: No acute abnormality. Electronically Signed   By: Inge Rise M.D.   On: 04/28/2021 14:04   DG Ankle 2 Views Left  Result Date: 04/28/2021 CLINICAL DATA:  Left ankle pain after a fall.  Initial encounter. EXAM: LEFT ANKLE - 2 VIEW COMPARISON:  None. FINDINGS: There is no evidence of fracture, dislocation, or joint effusion. There is no evidence of arthropathy or other focal bone abnormality. Soft tissues about the ankle appear swollen. IMPRESSION: No acute bony abnormality. Soft tissue swelling about the ankle could be due to dependent change. Electronically Signed   By: Inge Rise M.D.   On: 04/28/2021 14:05   CT Head Wo Contrast  Result Date:  04/28/2021 CLINICAL DATA:  Multiple falls EXAM: CT HEAD WITHOUT CONTRAST CT CERVICAL SPINE WITHOUT CONTRAST TECHNIQUE: Multidetector CT imaging of the head and cervical spine was performed following the standard protocol without intravenous contrast. Multiplanar CT image reconstructions of the cervical spine were also generated. COMPARISON:  CT head 09/08/2012 FINDINGS: CT HEAD FINDINGS Brain: Generalized atrophy. Mild white matter hypodensity bilaterally Negative for acute infarct, acute hemorrhage, or mass lesion. Vascular: Negative for hyperdense vessel Skull: Negative for skull  fracture Sinuses/Orbits: Sclerotic changes in the right sphenoid sinus unchanged from prior study. Small air-fluid level left sphenoid sinus. Mucosal edema paranasal sinuses. Bilateral cataract extraction Other: None CT CERVICAL SPINE FINDINGS Alignment: Mild retrolisthesis C2-3, C3-4, C4-5, C5-6 Skull base and vertebrae: No fracture identified Soft tissues and spinal canal: No soft tissue mass or adenopathy in the neck. Disc levels: Advanced cervical disc degeneration. Multiple areas of central disc protrusion and uncinate spurring. Multilevel spinal and foraminal stenosis. Upper chest: Advanced apical emphysema.  No acute abnormality Other: None IMPRESSION: 1. No acute intracranial abnormality 2. Negative for cervical spine fracture.  Advanced spondylosis. Electronically Signed   By: Franchot Gallo M.D.   On: 04/28/2021 15:38   CT Cervical Spine Wo Contrast  Result Date: 04/28/2021 CLINICAL DATA:  Multiple falls EXAM: CT HEAD WITHOUT CONTRAST CT CERVICAL SPINE WITHOUT CONTRAST TECHNIQUE: Multidetector CT imaging of the head and cervical spine was performed following the standard protocol without intravenous contrast. Multiplanar CT image reconstructions of the cervical spine were also generated. COMPARISON:  CT head 09/08/2012 FINDINGS: CT HEAD FINDINGS Brain: Generalized atrophy. Mild white matter hypodensity bilaterally Negative  for acute infarct, acute hemorrhage, or mass lesion. Vascular: Negative for hyperdense vessel Skull: Negative for skull fracture Sinuses/Orbits: Sclerotic changes in the right sphenoid sinus unchanged from prior study. Small air-fluid level left sphenoid sinus. Mucosal edema paranasal sinuses. Bilateral cataract extraction Other: None CT CERVICAL SPINE FINDINGS Alignment: Mild retrolisthesis C2-3, C3-4, C4-5, C5-6 Skull base and vertebrae: No fracture identified Soft tissues and spinal canal: No soft tissue mass or adenopathy in the neck. Disc levels: Advanced cervical disc degeneration. Multiple areas of central disc protrusion and uncinate spurring. Multilevel spinal and foraminal stenosis. Upper chest: Advanced apical emphysema.  No acute abnormality Other: None IMPRESSION: 1. No acute intracranial abnormality 2. Negative for cervical spine fracture.  Advanced spondylosis. Electronically Signed   By: Franchot Gallo M.D.   On: 04/28/2021 15:38   DG Pelvis Portable  Result Date: 04/28/2021 CLINICAL DATA:  Falls, trauma EXAM: PORTABLE PELVIS 1-2 VIEWS COMPARISON:  CT 02/03/2016 FINDINGS: Examination is limited by patient rotation. No evidence of acute fracture or pelvic diastasis. Prominent lumbar scoliotic curvature is partially visualized. Vascular calcifications are present. IMPRESSION: No evidence of acute fracture or diastasis on rotated frontal view of the pelvis. Electronically Signed   By: Davina Poke D.O.   On: 04/28/2021 13:11   DG Chest Portable 1 View  Result Date: 04/28/2021 CLINICAL DATA:  Recurrent falls. EXAM: PORTABLE CHEST 1 VIEW COMPARISON:  PA and lateral chest 09/21/2017. FINDINGS: The lungs are clear. Marked cardiomegaly is again seen. Aortic atherosclerosis. No pneumothorax or pleural fluid. No acute bony abnormality. Marked convex right thoracolumbar scoliosis noted. IMPRESSION: No acute disease. Cardiomegaly. Scoliosis. Aortic Atherosclerosis (ICD10-I70.0). Electronically Signed    By: Inge Rise M.D.   On: 04/28/2021 13:08   DG Femur Min 2 Views Left  Result Date: 04/28/2021 CLINICAL DATA:  Left upper leg pain after a fall. Initial encounter. EXAM: LEFT FEMUR 2 VIEWS COMPARISON:  None. FINDINGS: There is no evidence of fracture or other focal bone lesions. Soft tissues are unremarkable. IMPRESSION: Negative exam. Electronically Signed   By: Inge Rise M.D.   On: 04/28/2021 14:06    Procedures Procedures   Medications Ordered in ED Medications  sodium chloride 0.9 % bolus 500 mL (0 mLs Intravenous Stopped 04/28/21 1401)    ED Course  I have reviewed the triage vital signs and the nursing notes.  Pertinent labs &  imaging results that were available during my care of the patient were reviewed by me and considered in my medical decision making (see chart for details).    MDM Rules/Calculators/A&P                           Patient presents with frequent falls and general weakness with melanotic stools the past 2 weeks.  Differential includes anemia, metabolic, urine infection, stroke, subdural, fractures, medication related, other.  Unable to get detailed history from patient.  Discussed with son on the phone and daughter in person to help guide treatment and management.  They are hopeful once she is stronger and the work-up in the hospital is done to have her back home with home health if possible.  Patient is very generally weak and will need admission and possible rehab placement depending on details of lab results and imaging.  X-rays reviewed no acute fractures. Hemoccult negative.  No active bleeding in the ER. Tylenol ordered for pain and low-grade temperature.  Left ankle support discussed with Ortho technician.  CT scan of the head delayed however results reviewed no acute fracture or bleeding.  COVID test returned positive.  General blood work reviewed hemoglobin mild anemia, electrolytes she is compensating well within normal limits.  Discussed  with internal medicine resident for admission.  Urinalysis pending nursing plan to recath. Discussed with social work for assistance as well.  Final Clinical Impression(s) / ED Diagnoses Final diagnoses:  Frequent falls  Contusion of left ankle, initial encounter  General weakness  Melena  Moderate left ankle sprain, initial encounter  Pneumonia due to COVID-19 virus  Acute encephalopathy    Rx / DC Orders ED Discharge Orders     None        Elnora Morrison, MD 04/28/21 1645

## 2021-04-28 NOTE — Progress Notes (Signed)
Orthopedic Tech Progress Note Patient Details:  Stephanie Horne 12/04/1940 [id number]  PER MD I was told to apply an AIR CAST instead of splint  Ortho Devices Type of Ortho Device: Ankle Air splint Ortho Device/Splint Location: LLE Ortho Device/Splint Interventions: Ordered, Application, Adjustment   Post Interventions Patient Tolerated: Well Instructions Provided: Care of device  Janit Pagan 04/28/2021, 2:52 PM

## 2021-04-28 NOTE — Telephone Encounter (Signed)
Good to hear EMS is there, she absolutely needs eval in the ED given fall, inability to bear weight, and her chronic steroid use!

## 2021-04-28 NOTE — H&P (Addendum)
Date: 04/28/2021               Patient Name:  Stephanie Horne MRN: [id number]  DOB: 1940-12-14 Age / Sex: 80 y.o., female   PCP: Lucious Groves, DO         Medical Service: Internal Medicine Teaching Service         Attending Physician: Dr. Sid Falcon, MD    First Contact: Dr. Lorin Glass Pager: [phone]  Second Contact: Dr. Laural Golden Pager: [phone]       After Hours (After 5p/  First Contact Pager: [phone]  weekends / holidays): Second Contact Pager: [phone]   Chief Complaint: frequent falls  History of Present Illness: Stephanie Horne is a 56 y.o. COVID + female with a PMHx of iron deficiency anemia, AIHA in 2008 on prednisone, alzheimer's dementia, HTN, diastolic heart failure, lupus, T2DM who presents here today after sustaining frequent falls in the past few days. History obtained from patient and her son.   Per the patient's son, she has been sustaining numerous falls at home. She fell twice last night and has sustained about 8 falls in the past 5 days. Her son reports that she has not eaten in a few days and has only been sipping liquids, maybe a cup of water a day.   She sustained an injury to her left ankle last night and to her left wrist 3 days ago. Her son states she has not lost consciousness as far as he knows. When she fell last, he states that she was down on the floor for a few hours before being found. He does not think she hit her head when she fell.   Her son states that she has been unable to stand or bear weight lately due to weakness. Son notices she does not have the strength to hold herself up. She uses a walker to get around but lately has been unable to get up on her own. She was able to get up on her own about 5 days ago.   He states she is weak because she is not eating enough. About 10 days ago, she was eating well but then was only eating twice a day and then barely once day. Patient eats breakfast and lunch but does not eat dinner. Son thinks this is  due to her trying to prevent her from waking up for the bathroom overnight.   She does have constant diarrhea, black stools. However, patient refuses colonoscopy per son. She takes imodium regularly. Denies headache, chest pain, SOB. No dysuria.  Per her son, she last took her medications on Tuesday evening.      Meds: Eliquis 5 mg, p.o. twice daily Donezepil 10 mg, nightly Ferrous sulfate 325 mg, every other day Lasix 40 mg p.o. daily Calcium carbonate-Vitamin D, daily Xyzal 2.5 mg nightly Imodium daily as needed Memantine 5 mg p.o. daily Metoprolol tartrate 50 mg twice daily p.o. Mirtazapine 7.5 mg nightly Nitroglycerin 1.4 mg as needed Omeprazole 20 mg p.o. daily Klor-Con 20 mEq p.o. daily Pravastatin 40 mg p.o. daily Prednisone 7.5 mg p.o. daily Sodium chloride nasal spray as needed Trazodone 50 mg nightly Advil 200 mg as needed Polyethylene glycol propyl glycol drops, daily   Allergies: Pollen extract Metformin (diarrhea that was felt to be 2/2 metformin) Lisinopril (cough)  Family History: The patient's  family history includes Diabetes in her daughter; Heart disease in her brother, brother, mother, and sister; Lupus in her daughter.  Social History: Patient worked in [employer] for about 4 1/2 years. She is religious and talked about how her father is a Environmental education officer.  Review of Systems: A complete ROS was negative except as per HPI.   Physical Exam: Blood pressure (!) 143/83, pulse 60, temperature 99.8 F (37.7 C), temperature source Rectal, resp. rate 20, height '5\' 11"'$  (1.803 m), weight 45.4 kg, SpO2 99 %. Vitals and nursing note reviewed. General: Thin, elderly female in no acute distress HENT: Mucous membranes are dry Eyes: Teary-eyed, normal conjunctivae, PERRL Cardiovascular: Tachycardic. RRR. No m/rg Pulmonary: CTAB. Normal breath sounds  Abdominal: Soft, nontender, normal bowel sounds Musculoskeletal: Left wrist: swelling and mild tenderness to palpation   BLE: Left air cast intact. No pitting edema Skin: Warm and dry Neurological: Alert, oriented to name and birthday.  Str: RUE 5/5. LUE: unable to assess in the setting of wrist injury. BLE: 5/5 Psychiatric: Normal mood and affect  EKG: personally reviewed my interpretation is atrial fibrillation rate controlled  CXR: personally reviewed my interpretation is no acute disease and cardiomegaly  Assessment & Plan by Problem: Principal Problem:   Frequent falls  Stephanie Horne is a 22 y.o. COVID + female with a PMHx of iron deficiency anemia, AIHA in 2008 on prednisone, alzheimer's dementia, HTN, diastolic heart failure, lupus, T2DM who presents here today after sustaining frequent falls.   Frequent falls COVID-positive Malnutrition Patient presents after sustaining numerous falls over the past several weeks.  History obtained mostly from son who states that she has been falling more frequently, is having progressive weakness and decreased oral intake.  Reports at baseline she is able to get up out of bed on her own and ambulate with the assistance of a walker.  States for the past week she has been unable to mobilize on her own.  States that she has been eating less however he thinks that this may be due to to her trying to prevent nocturia.  Patient only complains of left ankle and wrist pain.  She has a cough and some congestion.  No dysuria, chest pain, shortness of breath or other signs or symptoms of infection.  Incidentally found to be COVID-positive, saturating well on room air.  Discussed that this may be contributing to patient's presentation to some degree.  Discussed with son that we would recommend increasing her prednisone to 20 mg for 5 days.  Patient's son would like to defer at this time and just resume her home medications.  Patient is hemodynamically stable labs are unremarkable.  Patient had a CT head and cervical spine which were unremarkable.  She also had pelvic, left lower  extremity and forearm imaging which is negative for any acute abnormality.  Left lower extremity radiograph did show diffuse swelling around the ankle, likely consistent with a sprained ankle.  Per history provided by the son it sounds like her falls are mechanical in nature.  She has not endorsed presyncopal symptoms.  Mentation is at baseline.  Given patient's acute decline over the past week I do believe her COVID infection is playing a role.  Will monitor overnight, repeat blood work and have PT/OT as well as nutrition evaluate the patient.  She may benefit from short-term SNF placement to increase her strength and gait training. -PT/OT evaluation -Consult to nutrition -Continue to monitor COVID symptoms -Encourage p.o. intake -IV fluids LR 50 cc continuous for 10 hours -F/u UA  Dark stools Patient's son reports history of dark stools.  For PT done in  the ED was negative.  Patient has never had a colonoscopy however I would not recommend elective colonoscopy given patient's current condition in a nonemergent setting without evidence of GI bleed. -Trend CBC  Atrial fibrillation Rate controlled. Resume home Eliquis and metoprolol. -Continue cardiac monitoring  Autoimmune hemolytic anemia Diagnosed in 2008 currently on prednisone 7.5 mg daily.  Discussed increasing prednisone dose in the setting of COVID and mild symptoms however patient's son would like to continue home dose. -Continue prednisone 7.5 mg daily  Alzheimer's dementia Continue home medications memantine and donepezil  Hypertension Diastolic heart failure Last echocardiogram 2017 showed normal EF and no regional wall abnormalities.  Continue home medications of Lasix and metoprolol  Lupus Patient was diagnosed in 2007, stable.  Type 2 diabetes Hemoglobin A1c 6.4-year ago.  Currently not on any diabetes medications. -CBG monitoring -SSI  Iron deficiency anemia CBC stable.  Continue ferrous sulfate 325 mg  daily  Diet: Carb modified/heart healthy VTE prophylaxis: Eliquis Full code  Dispo: Admit patient to Observation with expected length of stay less than 2 midnights.  SignedMike Craze, DO 04/28/2021, 7:06 PM  Pager: [phone] After 5pm on weekdays and 1pm on weekends: On Call pager: [phone]

## 2021-04-28 NOTE — Telephone Encounter (Signed)
Telehealth appt cancel. Dr Collene Gobble stated pt is at the ED.

## 2021-04-28 NOTE — Telephone Encounter (Signed)
  Chronic Care Management   Note  04/28/2021 Name: Stephanie Horne MRN: [id number] DOB: 04-06-1941  Received call back from patient's son, Stephanie Horne who informed this RNCM his Mom was at [facility] for [facility].  He also noted they just found out his Mom was approved for Medicaid so the family is going start process for PCS when she is out of the hospital.  Johnney Killian, RN, BSN, CCM Care Management Coordinator [facility] Internal Medicine Phone: [phone] / Fax: [phone]

## 2021-04-28 NOTE — Chronic Care Management (AMB) (Signed)
  Care Management   Social Work Visit Note  04/28/2021 Name: Stephanie Horne MRN: [id number] DOB: [birth year]  SW reviewed chart with Dr. Collene Gobble regarding scheduled telehealth appointment. Telehealth appointment was canceled due to patient being in the ED.     [name], [place]  Social Worker [facility] Care Management  [phone]

## 2021-04-28 NOTE — Telephone Encounter (Signed)
  Chronic Care Management   Note  04/28/2021 Name: Stephanie Horne MRN: [id number] DOB: 11/09/1940   Successful call placed to patient's son, Elenore Rota at [phone], at the request of the triage nurse. Elenore Rota noted that his mom has had numerous falls over the past week or so.  She had a big fall and injured her hip (unclear Rt vs Lt) and her wrist.  Patient is unable to bear weight and cannot ambulate.  Patient has also been eating very little as she is concerned about having to go to the bathroom, per Elenore Rota.  This RNCM spoke with patirnwt , who is very [place] and tried to explain she needed xrays.  She just repeated over and over that she can take care of herself and would not interact. [place] Prom the best thing he can do is to call EMS to safely bring her for evaluation/X-rays as she may have a fractured hip.  Elenore Rota is going to call EMS but he is concerned about patient refusing to go to hospital.  Patient has a telephone visit with MD this afternoon and Elenore Rota will call and cancel if they are able to get patient to hospital for evaluation.  Provided Elenore Rota with my direct phone number if I can be of any assistance.  Johnney Killian, RN, BSN, CCM Care Management Coordinator [facility] Internal Medicine Phone: [phone] / Fax: [phone]

## 2021-04-28 NOTE — Telephone Encounter (Signed)
Call from pt's daughter who stated EMS is currently at the home and wanted to know what meds pt is allergic to - informed, according to her chart, lisinopril and metformin.

## 2021-04-28 NOTE — Telephone Encounter (Signed)
Call from pt's son Elenore Rota who's very concern about his mother. He lives in [place] but currently here to check on hs mother. Stated pt has fallen 4 - 5 times in the last 3 -4 days. Stated she has fallen and hurt her hip; she's unable to stand but he does not think anything is broken. She cannot walk; everytime she stands, she falls. Pt refuses to go to the hospital. I suggested calling EMS to check pt - he stated he might end up doing this. I asked about her case manager; he stated her name is Neoma Laming but her mother always sugar coat things. Informed I will ask Neoma Laming to give him a call. Also I schedule a telehealth appt today @ 1515 PM w/ Dr Charleen Kirks so he can discuss what's going on, pt's safety and his concerns - he's agreeable. Son's telephone # [phone].

## 2021-04-28 NOTE — ED Triage Notes (Signed)
Pt bib [facility] from home with family complaining of pt falling multiple times this morning. Also complains of increased weakness and decreased appetite over the past few days. No LOC or obvious head trauma. Pt is on Eliquis. Pt son is worried about L hip with pt leaning to Right side.  EMS vitals: 98.8 T, 130/80 BP,76 HR, 132 CBG

## 2021-04-28 NOTE — Hospital Course (Addendum)
Frequent falls, likely mechanical in nature COVID-positive Severe malnutrition Patient presents after sustaining numerous falls over the past several weeks. History obtained mostly from son who states that she has been falling more frequently, is having progressive weakness and decreased oral intake. Reports at baseline she is able to get up out of bed on her own and ambulate with the assistance of a walker. States for the past week she has been unable to mobilize on her own. States that she has been eating less however he thinks that this may be due to to her trying to prevent nocturia. Patient only complains of left ankle and wrist pain, trauma imaging was unremarkable. Incidentally found to be COVID-positive, saturating well on room air. Initial recommendation to increase her prednisone to 20 mg for 5 days for her COVID infection.  However son deferred and wanted to continue her home regimen. Given the mild nature of her infection, we continued prednisone 7.5 mg daily, and the patient was hemodynamically stable throughout her hospital stay. The patient was admitted for monitoring, and PT, OT, and nutrition were consulted.  PT recommended short-term SNF placement. Patient continued to agree to short-term SNF placement after multiple discussions with her and her son.***   Dark stools The patient's son states that his main concern about his mother's are persistent diarrhea. Per the son this has been going on for at least a year, however review of records shows that original work-up was done in 2012 work-up at that time included stool ova and cyst, and this was unremarkable. At that time, also considered checking stool that if her diarrhea did not improve, however diarrhea improved and further work-up was not pursued. Plan was to continue Imodium regimen, however son states that this no longer alleviates her diarrhea. She continues to be afebrile, WBC counts have been stable. Per RN team, patient did not have  any diarrhea while in the hospital. I discussed at length with the son our recommendation to hold off on elective colonoscopy given patient's current condition in a nonemergent setting without evidence of GI bleed. In the outpatient setting ([facility]), could consider further work-up including fecal elastase, stool culture, etc.  Atrial fibrillation Rate controlled. Patient is on home Eliquis and metoprolol. Given her age, we decreased her Eliquis regimen from 5 mg twice daily to 2.5 mg twice daily.   Autoimmune hemolytic anemia Diagnosed in 2008 currently on prednisone 7.5 mg daily. Continued home prednisone regimen (see above).    Alzheimer's dementia Continue home medications memantine and donepezil   Hypertension Diastolic heart failure Last echocardiogram 2017 showed normal EF and no regional wall abnormalities. We continued her home Lasix and metoprolol throughout her hospital stay.    Type 2 diabetes Hemoglobin A1c 6.4-year ago.  Patient does not have a home diabetic regimen.  Initially started on sliding scale insulin with CBG monitoring.  However, patient had some recent episodes of hypoglycemia, and she has had poor intake while she has been here. therefore we discontinued her sliding scale insulin and continued CBG monitoring.  Iron deficiency anemia CBC stable. Continued ferrous sulfate 325 mg daily during her hospital stay.

## 2021-04-28 NOTE — Telephone Encounter (Signed)
  Chronic Care Management   Note  04/28/2021 Name: SABA SOBERON MRN: [id number] DOB: 06-29-1941  Received call from patients Son, Elenore Rota that his Mother was in the emergency room for evaluation and please cancel the telehealth visit.  Johnney Killian, RN, BSN, CCM Care Management Coordinator [facility] Internal Medicine Phone: [phone] / Fax: [phone]

## 2021-04-29 ENCOUNTER — Encounter (HOSPITAL_COMMUNITY): Payer: Self-pay | Admitting: Internal Medicine

## 2021-04-29 DIAGNOSIS — R531 Weakness: Principal | ICD-10-CM

## 2021-04-29 DIAGNOSIS — S9002XA Contusion of left ankle, initial encounter: Secondary | ICD-10-CM | POA: Diagnosis not present

## 2021-04-29 DIAGNOSIS — R296 Repeated falls: Secondary | ICD-10-CM | POA: Diagnosis not present

## 2021-04-29 LAB — CBC
HCT: 30.4 % — ABNORMAL LOW (ref 36.0–46.0)
Hemoglobin: 9.4 g/dL — ABNORMAL LOW (ref 12.0–15.0)
MCH: 31.4 pg (ref 26.0–34.0)
MCHC: 30.9 g/dL (ref 30.0–36.0)
MCV: 101.7 fL — ABNORMAL HIGH (ref 80.0–100.0)
Platelets: 231 10*3/uL (ref 150–400)
RBC: 2.99 MIL/uL — ABNORMAL LOW (ref 3.87–5.11)
RDW: 14.6 % (ref 11.5–15.5)
WBC: 7.1 10*3/uL (ref 4.0–10.5)
nRBC: 0 % (ref 0.0–0.2)

## 2021-04-29 LAB — BASIC METABOLIC PANEL
Anion gap: 8 (ref 5–15)
BUN: 6 mg/dL — ABNORMAL LOW (ref 8–23)
CO2: 23 mmol/L (ref 22–32)
Calcium: 8.6 mg/dL — ABNORMAL LOW (ref 8.9–10.3)
Chloride: 103 mmol/L (ref 98–111)
Creatinine, Ser: 0.75 mg/dL (ref 0.44–1.00)
GFR, Estimated: >60 mL/min (ref >60)
Glucose, Bld: 135 mg/dL — ABNORMAL HIGH (ref 70–99)
Potassium: 4.4 mmol/L (ref 3.5–5.1)
Sodium: 134 mmol/L — ABNORMAL LOW (ref 135–145)

## 2021-04-29 LAB — URINALYSIS, ROUTINE W REFLEX MICROSCOPIC
Bilirubin Urine: NEGATIVE
Glucose, UA: NEGATIVE mg/dL
Hgb urine dipstick: NEGATIVE
Ketones, ur: 20 mg/dL — AB
Leukocytes,Ua: NEGATIVE
Nitrite: NEGATIVE
Protein, ur: NEGATIVE mg/dL
Specific Gravity, Urine: 1.011 (ref 1.005–1.030)
pH: 6 (ref 5.0–8.0)

## 2021-04-29 LAB — GLUCOSE, CAPILLARY
Glucose-Capillary: 117 mg/dL — ABNORMAL HIGH (ref 70–99)
Glucose-Capillary: 66 mg/dL — ABNORMAL LOW (ref 70–99)
Glucose-Capillary: 148 mg/dL — ABNORMAL HIGH (ref 70–99)
Glucose-Capillary: 145 mg/dL — ABNORMAL HIGH (ref 70–99)
Glucose-Capillary: 56 mg/dL — ABNORMAL LOW (ref 70–99)
Glucose-Capillary: 91 mg/dL (ref 70–99)

## 2021-04-29 MED ORDER — ONDANSETRON HCL 4 MG PO TABS: 4.0000 mg | ORAL_TABLET | Freq: Once | ORAL | Status: DC

## 2021-04-29 MED ORDER — ADULT MULTIVITAMIN W/MINERALS CH
1.0000 | ORAL_TABLET | Freq: Every day | ORAL | Status: DC
  Administered 2021-04-29 – 2021-05-03 (×5): 1 via ORAL
  Filled 2021-04-29 (×5): qty 1

## 2021-04-29 MED ORDER — APIXABAN 2.5 MG PO TABS
2.5000 mg | ORAL_TABLET | Freq: Two times a day (BID) | ORAL | Status: DC
  Administered 2021-04-29 – 2021-05-03 (×7): 2.5 mg via ORAL
  Filled 2021-04-29 (×8): qty 1

## 2021-04-29 MED ORDER — ENSURE ENLIVE PO LIQD
237.0000 mL | Freq: Three times a day (TID) | ORAL | Status: DC
  Administered 2021-04-30 – 2021-05-02 (×5): 237 mL via ORAL

## 2021-04-29 NOTE — Progress Notes (Signed)
Subjective: Overnight, there were no acute events. Patient was seen at bedside this morning.  The patient states that she did not sleep well overnight.  She does endorse some nausea today but no abdominal pain. Denies other pain. States that her son and sister are coming to visit later today.  Over the phone with son: Spoke with her about potential short-term SNF placement.  He is amenable to this but would like to discuss this with his mother and the team, because he states that she is "stubborn." His main concern for his mother, however, is her diarrhea that has been going on for the past year. Denies any fevers. He states that she takes Imodium frequently, however it does not alleviate her diarrhea.  Objective:  Vital signs in last 24 hours: Vitals:   04/29/21 0000 04/29/21 0128 04/29/21 0215 04/29/21 0345  BP: 123/70 101/78 113/72 125/90  Pulse: 77 89 90 88  Resp: (!) '22 17 17 16  '$ Temp:  98.8 F (37.1 C) 98.7 F (37.1 C) 98.9 F (37.2 C)  TempSrc:  Oral Oral Oral  SpO2: 100% 97%  100%  Weight:  52.8 kg    Height:        General: NAD, thin, elderly female lying comfortably in bed HE: Normocephalic, atraumatic , EOMI, Conjunctivae normal ENT: No congestion, no rhinorrhea, no exudate or erythema, dry mucous membranes Cardiovascular: Normal rate, regular rhythm.  No murmurs, rubs, or gallops Pulmonary : Effort normal, breath sounds normal. No wheezes, rales, or rhonchi Abdominal: soft, nontender,  bowel sounds present Musculoskeletal: Persistent left wrist swelling from recent injury without deformity.  Aircast intact over the left ankle.  No other injury, or tenderness in extremities. Skin: Warm, dry, no bruising, erythema, or rash Psychiatric/Behavioral:  normal mood, normal behavior    Assessment/Plan:  Stephanie Horne is a 80 y.o. COVID + female with a PMHx of iron deficiency anemia, AIHA in 2008 on prednisone, alzheimer's dementia, HTN, diastolic heart failure, lupus, T2DM  who presents here today after sustaining frequent falls.   Principal Problem:   Frequent falls   Frequent falls COVID-positive Malnutrition Patient presents after sustaining frequent falls in the past few weeks. Likely mechanical in nature, in the setting of malnutrition and recent COVID infection. We have consulted PT/OT as well as nutrition to evaluate the patient. She may benefit from short-term SNF placement to increase her strength and gait training, however we will discuss this further with the patient and her son this afternoon. -PT/OT evaluation -Consult to nutrition -Encourage p.o. intake -F/u UA   Dark stools Patient's son reports history of dark stools. FOBT done in the ED was negative. Hemoglobin has been stable. Patient has never had a colonoscopy however will hold off on elective colonoscopy given patient's current condition in a nonemergent setting without evidence of GI bleed. -Trend CBC   Atrial fibrillation Rate controlled. Continue home Eliquis and metoprolol. -Telemetry -Eliquis decreased to 2.5 megs twice daily due to her age -Continue home metoprolol   Autoimmune hemolytic anemia Diagnosed in 2008 currently on prednisone 7.5 mg daily. Recently discussed increasing prednisone dose in the setting of COVID and mild symptoms however patient's son would like to continue home dose. -Continue prednisone 7.5 mg daily   Alzheimer's dementia Continue home medications memantine and donepezil.   Hypertension Diastolic heart failure Last echocardiogram 2017 showed normal EF and no regional wall abnormalities. Continue home medications of Lasix and metoprolol  Lupus Patient was diagnosed in 2007, stable. Per review of  records, she has not been on any medications for multiple years.   Type 2 diabetes Hemoglobin A1c 6.4-year ago. Currently not on any diabetes medications. -CBG monitoring -SSI   Iron deficiency anemia CBC stable. Continue ferrous sulfate 325 mg  daily  Prior to Admission Living Arrangement: Home, with her son Anticipated Discharge Location: TBD Barriers to Discharge: Potential SNF placement Dispo: Anticipated discharge in approximately 1 day(s).   Stephanie Brill, MD 04/29/2021, 7:06 AM Pager: [phone]  After 5pm on weekdays and 1pm on weekends: On Call pager [phone]

## 2021-04-29 NOTE — Progress Notes (Signed)
  Date: 04/29/2021  Patient name: Stephanie Horne  Medical record number: [id number]  Date of birth: 1941-08-21   I have seen and evaluated Stephanie Horne and discussed their care with the Residency Team. Briefly, [name] is an 80 year old woman who presented with multiple falls at home.  She has hurt her wrist and ankle.  She has AD and was found to be COVID +.  She has a low BMI.  Notes that her PO intake has been decreasing.  She has no respiratory symptoms at this time and COVID appears to be incidental.   PMHx, Fam Hx, and/or Soc Hx : She has a FH of DM, Heart disease and lupus  Vitals:   04/29/21 0817 04/29/21 1100  BP: 120/75 117/72  Pulse: 89 81  Resp: 18 18  Temp: 98.3 F (36.8 C) 99.1 F (37.3 C)  SpO2: 97% 99%   Gen: Thin woman, lying in bed, hard of hearing Eyes: Anicteric sclerae Thin extremities, dry MM, decreased skin turgor Further exam declined by patient at this time.   Assessment and Plan: I have seen and evaluated the patient as outlined above. I agree with the formulated Assessment and Plan as detailed in the residents' note, with the following changes:   Frequent falls related to COVID 19 infection, decreased PO intake Mod-Sev malnutrition (low BMI, low muscle mass) Dehydration Dysequilibrium - PT/OT - Nutrition consult - Monitor for change in respiratory symptoms - Airborne and contact precautions - Continue to encourage PO intake, mIVF for insensible losses - Resident team discussed with family possibility of increasing steroid dose (on for hemolytic anemia) however, this will only be done if she develops worsening symptoms from COVID infection  Other issues per Dr. Evette Georges daily note.   Sid Falcon, MD 7/28/20223:39 PM

## 2021-04-29 NOTE — Progress Notes (Signed)
CBG rechecked. Now 148.

## 2021-04-29 NOTE — Evaluation (Signed)
Physical Therapy Evaluation Patient Details Name: Stephanie Horne MRN: [id number] DOB: December 19, 1940 Today's Date: 04/29/2021   History of Present Illness  80 y.o. female admitted on 04/28/21 for frequent falls for several days PTA injuring her L ankle and L wrist (no fx), air cast for L ankle.  Pt has also not been eating much PTA.  Pt found to be COVID positive.  Pt with significant PMH of A-fib, autoimmune hemolytic anemia, Alzheimer's dementia, HTN, diastolic heart failure, DM2, iron deficient anemia.  Clinical Impression  Pt self limiting due to fear of falling, but also very weak.  She refused to attempt to stand even when I offered to get a second person to help.  She did not know it was morning, so I turned on all of her lights to ensure it looked more like daytime.  She is perseverative and tangential, but this may be her baseline given h/o dementia.  I spoke with her daughter, [name], on the phone who reports her brother makes all the decisions re: her care (when I mentioned SNF for rehab).   PT to follow acutely for deficits listed below.       Follow Up Recommendations SNF    Equipment Recommendations  Hospital bed;Wheelchair (measurements PT);Wheelchair cushion (measurements PT);Other (comment) (hoyer lift)    Recommendations for Other Services       Precautions / Restrictions Precautions Precautions: Fall Required Braces or Orthoses: Other Brace Other Brace: air cast L ankle Restrictions Weight Bearing Restrictions: No      Mobility  Bed Mobility Overal bed mobility: Needs Assistance Bed Mobility: Supine to Sit;Sit to Supine     Supine to sit: Min assist;HOB elevated Sit to supine: Mod assist;HOB elevated   General bed mobility comments: Min assist to help progress legs to EOB, HOB maximally elevated, pt asking me to let her do it herself, very slow, very difficult.  She reports she does sleep in a bed at home (vs a recliner chair).    Transfers                  General transfer comment: Pt refusing to stand, reporting fear of falling, I asked if I got another person if she would stand and she kept refusing.  It was difficult to even get her to participate in EOB.  Ambulation/Gait                Stairs            Wheelchair Mobility    Modified Rankin (Stroke Patients Only)       Balance Overall balance assessment: Needs assistance Sitting-balance support: Feet supported;Bilateral upper extremity supported Sitting balance-Leahy Scale: Poor Sitting balance - Comments: Pt leaning back and on her R elbow, unable to sit fully upright. Postural control: Posterior lean;Right lateral lean     Standing balance comment: refused                             Pertinent Vitals/Pain Pain Assessment: Faces Faces Pain Scale: Hurts little more Pain Location: generalized to left side Pain Descriptors / Indicators: Guarding;Grimacing Pain Intervention(s): Limited activity within patient's tolerance;Monitored during session;Repositioned    Home Living Family/patient expects to be discharged to:: Private residence Living Arrangements: Children Available Help at Discharge: Other (Comment) (unsure, pt is a poor historian)           Home Equipment: Walker - 4 wheels      Prior  Function Level of Independence: Needs assistance   Gait / Transfers Assistance Needed: per pt she walked with her "roller" 4 wheeled RW           Hand Dominance        Extremity/Trunk Assessment   Upper Extremity Assessment Upper Extremity Assessment: Defer to OT evaluation (reports left flank and shoulder pain, didn't really mention the wrist)    Lower Extremity Assessment Lower Extremity Assessment: Generalized weakness    Cervical / Trunk Assessment Cervical / Trunk Assessment: Kyphotic (when sitting, ribcage resting on pelvis, poor core/trunk strength)  Communication   Communication: No difficulties  Cognition  Arousal/Alertness: Awake/alert Behavior During Therapy: Restless (perseverative, tangental) Overall Cognitive Status: History of cognitive impairments - at baseline                                 General Comments: Not sure if this is worse than baseline, but has h/o dementia      General Comments General comments (skin integrity, edema, etc.): I spoke to her daughter, [name] on the phone for a moment discussing d/c needs/plans and she stated that her brother would be making those decisions.  I updated her to her mom's current participation level and that we were unable to stand or get up into the chair.    Exercises     Assessment/Plan    PT Assessment Patient needs continued PT services  PT Problem List Decreased strength;Decreased activity tolerance;Decreased balance;Decreased mobility;Decreased cognition;Decreased knowledge of use of DME;Decreased safety awareness;Decreased knowledge of precautions;Cardiopulmonary status limiting activity;Pain       PT Treatment Interventions DME instruction;Gait training;Stair training;Functional mobility training;Therapeutic activities;Therapeutic exercise;Balance training;Cognitive remediation;Neuromuscular re-education;Patient/family education;Wheelchair mobility training    PT Goals (Current goals can be found in the Care Plan section)  Acute Rehab PT Goals PT Goal Formulation: Patient unable to participate in goal setting Time For Goal Achievement: 05/13/21 Potential to Achieve Goals: Good    Frequency Min 2X/week   Barriers to discharge        Co-evaluation               AM-PAC PT "6 Clicks" Mobility  Outcome Measure Help needed turning from your back to your side while in a flat bed without using bedrails?: A Little Help needed moving from lying on your back to sitting on the side of a flat bed without using bedrails?: A Little Help needed moving to and from a bed to a chair (including a wheelchair)?: Total Help  needed standing up from a chair using your arms (e.g., wheelchair or bedside chair)?: Total Help needed to walk in hospital room?: Total Help needed climbing 3-5 steps with a railing? : Total 6 Click Score: 10    End of Session   Activity Tolerance: Patient limited by fatigue;Other (comment) (self limiting.) Patient left: in bed;with call bell/phone within reach;with bed alarm set   PT Visit Diagnosis: Muscle weakness (generalized) (M62.81);Difficulty in walking, not elsewhere classified (R26.2);Repeated falls (R29.6)    Time: [id number] PT Time Calculation (min) (ACUTE ONLY): 61 min   Charges:   PT Evaluation $PT Eval Moderate Complexity: 1 Mod PT Treatments $Therapeutic Activity: 38-52 mins       Verdene Lennert, PT, DPT  Acute Rehabilitation Ortho Tech Supervisor [phone] pager [phone]) [phone] office

## 2021-04-29 NOTE — Progress Notes (Signed)
Initial Nutrition Assessment  DOCUMENTATION CODES:   Underweight, Severe malnutrition in context of chronic illness  INTERVENTION:   -Feeding assistance with meals -Ensure Enlive po TID, each supplement provides 350 kcal and 20 grams of protein  -MVI with minerals daily -Magic cup TID with meals, each supplement provides 290 kcal and 9 grams of protein  -Liberalize diet to regular  NUTRITION DIAGNOSIS:   Severe Malnutrition related to chronic illness (dementia) as evidenced by severe fat depletion, severe muscle depletion.  GOAL:   Patient will meet greater than or equal to 90% of their needs  MONITOR:   PO intake, Supplement acceptance, Labs, Weight trends, Skin, I & O's  REASON FOR ASSESSMENT:   Malnutrition Screening Tool    ASSESSMENT:   Stephanie Horne is a 80 y.o. COVID + female with a PMHx of iron deficiency anemia, AIHA in 2008 on prednisone, alzheimer's dementia, HTN, diastolic heart failure, lupus, T2DM who presents here today after sustaining frequent falls.  Pt admitted with frequent falls and COVID-19.   Reviewed I/O's: +545 ml x 24 hours  Spoke with pt at bedside, who complains about wanting to go home so she can stay in her "own room". She tells RD stories about how she used to work at a hospital, graduated college, worked as Engineer, structural, and cared for her husband prior to his passing. She was unable to tell this RD when her husband passed away.   Pt was very difficult to redirect and unable to provide history. She was able to tell this RD that she is "in the hospital because I started falling a lot". Per H&P, pt son reports poor oral intake for about 1 week PTA. No meal completion data available to assess at this time.   No wt history available to assess weight changes at this time.  Medications reviewed and include remeron and prednisone.   Labs reviewed: Na: 134, CBGS: 102-117 (inpatient orders for glycemic control are 0-9 units insulin aspart TiID  with meals).    NUTRITION - FOCUSED PHYSICAL EXAM:  Flowsheet Row Most Recent Value  Orbital Region Severe depletion  Upper Arm Region Severe depletion  Thoracic and Lumbar Region Severe depletion  Buccal Region Severe depletion  Temple Region Severe depletion  Clavicle Bone Region Severe depletion  Clavicle and Acromion Bone Region Severe depletion  Scapular Bone Region Severe depletion  Dorsal Hand Severe depletion  Patellar Region Severe depletion  Anterior Thigh Region Severe depletion  Posterior Calf Region Severe depletion  Edema (RD Assessment) None  Hair Reviewed  Eyes Reviewed  Mouth Reviewed  Skin Reviewed  Nails Reviewed       Diet Order:   Diet Order             Diet heart healthy/carb modified Room service appropriate? Yes; Fluid consistency: Thin  Diet effective now                   EDUCATION NEEDS:   Not appropriate for education at this time  Skin:  Skin Assessment: Reviewed RN Assessment  Last BM:  Unknown  Height:   Ht Readings from Last 1 Encounters:  04/28/21 '5\' 11"'$  (1.803 m)    Weight:   Wt Readings from Last 1 Encounters:  04/29/21 52.8 kg    Ideal Body Weight:  78.2 kg  BMI:  Body mass index is 16.23 kg/m.  Estimated Nutritional Needs:   Kcal:  1650-1850  Protein:  80-95 grams  Fluid:  > 1.6 L  Loistine Chance, RD, LDN, [place] Registered Dietitian II Certified Diabetes Care and Education Specialist Please refer to [facility] for RD and/or RD on-call/weekend/after hours pager

## 2021-04-29 NOTE — TOC Initial Note (Signed)
Transition of Care [facility]) - Initial/Assessment Note    Patient Details  Name: Stephanie Horne MRN: [id number] Date of Birth: October 09, 1940  Transition of Care [facility]) CM/SW Contact:    Geralynn Ochs, LCSW Phone Number: 04/29/2021, 1:35 PM  Clinical Narrative:      CSW spoke with patient's son, Elenore Rota, over the phone about SNF recommendation. Donald in agreement, says that patient is usually able to ambulate mostly independently until recent and she has been falling. CSW explained current barriers to SNF placement, including COVID+ status and requiring isolation. CSW discussed limited SNF options and that it may take some time if there are no beds available, it just depends on what is available presently. Elenore Rota indicated understanding. CSW has faxed out referral, will follow up with COVID SNFs on bed availability.              Expected Discharge Plan: Skilled Nursing Facility Barriers to Discharge: Continued Medical Work up, Ship broker, SNF Covid   Patient Goals and CMS Choice Patient states their goals for this hospitalization and ongoing recovery are:: patient unable to participate in goal setting, only oriented to self CMS Medicare.gov Compare Post Acute Care list provided to:: Patient Represenative (must comment) Choice offered to / list presented to : Adult Children  Expected Discharge Plan and Services Expected Discharge Plan: [place] Acute Care Choice: [place] arrangements for the past 2 months: Single Family Home                                      Prior Living Arrangements/Services Living arrangements for the past 2 months: Single Family Home Lives with:: Adult Children Patient language and need for interpreter reviewed:: No Do you feel safe going back to the place where you live?: Yes      Need for Family Participation in Patient Care: Yes (Comment) Care giver support system in place?: No  (comment)   Criminal Activity/Legal Involvement Pertinent to Current Situation/Hospitalization: No - Comment as needed  Activities of Daily Living Home Assistive Devices/Equipment: Gilford Rile (specify type) ADL Screening (condition at time of admission) Patient's cognitive ability adequate to safely complete daily activities?: No Is the patient deaf or have difficulty hearing?: No Does the patient have difficulty seeing, even when wearing glasses/contacts?: No Does the patient have difficulty concentrating, remembering, or making decisions?: Yes Patient able to express need for assistance with ADLs?: No Does the patient have difficulty dressing or bathing?: Yes Independently performs ADLs?: No Does the patient have difficulty walking or climbing stairs?: Yes Weakness of Legs: Both Weakness of Arms/Hands: Both  Permission Sought/Granted Permission sought to share information with : Facility Sport and exercise psychologist, Family Supports Permission granted to share information with : Yes, Verbal Permission Granted  Share Information with NAME: Elenore Rota  Permission granted to share info w AGENCY: SNF  Permission granted to share info w Relationship: Son     Emotional Assessment   Attitude/Demeanor/Rapport: Unable to Assess Affect (typically observed): Unable to Assess Orientation: : Oriented to Self Alcohol / Substance Use: Not Applicable Psych Involvement: No (comment)  Admission diagnosis:  Melena [K92.1] General weakness [R53.1] Acute encephalopathy [G93.40] Frequent falls [R29.6] Contusion of left ankle, initial encounter [S90.02XA] Moderate left ankle sprain, initial encounter [S93.402A] Pneumonia due to COVID-19 virus [U07.1, J12.82] Patient Active Problem List   Diagnosis Date Noted   Frequent falls 04/28/2021  PCP:  Lucious Groves, DO Pharmacy:   [facility], [place] [address] Phone: [phone] Fax:  [phone]     Social Determinants of Health (SDOH) Interventions    Readmission Risk Interventions No flowsheet data found.

## 2021-04-29 NOTE — TOC CAGE-AID Note (Signed)
Transition of Care [facility]) - CAGE-AID Screening   Patient Details  Name: Stephanie Horne MRN: [id number] Date of Birth: 04-19-1941  Transition of Care [facility]) CM/SW Contact:    Louetta Hollingshead C Tarpley-Carter, Leon Phone Number: 04/29/2021, 11:57 AM   Clinical Narrative: Pt is unable to participate in Cage Aid. Pt is experiencing dementia, pt is not appropriate for assessment.  Marl Seago Tarpley-Carter, MSW, LCSW-A Pronouns:  She/Her/Hers [facility]itions of Care Clinical Social Worker Direct Number:  [phone] [email or web address]   CAGE-AID Screening: Substance Abuse Screening unable to be completed due to: : Patient unable to participate (Pt is experiencing dementia, pt is not appropriate for assessment.)

## 2021-04-29 NOTE — NC FL2 (Signed)
[place] LEVEL OF CARE SCREENING TOOL     IDENTIFICATION  Patient Name: Stephanie Horne Birthdate: [birth year] Sex: female Admission Date (Current Location): 04/28/2021  [facility] and [place] Number:  Herbalist and Address:  [facility], [place] [address], [place], [place] [address]      Provider Number: [id number]  Attending Physician Name and Address:  Sid Falcon, MD  Relative Name and Phone Number:  Elenore Rota, son, [phone]    Current Level of Care: Hospital Recommended Level of Care: [place] Prior Approval Number:    Date Approved/Denied:   PASRR Number: [id number]  Discharge Plan: SNF    Current Diagnoses: Patient Active Problem List   Diagnosis Date Noted   Frequent falls 04/28/2021    Orientation RESPIRATION BLADDER Height & Weight     Self  Normal (Simultaneous filing. User may not have seen previous data.) Incontinent Weight: 116 lb 6.5 oz (52.8 kg) Height:  '5\' 11"'$  (180.3 cm)  BEHAVIORAL SYMPTOMS/MOOD NEUROLOGICAL BOWEL NUTRITION STATUS      Continent Diet (Simultaneous filing. User may not have seen previous data.)  AMBULATORY STATUS COMMUNICATION OF NEEDS Skin   Extensive Assist Verbally Normal                       Personal Care Assistance Level of Assistance  Bathing, Feeding, Dressing Bathing Assistance: Maximum assistance Feeding assistance: Limited assistance Dressing Assistance: Maximum assistance     Functional Limitations Info  Hearing, Sight Sight Info: Impaired Hearing Info: Impaired      SPECIAL CARE FACTORS FREQUENCY  PT (By licensed PT), OT (By licensed OT) (Simultaneous filing. User may not have seen previous data.)     PT Frequency: 5x/wk (Simultaneous filing. User may not have seen previous data.) OT Frequency: 5x/wk (Simultaneous filing. User may not have seen previous data.)            Contractures Contractures Info: Not present    Additional  Factors Info  Code Status, Allergies, Psychotropic, Insulin Sliding Scale, Isolation Precautions Code Status Info: Full Allergies Info: NKA Psychotropic Info: Remeron '15mg'$  daily at bed, Aricept '10mg'$  daily at bed Insulin Sliding Scale Info: see DC summary Isolation Precautions Info: COVID + on 04/28/21     Current Medications (04/29/2021):  This is the current hospital active medication list Current Facility-Administered Medications  Medication Dose Route Frequency Provider Last Rate Last Admin   acetaminophen (TYLENOL) tablet 650 mg  650 mg Oral Q6H PRN Rehman, Areeg N, DO       Or   acetaminophen (TYLENOL) suppository 650 mg  650 mg Rectal Q6H PRN Rehman, Areeg N, DO       apixaban (ELIQUIS) tablet 2.5 mg  2.5 mg Oral BID Orvis Brill, MD       donepezil (ARICEPT) tablet 10 mg  10 mg Oral QHS Rehman, Areeg N, DO   10 mg at 04/28/21 2224   furosemide (LASIX) tablet 40 mg  40 mg Oral Daily Rehman, Areeg N, DO   40 mg at 04/29/21 0849   insulin aspart (novoLOG) injection 0-9 Units  0-9 Units Subcutaneous TID WC Rehman, Areeg N, DO       loperamide (IMODIUM) capsule 2 mg  2 mg Oral PRN Rehman, Areeg N, DO       memantine (NAMENDA) tablet 5 mg  5 mg Oral Daily Rehman, Areeg N, DO   5 mg at 04/29/21 0848   metoprolol tartrate (  LOPRESSOR) tablet 50 mg  50 mg Oral BID Rehman, Areeg N, DO   50 mg at 04/29/21 0848   mirtazapine (REMERON) tablet 15 mg  15 mg Oral QHS Rehman, Areeg N, DO   15 mg at 04/28/21 2224   pantoprazole (PROTONIX) EC tablet 40 mg  40 mg Oral Daily Rehman, Areeg N, DO   40 mg at 04/29/21 0848   pravastatin (PRAVACHOL) tablet 40 mg  40 mg Oral Daily Rehman, Areeg N, DO   40 mg at 04/29/21 0849   predniSONE (DELTASONE) tablet 7.5 mg  7.5 mg Oral Q breakfast Rehman, Areeg N, DO   7.5 mg at 04/28/21 2232   traZODone (DESYREL) tablet 50 mg  50 mg Oral QHS PRN Rehman, Areeg N, DO         Discharge Medications: Please see discharge summary for a list of discharge  medications.  Relevant Imaging Results:  Relevant Lab Results:   Additional Information SS#: [id number] (Simultaneous filing. User may not have seen previous data.)  Geralynn Ochs, LCSW

## 2021-04-29 NOTE — Evaluation (Signed)
Occupational Therapy Evaluation Patient Details Name: Stephanie Horne MRN: [id number] DOB: [birth year] Today's Date: 04/29/2021    History of Present Illness 80 y.o. female admitted on 04/28/21 for frequent falls for several days PTA injuring her L ankle and L wrist (no fx), air cast for L ankle.  Pt has also not been eating much PTA.  Pt found to be COVID positive.  Pt with significant PMH of A-fib, autoimmune hemolytic anemia, Alzheimer's dementia, HTN, diastolic heart failure, DM2, iron deficient anemia.   Clinical Impression   Pt admitted with the above diagnoses and presents with below problem list. Pt will benefit from continued acute OT to address the below listed deficits and maximize independence with basic ADLs prior to d/c to venue below. Per chart review and pt's report, it seems that pt has some assistance from family at baseline. Unclear PLOF and home setup as pt is unable to provide and no family present during OT eval. Pt limited by significant fear of falling. Despite encouragement and extra time pt ultimately only agreeable to coming into long sitting position and walking feet towards EOB this session. Pt appears to currently need max-total A with most ADLs, at bed level. She is able to feed herself with setup though she does so from a HOB reclined to about 35* position d/t fear of falling.      Follow Up Recommendations  Supervision/Assistance - 24 hour;SNF;Home health OT (will need 24/7 supervision and physical assist at d/c)    Equipment Recommendations  Other (comment) (TBD)    Recommendations for Other Services       Precautions / Restrictions Precautions Precautions: Fall Required Braces or Orthoses: Other Brace Other Brace: air cast L ankle Restrictions Weight Bearing Restrictions: No      Mobility Bed Mobility Overal bed mobility: Needs Assistance Bed Mobility: Supine to Sit;Sit to Supine     Supine to sit: Min assist;HOB elevated Sit to supine: Mod  assist;HOB elevated   General bed mobility comments: Able to "walk" each leg towards EOB with extra time/effort but no physical assist to complete that portion. Max A powerup trunk into longsitting position. Pt then became anxious and returned to supine.    Transfers                 General transfer comment: Pt declined even coming to EOB despite encouragement. Became anxious with any change to HOB angle. Pt very fearful of falling. May do better with a familiar face present ie family/friend.    Balance Overall balance assessment: Needs assistance Sitting-balance support: Feet supported;Bilateral upper extremity supported Sitting balance-Leahy Scale: Poor Sitting balance - Comments: unable to assess this session. Pt fearful of falling and reports "I already did that earlier." Postural control: Posterior lean;Right lateral lean     Standing balance comment: refused                           ADL either performed or assessed with clinical judgement   ADL Overall ADL's : Needs assistance/impaired Eating/Feeding: Set up;Bed level Eating/Feeding Details (indicate cue type and reason): able to feed self with setup. Pt does not want HOB raised more than ~35* making feeding a bit awkward but doable for pt. OT tried to provide environmental setup to accomodate this (positioning of table and food items). Grooming: Maximal assistance   Upper Body Bathing: Maximal assistance;Total assistance   Lower Body Bathing: Maximal assistance;Total assistance;Bed level   Upper Body Dressing :  Maximal assistance;Total assistance;Bed level   Lower Body Dressing: Maximal assistance;Total assistance;Bed level                 General ADL Comments: Pt ultimately only agreeable to coming into long sitting position (up to max A to steady trunk in this position). Pt able to raise each legs with no physical assist. Assist d/t cognition, generalized weakness and decreased activity tolerance      Vision         Perception     Praxis      Pertinent Vitals/Pain Pain Assessment: Faces Faces Pain Scale: Hurts little more Pain Location: generalized to left side. " I fell on my left side." Pain Descriptors / Indicators: Guarding;Grimacing Pain Intervention(s): Limited activity within patient's tolerance;Monitored during session;Repositioned     Hand Dominance     Extremity/Trunk Assessment Upper Extremity Assessment Upper Extremity Assessment: Generalized weakness (fell on left side per pt. residual soreness on that side)   Lower Extremity Assessment Lower Extremity Assessment: Defer to PT evaluation   Cervical / Trunk Assessment Cervical / Trunk Assessment: Kyphotic   Communication Communication Communication: No difficulties   Cognition Arousal/Alertness: Awake/alert Behavior During Therapy: Flat affect;Anxious Overall Cognitive Status: History of cognitive impairments - at baseline                                 General Comments: h/o dementia. no family present to compare to baseline. Most of the time pleasantly confused but quick to become anxious with positional changes such as raising/lowering HOB. Appears very anxious about falling.   General Comments  I spoke to her daughter, Stephanie Horne on the phone for a moment discussing d/c needs/plans and she stated that her brother would be making those decisions.  I updated her to her mom's current participation level and that we were unable to stand or get up into the chair.    Exercises     Shoulder Instructions      Home Living Family/patient expects to be discharged to:: Private residence Living Arrangements: Children Available Help at Discharge: Other (Comment) (unsure, pt is a poor historian)                         Home Equipment: Walker - 4 wheels   Additional Comments: unable to obtain home setup information. Pt is poor historian and no family present. She states her son helps her  at home, this does seem to match information in her chart.      Prior Functioning/Environment Level of Independence: Needs assistance  Gait / Transfers Assistance Needed: per pt she walked with her "roller" 4 wheeled RW ADL's / Homemaking Assistance Needed: unclear what assistance she needed at baseline            OT Problem List: Decreased strength;Decreased activity tolerance;Impaired balance (sitting and/or standing);Decreased cognition;Decreased knowledge of use of DME or AE;Decreased knowledge of precautions;Pain      OT Treatment/Interventions: Self-care/ADL training;DME and/or AE instruction;Therapeutic activities;Patient/family education;Balance training;Energy conservation    OT Goals(Current goals can be found in the care plan section) Acute Rehab OT Goals Patient Stated Goal: not fall OT Goal Formulation: With patient Time For Goal Achievement: 05/13/21 Potential to Achieve Goals: Fair ADL Goals Pt Will Perform Grooming: with max assist;sitting Pt Will Perform Upper Body Dressing: with max assist;sitting Pt Will Perform Lower Body Dressing: with max assist;sitting/lateral leans;sit to/from stand Pt Will  Transfer to Toilet: with max assist;stand pivot transfer;ambulating;bedside commode Pt Will Perform Toileting - Clothing Manipulation and hygiene: with max assist;sitting/lateral leans;sit to/from stand Additional ADL Goal #1: Pt will complete bed mobility at mod A level to prepare for EOB/OOB ADLs.  OT Frequency: Min 2X/week   Barriers to D/C:    unclear d/c plan and family's thoughts on goals of care       Co-evaluation              AM-PAC OT "6 Clicks" Daily Activity     Outcome Measure Help from another person eating meals?: A Little Help from another person taking care of personal grooming?: A Lot Help from another person toileting, which includes using toliet, bedpan, or urinal?: Total Help from another person bathing (including washing, rinsing,  drying)?: A Lot Help from another person to put on and taking off regular upper body clothing?: A Lot Help from another person to put on and taking off regular lower body clothing?: Total 6 Click Score: 11   End of Session    Activity Tolerance: Other (comment) (limited by fear of falling) Patient left: in bed;with call bell/phone within reach;with bed alarm set  OT Visit Diagnosis: Unsteadiness on feet (R26.81);Repeated falls (R29.6);Muscle weakness (generalized) (M62.81);History of falling (Z91.81);Other symptoms and signs involving cognitive function;Pain                Time: 1140-1200 OT Time Calculation (min): 20 min Charges:  OT General Charges $OT Visit: 1 Visit OT Evaluation $OT Eval Moderate Complexity: [place], OT Acute Rehabilitation Services Pager: [phone] Office: [phone]   Hortencia Pilar 04/29/2021, 12:36 PM

## 2021-04-30 DIAGNOSIS — G9340 Encephalopathy, unspecified: Secondary | ICD-10-CM

## 2021-04-30 DIAGNOSIS — E43 Unspecified severe protein-calorie malnutrition: Secondary | ICD-10-CM | POA: Diagnosis not present

## 2021-04-30 DIAGNOSIS — R296 Repeated falls: Secondary | ICD-10-CM | POA: Diagnosis not present

## 2021-04-30 LAB — BASIC METABOLIC PANEL
Anion gap: 10 (ref 5–15)
BUN: 14 mg/dL (ref 8–23)
CO2: 25 mmol/L (ref 22–32)
Calcium: 8.5 mg/dL — ABNORMAL LOW (ref 8.9–10.3)
Chloride: 97 mmol/L — ABNORMAL LOW (ref 98–111)
Creatinine, Ser: 0.94 mg/dL (ref 0.44–1.00)
GFR, Estimated: >60 mL/min (ref >60)
Glucose, Bld: 241 mg/dL — ABNORMAL HIGH (ref 70–99)
Potassium: 4.3 mmol/L (ref 3.5–5.1)
Sodium: 132 mmol/L — ABNORMAL LOW (ref 135–145)

## 2021-04-30 LAB — CBC
HCT: 28 % — ABNORMAL LOW (ref 36.0–46.0)
Hemoglobin: 9.1 g/dL — ABNORMAL LOW (ref 12.0–15.0)
MCH: 32.5 pg (ref 26.0–34.0)
MCHC: 32.5 g/dL (ref 30.0–36.0)
MCV: 100 fL (ref 80.0–100.0)
Platelets: 242 10*3/uL (ref 150–400)
RBC: 2.8 MIL/uL — ABNORMAL LOW (ref 3.87–5.11)
RDW: 14.4 % (ref 11.5–15.5)
WBC: 8 10*3/uL (ref 4.0–10.5)
nRBC: 0 % (ref 0.0–0.2)

## 2021-04-30 LAB — GLUCOSE, CAPILLARY
Glucose-Capillary: 125 mg/dL — ABNORMAL HIGH (ref 70–99)
Glucose-Capillary: 153 mg/dL — ABNORMAL HIGH (ref 70–99)
Glucose-Capillary: 143 mg/dL — ABNORMAL HIGH (ref 70–99)
Glucose-Capillary: 121 mg/dL — ABNORMAL HIGH (ref 70–99)

## 2021-04-30 MED ORDER — LACTATED RINGERS IV BOLUS
500.0000 mL | Freq: Once | INTRAVENOUS | Status: AC
  Administered 2021-04-30: 500 mL via INTRAVENOUS

## 2021-04-30 MED ORDER — PSYLLIUM 95 % PO PACK: 1.0000 | PACK | ORAL | Status: DC | PRN

## 2021-04-30 MED ORDER — PSYLLIUM 95 % PO PACK
1.0000 | PACK | ORAL | Status: DC | PRN
  Filled 2021-04-30: qty 1

## 2021-04-30 MED ORDER — PSYLLIUM 95 % PO PACK: 1.0000 | PACK | Freq: Every day | ORAL | Status: DC

## 2021-04-30 NOTE — Progress Notes (Addendum)
Subjective: Patient was seen at bedside during rounds this morning. She states that she had a good night overall and denies any pain at this time. She did have an episode of hypoglycemia overnight which resolved after drinking juice and eating crackers, and she says she is ready to eat again today.  Per RN, she did not have a bowel movement overnight. Patient agreed to short-term SNF today. There are no other complaints or concerns at this time.  Objective:  Vital signs in last 24 hours: Vitals:   04/29/21 2000 04/29/21 2037 04/29/21 2344 04/30/21 0400  BP: (!) 105/51 109/84 122/77 104/73  Pulse: 100  (!) 107   Resp: '18  20 20  '$ Temp: 99.1 F (37.3 C)  98.4 F (36.9 C) 98.2 F (36.8 C)  TempSrc: Oral  Oral Oral  SpO2: 100% 96%  95%  Weight:      Height:        General: NAD, thin, elderly female lying comfortably in bed HE: Normocephalic, atraumatic , EOMI, Conjunctivae normal ENT: No congestion, no rhinorrhea, no exudate or erythema, dry mucous membranes Cardiovascular: Normal rate, regular rhythm. No murmurs, rubs, or gallops Pulmonary : Effort normal, breath sounds normal. No wheezes, rales, or rhonchi Abdominal: Soft, nontender,  bowel sounds present Musculoskeletal: Improving left wrist swelling from recent injury without deformity. Aircast intact over the left ankle. No other injury, or tenderness in extremities. Skin: Warm, dry, no bruising, erythema, or rash Psychiatric/Behavioral: normal mood, normal behavior    Assessment/Plan:  Stephanie Horne is a 80 y.o. COVID + female with a PMHx of iron deficiency anemia, AIHA in 2008 on prednisone, alzheimer's dementia, HTN, diastolic heart failure, lupus, T2DM who is here today after sustaining frequent falls over the past few months.  Principal Problem:   Frequent falls Active Problems:   Contusion of left ankle   General weakness   Protein-calorie malnutrition, severe   Frequent falls COVID-positive Severe  malnutrition Patient presents after sustaining frequent falls in the past few weeks. Likely mechanical in nature, in the setting of malnutrition and recent COVID infection. Soft blood pressures this morning, patient received fluid bolus this morning. PT recommends short-term SNF placement to increase her strength and gait training, which patient also agreed to this morning. SNF placement pending in the setting of her COVID infection. -PT recommending short-term SNF placement; pending placement - S/p LR bolus 500 mL -Encourage p.o. intake  Diarrhea, chronic Dark stools The patient's son states that his main concern about his mother is her persistent diarrhea. This has been going on for at least a year per the son, however review of records shows that original work-up for this was in 2012. Work-up in 2012 includes stool ova and cyst, was unremarkable. At that time, also considered checking stool fat if diarrhea did not improve, however diarrhea improved and further work-up was not pursued. Plan was to continue Imodium regimen, however son states that this no longer alleviates her diarrhea. She continues to be afebrile, WBC counts have been stable. Per RN team, patient has not had any diarrhea in the past 2 days. I discussed at length with the son our recommendation to hold off on elective colonoscopy given patient's current condition in a nonemergent setting without evidence of GI bleed. In the outpatient setting, could consider further work-up including fecal elastase, stool culture, etc.  Atrial fibrillation Rate controlled. Continue home Eliquis and metoprolol. -Telemetry -Continue Eliquis 2.5 mg BID -Continue home metoprolol   Autoimmune hemolytic anemia Diagnosed  in 2008 currently on prednisone 7.5 mg daily. Recently discussed increasing prednisone dose in the setting of COVID and mild symptoms however patient's son would like to continue home dose. -Continue prednisone 7.5 mg daily    Alzheimer's dementia Continue home medications memantine and donepezil.   Hypertension Diastolic heart failure Last echocardiogram 2017 showed normal EF and no regional wall abnormalities. Continue home medications of Lasix and metoprolol  Lupus Patient was diagnosed in 2007, stable. Per review of records, she has not been on any medications for multiple years.   Type 2 diabetes Hemoglobin A1c 6.4-year ago. Currently not on any diabetes medications. -CBG monitoring - Discontinued SSI today as patient has had recent hypoglycemic episodes and has questionable food intake   Iron deficiency anemia CBC stable. Continue ferrous sulfate 325 mg daily.  Prior to Admission Living Arrangement: Home with her son Anticipated Discharge Location: TBD Barriers to Discharge: Potential SNF placement with positive COVID infection Dispo: Anticipated discharge in approximately 1-2 day(s).   Orvis Brill, MD 04/30/2021, 6:06 AM Pager: [phone]  After 5pm on weekdays and 1pm on weekends: On Call pager [phone]

## 2021-04-30 NOTE — Progress Notes (Signed)
Patient had blood glucose of 56. Patient drunk  8 oz of orange juice and ate a small amount of food from dinner tray. Recheck 91

## 2021-04-30 NOTE — Progress Notes (Signed)
Inpatient Diabetes Program Recommendations  AACE/ADA: New Consensus Statement on Inpatient Glycemic Control (2015)  Target Ranges:  Prepandial:   less than 140 mg/dL      Peak postprandial:   less than 180 mg/dL (1-2 hours)      Critically ill patients:  140 - 180 mg/dL   Lab Results  Component Value Date   GLUCAP 125 (H) 04/30/2021    Review of Glycemic Control Results for BAILEI, VANDERMOLEN (MRN [id number]) as of 04/30/2021 11:20  Ref. Range 04/29/2021 20:20 04/29/2021 20:41 04/30/2021 07:51  Glucose-Capillary Latest Ref Range: 70 - 99 mg/dL 56 (L) 91 125 (H)   Diabetes history: No DM hx Outpatient Diabetes medications: none Current orders for Inpatient glycemic control: Novolog 0-9 units TID Prednisone 7.5 mg QD  Inpatient Diabetes Program Recommendations:    Noted hypoglycemic episode of 56 mg/dL following Novolog administration.  Per ADA guidelines for DM diagnosis, A1C greater than or equal to 6.5%. A1C last year was 6.4% indicating preDM and at goal for advanced age.   At this time consider, discontinuing SSI and continuing CBG checks.   Thanks, Bronson Curb, MSN, RNC-OB Diabetes Coordinator [phone] (8a-5p)

## 2021-04-30 NOTE — Progress Notes (Signed)
Notified MD on call, Dr Court Joy that Patient is more lethargic tonight than last night. Unable to administer medications due to lethargy. Vitals taken and reported to MD.

## 2021-04-30 NOTE — TOC Progression Note (Addendum)
Transition of Care [facility]) - Progression Note    Patient Details  Name: Stephanie Horne MRN: [id number] Date of Birth: 04/27/1941  Transition of Care [facility]) CM/SW [place], LCSW Phone Number: 04/30/2021, 10:28 AM  Clinical Narrative:    10am-[facility] unable to accept patient until Monday. CSW left message again for [place]. Insurance clinicals submitted, Ref # [id number], pending a facility choice.   2pm-CSW contacted [facility] and was told admissions would not be in the office until Monday. CSW will touch base then as there are no other COVID accepting SNFs at this time.   CSW attempted to notify patient's son that the facility would likely be [name], but he requested CSW call back at another time.    Expected Discharge Plan: Skilled Nursing Facility Barriers to Discharge: Continued Medical Work up, Ship broker, SNF Covid  Expected Discharge Plan and Services Expected Discharge Plan: [place] Choice: Campus arrangements for the past 2 months: Single Family Home                                       Social Determinants of Health (SDOH) Interventions    Readmission Risk Interventions No flowsheet data found.

## 2021-05-01 ENCOUNTER — Encounter (HOSPITAL_COMMUNITY): Payer: Self-pay | Admitting: Internal Medicine

## 2021-05-01 LAB — GLUCOSE, CAPILLARY
Glucose-Capillary: 49 mg/dL — ABNORMAL LOW (ref 70–99)
Glucose-Capillary: 84 mg/dL (ref 70–99)
Glucose-Capillary: 234 mg/dL — ABNORMAL HIGH (ref 70–99)
Glucose-Capillary: 267 mg/dL — ABNORMAL HIGH (ref 70–99)
Glucose-Capillary: 237 mg/dL — ABNORMAL HIGH (ref 70–99)

## 2021-05-01 NOTE — Plan of Care (Signed)

## 2021-05-01 NOTE — Discharge Instructions (Addendum)
Information on my medicine - ELIQUIS (apixaban)  Your dose of Eliquis has changed to the 2.5 mg tablet twice daily.   Why was Eliquis prescribed for you? Eliquis was prescribed for you to reduce the risk of a blood clot forming that can cause a stroke if you have a medical condition called atrial fibrillation (a type of irregular heartbeat).  What do You need to know about Eliquis ? Take your Eliquis TWICE DAILY - one tablet in the morning and one tablet in the evening with or without food. If you have difficulty swallowing the tablet whole please discuss with your pharmacist how to take the medication safely.  Take Eliquis exactly as prescribed by your doctor and DO NOT stop taking Eliquis without talking to the doctor who prescribed the medication.  Stopping may increase your risk of developing a stroke.  Refill your prescription before you run out.  After discharge, you should have regular check-up appointments with your healthcare provider that is prescribing your Eliquis.  In the future your dose may need to be changed if your kidney function or weight changes by a significant amount or as you get older.  What do you do if you miss a dose? If you miss a dose, take it as soon as you remember on the same day and resume taking twice daily.  Do not take more than one dose of ELIQUIS at the same time to make up a missed dose.  Important Safety Information A possible side effect of Eliquis is bleeding. You should call your healthcare provider right away if you experience any of the following: Bleeding from an injury or your nose that does not stop. Unusual colored urine (red or dark brown) or unusual colored stools (red or black). Unusual bruising for unknown reasons. A serious fall or if you hit your head (even if there is no bleeding).  Some medicines may interact with Eliquis and might increase your risk of bleeding or clotting while on Eliquis. To help avoid this, consult your  healthcare provider or pharmacist prior to using any new prescription or non-prescription medications, including herbals, vitamins, non-steroidal anti-inflammatory drugs (NSAIDs) and supplements.  This website has more information on Eliquis (apixaban): http://www.eliquis.com/eliquis/home

## 2021-05-01 NOTE — Progress Notes (Signed)
   Subjective: No acute overnight.patient reports feeling well this morning.  Denies any complaints of nausea, vomiting or abdominal pain.  Discussed that we are waiting for rehab placement.  Objective:  Vital signs in last 24 hours: Vitals:   05/01/21 0000 05/01/21 0400 05/01/21 0726 05/01/21 1116  BP: 91/66 (!) 113/57 130/78 113/74  Pulse: 87 100 (!) 110 98  Resp: '18 15 20 20  '$ Temp: 97.8 F (36.6 C) 97.6 F (36.4 C) 98.7 F (37.1 C) 99.2 F (37.3 C)  TempSrc: Axillary Oral Oral Oral  SpO2: 100% 100% 100% 100%  Weight:      Height:       Physical Exam General: alert, appears stated age, in no acute distress HEENT: Normocephalic, atraumatic, EOM intact, conjunctiva normal CV: Regular rate and rhythm, no murmurs rubs or gallops Pulm: Clear to auscultation bilaterally, normal work of breathing Abdomen: Soft, nondistended, bowel sounds present, no tenderness to palpation MSK: No lower extremity edema Skin: Warm and dry Neuro: Alert and oriented x3   Assessment/Plan:  Principal Problem:   Frequent falls Active Problems:   Contusion of left ankle   General weakness   Protein-calorie malnutrition, severe   Acute encephalopathy   Frequent falls COVID-positive Severe malnutrition Presented after sustaining multiple mechanical falls, presumably in the setting of malnutrition and COVID infection (although mild sxs). PT recommended SNF placement. Patient and family are in agreement. Pending placement, disposition complicated due to covid infection.  -SW on board, appreciate assistance with placement -Dietitian on board for malnutrition, appreciate assistance -Encourage p.o. intake  Diarrhea, chronic Dark stools Has been worked up extensively outpatient.  No diarrhea since patient has been hospitalized.  Patient remains without signs or symptoms of infection.  Would not consider colonoscopy at this time and given patient's age and comorbidities however can have patient  follow-up with [facility] on an outpatient basis -Continue to monitor   Atrial fibrillation Rate controlled. Continue home Eliquis and metoprolol.  Autoimmune hemolytic anemia Diagnosed in 2008 currently on prednisone 7.5 mg daily. Recently discussed increasing prednisone dose in the setting of COVID and mild symptoms however patient's son would like to continue home dose. -Continue prednisone 7.5 mg daily   Alzheimer's dementia Continue home medications memantine and donepezil.   Hypertension Diastolic heart failure Last echocardiogram 2017 showed normal EF and no regional wall abnormalities. Continue home medications of Lasix and metoprolol  Type 2 diabetes Hemoglobin A1c 6.4-year ago. Currently not on any diabetes medications. -CBG monitoring - Discontinued SSI due to low blood sugar and poor oral intake   Iron deficiency anemia CBC stable. Continue ferrous sulfate 325 mg daily.   Prior to Admission Living Arrangement: Home with her son Anticipated Discharge Location: TBD Barriers to Discharge: Potential SNF placement with positive COVID infection   Prior to Admission Living Arrangement: Anticipated Discharge Location: Barriers to Discharge: Dispo: Anticipated discharge pending placement.  Markeshia Giebel N, DO 05/01/2021, 12:35 PM After 5pm on weekdays and 1pm on weekends: On Call pager [phone]

## 2021-05-02 DIAGNOSIS — G9340 Encephalopathy, unspecified: Secondary | ICD-10-CM | POA: Diagnosis present

## 2021-05-02 DIAGNOSIS — I110 Hypertensive heart disease with heart failure: Secondary | ICD-10-CM | POA: Diagnosis present

## 2021-05-02 DIAGNOSIS — Z79899 Other long term (current) drug therapy: Secondary | ICD-10-CM | POA: Diagnosis not present

## 2021-05-02 DIAGNOSIS — F0280 Dementia in other diseases classified elsewhere without behavioral disturbance: Secondary | ICD-10-CM | POA: Diagnosis present

## 2021-05-02 DIAGNOSIS — E43 Unspecified severe protein-calorie malnutrition: Secondary | ICD-10-CM | POA: Diagnosis present

## 2021-05-02 DIAGNOSIS — Z833 Family history of diabetes mellitus: Secondary | ICD-10-CM | POA: Diagnosis not present

## 2021-05-02 DIAGNOSIS — M25532 Pain in left wrist: Secondary | ICD-10-CM | POA: Diagnosis present

## 2021-05-02 DIAGNOSIS — D509 Iron deficiency anemia, unspecified: Secondary | ICD-10-CM | POA: Diagnosis present

## 2021-05-02 DIAGNOSIS — Z681 Body mass index (BMI) 19 or less, adult: Secondary | ICD-10-CM | POA: Diagnosis not present

## 2021-05-02 DIAGNOSIS — R5381 Other malaise: Secondary | ICD-10-CM | POA: Diagnosis present

## 2021-05-02 DIAGNOSIS — W19XXXA Unspecified fall, initial encounter: Secondary | ICD-10-CM | POA: Diagnosis present

## 2021-05-02 DIAGNOSIS — I5032 Chronic diastolic (congestive) heart failure: Secondary | ICD-10-CM | POA: Diagnosis present

## 2021-05-02 DIAGNOSIS — E860 Dehydration: Secondary | ICD-10-CM | POA: Diagnosis present

## 2021-05-02 DIAGNOSIS — Z91048 Other nonmedicinal substance allergy status: Secondary | ICD-10-CM | POA: Diagnosis not present

## 2021-05-02 DIAGNOSIS — M329 Systemic lupus erythematosus, unspecified: Secondary | ICD-10-CM | POA: Diagnosis present

## 2021-05-02 DIAGNOSIS — G309 Alzheimer's disease, unspecified: Secondary | ICD-10-CM | POA: Diagnosis present

## 2021-05-02 DIAGNOSIS — Z7901 Long term (current) use of anticoagulants: Secondary | ICD-10-CM | POA: Diagnosis not present

## 2021-05-02 DIAGNOSIS — K921 Melena: Secondary | ICD-10-CM | POA: Diagnosis present

## 2021-05-02 DIAGNOSIS — U071 COVID-19: Secondary | ICD-10-CM | POA: Diagnosis present

## 2021-05-02 DIAGNOSIS — R531 Weakness: Secondary | ICD-10-CM | POA: Diagnosis present

## 2021-05-02 DIAGNOSIS — E11649 Type 2 diabetes mellitus with hypoglycemia without coma: Secondary | ICD-10-CM | POA: Diagnosis not present

## 2021-05-02 DIAGNOSIS — D591 Autoimmune hemolytic anemia, unspecified: Secondary | ICD-10-CM | POA: Diagnosis present

## 2021-05-02 DIAGNOSIS — M419 Scoliosis, unspecified: Secondary | ICD-10-CM | POA: Diagnosis present

## 2021-05-02 DIAGNOSIS — Z888 Allergy status to other drugs, medicaments and biological substances status: Secondary | ICD-10-CM | POA: Diagnosis not present

## 2021-05-02 DIAGNOSIS — I4891 Unspecified atrial fibrillation: Secondary | ICD-10-CM | POA: Diagnosis present

## 2021-05-02 DIAGNOSIS — R296 Repeated falls: Secondary | ICD-10-CM | POA: Diagnosis present

## 2021-05-02 LAB — CBC
HCT: 28.6 % — ABNORMAL LOW (ref 36.0–46.0)
Hemoglobin: 9.2 g/dL — ABNORMAL LOW (ref 12.0–15.0)
MCH: 31.4 pg (ref 26.0–34.0)
MCHC: 32.2 g/dL (ref 30.0–36.0)
MCV: 97.6 fL (ref 80.0–100.0)
Platelets: 330 10*3/uL (ref 150–400)
RBC: 2.93 MIL/uL — ABNORMAL LOW (ref 3.87–5.11)
RDW: 14.5 % (ref 11.5–15.5)
WBC: 8.1 10*3/uL (ref 4.0–10.5)
nRBC: 0.5 % — ABNORMAL HIGH (ref 0.0–0.2)

## 2021-05-02 LAB — GLUCOSE, CAPILLARY
Glucose-Capillary: 91 mg/dL (ref 70–99)
Glucose-Capillary: 241 mg/dL — ABNORMAL HIGH (ref 70–99)
Glucose-Capillary: 271 mg/dL — ABNORMAL HIGH (ref 70–99)
Glucose-Capillary: 232 mg/dL — ABNORMAL HIGH (ref 70–99)

## 2021-05-02 NOTE — Progress Notes (Signed)
   Subjective: No acute events overnight.  Patient states that she slept "on and off."  She denies any concerns today, however. When asked about her thoughts on discharge to short-term SNF, she is agreeable to this.  However, she also states that she does not need to "get any stronger."  Objective:  Vital signs in last 24 hours: Vitals:   05/02/21 0400 05/02/21 0455 05/02/21 0717 05/02/21 1151  BP: (!) 99/56 100/61 114/71 123/83  Pulse: 90 89 97 99  Resp: (!) '23 20 18 20  '$ Temp: 98.5 F (36.9 C) 98.5 F (36.9 C) 98.7 F (37.1 C) 98.7 F (37.1 C)  TempSrc: Axillary Oral Oral Oral  SpO2: 96% 95% 97% 96%  Weight: 53.1 kg     Height:       Physical Exam General: alert, thin, appears stated age, in no acute distress HEENT: Normocephalic, atraumatic, EOM intact, conjunctiva normal CV: Regular rate and rhythm, no murmurs rubs or gallops Pulm: Clear to auscultation bilaterally, normal work of breathing Abdomen: Soft, nondistended, bowel sounds present, no tenderness to palpation MSK: No lower extremity edema. Air cast intact on the left ankle. Skin: Warm and dry Neuro: Alert and oriented x3   Assessment/Plan:  Principal Problem:   Frequent falls Active Problems:   Contusion of left ankle   General weakness   Protein-calorie malnutrition, severe   Acute encephalopathy  Frequent falls COVID-positive Severe malnutrition Presented after sustaining multiple mechanical falls, presumably in the setting of malnutrition and COVID infection (although mild sxs). PT recommended SNF placement. Patient and family are in agreement. Pending placement, disposition complicated due to covid infection.  -SW on board, appreciate assistance with placement -Dietitian on board for malnutrition, appreciate assistance -Encourage p.o. intake  Diarrhea, chronic Dark stools Has been worked up extensively outpatient.  No diarrhea since patient has been hospitalized.  Patient remains without signs or  symptoms of infection.  Would not consider colonoscopy at this time and given patient's age and comorbidities however can have patient follow-up with [facility] on an outpatient basis -Continue to monitor   Atrial fibrillation Rate controlled. Continue home Eliquis and metoprolol.  Autoimmune hemolytic anemia Diagnosed in 2008 currently on prednisone 7.5 mg daily. Recently discussed increasing prednisone dose in the setting of COVID and mild symptoms however patient's son would like to continue home dose. -Continue prednisone 7.5 mg daily -Continue to monitor COVID symptoms   Alzheimer's dementia Continue home medications memantine and donepezil.   Hypertension Diastolic heart failure Last echocardiogram 2017 showed normal EF and no regional wall abnormalities. Continue home medications of Lasix and metoprolol  Type 2 diabetes Hemoglobin A1c 6.4-year ago. Currently not on any diabetes medications. -CBG monitoring - Discontinued SSI due to low blood sugar and poor oral intake   Iron deficiency anemia CBC stable. Continue ferrous sulfate 325 mg daily.   Prior to Admission Living Arrangement: Home with her son Anticipated Discharge Location: TBD Barriers to Discharge: Potential SNF placement with positive COVID infection Dispo: Anticipated discharge pending placement.  Orvis Brill, MD 05/02/2021, 12:46 PM [phone] After 5pm on weekdays and 1pm on weekends: On Call pager [phone]

## 2021-05-02 NOTE — Plan of Care (Signed)
  Problem: Education: Goal: Knowledge of General Education information will improve Description: Including pain rating scale, medication(s)/side effects and non-pharmacologic comfort measures Outcome: Progressing   Problem: Health Behavior/Discharge Planning: Goal: Ability to manage health-related needs will improve Outcome: Progressing   Problem: Clinical Measurements: Goal: Ability to maintain clinical measurements within normal limits will improve Outcome: Progressing Goal: Will remain free from infection Outcome: Progressing Goal: Diagnostic test results will improve Outcome: Progressing Goal: Respiratory complications will improve Outcome: Progressing Goal: Cardiovascular complication will be avoided Outcome: Progressing   Problem: Activity: Goal: Risk for activity intolerance will decrease Outcome: Progressing   Problem: Nutrition: Goal: Adequate nutrition will be maintained Outcome: Progressing   Problem: Coping: Goal: Level of anxiety will decrease Outcome: Progressing   Problem: Elimination: Goal: Will not experience complications related to bowel motility Outcome: Progressing Goal: Will not experience complications related to urinary retention Outcome: Progressing   Problem: Pain Managment: Goal: General experience of comfort will improve Outcome: Progressing   Problem: Safety: Goal: Ability to remain free from injury will improve Outcome: Progressing   Problem: Skin Integrity: Goal: Risk for impaired skin integrity will decrease Outcome: Progressing   Problem: Education: Goal: Knowledge of risk factors and measures for prevention of condition will improve Outcome: Progressing   Problem: Respiratory: Goal: Will maintain a patent airway Outcome: Progressing Goal: Complications related to the disease process, condition or treatment will be avoided or minimized Outcome: Progressing   Problem: Coping: Goal: Psychosocial and spiritual needs will be  supported Outcome: Progressing

## 2021-05-03 DIAGNOSIS — R296 Repeated falls: Secondary | ICD-10-CM | POA: Diagnosis not present

## 2021-05-03 LAB — GLUCOSE, CAPILLARY
Glucose-Capillary: 118 mg/dL — ABNORMAL HIGH (ref 70–99)
Glucose-Capillary: 281 mg/dL — ABNORMAL HIGH (ref 70–99)
Glucose-Capillary: 211 mg/dL — ABNORMAL HIGH (ref 70–99)

## 2021-05-03 MED ORDER — ENSURE ENLIVE PO LIQD: 237.0000 mL | Freq: Three times a day (TID) | ORAL | 12 refills | Status: AC

## 2021-05-03 MED ORDER — METOPROLOL TARTRATE 50 MG PO TABS: 50.0000 mg | ORAL_TABLET | Freq: Two times a day (BID) | ORAL | 0 refills | Status: DC

## 2021-05-03 MED ORDER — LOPERAMIDE HCL 2 MG PO CAPS: 2.0000 mg | ORAL_CAPSULE | ORAL | 0 refills | Status: AC | PRN

## 2021-05-03 MED ORDER — ADULT MULTIVITAMIN W/MINERALS CH: 1.0000 | ORAL_TABLET | Freq: Every day | ORAL | 0 refills | Status: AC

## 2021-05-03 MED ORDER — APIXABAN 2.5 MG PO TABS: 2.5000 mg | ORAL_TABLET | Freq: Two times a day (BID) | ORAL | Status: DC

## 2021-05-03 NOTE — Progress Notes (Signed)
Report has been given to Armed forces logistics/support/administrative officer at [place].

## 2021-05-03 NOTE — Discharge Summary (Signed)
Name: Stephanie Horne MRN: [id number] DOB: 01-12-1941 80 y.o. PCP: Lucious Groves, DO  Date of Admission: 04/28/2021 11:42 AM Date of Discharge: 05/03/21  Attending Physician: Axel Filler, *  Discharge Diagnosis: Frequent falls, likely mechanical in nature COVID-positive Severe malnutrition   Atrial Fibrillation Autoimmune hemolytic anemia  Alzheimer's dementia Hypertension, diastolic heart failure  Type 2 diabetes  Iron deficiency anemia  Discharge Medications: Allergies as of 05/03/2021   No Known Allergies      Medication List     TAKE these medications    apixaban 2.5 MG Tabs tablet Commonly known as: ELIQUIS Take 1 tablet (2.5 mg total) by mouth 2 (two) times daily. What changed:  medication strength how much to take   donepezil 10 MG tablet Commonly known as: ARICEPT Take 10 mg by mouth at bedtime.   feeding supplement Liqd Take 237 mLs by mouth 3 (three) times daily between meals for 4 days.   ferrous sulfate 325 (65 FE) MG tablet Take 325 mg by mouth daily with breakfast.   furosemide 40 MG tablet Commonly known as: LASIX Take 40 mg by mouth daily.   levocetirizine 5 MG tablet Commonly known as: XYZAL Take 2.5 mg by mouth every evening.   loperamide 2 MG capsule Commonly known as: IMODIUM Take 1 capsule (2 mg total) by mouth as needed for diarrhea or loose stools.   memantine 5 MG tablet Commonly known as: NAMENDA Take 5 mg by mouth daily.   metoprolol tartrate 50 MG tablet Commonly known as: LOPRESSOR Take 1 tablet (50 mg total) by mouth 2 (two) times daily.   mirtazapine 15 MG tablet Commonly known as: REMERON Take 7.5 mg by mouth at bedtime.   multivitamin with minerals Tabs tablet Take 1 tablet by mouth daily. Start taking on: May 04, 2021   omeprazole 20 MG capsule Commonly known as: PRILOSEC Take 20 mg by mouth daily.   potassium chloride SA 20 MEQ tablet Commonly known as: KLOR-CON Take 20 mEq by mouth  daily.   pravastatin 40 MG tablet Commonly known as: PRAVACHOL Take 40 mg by mouth daily.   predniSONE 5 MG tablet Commonly known as: DELTASONE Take 7.5 mg by mouth daily.   traZODone 50 MG tablet Commonly known as: DESYREL Take 50 mg by mouth at bedtime as needed for sleep.   vitamin C 500 MG tablet Commonly known as: ASCORBIC ACID Take 500 mg by mouth daily.   Vitamin D 50 MCG (2000 UT) Caps Take 1 capsule by mouth daily.               Durable Medical Equipment  (From admission, onward)           Start     Ordered   05/03/21 1144  DME standard manual wheelchair with seat cushion  Once       Comments: Patient suffers from physical deconditioning/malnutrition which impairs their ability to perform daily activities like bathing, dressing, feeding, grooming, and toileting in the home.  A walker will not resolve issue with performing activities of daily living. A wheelchair will allow patient to safely perform daily activities. Patient can safely propel the wheelchair in the home or has a caregiver who can provide assistance. Length of need Lifetime. Accessories: elevating leg rests (ELRs), wheel locks, extensions and anti-tippers.   05/03/21 1153            Disposition and follow-up:   StephanieStephanie Horne was discharged from [facility] in  Stable condition.  At the hospital follow up visit please address:  1.Frequent falls, likely mechanical in nature- patient came to hospital due to multiple falls, with progressive weakness and decreased oral intake. She was admitted for monitoring. Workup included imaging, resulting in no acute findings. Patient received PT/OT during hospital stay, and should benefit from short term SNF placement.   2. COVID-positive- patient test positive on admission on 04/28/21, and was saturating well on room air.   3. Severe malnutrition- patient had been eating less prior to admission. She was provided feeding supplements  and should continue to do so to address malnutrition, and encouraging oral food intake.    4. Atrial Fibrillation- the patient had A. Fib RVR, was placed on cardiac monitoring, and rate was controlled with metoprolol. Continue on discharge.   5.Autoimmune hemolytic anemia- patient was diagnosed in 2008 currently on prednisone 7.5 mg daily.  Discussed increasing prednisone dose in the setting of COVID and mild symptoms however patient's son would like to continue home dose, so that was continued during hospital stay. Continue on discharge.   6.Alzheimer's dementia- patient's home medications were continued during her stay.   7.Hypertension, diastolic heart failure- Last echocardiogram 2017 showed normal EF and no regional wall abnormalities. The patient's home medications were continued during her stay. Her BP ranged from 106/63 to 118/77 during admission.   8.Type 2 diabetes- Hemoglobin A1c 6.4-year ago. Was not on any diabetes medications during admission. CBG levels were monitored, and SSI was started.   9.Iron deficiency anemia- CBC levels were stable throughout admission and ferrous sulfate 325 mg was continued daily.   2.  Labs / imaging needed at time of follow-up: none  3.  Pending labs/ test needing follow-up: none   Follow-up Appointments:  Follow-up Information     Lucious Groves, DO. Schedule an appointment as soon as possible for a visit in 1 week(s).   Specialty: Internal Medicine Why: following dischagre from SNF. Contact information: [place] [address] [place] Hospital Course by problem list:  Frequent falls, likely mechanical in nature COVID-positive Severe malnutrition Patient presents after sustaining numerous falls over the past several weeks. History obtained mostly from son who states that she has been falling more frequently, is having progressive weakness and decreased oral intake. Reports at baseline she is  able to get up out of bed on her own and ambulate with the assistance of a walker. States for the past week she has been unable to mobilize on her own. States that she has been eating less however he thinks that this may be due to to her trying to prevent nocturia. Patient only complains of left ankle and wrist pain, trauma imaging was unremarkable. Incidentally found to be COVID-positive, saturating well on room air. Initial recommendation to increase her prednisone to 20 mg for 5 days for her COVID infection.  However son deferred and wanted to continue her home regimen. Given the mild nature of her infection, we continued prednisone 7.5 mg daily, and the patient was hemodynamically stable throughout her hospital stay. The patient was admitted for monitoring, and PT, OT, and nutrition were consulted.  PT recommended short-term SNF placement. Patient continued to agree to short-term SNF placement after multiple discussions with her and her son.   Dark stools The patient's son states that his main concern about his mother's are persistent diarrhea. Per the son  this has been going on for at least a year, however review of records shows that original work-up was done in 2012 work-up at that time included stool ova and cyst, and this was unremarkable. At that time, also considered checking stool that if her diarrhea did not improve, however diarrhea improved and further work-up was not pursued. Plan was to continue Imodium regimen, however son states that this no longer alleviates her diarrhea. She continues to be afebrile, WBC counts have been stable. Per RN team, patient did not have any diarrhea while in the hospital. I discussed at length with the son our recommendation to hold off on elective colonoscopy given patient's current condition in a nonemergent setting without evidence of GI bleed. In the outpatient setting ([facility]), could consider further work-up including fecal elastase, stool culture,  etc.  Atrial fibrillation Rate controlled. Patient is on home Eliquis and metoprolol. Given her age, we decreased her Eliquis regimen from 5 mg twice daily to 2.5 mg twice daily.   Autoimmune hemolytic anemia Diagnosed in 2008 currently on prednisone 7.5 mg daily. Continued home prednisone regimen (see above).    Alzheimer's dementia Continue home medications memantine and donepezil   Hypertension Diastolic heart failure Last echocardiogram 2017 showed normal EF and no regional wall abnormalities. We continued her home Lasix and metoprolol throughout her hospital stay.    Type 2 diabetes Hemoglobin A1c 6.4-year ago.  Patient does not have a home diabetic regimen.  Initially started on sliding scale insulin with CBG monitoring.  However, patient had some recent episodes of hypoglycemia, and she has had poor intake while she has been here. therefore we discontinued her sliding scale insulin and continued CBG monitoring.  Iron deficiency anemia CBC stable. Continued ferrous sulfate 325 mg daily during her hospital stay.   Discharge Exam:   BP 109/64 (BP Location: Right Arm)   Pulse 95   Temp 98.7 F (37.1 C) (Oral)   Resp (!) 22   Ht '5\' 11"'$  (1.803 m)   Wt 53 kg   SpO2 100%   BMI 16.30 kg/m  Discharge exam:  Constitutional: alert, well-appearing, in no acute distress HENT: normocephalic, atraumatic, mucous membranes moist Eyes: conjunctiva non-erythematous, extraocular movements intact Neck: supple Cardiovascular: regular rate and rhythm, no m/r/g Pulmonary/Chest: normal work of breathing on room air, lungs clear to auscultation bilaterally Abdominal: soft, non-tender to palpation, non-distended MSK: normal bulk and tone Neurological: alert & oriented x 3 Skin: warm and dry Psych: normal behavior, normal affect  Pertinent Labs, Studies, and Procedures:  CBC Latest Ref Rng & Units 05/02/2021 04/30/2021 04/29/2021  WBC 4.0 - 10.5 K/uL 8.1 8.0 7.1  Hemoglobin 12.0 - 15.0 g/dL  9.2(L) 9.1(L) 9.4(L)  Hematocrit 36.0 - 46.0 % 28.6(L) 28.0(L) 30.4(L)  Platelets 150 - 400 K/uL 330 242 231   BMP Latest Ref Rng & Units 04/30/2021 04/29/2021 04/28/2021  Glucose 70 - 99 mg/dL 241(H) 135(H) 112(H)  BUN 8 - 23 mg/dL 14 6(L) 6(L)  Creatinine 0.44 - 1.00 mg/dL 0.94 0.75 0.71  Sodium 135 - 145 mmol/L 132(L) 134(L) 135  Potassium 3.5 - 5.1 mmol/L 4.3 4.4 4.2  Chloride 98 - 111 mmol/L 97(L) 103 105  CO2 22 - 32 mmol/L '25 23 23  '$ Calcium 8.9 - 10.3 mg/dL 8.5(L) 8.6(L) 8.9   CBG (last 3)  Recent Labs    05/02/21 2026 05/03/21 0647 05/03/21 1119  GLUCAP 232* 118* 281*   Imaging:  PORTABLE CHEST 1 VIEW Impression: no acute disease.  PORTABLE  PELVIS 1-2 VIEWS No evidence of acute fracture or diastasis on rotated frontal view of the pelvis.  LEFT FOREARM - 2 VIEW 1. No acute findings. 2. Uniform joint space narrowing between the carpal bones with narrowing of the radiocarpal joint. Correlate for any clinical signs or symptoms of inflammatory arthropathy. 3. Chondrocalcinosis noted.  LEFT TIBIA AND FIBULA - 2 VIEW No acute abnormality.  LEFT ANKLE - 2 VIEW No acute abnormality.  LEFT FEMUR 2 VIEWS Negative exam.  CT HEAD WITHOUT CONTRAS/ CT CERVICAL SPINE WITHOUT CONTRAST 1. No acute intracranial abnormality 2. Negative for cervical spine fracture.  Advanced spondylosis.   Discharge Instructions: Discharge Instructions     Call MD for:  difficulty breathing, headache or visual disturbances   Complete by: As directed    Call MD for:  extreme fatigue   Complete by: As directed    Call MD for:  hives   Complete by: As directed    Call MD for:  persistant dizziness or light-headedness   Complete by: As directed    Call MD for:  persistant nausea and vomiting   Complete by: As directed    Call MD for:  temperature >100.4   Complete by: As directed    Diet general   Complete by: As directed    Discharge instructions   Complete by: As directed    Ms.  Horne was admitted to [facility] due to frequent fall, malnutrition, and COVID. She should benefit from short-term SNF placement to increase her strength and gait training. She should continue taking her feeding supplements. She should be encouraged to intake foods by mouth for nutrition. She was found to be in A fib RVR and started on metoprolol, and should continue taking it for rate control. The patient should make a follow up appointment with her PCP provider within a week of discharge from SNF.   Increase activity slowly   Complete by: As directed        Signed: Lajean Manes, MD 05/03/2021, 11:54 AM   Internal Medicine Resident, PGY-1 Pager: [phone]

## 2021-05-03 NOTE — Progress Notes (Signed)
PT Cancellation Note  Patient Details Name: Stephanie Horne MRN: [id number] DOB: [birth year]   Cancelled Treatment:    Reason Eval/Treat Not Completed: Other (comment).  Pt was having her meal and then was discharged.     Ramond Dial 05/03/2021, 2:52 PM  Mee Hives, PT MS Acute Rehab Dept. Number: [place] [facility] [place]

## 2021-05-03 NOTE — TOC Transition Note (Signed)
Transition of Care [facility]) - CM/SW Discharge Note   Patient Details  Name: Stephanie Horne MRN: [id number] Date of Birth: 1941/05/04  [facility]) CM/SW Contact:  Benard Halsted, LCSW Phone Number: 05/03/2021, 1:35 PM   Clinical Narrative:    Patient will DC to: [place] Anticipated DC date: 05/03/21 Family notified: Son, Elenore Rota Transport by: Corey Harold   Per MD patient ready for DC to [place]. RN to call report prior to discharge [phone] Room 808P). RN, patient, patient's family, and facility notified of DC. Discharge Summary and FL2 sent to facility. DC packet on chart. Ambulance transport requested for patient.   CSW will sign off for now as social work intervention is no longer needed. Please consult [place] again if new needs arise.     Final next level of care: Skilled Nursing Facility Barriers to Discharge: Barriers Resolved   Patient Goals and CMS Choice Patient states their goals for this hospitalization and ongoing recovery are:: patient unable to participate in goal setting, only oriented to self CMS Medicare.gov Compare Post Acute Care list provided to:: Patient Represenative (must comment) Choice offered to / list presented to : Adult Children  Discharge Placement   Existing PASRR number confirmed : 05/03/21          Patient chooses bed at: [facility] Patient to be transferred to facility by: [place] Name of family member notified: Son Patient and family notified of of transfer: 05/03/21  Discharge Plan and Services     Post Acute Care Choice: [facility]                               Social Determinants of Health (SDOH) Interventions     Readmission Risk Interventions No flowsheet data found.

## 2021-05-03 NOTE — TOC Progression Note (Signed)
Transition of Care [facility]) - Progression Note    Patient Details  Name: Stephanie Horne MRN: [id number] Date of Birth: Dec 01, 1940  Transition of Care [facility]) CM/SW [place], LCSW Phone Number: 05/03/2021, 10:06 AM  Clinical Narrative:    CSW spoke with patient's son and he is in agreement with plan to discharge to [facility] via [facility].  CSW received insurance approval for [place]: Ref# [id number], effective 04/30/21-05/04/21.    Expected Discharge Plan: [place] Barriers to Discharge: Barriers Resolved  Expected Discharge Plan and Services Expected Discharge Plan: [place] Choice: [place] arrangements for the past 2 months: Single Family Home                                       Social Determinants of Health (SDOH) Interventions    Readmission Risk Interventions No flowsheet data found.

## 2021-05-03 NOTE — Progress Notes (Signed)
[facility] has arrived to transport pt to [facility]. Pt will be transported via stretcher. Pt alert and oriented to person in no acute distress upon discharge. Pt's belongings taken with pt.

## 2021-05-04 ENCOUNTER — Encounter: Payer: Self-pay | Admitting: Internal Medicine

## 2021-05-05 LAB — GLUCOSE, CAPILLARY: Glucose-Capillary: >600 mg/dL (ref 70–99)

## 2021-05-06 ENCOUNTER — Other Ambulatory Visit: Payer: Self-pay | Admitting: *Deleted

## 2021-05-06 DIAGNOSIS — I10 Essential (primary) hypertension: Secondary | ICD-10-CM

## 2021-05-06 MED ORDER — PRAVASTATIN SODIUM 40 MG PO TABS: 40.0000 mg | ORAL_TABLET | Freq: Every day | ORAL | 3 refills | Status: AC

## 2021-05-10 ENCOUNTER — Telehealth: Payer: Self-pay

## 2021-05-10 NOTE — Telephone Encounter (Signed)
  Chronic Care Management   Note  05/10/2021 Name: Stephanie Horne MRN: [id number] DOB: [birth year]  Received a call from patient's son, Elenore Rota who had questions about his Mom's care at [facility], where she is presently located after acute stay.  Per Timmothy Sours, his Mom has developed pressure ulcers and they are infected. He also noted his Mom is very confused and she is generally alert and oriented to her surroundings.  Don  did not realize that her care is being managed by the Medical Director at the facility and physicians at the clinic do not generally treat patients in skilled nursing facilities.  Earl Lagos he should ask to speak to the charge nurse or the director of nursing at the facility to see what the plan is for the patients care/wounds and to please call this RNCM back if he has any other questions or if we can be of any assistance.  Johnney Killian, RN, BSN, CCM Care Management Coordinator [facility] Internal Medicine Phone: [phone] / Fax: [phone]

## 2021-05-12 ENCOUNTER — Telehealth: Payer: Medicare Other

## 2021-05-20 ENCOUNTER — Other Ambulatory Visit (HOSPITAL_COMMUNITY): Payer: Self-pay | Admitting: Allergy and Immunology

## 2021-05-21 ENCOUNTER — Other Ambulatory Visit: Payer: Self-pay | Admitting: Family Medicine

## 2021-05-21 ENCOUNTER — Other Ambulatory Visit (HOSPITAL_COMMUNITY): Payer: Self-pay | Admitting: Family Medicine

## 2021-05-21 DIAGNOSIS — L89150 Pressure ulcer of sacral region, unstageable: Secondary | ICD-10-CM

## 2021-05-31 ENCOUNTER — Ambulatory Visit (HOSPITAL_COMMUNITY): Payer: Medicare Other

## 2021-05-31 ENCOUNTER — Encounter (HOSPITAL_COMMUNITY): Payer: Self-pay

## 2021-06-09 ENCOUNTER — Encounter: Payer: Self-pay | Admitting: Internal Medicine

## 2021-06-09 ENCOUNTER — Other Ambulatory Visit: Payer: Self-pay

## 2021-06-09 ENCOUNTER — Ambulatory Visit (INDEPENDENT_AMBULATORY_CARE_PROVIDER_SITE_OTHER): Payer: Medicare Other | Admitting: Internal Medicine

## 2021-06-09 ENCOUNTER — Other Ambulatory Visit (HOSPITAL_COMMUNITY): Payer: Self-pay

## 2021-06-09 VITALS — BP 102/68 | HR 98 | Temp 97.8°F | Wt 107.0 lb

## 2021-06-09 DIAGNOSIS — M4628 Osteomyelitis of vertebra, sacral and sacrococcygeal region: Secondary | ICD-10-CM | POA: Diagnosis not present

## 2021-06-09 NOTE — Progress Notes (Signed)
[facility] for Infectious Disease  Reason for Consult:Sacral OM  Referring Provider: [facility]   HPI:    Stephanie Horne is a 80 y.o. female with PMHx as below who presents to the clinic for sacral osteomyelitis.   Patient has a known sacral wound and underwent debridement with bone biopsy on 05/25/2021.  Per chart review, on 8/2 her baseline ESR was 101 and CRP 18.  Bone cultures were obtained which had growth of ampicillin susceptible Enterococcus faecalis as well as moderate growth of mixed gram-negative rods.  She has been referred to our clinic from [facility] and rehab where she is currently residing to discuss antibiotic therapy.  She was started on oral Ampicillin starting 06/01/21.  Patient's Medications  New Prescriptions   No medications on file  Previous Medications   APIXABAN (ELIQUIS) 2.5 MG TABS TABLET    Take 1 tablet (2.5 mg total) by mouth 2 (two) times daily.   APIXABAN (ELIQUIS) 5 MG TABS TABLET    Take 1 tablet (5 mg total) by mouth 2 (two) times daily.   CALCIUM CARBONATE-VITAMIN D (CALTRATE 600+D) 600-400 MG-UNIT PER TABLET    Take 1 tablet by mouth 3 (three) times daily with meals.   CHOLECALCIFEROL (VITAMIN D) 50 MCG (2000 UT) CAPS    Take 1 capsule by mouth daily.   DONEPEZIL (ARICEPT) 10 MG TABLET    Take 1 tablet (10 mg total) by mouth at bedtime.   DONEPEZIL (ARICEPT) 10 MG TABLET    Take 10 mg by mouth at bedtime.   FERROUS SULFATE 325 (65 FE) MG TABLET    Take 325 mg by mouth every other day.   FERROUS SULFATE 325 (65 FE) MG TABLET    Take 325 mg by mouth daily with breakfast.   FUROSEMIDE (LASIX) 40 MG TABLET    TAKE 1 TABLET (40 MG TOTAL) BY MOUTH DAILY.   FUROSEMIDE (LASIX) 40 MG TABLET    Take 40 mg by mouth daily.   GLUCOSE BLOOD TEST STRIP    USE TO chech blood sugar UP TO TWICE DAILY   IBUPROFEN (ADVIL) 200 MG TABLET    Take 200 mg by mouth every 6 (six) hours as needed.   LEVOCETIRIZINE (XYZAL) 5 MG TABLET    TAKE  ONE-HALF TABLET BY MOUTH EVERY EVENING   LEVOCETIRIZINE (XYZAL) 5 MG TABLET    Take 2.5 mg by mouth every evening.   LOPERAMIDE HCL (IMODIUM A-D PO)    Take 1 tablet by mouth daily as needed.   MEMANTINE (NAMENDA) 5 MG TABLET    TAKE 1 TABLET BY MOUTH EVERY DAY   MEMANTINE (NAMENDA) 5 MG TABLET    Take 5 mg by mouth daily.   METOPROLOL TARTRATE (LOPRESSOR) 50 MG TABLET    TAKE 1 TABLET (50 MG TOTAL) BY MOUTH 2 (TWO) TIMES DAILY.   METOPROLOL TARTRATE (LOPRESSOR) 50 MG TABLET    Take 1 tablet (50 mg total) by mouth 2 (two) times daily.   MIRTAZAPINE (REMERON) 15 MG TABLET    Take 0.5 tablets (7.5 mg total) by mouth at bedtime.   MIRTAZAPINE (REMERON) 15 MG TABLET    Take 7.5 mg by mouth at bedtime.   NITROGLYCERIN (NITROSTAT) 0.4 MG SL TABLET    Place 1 tablet (0.4 mg total) under the tongue every 5 (five) minutes as needed for chest pain.   OMEPRAZOLE (PRILOSEC) 20 MG CAPSULE    Take 1 capsule (20 mg total)  by mouth daily.   OMEPRAZOLE (PRILOSEC) 20 MG CAPSULE    Take 20 mg by mouth daily.   ONETOUCH DELICA LANCETS 34K MISC    USE TO check UP TO TWICE DAILY E119   PNEUMOVAX 23 25 MCG/0.5ML INJECTION       POLYETHYL GLYCOL-PROPYL GLYCOL (SYSTANE OP)    Place 1 drop into both eyes daily.   POTASSIUM CHLORIDE SA (KLOR-CON) 20 MEQ TABLET    Take 1 tablet (20 mEq total) by mouth daily.   POTASSIUM CHLORIDE SA (KLOR-CON) 20 MEQ TABLET    Take 20 mEq by mouth daily.   PRAVASTATIN (PRAVACHOL) 40 MG TABLET    Take 1 tablet (40 mg total) by mouth daily.   PREDNISONE (DELTASONE) 5 MG TABLET    Take 1.5 tablets (7.5 mg total) by mouth daily.   PREDNISONE (DELTASONE) 5 MG TABLET    Take 7.5 mg by mouth daily.   SODIUM CHLORIDE (OCEAN) 0.65 % SOLN NASAL SPRAY    Place 1 spray into both nostrils as needed for congestion.   TRAZODONE (DESYREL) 50 MG TABLET    Take 1 tablet (50 mg total) by mouth at bedtime as needed for sleep.   TRAZODONE (DESYREL) 50 MG TABLET    Take 50 mg by mouth at bedtime as needed for  sleep.   VITAMIN C (ASCORBIC ACID) 500 MG TABLET    Take 500 mg by mouth daily.  Modified Medications   No medications on file  Discontinued Medications   No medications on file      Past Medical History:  Diagnosis Date   AIHA (autoimmune hemolytic anemia) ([place])    12/08 onset   Atrial fibrillation (HCC)    not on coumidin because AIHA and major bleeding complications   Axillary lymphadenopathy    left negative MMG 1/08   Back pain, chronic    Complicated grief 8/76/8115   Death of husband in 2010-12-18    Diabetes mellitus    Hyperlipidemia    Hypertension    Lupus ([place])    Treated with prednisone indefinitely    Mitral regurgitation    Mitral regurgitation 10/24/2006   2d Echo from 12-19-07 - EF 60%, Moderated MR     Seasonal allergies     Social History   Tobacco Use   Smoking status: Never   Smokeless tobacco: Never  Vaping Use   Vaping Use: Unknown  Substance Use Topics   Alcohol use: No    Alcohol/week: 0.0 standard drinks   Drug use: No    Family History  Problem Relation Age of Onset   Heart disease Mother    Heart disease Sister    Heart disease Brother    Heart disease Brother    Diabetes Daughter    Lupus Daughter     Allergies  Allergen Reactions   Metformin And Related     Diarrhea that was felt to be 2/2 metformin so trial of it was attempted.   Lisinopril     cough   Pollen Extract Other (See Comments)    Congestion, runny nose, phlegm    Review of Systems  Unable to perform ROS: Dementia     OBJECTIVE:    Vitals:   06/09/21 1346  BP: 102/68  Pulse: 98  Temp: 97.8 F (36.6 C)  TempSrc: Oral  SpO2: 96%  Weight: 107 lb (48.5 kg)     Body mass index is 14.92 kg/m.  Physical Exam Constitutional:      General:  She is not in acute distress. HENT:     Head: Normocephalic and atraumatic.  Pulmonary:     Effort: Pulmonary effort is normal. No respiratory distress.  Musculoskeletal:     Comments: See picture of wound.   Skin:     General: Skin is warm and dry.  Neurological:     General: No focal deficit present.     Mental Status: She is alert and oriented to person, place, and time.  Psychiatric:        Mood and Affect: Mood normal.        Behavior: Behavior normal.      Labs and Microbiology:  CBC Latest Ref Rng & Units 05/02/2021 04/30/2021 04/29/2021  WBC 4.0 - 10.5 K/uL 8.1 8.0 7.1  Hemoglobin 12.0 - 15.0 g/dL 9.2(L) 9.1(L) 9.4(L)  Hematocrit 36.0 - 46.0 % 28.6(L) 28.0(L) 30.4(L)  Platelets 150 - 400 K/uL 330 242 231   CMP Latest Ref Rng & Units 04/30/2021 04/29/2021 04/28/2021  Glucose 70 - 99 mg/dL 241(H) 135(H) 112(H)  BUN 8 - 23 mg/dL 14 6(L) 6(L)  Creatinine 0.44 - 1.00 mg/dL 0.94 0.75 0.71  Sodium 135 - 145 mmol/L 132(L) 134(L) 135  Potassium 3.5 - 5.1 mmol/L 4.3 4.4 4.2  Chloride 98 - 111 mmol/L 97(L) 103 105  CO2 22 - 32 mmol/L _0 Calcium 8.9 - 10.3 mg/dL 8.5(L) 8.6(L) 8.9  Total Protein 6.5 - 8.1 g/dL - - 6.6  Total Bilirubin 0.3 - 1.2 mg/dL - - 0.9  Alkaline Phos 38 - 126 U/L - - 56  AST 15 - 41 U/L - - 52(H)  ALT 0 - 44 U/L - - 22       ASSESSMENT & PLAN:    Sacral osteomyelitis (HCC) Patient with sacral pressure ulcer and osteomyelitis confirmed by bone cultures that is polymicrobial with gram-negative rods as well as Enterococcus faecalis growing in culture.  Will have PICC line placed and begin Unasyn 1.5 g every 6 hours to provide GNR & anaerobic coverage in addition to E faecalis which is ampicillin sensitive. Will check baseline labs today with CBC, BMP, ESR, CRP.  She will also need aggressive pressure offloading, continued wound care, further possible debridements, nutrition improvement, glycemic control, and keeping the area free of stool and urine as much as possible.  Plan for 6 weeks duration antibiotics.  RTC 6 weeks.   Orders Placed This Encounter  Procedures   Sedimentation rate   C-reactive protein   CBC w/Diff   COMPLETE METABOLIC PANEL WITH GFR       Raynelle High[facility] for Infectious Disease [facility] 06/09/2021, 2:09 PM  I spent 45 minutes dedicated to the care of this patient on the date of this encounter to include pre-visit review of records, face-to-face time with the patient discussing OM, and post-visit ordering of testing.

## 2021-06-09 NOTE — Patient Instructions (Addendum)
Continue oral Ampicillin until PICC line placed and can start IV antibiotics.  Get PICC line placed.  Routine PICC line care and maintenance.  After PICC placed, start Unasyn 1.5gm every 6 hours for 6 weeks to treat sacral osteomyelitis.  Weekly CBC, CMP, ESR, CRP.  Wound care, pressure off-loading, keep area as clean as possible, nutrition supplementation  Return in 6 weeks

## 2021-06-09 NOTE — Assessment & Plan Note (Signed)
Patient with sacral pressure ulcer and osteomyelitis confirmed by bone cultures that is polymicrobial with gram-negative rods as well as Enterococcus faecalis growing in culture.  Will have PICC line placed and begin Unasyn 1.5 g every 6 hours to provide GNR & anaerobic coverage in addition to E faecalis which is ampicillin sensitive. Will check baseline labs today with CBC, BMP, ESR, CRP.  She will also need aggressive pressure offloading, continued wound care, further possible debridements, nutrition improvement, glycemic control, and keeping the area free of stool and urine as much as possible.  Plan for 6 weeks duration antibiotics.  RTC 6 weeks.

## 2021-06-10 LAB — COMPLETE METABOLIC PANEL WITH GFR
AG Ratio: 0.7 (calc) — ABNORMAL LOW (ref 1.0–2.5)
ALT: 20 U/L (ref 6–29)
AST: 27 U/L (ref 10–35)
Albumin: 2.4 g/dL — ABNORMAL LOW (ref 3.6–5.1)
Alkaline phosphatase (APISO): 87 U/L (ref 37–153)
BUN/Creatinine Ratio: 25 (calc) — ABNORMAL HIGH (ref 6–22)
BUN: 13 mg/dL (ref 7–25)
CO2: 22 mmol/L (ref 20–32)
Calcium: 8.2 mg/dL — ABNORMAL LOW (ref 8.6–10.4)
Chloride: 104 mmol/L (ref 98–110)
Creat: 0.52 mg/dL — ABNORMAL LOW (ref 0.60–0.95)
Globulin: 3.5 g/dL (calc) (ref 1.9–3.7)
Glucose, Bld: 125 mg/dL — ABNORMAL HIGH (ref 65–99)
Potassium: 3.9 mmol/L (ref 3.5–5.3)
Sodium: 136 mmol/L (ref 135–146)
Total Bilirubin: 0.6 mg/dL (ref 0.2–1.2)
Total Protein: 5.9 g/dL — ABNORMAL LOW (ref 6.1–8.1)
eGFR: 93 mL/min/{1.73_m2} (ref >=60)

## 2021-06-10 LAB — CBC WITH DIFFERENTIAL/PLATELET
Absolute Monocytes: 1560 cells/uL — ABNORMAL HIGH (ref 200–950)
Basophils Absolute: 20 cells/uL (ref 0–200)
Basophils Relative: 0.2 %
Eosinophils Absolute: 10 cells/uL — ABNORMAL LOW (ref 15–500)
Eosinophils Relative: 0.1 %
HCT: 28.2 % — ABNORMAL LOW (ref 35.0–45.0)
Hemoglobin: 8.8 g/dL — ABNORMAL LOW (ref 11.7–15.5)
Lymphs Abs: 740 cells/uL — ABNORMAL LOW (ref 850–3900)
MCH: 28.8 pg (ref 27.0–33.0)
MCHC: 31.2 g/dL — ABNORMAL LOW (ref 32.0–36.0)
MCV: 92.2 fL (ref 80.0–100.0)
MPV: 10.6 fL (ref 7.5–12.5)
Monocytes Relative: 15.6 %
Neutro Abs: 7670 cells/uL (ref 1500–7800)
Neutrophils Relative %: 76.7 %
Platelets: 274 10*3/uL (ref 140–400)
RBC: 3.06 10*6/uL — ABNORMAL LOW (ref 3.80–5.10)
RDW: 15 % (ref 11.0–15.0)
Total Lymphocyte: 7.4 %
WBC: 10 10*3/uL (ref 3.8–10.8)

## 2021-06-10 LAB — SEDIMENTATION RATE: Sed Rate: 86 mm/h — ABNORMAL HIGH (ref 0–30)

## 2021-06-10 LAB — C-REACTIVE PROTEIN: CRP: 88.6 mg/L — ABNORMAL HIGH (ref <8.0)

## 2021-06-21 ENCOUNTER — Telehealth: Payer: Medicare Other

## 2021-06-21 ENCOUNTER — Telehealth: Payer: Self-pay

## 2021-06-21 NOTE — Telephone Encounter (Signed)
  Chronic Care Management   Note  06/21/2021 Name: Stephanie Horne MRN: [id number] DOB: 1940/11/26    Follow up plan: Patient remains in skilled nursing facility with no anticipated discharge in the near future.  RNCM to close case at this time and will restart when patient returns home and repeat referral by physician. Johnney Killian, RN, BSN, CCM Care Management Coordinator [facility] Phone: [phone] / Fax: [phone]

## 2021-07-03 ENCOUNTER — Emergency Department (HOSPITAL_COMMUNITY): Payer: Medicare Other

## 2021-07-03 ENCOUNTER — Emergency Department (HOSPITAL_COMMUNITY)
Admission: EM | Admit: 2021-07-03 | Discharge: 2021-08-03 | Disposition: E | Payer: Medicare Other | Attending: Emergency Medicine | Admitting: Emergency Medicine

## 2021-07-03 DIAGNOSIS — I959 Hypotension, unspecified: Secondary | ICD-10-CM | POA: Diagnosis present

## 2021-07-03 DIAGNOSIS — A419 Sepsis, unspecified organism: Secondary | ICD-10-CM | POA: Insufficient documentation

## 2021-07-03 DIAGNOSIS — Z79899 Other long term (current) drug therapy: Secondary | ICD-10-CM | POA: Insufficient documentation

## 2021-07-03 DIAGNOSIS — E119 Type 2 diabetes mellitus without complications: Secondary | ICD-10-CM | POA: Diagnosis not present

## 2021-07-03 DIAGNOSIS — I509 Heart failure, unspecified: Secondary | ICD-10-CM | POA: Insufficient documentation

## 2021-07-03 DIAGNOSIS — U071 COVID-19: Secondary | ICD-10-CM | POA: Diagnosis not present

## 2021-07-03 DIAGNOSIS — R109 Unspecified abdominal pain: Secondary | ICD-10-CM | POA: Insufficient documentation

## 2021-07-03 DIAGNOSIS — I10 Essential (primary) hypertension: Secondary | ICD-10-CM | POA: Diagnosis not present

## 2021-07-03 DIAGNOSIS — I4891 Unspecified atrial fibrillation: Secondary | ICD-10-CM | POA: Diagnosis not present

## 2021-07-03 DIAGNOSIS — G309 Alzheimer's disease, unspecified: Secondary | ICD-10-CM | POA: Insufficient documentation

## 2021-07-03 DIAGNOSIS — I2510 Atherosclerotic heart disease of native coronary artery without angina pectoris: Secondary | ICD-10-CM | POA: Diagnosis not present

## 2021-07-03 DIAGNOSIS — Z7901 Long term (current) use of anticoagulants: Secondary | ICD-10-CM | POA: Insufficient documentation

## 2021-07-03 DIAGNOSIS — I469 Cardiac arrest, cause unspecified: Secondary | ICD-10-CM | POA: Diagnosis not present

## 2021-07-03 DIAGNOSIS — R509 Fever, unspecified: Secondary | ICD-10-CM

## 2021-07-03 DIAGNOSIS — Z794 Long term (current) use of insulin: Secondary | ICD-10-CM | POA: Insufficient documentation

## 2021-07-03 DIAGNOSIS — I50811 Acute right heart failure: Secondary | ICD-10-CM

## 2021-07-03 DIAGNOSIS — R7401 Elevation of levels of liver transaminase levels: Secondary | ICD-10-CM | POA: Diagnosis not present

## 2021-07-03 LAB — CBC WITH DIFFERENTIAL/PLATELET
Abs Immature Granulocytes: 0.05 10*3/uL (ref 0.00–0.07)
Basophils Absolute: 0 10*3/uL (ref 0.0–0.1)
Basophils Relative: 0 %
Eosinophils Absolute: 0 10*3/uL (ref 0.0–0.5)
Eosinophils Relative: 0 %
HCT: 29.2 % — ABNORMAL LOW (ref 36.0–46.0)
Hemoglobin: 8.1 g/dL — ABNORMAL LOW (ref 12.0–15.0)
Immature Granulocytes: 1 %
Lymphocytes Relative: 11 %
Lymphs Abs: 0.7 10*3/uL (ref 0.7–4.0)
MCH: 29.2 pg (ref 26.0–34.0)
MCHC: 27.7 g/dL — ABNORMAL LOW (ref 30.0–36.0)
MCV: 105.4 fL — ABNORMAL HIGH (ref 80.0–100.0)
Monocytes Absolute: 0.7 10*3/uL (ref 0.1–1.0)
Monocytes Relative: 12 %
Neutro Abs: 4.5 10*3/uL (ref 1.7–7.7)
Neutrophils Relative %: 76 %
Platelets: 289 10*3/uL (ref 150–400)
RBC: 2.77 MIL/uL — ABNORMAL LOW (ref 3.87–5.11)
RDW: 21 % — ABNORMAL HIGH (ref 11.5–15.5)
WBC: 6 10*3/uL (ref 4.0–10.5)
nRBC: 4 % — ABNORMAL HIGH (ref 0.0–0.2)

## 2021-07-03 LAB — COMPREHENSIVE METABOLIC PANEL
ALT: 489 U/L — ABNORMAL HIGH (ref 0–44)
AST: 769 U/L — ABNORMAL HIGH (ref 15–41)
Albumin: 2.3 g/dL — ABNORMAL LOW (ref 3.5–5.0)
Alkaline Phosphatase: 161 U/L — ABNORMAL HIGH (ref 38–126)
Anion gap: 9 (ref 5–15)
BUN: 41 mg/dL — ABNORMAL HIGH (ref 8–23)
CO2: 18 mmol/L — ABNORMAL LOW (ref 22–32)
Calcium: 7.9 mg/dL — ABNORMAL LOW (ref 8.9–10.3)
Chloride: 115 mmol/L — ABNORMAL HIGH (ref 98–111)
Creatinine, Ser: 1.05 mg/dL — ABNORMAL HIGH (ref 0.44–1.00)
GFR, Estimated: 53 mL/min — ABNORMAL LOW (ref >60)
Glucose, Bld: 184 mg/dL — ABNORMAL HIGH (ref 70–99)
Potassium: 5.1 mmol/L (ref 3.5–5.1)
Sodium: 142 mmol/L (ref 135–145)
Total Bilirubin: 1.1 mg/dL (ref 0.3–1.2)
Total Protein: 5.9 g/dL — ABNORMAL LOW (ref 6.5–8.1)

## 2021-07-03 LAB — RESP PANEL BY RT-PCR (FLU A&B, COVID) ARPGX2
Influenza A by PCR: NEGATIVE
Influenza B by PCR: NEGATIVE
SARS Coronavirus 2 by RT PCR: POSITIVE — AB

## 2021-07-03 LAB — APTT: aPTT: 40 seconds — ABNORMAL HIGH (ref 24–36)

## 2021-07-03 LAB — LACTIC ACID, PLASMA: Lactic Acid, Venous: 3.6 mmol/L (ref 0.5–1.9)

## 2021-07-03 LAB — PROTIME-INR
INR: 3.2 — ABNORMAL HIGH (ref 0.8–1.2)
Prothrombin Time: 32.6 seconds — ABNORMAL HIGH (ref 11.4–15.2)

## 2021-07-03 MED ORDER — SODIUM CHLORIDE 0.9 % IV SOLN: 2.0000 g | INTRAVENOUS | Status: DC

## 2021-07-03 MED ORDER — LACTATED RINGERS IV BOLUS
1000.0000 mL | Freq: Once | INTRAVENOUS | Status: AC
  Administered 2021-07-03: 1000 mL via INTRAVENOUS

## 2021-07-03 MED ORDER — LACTATED RINGERS IV BOLUS (SEPSIS)
2000.0000 mL | Freq: Once | INTRAVENOUS | Status: AC
  Administered 2021-07-03: 2000 mL via INTRAVENOUS

## 2021-07-03 MED ORDER — VANCOMYCIN HCL IN DEXTROSE 1-5 GM/200ML-% IV SOLN
1000.0000 mg | Freq: Once | INTRAVENOUS | Status: AC
  Administered 2021-07-03: 1000 mg via INTRAVENOUS
  Filled 2021-07-03: qty 200

## 2021-07-03 MED ORDER — ROCURONIUM BROMIDE 50 MG/5ML IV SOLN
INTRAVENOUS | Status: DC | PRN
  Administered 2021-07-03: 70 mg via INTRAVENOUS

## 2021-07-03 MED ORDER — SODIUM BICARBONATE 8.4 % IV SOLN
INTRAVENOUS | Status: DC | PRN
  Administered 2021-07-03: 50 meq via INTRAVENOUS

## 2021-07-03 MED ORDER — METRONIDAZOLE 500 MG/100ML IV SOLN
500.0000 mg | Freq: Once | INTRAVENOUS | Status: AC
  Administered 2021-07-03: 500 mg via INTRAVENOUS
  Filled 2021-07-03: qty 100

## 2021-07-03 MED ORDER — EPINEPHRINE 1 MG/10ML IJ SOSY
PREFILLED_SYRINGE | INTRAMUSCULAR | Status: DC | PRN
  Administered 2021-07-03 (×2): 1 mg via INTRAVENOUS

## 2021-07-03 MED ORDER — VANCOMYCIN HCL 750 MG/150ML IV SOLN: 750.0000 mg | INTRAVENOUS | Status: DC

## 2021-07-03 MED ORDER — LACTATED RINGERS IV SOLN: INTRAVENOUS | Status: DC

## 2021-07-03 MED ORDER — IOHEXOL 350 MG/ML SOLN
100.0000 mL | Freq: Once | INTRAVENOUS | Status: AC | PRN
  Administered 2021-07-03: 100 mL via INTRAVENOUS

## 2021-07-03 MED ORDER — NOREPINEPHRINE 4 MG/250ML-% IV SOLN
0.0000 ug/min | INTRAVENOUS | Status: DC
  Administered 2021-07-03: 5 ug/min via INTRAVENOUS

## 2021-07-03 MED ORDER — EPINEPHRINE 1 MG/10ML IJ SOSY
PREFILLED_SYRINGE | INTRAMUSCULAR | Status: DC | PRN
  Administered 2021-07-03 (×6): 1 mg via INTRAVENOUS

## 2021-07-03 MED ORDER — NOREPINEPHRINE 4 MG/250ML-% IV SOLN
INTRAVENOUS | Status: AC
  Filled 2021-07-03: qty 250

## 2021-07-03 MED ORDER — SODIUM CHLORIDE 0.9 % IV SOLN
2.0000 g | Freq: Once | INTRAVENOUS | Status: AC
  Administered 2021-07-03: 2 g via INTRAVENOUS
  Filled 2021-07-03: qty 2

## 2021-07-04 LAB — URINE CULTURE: Culture: NO GROWTH

## 2021-07-05 LAB — PATHOLOGIST SMEAR REVIEW: Path Review: INCREASED

## 2021-07-08 LAB — CULTURE, BLOOD (ROUTINE X 2)
Culture: NO GROWTH
Culture: NO GROWTH

## 2021-07-20 ENCOUNTER — Ambulatory Visit: Payer: Medicare Other | Admitting: Internal Medicine

## 2021-08-03 NOTE — ED Notes (Signed)
Pt transported to CT ?

## 2021-08-03 NOTE — Progress Notes (Signed)
Pharmacy Antibiotic Note  Stephanie Horne is a 80 y.o. female admitted on July 21, 2021 presenting from facility with AMS and dec O2 sat, concern for sepsis.  Hx of osteomyelitis 2/2 sacral ulcer growing E faecalis on Unasyn (PICC) for planned 6wk tx followed by ID.  Pharmacy has been consulted for vancomycin and cefepime dosing.  Plan: Vancomycin 1000 mg IV x 1, then 750 mg IV q 36h (eAUC 467, Goal AUC 400-550, SCr 1.05) Cefepime 2g IV every 24 hours Monitor renal function, Cx and clinical progression to narrow Vancomycin levels as needed     Temp (24hrs), Avg:100.5 F (38.1 C), Min:100.5 F (38.1 C), Max:100.5 F (38.1 C)  Recent Labs  Lab 07-21-2021 1023  WBC 6.0  CREATININE 1.05*    CrCl cannot be calculated (Unknown ideal weight.).    Allergies  Allergen Reactions   Metformin And Related Other (See Comments)    Diarrhea that was felt to be 2/2 metformin so trial of it was attempted.   Lisinopril Cough   Pollen Extract Other (See Comments)    Congestion, runny nose, phlegm    Bertis Ruddy, PharmD Clinical Pharmacist ED Pharmacist Phone # [phone] 07/21/21 11:24 AM

## 2021-08-03 NOTE — Code Documentation (Signed)
Pt intubated with equal breath sounds per MD

## 2021-08-03 NOTE — ED Notes (Signed)
Failed attempt to collect BC °

## 2021-08-03 NOTE — Code Documentation (Signed)
Patient time of death occurred at 56.

## 2021-08-03 NOTE — Code Documentation (Signed)
Family at beside. Family given emotional support. 

## 2021-08-03 NOTE — ED Notes (Signed)
Pt noted to be becoming bradycardic. RN and Provider in room Pt becames pulseless and unresponsive, CPR started

## 2021-08-03 NOTE — Code Documentation (Signed)
Provider performing a bedside US

## 2021-08-03 NOTE — ED Triage Notes (Signed)
Pt arrived from [facility], staff called because pt had a decreased O2 sat and altered mental status.  Pt low BP for EMS, they gave 700cc NS. Pt has showing afib on monitor   Staff state pt is normally alert and responding, not today was unresponsive and lethargic  Pt has rehab for PT and is getting IV antibiotics through PICC line for osteomyelitis

## 2021-08-03 NOTE — ED Notes (Signed)
Provider informed of pt Blood pressures trending down, plan to start pressor drip.

## 2021-08-03 NOTE — Code Documentation (Signed)
Family at bedside with pt.

## 2021-08-03 NOTE — ED Provider Notes (Signed)
[facility] EMERGENCY DEPARTMENT Provider Note   CSN: [id number] Arrival date & time: 08-01-21  0945     History Chief Complaint  Patient presents with   Altered Mental Status    Stephanie Horne is a 80 y.o. female.  HPI     80 year old female with a history of atrial fibrillation, autoimmune hemolytic anemia, Alzheimer's dementia, hypertension, type 2 diabetes, sacral wound with debridement and bone biopsy May 25, 2021 with bone cultures which showed ampicillin susceptible Enterococcus faecalis as well as moderate growth of mixed gram-negative rods, on Unasyn via PICC line and residing at [facility] and rehab, presents with concern for altered mental status, hypotension.  Daughter reports that yesterday, patient was not quite herself, appeared to be more fatigued, slept through much of the day.  She had not complained of cough, diarrhea, chest pain, abdominal pain, nausea or vomiting but also notes that she does not complain about things.  This morning, the facility called her because she was not responding to them normally.  She was found to be febrile to 100.5 with blood pressures in the 80s.    Past Medical History:  Diagnosis Date   AIHA (autoimmune hemolytic anemia) ([place])    12/08 onset   Atrial fibrillation ([place])    not on coumidin because AIHA and major bleeding complications   Axillary lymphadenopathy    left negative MMG 1/08   Back pain, chronic    Complicated grief 1/75/1025   Death of husband in Jan 16, 2011    Diabetes mellitus    Hyperlipidemia    Hypertension    Lupus ([employer])    Treated with prednisone indefinitely    Mitral regurgitation    Mitral regurgitation 10/24/2006   2d Echo from Jan 16, 2008 - EF 60%, Moderated MR     Seasonal allergies     Patient Active Problem List   Diagnosis Date Noted   Sacral osteomyelitis ([place]) 06/09/2021   Physical deconditioning 05/02/2021   Protein-calorie malnutrition, severe 04/30/2021   Acute  encephalopathy    Contusion of left ankle    General weakness    Frequent falls 04/28/2021   Need for immunization against influenza 07/10/2020   Iron deficiency anemia 03/09/2020   Adult BMI <19 kg/sq m 11/29/2019   Normocytic anemia 11/29/2019   Venous ulcer of ankle, right ([place]) 11/29/2019   Weight loss 05/16/2019   Frequent falls 08/09/2018   Gait instability 05/10/2018   Autoimmune hemolytic anemia ([place]) 01/04/2018   Hearing loss 11/23/2017   Coronary artery disease, non-occlusive 11/23/2017   Alzheimer's dementia without behavioral disturbance ([place]) 11/23/2017   Heart failure due to valvular disease, chronic, diastolic ([place]) 85/27/7824   Abdominal aortic atherosclerosis ([place]) 04/11/2017   Allergic rhinitis 05/07/2015   Pulmonary hypertension ([place]) 10/09/2014   Severe mitral regurgitation 10/09/2014   Osteoporosis of forearm without pathological fracture 05/29/2013   Insomnia 05/29/2013   Subacute cough 02/22/2012   Vitamin D deficiency 06/30/2011   Diarrhea 01/28/2011   Controlled type 2 diabetes mellitus ([place]) 02/27/2008   History of autoimmune hemolytic anemia 10/12/2007   Atrial fibrillation ([name]) 10/24/2006   Essential hypertension 08/02/2006   Low back pain 08/02/2006    Past Surgical History:  Procedure Laterality Date   CARDIAC CATHETERIZATION N/A 01/07/2016   Procedure: Right/Left Heart Cath and Coronary Angiography;  Surgeon: [name], MD;  Location: [facility];  Service: Cardiovascular;  Laterality: N/A;   EYE SURGERY     cataract in both eyes   TEE  WITHOUT CARDIOVERSION N/A 12/09/2015   Procedure: TRANSESOPHAGEAL ECHOCARDIOGRAM (TEE);  Surgeon: [name], MD;  Location: [facility] ENDOSCOPY;  Service: Cardiovascular;  Laterality: N/A;     OB History   No obstetric history on file.     Family History  Problem Relation Age of Onset   Heart disease Mother    Heart disease Sister    Heart disease Brother    Heart disease Brother    Diabetes  Daughter    Lupus Daughter     Social History   Tobacco Use   Smoking status: Never   Smokeless tobacco: Never  Vaping Use   Vaping Use: Unknown  Substance Use Topics   Alcohol use: No    Alcohol/week: 0.0 standard drinks   Drug use: No    Home Medications Prior to Admission medications   Medication Sig Start Date End Date Taking? Authorizing Provider  acetaminophen (TYLENOL) 500 MG tablet Take 1,000 mg by mouth 2 (two) times daily.   Yes [provider]  acetic acid 0.25 % irrigation Apply 1 application topically See admin instructions. Moisten gauze with 30 mls of solution, then apply to bed wound once daily   Yes [provider]  Amino Acids-Protein Hydrolys (PRO-STAT) LIQD Take 30 mLs by mouth 2 (two) times daily between meals.   Yes [provider]  ampicillin-sulbactam (UNASYN) 1.5 (1-0.5) g SOLR injection Inject 1.5 g into the vein every 6 (six) hours.   Yes [provider]  apixaban (ELIQUIS) 2.5 MG TABS tablet Take 2.5 mg by mouth 2 (two) times daily.   Yes [provider]  cholecalciferol (VITAMIN D3) 25 MCG (1000 UNIT) tablet Take 2,000 Units by mouth every morning.   Yes [provider]  docusate sodium (COLACE) 100 MG capsule Take 100 mg by mouth daily as needed (constipation).   Yes [provider]  donepezil (ARICEPT) 10 MG tablet Take 1 tablet (10 mg total) by mouth at bedtime. 12/08/20 12/08/21 Yes Seawell, Jaimie A, DO  ferrous sulfate 325 (65 FE) MG tablet Take 325 mg by mouth every morning.   Yes [provider]  furosemide (LASIX) 20 MG tablet Take 10-20 mg by mouth See admin instructions. Take 1/2 tablet (10 mg) by mouth on Monday, Wednesday, Friday; take 1 tablet (20 mg) on Sunday, Tuesday, Thursday, Saturday.   Yes [provider]  insulin lispro (HUMALOG) 100 UNIT/ML injection Inject 0-14 Units into the skin See admin instructions. Inject 0-14 units subcutaneously once daily per sliding  scale: 71-200 0 units, 201-250 2 units, 251-300 4 units, 301-350 6 units, 351-400 8 units, 401-450 10 units, 451-500 12 units, >500 14 units and recheck in 2 hours, call MD for CBG >350 or <80   Yes [provider]  levocetirizine (XYZAL) 5 MG tablet TAKE ONE-HALF TABLET BY MOUTH EVERY EVENING Patient taking differently: Take 2.5 mg by mouth at bedtime. TAKE ONE-HALF TABLET BY MOUTH EVERY EVENING 05/26/20  Yes Lucious Groves, DO  loperamide (IMODIUM) 2 MG capsule Take 2 mg by mouth daily as needed for diarrhea or loose stools.   Yes [provider]  memantine (NAMENDA) 5 MG tablet TAKE 1 TABLET BY MOUTH EVERY DAY Patient taking differently: Take 5 mg by mouth every morning. 07/22/20  Yes Joni Reining C, DO  METOPROLOL TARTRATE PO Take 37.5 mg by mouth 2 (two) times daily.   Yes [provider]  mirtazapine (REMERON) 7.5 MG tablet Take 7.5 mg by mouth at bedtime. For appetite  stimulant   Yes [provider]  Multiple Vitamins-Minerals (DECUBI-VITE) CAPS Take 1 capsule by mouth at bedtime.   Yes [provider]  NUTRITIONAL SUPPLEMENTS PO Take 240 mLs by mouth 4 (four) times daily. House Supplement   Yes [provider]  omeprazole (PRILOSEC) 20 MG capsule Take 1 capsule (20 mg total) by mouth daily. 04/19/21  Yes Lucious Groves, DO  oxyCODONE (OXY IR/ROXICODONE) 5 MG immediate release tablet Take 2.5 mg by mouth 2 (two) times daily as needed (pain).   Yes [provider]  potassium chloride SA (KLOR-CON) 20 MEQ tablet Take 1 tablet (20 mEq total) by mouth daily. 02/12/21  Yes Lucious Groves, DO  pravastatin (PRAVACHOL) 40 MG tablet Take 1 tablet (40 mg total) by mouth daily. Patient taking differently: Take 40 mg by mouth at bedtime. 05/06/21  Yes Lucious Groves, DO  predniSONE (DELTASONE) 5 MG tablet Take 1.5 tablets (7.5 mg total) by mouth daily. 04/15/21  Yes Angelica Pou, MD  traZODone (DESYREL) 50 MG tablet Take 1 tablet (50 mg  total) by mouth at bedtime as needed for sleep. Patient taking differently: Take 50 mg by mouth at bedtime. 04/19/21  Yes Joni Reining C, DO  glucose blood test strip USE TO chech blood sugar UP TO TWICE DAILY 04/15/19   Lucious Groves, DO  ONETOUCH DELICA LANCETS 94T MISC USE TO check UP TO TWICE DAILY E119 10/23/18   Lucious Groves, DO  Calcium Carbonate-Vitamin D (CALTRATE 600+D PO) Take 600 mg by mouth daily.  02/13/13  [provider]    Allergies    Metformin and related, Lisinopril, and Pollen extract  Review of Systems   Review of Systems  Unable to perform ROS: Mental status change  Constitutional:  Positive for fatigue and fever. Negative for appetite change.  Respiratory:  Negative for cough and shortness of breath.   Cardiovascular:  Negative for chest pain.  Gastrointestinal:  Negative for abdominal pain and vomiting.  Genitourinary:  Negative for dysuria.  Skin:  Negative for rash.  Neurological:  Negative for headaches.   Physical Exam Updated Vital Signs BP (!) 50/41   Pulse (!) 35   Temp (!) 100.5 F (38.1 C) (Rectal)   Resp 14   Ht 5\' 11"  (1.803 m)   Wt 49.9 kg   SpO2 90%   BMI 15.34 kg/m   Physical Exam Vitals and nursing note reviewed.  Constitutional:      General: She is not in acute distress.    Appearance: She is well-developed. She is ill-appearing and toxic-appearing. She is not diaphoretic.  HENT:     Head: Normocephalic and atraumatic.  Eyes:     Conjunctiva/sclera: Conjunctivae normal.  Cardiovascular:     Rate and Rhythm: Tachycardia present. Rhythm irregular.     Heart sounds: Normal heart sounds. No murmur heard.   No friction rub. No gallop.  Pulmonary:     Effort: Pulmonary effort is normal. No respiratory distress.     Breath sounds: Normal breath sounds. No wheezing or rales.  Abdominal:     General: There is no distension.     Palpations: Abdomen is soft.     Tenderness: There is abdominal tenderness (moaning right  abdominal exam, palpation on right, unclear if in response to tenderness here or general discomfort). There is no guarding.  Musculoskeletal:        General: No tenderness.     Cervical back: Normal range of motion.  Skin:  General: Skin is warm and dry.     Findings: No erythema or rash.     Comments: See photo, sacral ulcer, no sign of erythema, no abscess  Neurological:     GCS: GCS eye subscore is 4. GCS verbal subscore is 4. GCS motor subscore is 6.     ED Results / Procedures / Treatments   Labs (all labs ordered are listed, but only abnormal results are displayed) Labs Reviewed  RESP PANEL BY RT-PCR (FLU A&B, COVID) ARPGX2 - Abnormal; Notable for the following components:      Result Value   SARS Coronavirus 2 by RT PCR POSITIVE (*)    All other components within normal limits  LACTIC ACID, PLASMA - Abnormal; Notable for the following components:   Lactic Acid, Venous 3.6 (*)    All other components within normal limits  COMPREHENSIVE METABOLIC PANEL - Abnormal; Notable for the following components:   Chloride 115 (*)    CO2 18 (*)    Glucose, Bld 184 (*)    BUN 41 (*)    Creatinine, Ser 1.05 (*)    Calcium 7.9 (*)    Total Protein 5.9 (*)    Albumin 2.3 (*)    AST 769 (*)    ALT 489 (*)    Alkaline Phosphatase 161 (*)    GFR, Estimated 53 (*)    All other components within normal limits  CBC WITH DIFFERENTIAL/PLATELET - Abnormal; Notable for the following components:   RBC 2.77 (*)    Hemoglobin 8.1 (*)    HCT 29.2 (*)    MCV 105.4 (*)    MCHC 27.7 (*)    RDW 21.0 (*)    nRBC 4.0 (*)    All other components within normal limits  PROTIME-INR - Abnormal; Notable for the following components:   Prothrombin Time 32.6 (*)    INR 3.2 (*)    All other components within normal limits  APTT - Abnormal; Notable for the following components:   aPTT 40 (*)    All other components within normal limits  CULTURE, BLOOD (ROUTINE X 2)  CULTURE, BLOOD (ROUTINE X 2)   URINE CULTURE  LACTIC ACID, PLASMA  URINALYSIS, ROUTINE W REFLEX MICROSCOPIC  PATHOLOGIST SMEAR REVIEW  LIPASE, BLOOD    EKG None  Radiology CT HEAD WO CONTRAST (5MM)  Result Date: 07-12-2021 CLINICAL DATA:  Altered mental status. EXAM: CT HEAD WITHOUT CONTRAST TECHNIQUE: Contiguous axial images were obtained from the base of the skull through the vertex without intravenous contrast. COMPARISON:  CT head dated April 28, 2021. MRI brain dated November 30, 2018. FINDINGS: Brain: No evidence of acute infarction, hemorrhage, hydrocephalus, extra-axial collection or mass lesion/mass effect. Stable atrophy and chronic microvascular ischemic changes. Vascular: Atherosclerotic vascular calcification of the carotid siphons. No hyperdense vessel. Skull: Normal. Negative for fracture or focal lesion. Sinuses/Orbits: No acute finding. Other: None. IMPRESSION: 1. No acute intracranial abnormality. 2. Stable atrophy and chronic microvascular ischemic changes. Electronically Signed   By: Titus Dubin M.D.   On: 2021/07/12 12:35   CT Angio Chest PE W and/or Wo Contrast  Result Date: 2021/07/12 CLINICAL DATA:  PE suspected, high prob EXAM: CT ANGIOGRAPHY CHEST WITH CONTRAST TECHNIQUE: Multidetector CT imaging of the chest was performed using the standard protocol during bolus administration of intravenous contrast. Multiplanar CT image reconstructions and MIPs were obtained to evaluate the vascular anatomy. CONTRAST:  129mL OMNIPAQUE IOHEXOL 350 MG/ML SOLN COMPARISON:  May 2017 FINDINGS: Cardiovascular: Satisfactory opacification of  the pulmonary arteries to the lobar/proximal segmental level. Evaluation of the more distal pulmonary arteries is limited due to contrast bolus and suspected poor cardiac output resulting in some mixing within the pulmonary arteries. No evidence of central pulmonary embolism. The thoracic aorta is normal in caliber. Calcifications of the aortic arch. Massive enlargement of the right  atrium with reflux of contrast material into the IVC, hepatic veins, and humerus neck and chest wall veins. No pericardial effusion. Mediastinum/Nodes: No enlarged mediastinal, hilar, or axillary lymph nodes. Lungs/Pleura: Advanced centrilobular emphysema. Moderate right pleural effusion. Atelectatic left lower lobe. No pneumothorax. No suspicious pulmonary nodules. Musculoskeletal: No aggressive osseous lesions. Upper abdomen: The visualized upper abdomen is unremarkable. Review of the MIP images confirms the above findings. IMPRESSION: 1. No evidence of a large central pulmonary embolism. Evaluation of the more distal pulmonary arteries is limited due to contrast bolus and suspected poor cardiac output. 2. Massive enlargement of the right atrium with reflux of contrast material into the IVC and chest wall veins. Findings are indicative of poor right heart function. 3. Advanced emphysematous changes in the lungs. 4. Atelectatic left lower lobe which is otherwise not well evaluated. 5. Moderate right pleural effusion. These results were called by telephone at the time of interpretation on 2021/07/08 at 12:57 pm to provider [facility] , who verbally acknowledged these results. Electronically Signed   By: Albin Felling M.D.   On: 07-08-2021 13:01   CT ABDOMEN PELVIS W CONTRAST  Result Date: 07-08-2021 CLINICAL DATA:  Altered mental status.  Sepsis. EXAM: CT ABDOMEN AND PELVIS WITH CONTRAST TECHNIQUE: Multidetector CT imaging of the abdomen and pelvis was performed using the standard protocol following bolus administration of intravenous contrast. CONTRAST:  195mL OMNIPAQUE IOHEXOL 350 MG/ML SOLN COMPARISON:  CTA abdomen and pelvis dated Feb 03, 2016. FINDINGS: Lower chest: Severe cardiomegaly. Right greater than left pleural effusions. Hepatobiliary: Heterogeneous enhancement without focal abnormality. Prominent periportal edema. Decompressed gallbladder. No biliary dilatation. Pancreas: Unremarkable. No  pancreatic ductal dilatation or surrounding inflammatory changes. Spleen: Normal in size without focal abnormality. Adrenals/Urinary Tract: Adrenal glands are unremarkable. Kidneys are normal, without renal calculi, focal lesion, or hydronephrosis. Decompressed bladder with irregular wall thickening. Stomach/Bowel: Stomach is within normal limits. Diminutive or absent appendix. No evidence of bowel wall thickening, distention, or inflammatory changes. Left-sided colonic diverticulosis. Vascular/Lymphatic: Aortic atherosclerosis. Dilated right-sided pelvic vein. No enlarged abdominal or pelvic lymph nodes. Reproductive: Uterus and bilateral adnexa are unremarkable. Other: Small ascites.  No pneumoperitoneum. Musculoskeletal: Anasarca. Midline sacral decubitus ulcer extending to bone with a chronic erosion of the sacrococcygeal junction and coccyx. IMPRESSION: 1. Fluid overload. Severe cardiomegaly with right greater than left pleural effusions, small ascites, and anasarca. 2. Heterogeneous enhancement of the liver may be perfusional, but could also reflect acute hepatitis. 3. Irregular bladder wall thickening, presumably related to underdistention. Correlate with urinalysis. 4. Midline sacral decubitus ulcer extending to bone with chronic osteomyelitis of the sacrococcygeal junction and coccyx. 5. Aortic Atherosclerosis (ICD10-I70.0). Electronically Signed   By: Titus Dubin M.D.   On: 07/08/2021 12:57   DG Chest Port 1 View  Result Date: 07-08-2021 CLINICAL DATA:  Questionable sepsis. Decreased O2 sats. Altered mental status. EXAM: PORTABLE CHEST 1 VIEW COMPARISON:  April 28, 2021 FINDINGS: The study is limited due to patient positioning. Prominent skin folds are seen over the lateral right chest. There appear to be lung markings on both sides. Cardiomegaly. The hila and mediastinum are unremarkable. The right PICC line terminates in the SVC. Increased  interstitial markings in the lungs. Stable left  retrocardiac opacity, likely atelectasis. IMPRESSION: 1. Prominent skin folds over the right lateral chest limit evaluation for pneumothorax. When accounting for the skin folds, there is no convincing evidence of pneumothorax. However, either a better positioned PA and lateral chest x-ray or CT scan of the chest are recommended to exclude a right-sided pneumothorax. 2. Cardiomegaly and pulmonary edema.  Small right effusion. 3. No other abnormalities. Electronically Signed   By: Dorise Bullion III M.D.   On: Jul 04, 2021 11:10    Procedures .Critical Care Performed by: Gareth Morgan, MD Authorized by: Gareth Morgan, MD   Critical care provider statement:    Critical care time (minutes):  90   Critical care was time spent personally by me on the following activities:  Discussions with consultants, evaluation of patient's response to treatment, examination of patient, ordering and performing treatments and interventions, ordering and review of laboratory studies, ordering and review of radiographic studies, pulse oximetry, re-evaluation of patient's condition, obtaining history from patient or surrogate and review of old charts Procedure Name: Intubation Date/Time: 07-04-21 4:12 PM Performed by: Gareth Morgan, MD Pre-anesthesia Checklist: Patient identified, Patient being monitored, Emergency Drugs available, Timeout performed and Suction available Oxygen Delivery Method: Non-rebreather mask Preoxygenation: Pre-oxygenation with 100% oxygen Induction Type: Rapid sequence Ventilation: Mask ventilation without difficulty Tube size: 7.0 mm Number of attempts: 1 Placement Confirmation: ETT inserted through vocal cords under direct vision, CO2 detector and Breath sounds checked- equal and bilateral    CPR  Date/Time: 07/04/21 4:15 PM Performed by: Gareth Morgan, MD Authorized by: Gareth Morgan, MD  CPR Procedure Details:    Duration of CPR (minutes):  50   Outcome: Pt declared  dead    CPR performed via ACLS guidelines under my direct supervision.  See RN documentation for details including defibrillator use, medications, doses and timing.   Medications Ordered in ED Medications  lactated ringers infusion (0 mLs Intravenous Stopped 07-04-21 1533)  ceFEPIme (MAXIPIME) 2 g in sodium chloride 0.9 % 100 mL IVPB (has no administration in time range)  vancomycin (VANCOREADY) IVPB 750 mg/150 mL (has no administration in time range)  norepinephrine (LEVOPHED) 4mg  in 231mL premix infusion (0 mcg/min Intravenous Stopped 04-Jul-2021 1436)  norepinephrine (LEVOPHED) 4-5 MG/250ML-% infusion SOLN (has no administration in time range)  rocuronium (ZEMURON) injection (70 mg Intravenous Given 07/04/2021 1244)  EPINEPHrine (ADRENALIN) 1 MG/10ML injection (1 mg Intravenous Given 07/04/2021 1243)  EPINEPHrine (ADRENALIN) 1 MG/10ML injection (1 mg Intravenous Given 07-04-21 1324)  sodium bicarbonate injection (50 mEq Intravenous Given 07-04-21 1247)  lactated ringers bolus 2,000 mL (0 mLs Intravenous Stopped 07/04/2021 1126)  ceFEPIme (MAXIPIME) 2 g in sodium chloride 0.9 % 100 mL IVPB (0 g Intravenous Stopped July 04, 2021 1125)  metroNIDAZOLE (FLAGYL) IVPB 500 mg (0 mg Intravenous Stopped Jul 04, 2021 1534)  vancomycin (VANCOCIN) IVPB 1000 mg/200 mL premix (0 mg Intravenous Stopped July 04, 2021 1535)  lactated ringers bolus 1,000 mL (0 mLs Intravenous Stopped Jul 04, 2021 1535)  iohexol (OMNIPAQUE) 350 MG/ML injection 100 mL (100 mLs Intravenous Contrast Given 2021/07/04 1209)    ED Course  I have reviewed the triage vital signs and the nursing notes.  Pertinent labs & imaging results that were available during my care of the patient were reviewed by me and considered in my medical decision making (see chart for details).    MDM Rules/Calculators/A&P  80 year old female with a history of atrial fibrillation, autoimmune hemolytic anemia, Alzheimer's dementia, hypertension, type 2 diabetes,  sacral wound with debridement and bone biopsy May 25, 2021 with bone cultures which showed ampicillin susceptible Enterococcus faecalis as well as moderate growth of mixed gram-negative rods, on Unasyn via PICC line and residing at [facility] and rehab, presents with concern for altered mental status, hypotension.  Febrile to 100.5 with blood pressures in the 70s on arrival to the emergency department.  Initiated code sepsis with empiric antibiotics.  She received 700 cc of fluid prior to arrival to the emergency department, was given an additional 2 L of fluid with improvement of blood pressures to the 97W systolic, however continued tachycardia.  Ordered additional 1 L of LR given normal saturations, no sign of volume overload on exam, continued BP in 90s with tachycardia to 120s.    Labs returned show no leukocytosis or left shift, mild worsening of her chronic anemia.  Her INR is elevated, however given relative immobility, tachycardia hypotension, mildly elevated temperature, consider other etiology of her presentation such as PE.  Labs are also significant for transaminitis, AKI.  Chest x-ray is limited is recommended to obtain repeat x-ray or CT to evaluate her and exclude right-sided pneumothorax.  Clinically I have low suspicion for this, however we will be ordering CT PE study given PE on differential for her presentation as well.   CT abdomen pelvis ordered given transaminitis, evaluate for sign of common bile duct abnormality or cholangitis, and also reevaluate area of sacral wound for sign of abscess.  After returning from [place], notified by nursing that blood pressures decreased and she is becoming less responsive. Placed order for PCCM and levophed, and as these orders were being placed notified that she is now unresponsive.  Heart rate became bradycardic on my arrival and she was found to be in PEA arrest.  Initiated chest compressions, gave epinephrine, bicarb, and intubated for respiratory  failure.   Discussed CT with radiology after CPR initiated--no PE, signs of right heart failure.   Continued CPR with continued discussions with family. Had brief ROSC with thready carotid pulse however had loss of pulse after this brief period and CPR was resumed.  Had CPR for 50 minutes total and PEA transitioning to asystole on recheck and discussed again with family concern for no improvement despite prolonged resuscitation.    She was surrounded by loving family members, CPR stopped just prior to their arrival to the room, with time of death 1:33PM.    COVID test also later returned positive, had been positive end of July, unknown if she had previous negative tests and this represents new infection. Attempted to call [facility] however unable to get in touch with facility. Notified ME, she is not an ME case.    Final Clinical Impression(s) / ED Diagnoses Final diagnoses:  Sepsis, due to unspecified organism, unspecified whether acute organ dysfunction present ([place])  Fever, unspecified fever cause  Transaminitis  COVID-19  Acute right-sided heart failure [facility] [place])  Cardiac arrest [facility])    Rx / DC Orders ED Discharge Orders     None        Gareth Morgan, MD July 24, 2021 1619

## 2021-08-03 DEATH — deceased

## 2021-08-05 ENCOUNTER — Encounter: Payer: Medicare Other | Admitting: Internal Medicine

## 2021-09-10 ENCOUNTER — Telehealth: Payer: Self-pay

## 2021-09-10 NOTE — Telephone Encounter (Signed)
Son called requesting documentation from September visit.  Advised him to come to the office to sign release for this record.
